# Patient Record
Sex: Female | Born: 1945 | Race: White | Hispanic: No | Marital: Married | Smoking: Former smoker
Health system: Southern US, Community
[De-identification: ages and names within clinical notes are randomized; demographics above are authoritative.]

## PROBLEM LIST (undated history)

## (undated) DIAGNOSIS — E785 Hyperlipidemia, unspecified: Secondary | ICD-10-CM

## (undated) DIAGNOSIS — N8110 Cystocele, unspecified: Secondary | ICD-10-CM

## (undated) DIAGNOSIS — Z860100 Personal history of colon polyps, unspecified: Secondary | ICD-10-CM

## (undated) DIAGNOSIS — Z87442 Personal history of urinary calculi: Secondary | ICD-10-CM

## (undated) DIAGNOSIS — G43109 Migraine with aura, not intractable, without status migrainosus: Secondary | ICD-10-CM

## (undated) DIAGNOSIS — Z8619 Personal history of other infectious and parasitic diseases: Secondary | ICD-10-CM

## (undated) DIAGNOSIS — H269 Unspecified cataract: Secondary | ICD-10-CM

## (undated) DIAGNOSIS — C801 Malignant (primary) neoplasm, unspecified: Secondary | ICD-10-CM

## (undated) DIAGNOSIS — F419 Anxiety disorder, unspecified: Secondary | ICD-10-CM

## (undated) DIAGNOSIS — K5790 Diverticulosis of intestine, part unspecified, without perforation or abscess without bleeding: Secondary | ICD-10-CM

## (undated) DIAGNOSIS — M8580 Other specified disorders of bone density and structure, unspecified site: Secondary | ICD-10-CM

## (undated) DIAGNOSIS — M810 Age-related osteoporosis without current pathological fracture: Secondary | ICD-10-CM

## (undated) DIAGNOSIS — T7840XA Allergy, unspecified, initial encounter: Secondary | ICD-10-CM

## (undated) HISTORY — PX: TUBAL LIGATION: SHX77

## (undated) HISTORY — PX: LUNG LOBECTOMY: SHX167

## (undated) HISTORY — DX: Hyperlipidemia, unspecified: E78.5

## (undated) HISTORY — DX: Personal history of other infectious and parasitic diseases: Z86.19

## (undated) HISTORY — DX: Migraine with aura, not intractable, without status migrainosus: G43.109

## (undated) HISTORY — PX: EYE SURGERY: SHX253

## (undated) HISTORY — DX: Anxiety disorder, unspecified: F41.9

## (undated) HISTORY — DX: Malignant (primary) neoplasm, unspecified: C80.1

## (undated) HISTORY — DX: Age-related osteoporosis without current pathological fracture: M81.0

## (undated) HISTORY — PX: ABDOMINAL HYSTERECTOMY: SHX81

## (undated) HISTORY — PX: FRACTURE SURGERY: SHX138

## (undated) HISTORY — DX: Other specified disorders of bone density and structure, unspecified site: M85.80

## (undated) HISTORY — PX: BREAST BIOPSY: SHX20

## (undated) HISTORY — DX: Unspecified cataract: H26.9

## (undated) HISTORY — DX: Allergy, unspecified, initial encounter: T78.40XA

---
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
## 1986-06-22 HISTORY — PX: OTHER SURGICAL HISTORY: SHX169

## 1997-12-13 ENCOUNTER — Encounter: Payer: Self-pay | Admitting: Family Medicine

## 2002-09-07 IMAGING — MG UNKNOWN MG STUDY
1 series · 5 of 5 positions shown · non-contrast
Comparison: none

REASON FOR EXAM: screening

Procedure: BILATERAL DIGITAL SCREENING MAMMOGRAPHY WITH CAD
 No dominant masses or pathologic clustered calcifications are demonstrated.
Parenchymal asymmetries are present and are stable.

[Series 5261: R CC · right · 5 of 5 slices shown]
[im 1/5]
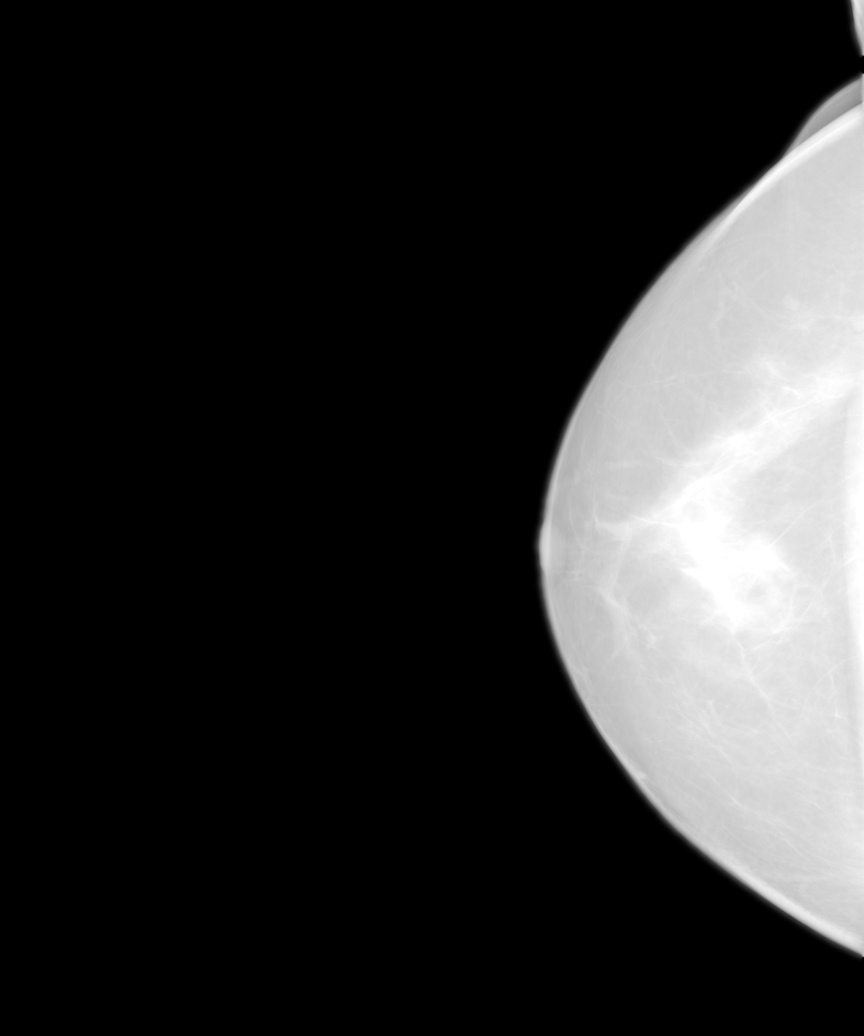
[im 2/5]
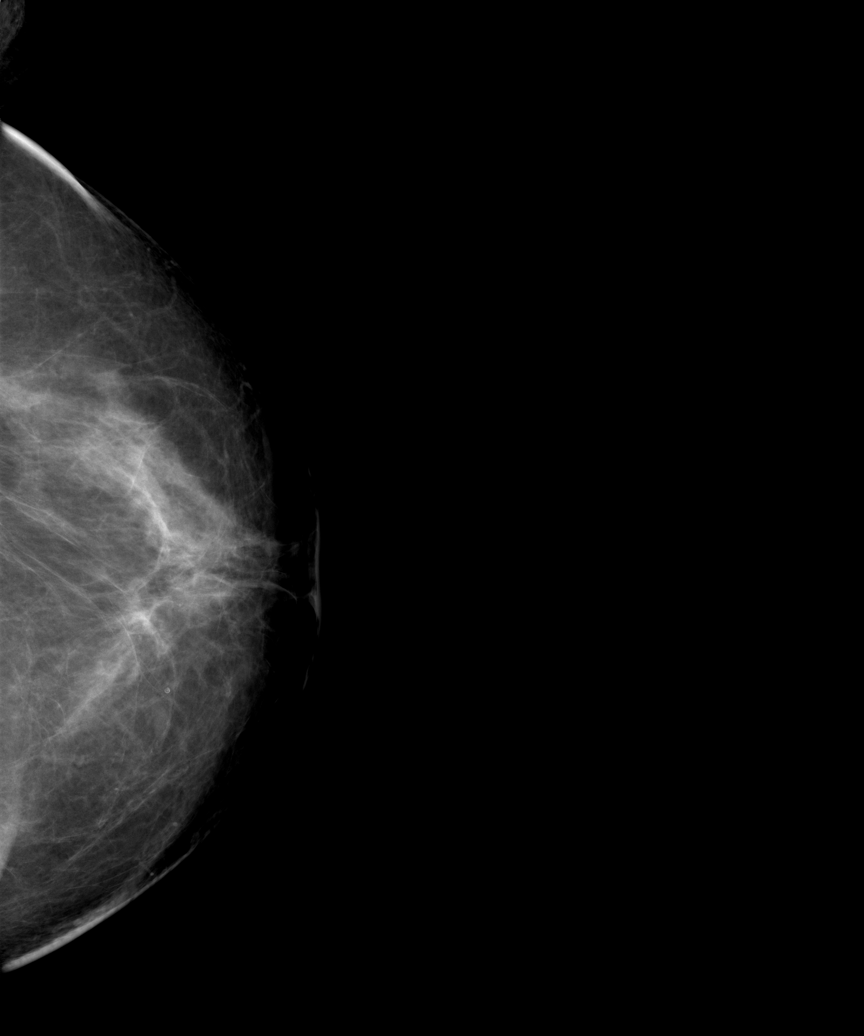
[im 3/5]
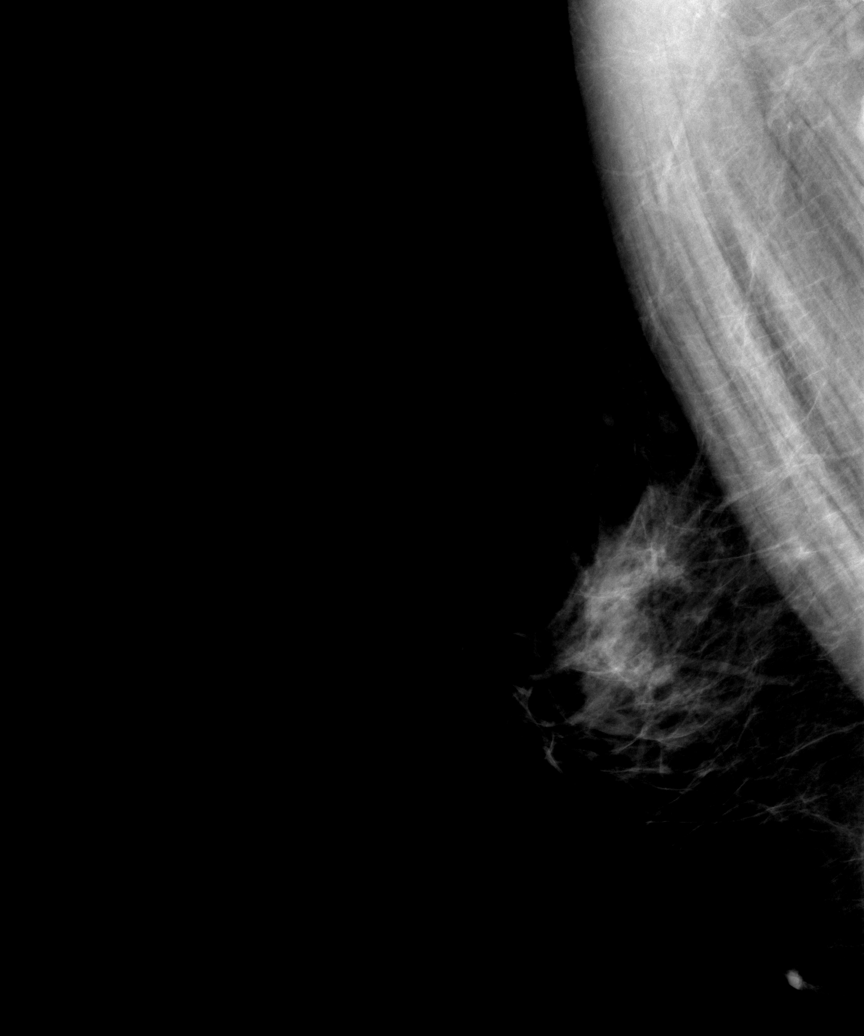
[im 4/5]
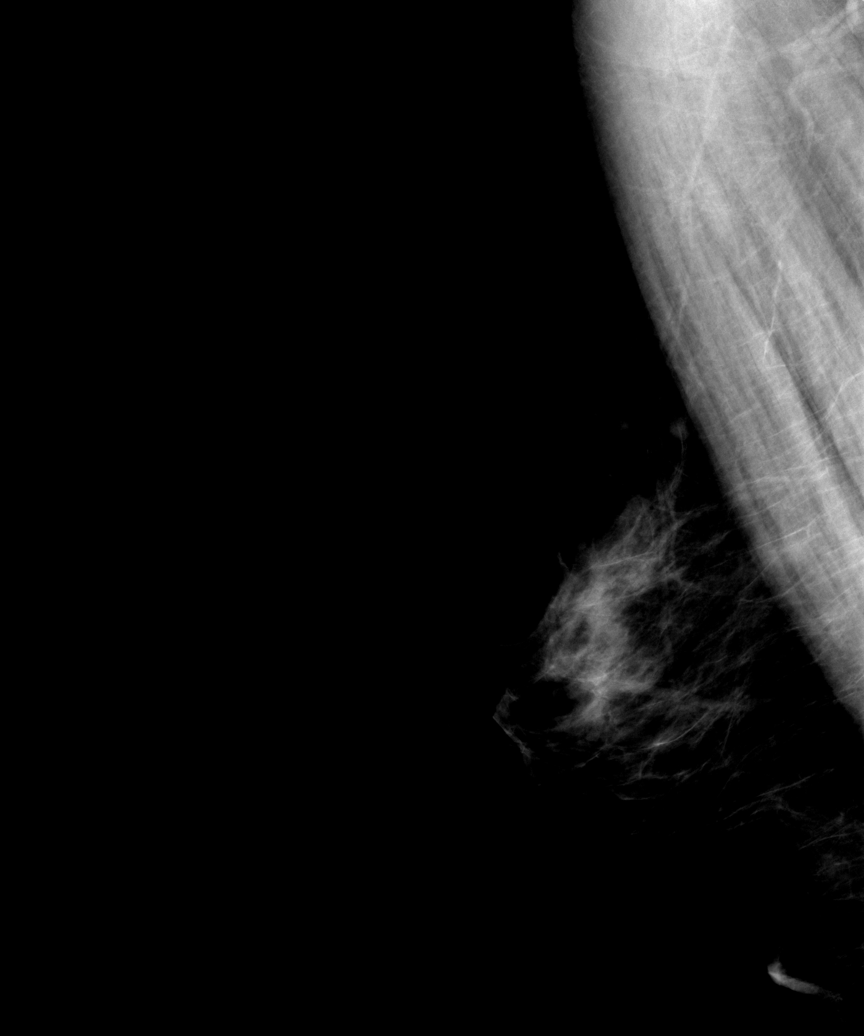
[im 5/5]
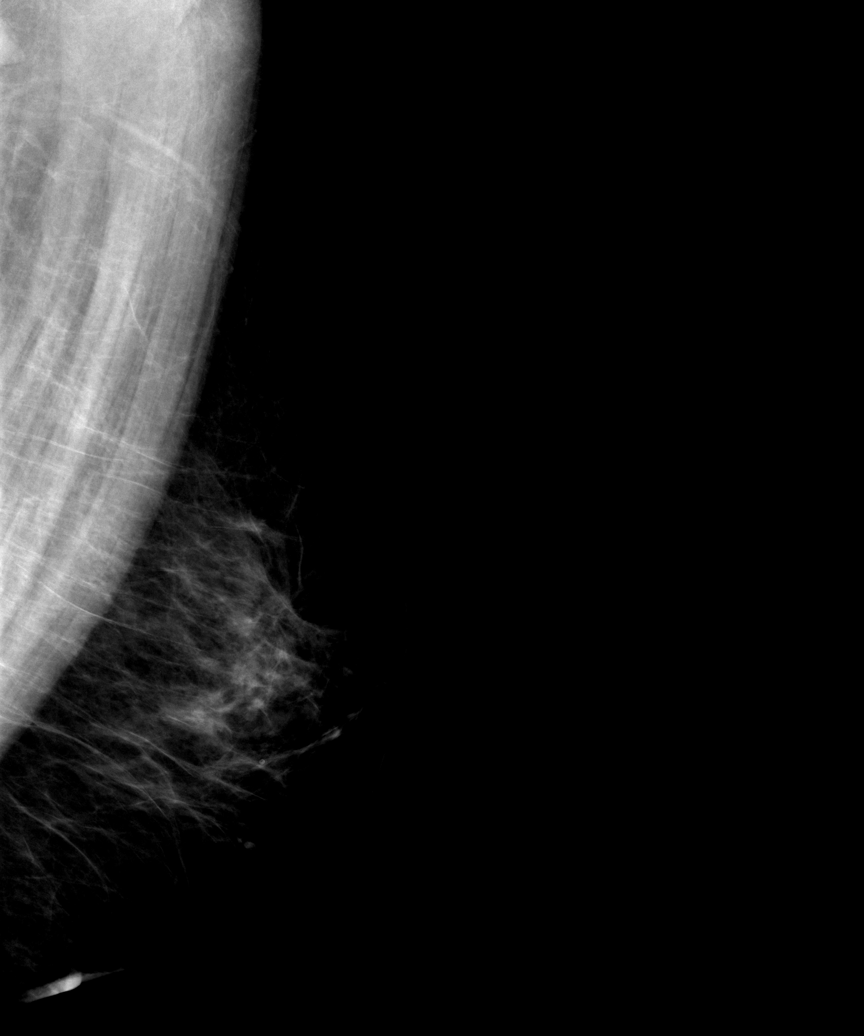

[5 of 5 positions shown; findings below may reference images not displayed]

IMPRESSION: Stable, benign exam. Yearly follow-up mammograms are suggested.

 BI-RADS: Category 2 - Benign Imaging Findings

 A NEGATIVE MAMMOGRAM REPORT DOES NOT PRECLUDE BIOPSY OR OTHER EVALUATION OF
CLINICALLY PALPABLE OR OTHERWISE SUSPICIOUS MASS OR LESION. BREAST CANCER
MAY NOT BE DETECTED BY MAMMOGRAPHY IN UP TO 10% OF CASES.

## 2003-11-23 IMAGING — MG UNKNOWN MG STUDY
1 series · 5 of 5 positions shown · non-contrast
Comparison: none

REASON FOR EXAM: Screening

Procedure: DIGITAL SCREENING MAMMOGRAM WITH CAD:
 No dominant masses or pathologic clustered calcifications demonstrated.
Nodularity noted in the RIGHT breast is stable. Parenchymal asymmetries are
stable.

[R CC · right · 5 of 5 slices shown]
[im 1/5]
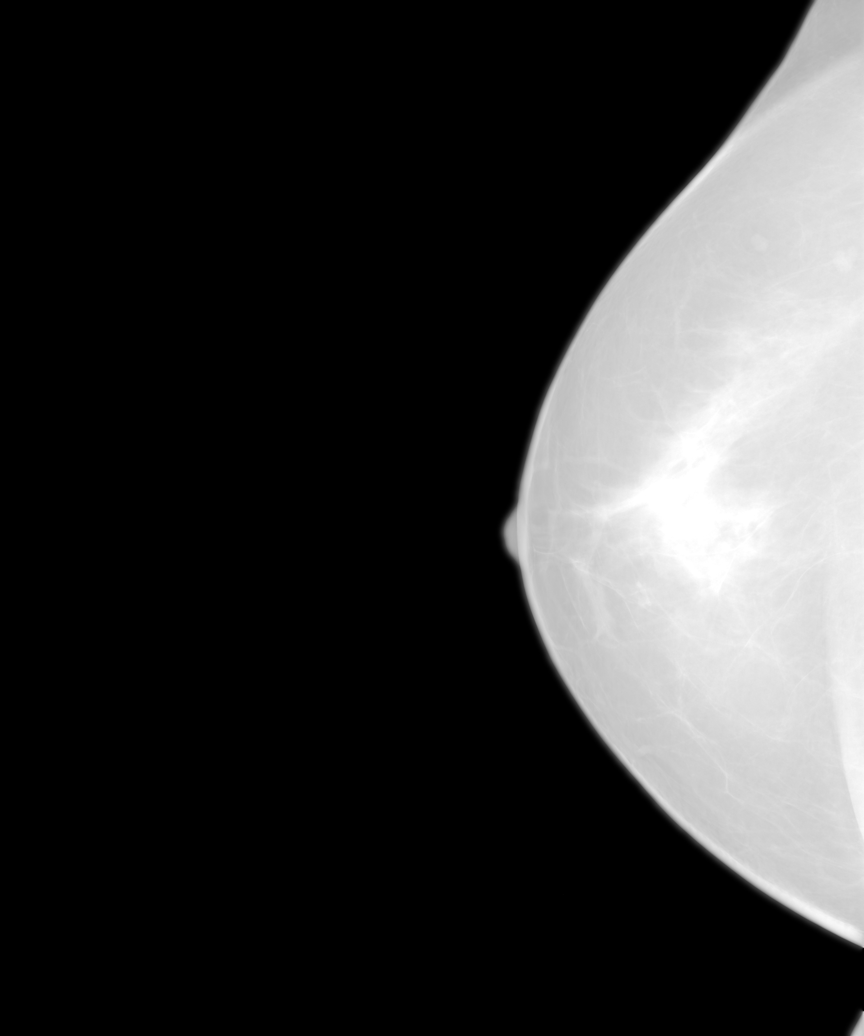
[im 2/5]
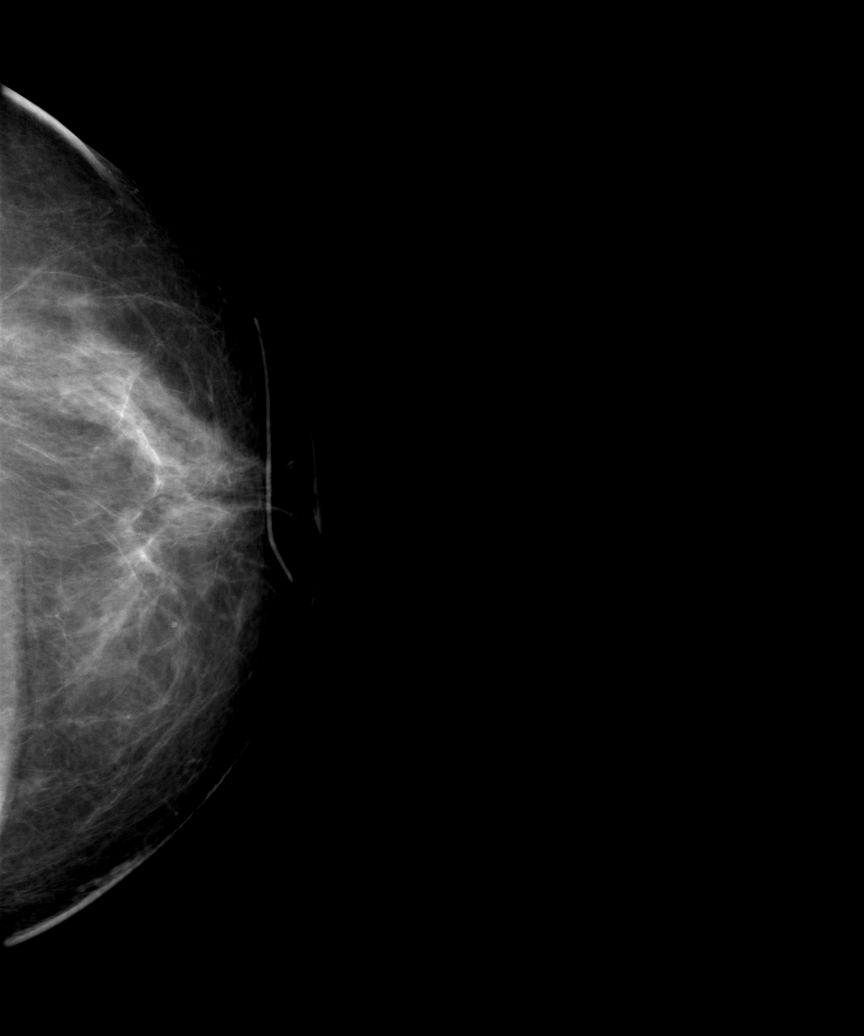
[im 3/5]
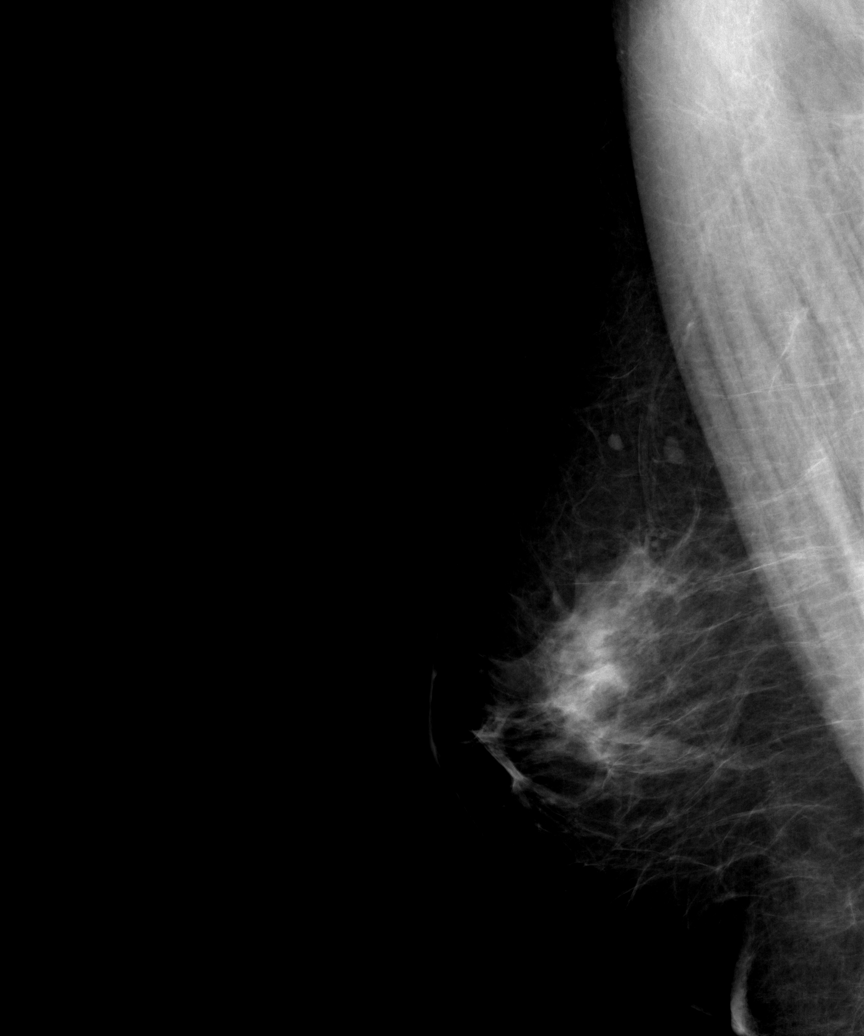
[im 4/5]
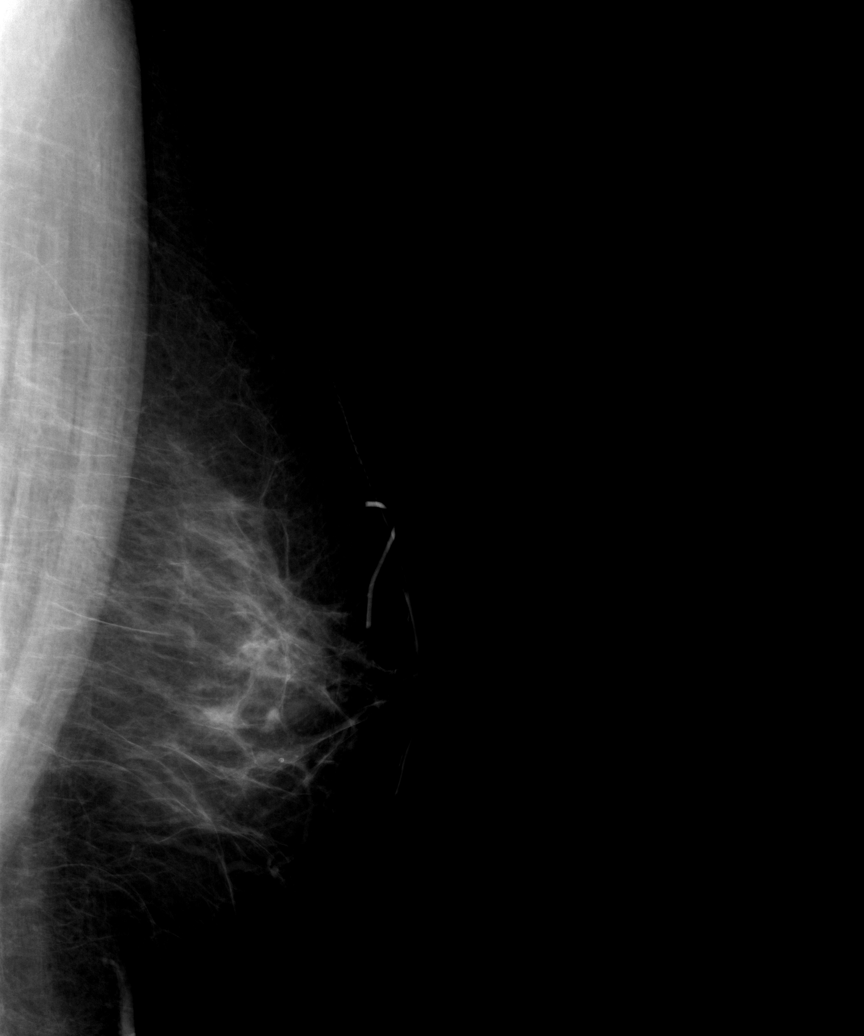
[im 5/5]
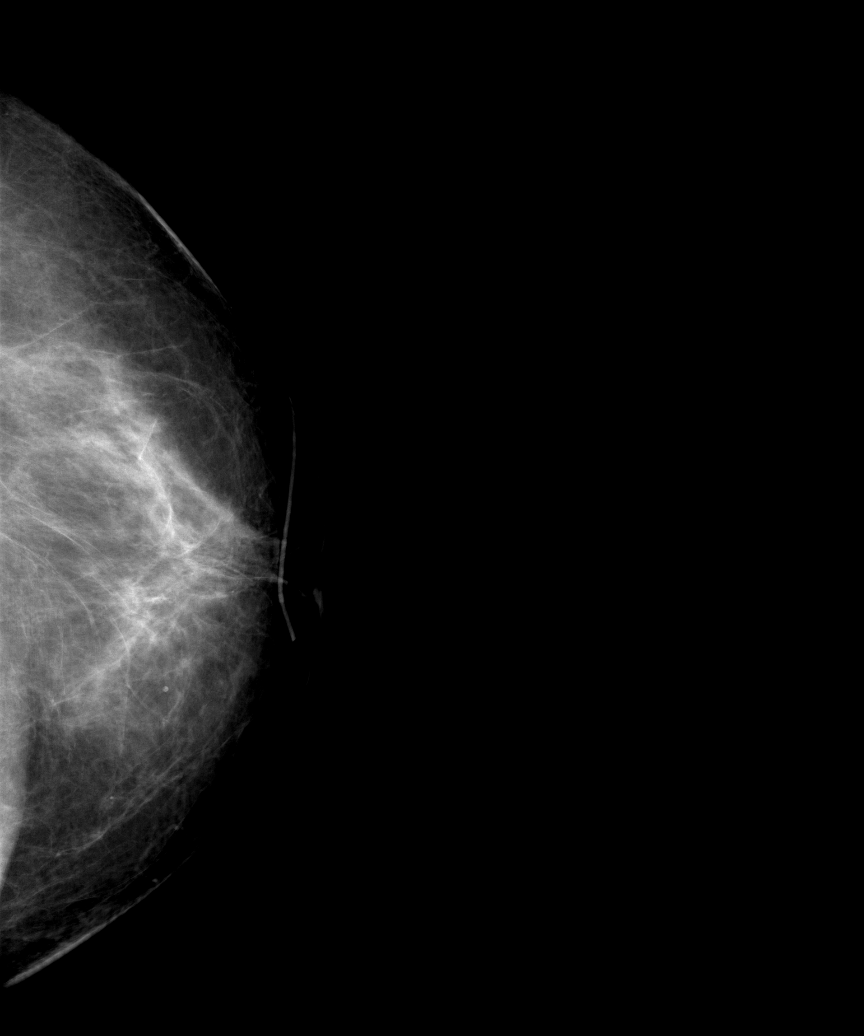

[5 of 5 positions shown; findings below may reference images not displayed]

IMPRESSION: 1)Stable, benign exam. Yearly follow-up mammogram suggested.

 BI-RADS: Category 2-Benign Finding

 A NEGATIVE MAMMOGRAM REPORT DOES NOT PRECLUDE BIOPSY OR OTHER EVALUATION OF
A CLINICALLY PALPABLE OR OTHERWISE SUSPICIOUS MASS OR LESION. BREAST CANCER
MAY NOT BE DETECTED BY MAMMOGRAPHY IN UP TO 10% OF CASES.

## 2004-09-04 ENCOUNTER — Ambulatory Visit: Payer: Self-pay | Admitting: Family Medicine

## 2005-02-12 ENCOUNTER — Ambulatory Visit: Payer: Self-pay | Admitting: Family Medicine

## 2005-02-12 LAB — CONVERTED CEMR LAB: TSH: 1.38 microintl units/mL

## 2005-02-16 ENCOUNTER — Ambulatory Visit: Payer: Self-pay | Admitting: Family Medicine

## 2005-02-16 ENCOUNTER — Other Ambulatory Visit: Admission: RE | Admit: 2005-02-16 | Discharge: 2005-02-16 | Payer: Self-pay | Admitting: Family Medicine

## 2005-02-16 ENCOUNTER — Encounter: Payer: Self-pay | Admitting: Family Medicine

## 2005-02-16 LAB — CONVERTED CEMR LAB: Pap Smear: NORMAL

## 2005-03-10 ENCOUNTER — Ambulatory Visit: Payer: Self-pay | Admitting: Family Medicine

## 2005-03-31 ENCOUNTER — Ambulatory Visit: Payer: Self-pay | Admitting: Family Medicine

## 2005-04-07 ENCOUNTER — Ambulatory Visit: Payer: Self-pay | Admitting: Family Medicine

## 2005-04-07 IMAGING — MG UNKNOWN MG STUDY
1 series · 4 of 4 positions shown · non-contrast
Comparison: none

REASON FOR EXAM: SCREENING MAMMO
COMMENTS:

[R CC · right · 4 of 4 slices shown]
[im 1/4]
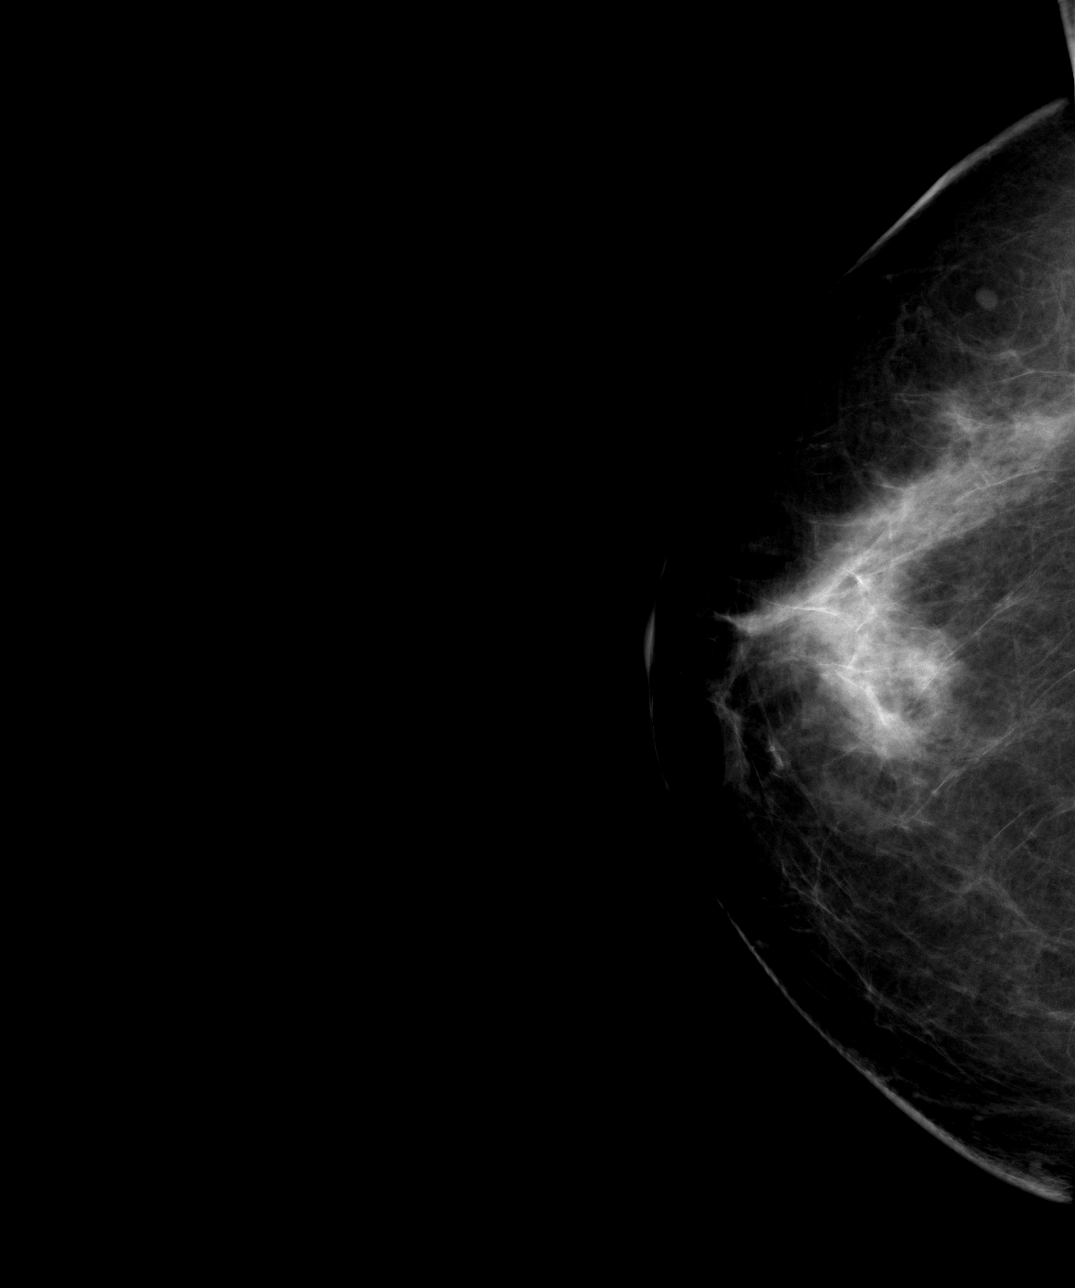
[im 2/4]
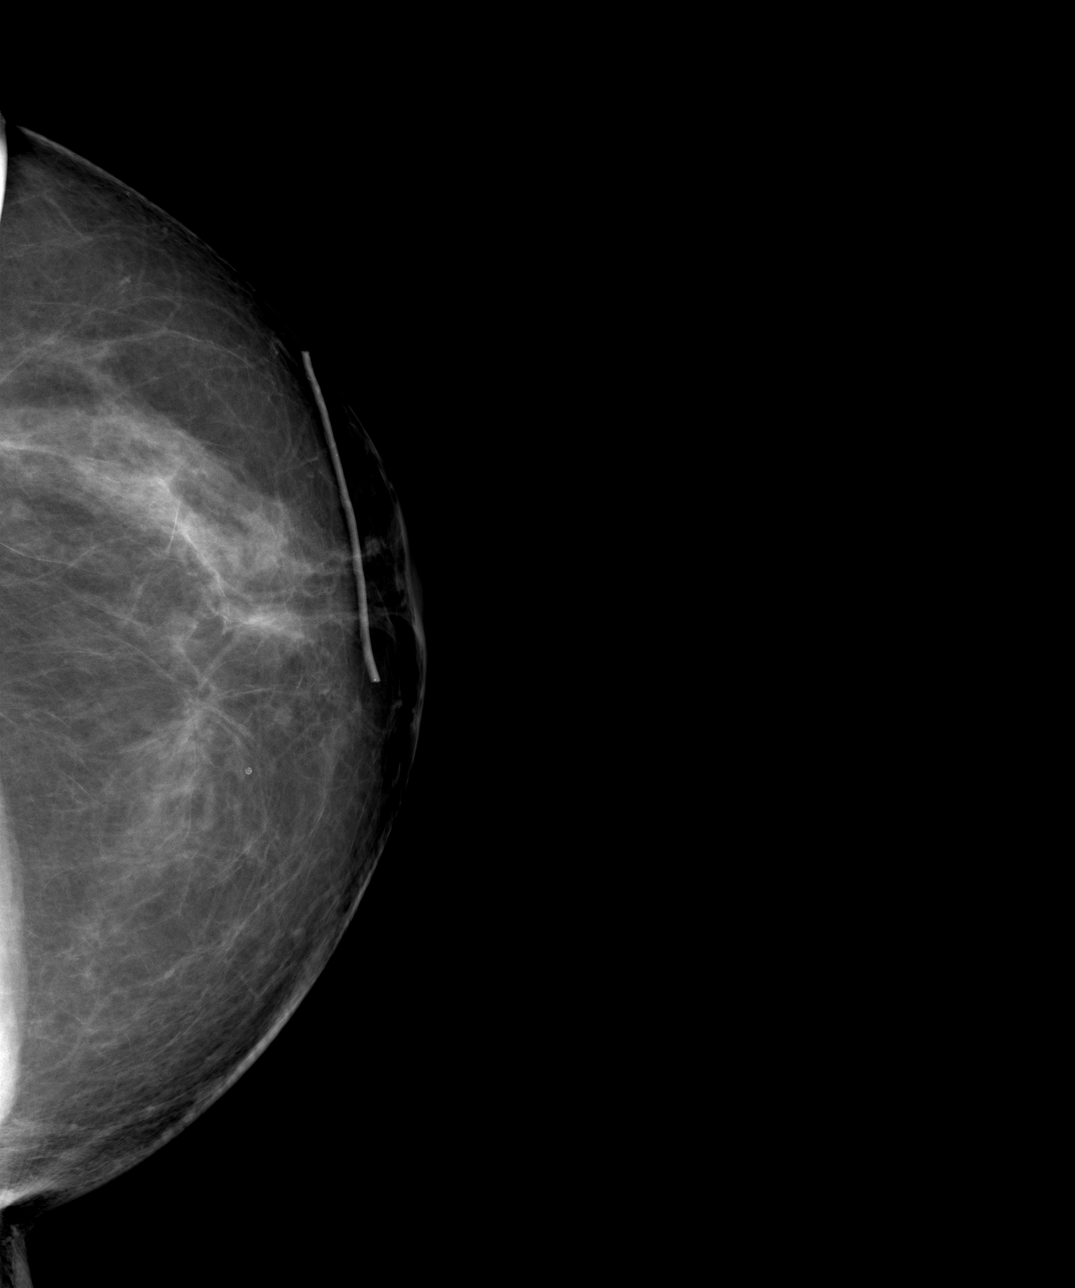
[im 3/4]
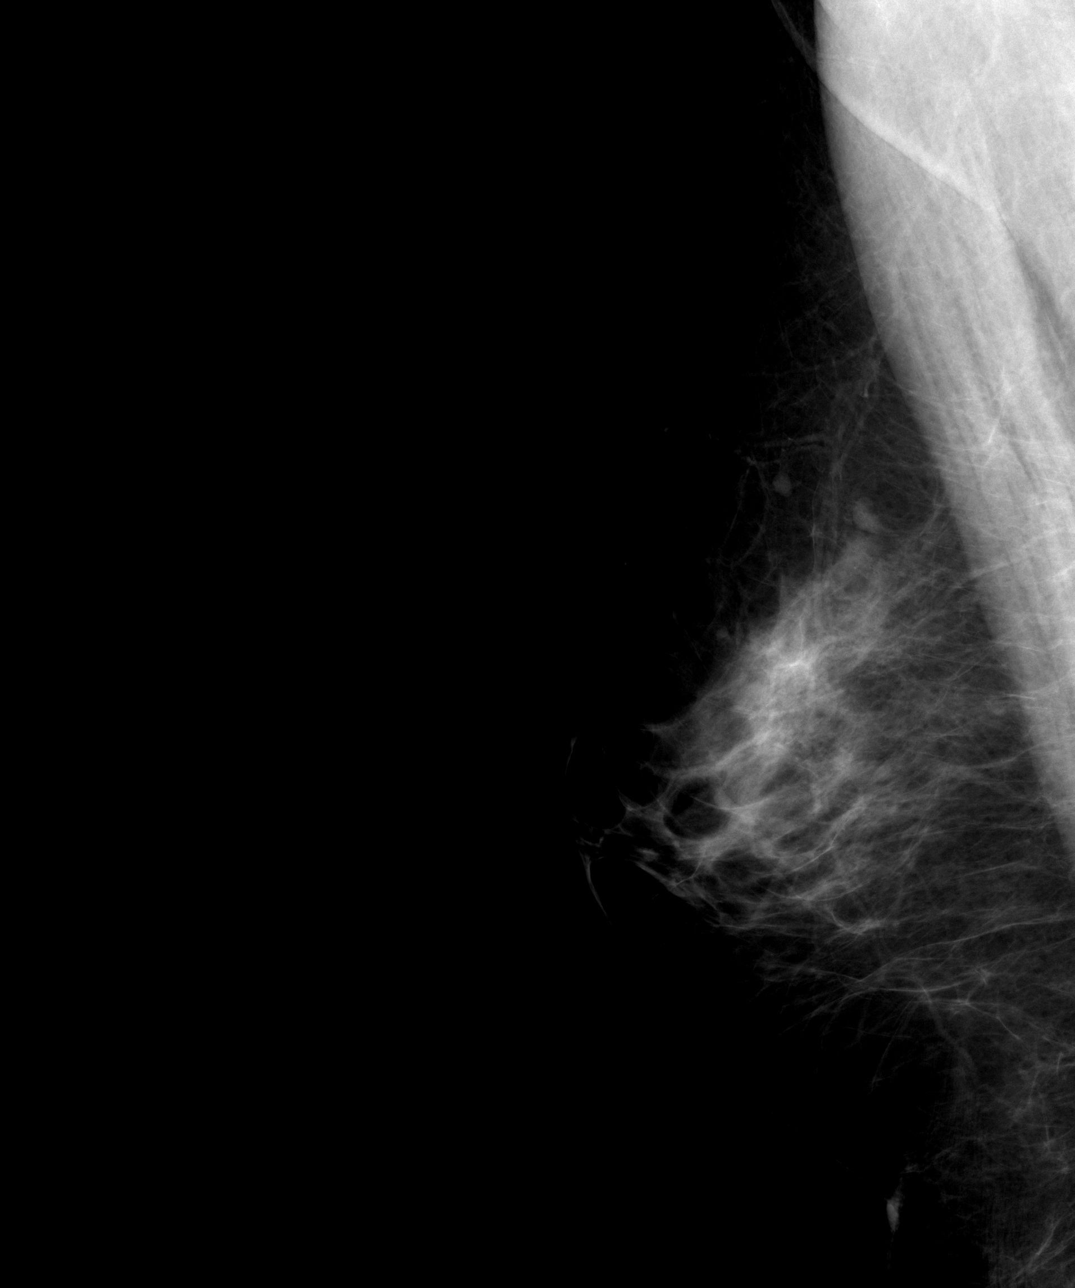
[im 4/4]
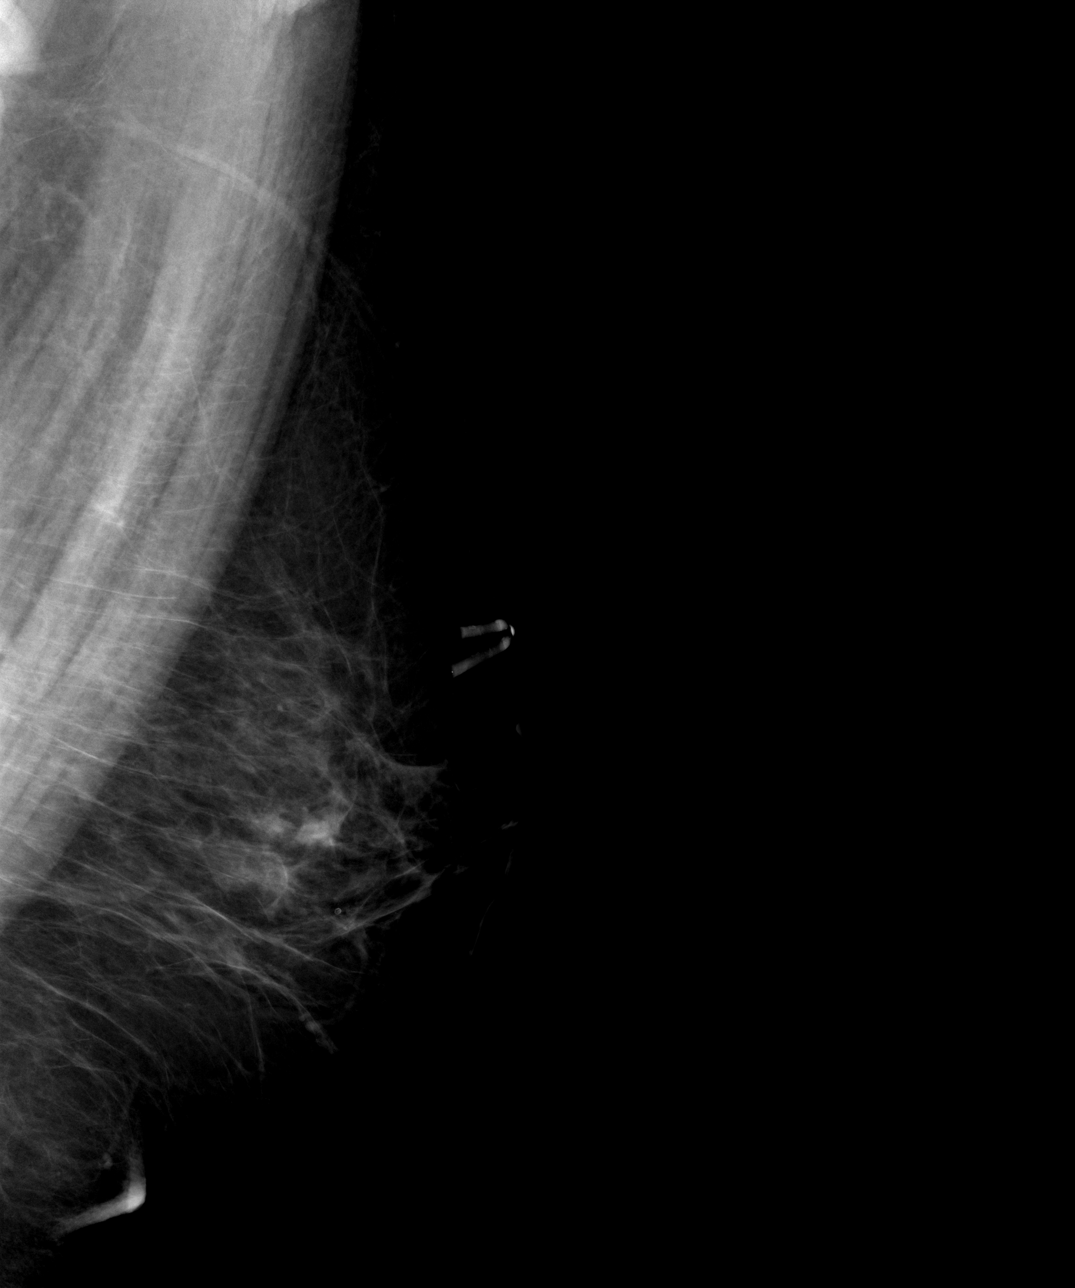

[4 of 4 positions shown; findings below may reference images not displayed]

PROCEDURE:     MAM - MAM DGTL SCREENING MAMMO W/CAD  - [DATE]  [DATE]

RESULT:     Comparison is made to the prior digital study of [DATE] and a
film screen study of [DATE].

The breasts exhibit a moderately dense parenchymal pattern.  Somewhat more
dense parenchymal tissue is present on the RIGHT than on the LEFT.  A biopsy
scar marker has been placed in the 12 o'clock position on the LEFT.  I see
no malignant-appearing grouping of microcalcification.
IMPRESSION: I see no finding suspicious for malignancy.

BI-RADS: Category 2 - Benign Imaging Findings.

RECOMMENDATION: Please continue to encourage yearly mammographic followup.

A NEGATIVE MAMMOGRAM REPORT DOES NOT PRECLUDE BIOPSY OR OTHER EVALUATION OF
A CLINICALLY PALPABLE OR OTHERWISE SUSPICIOUS MASS OR LESION.  BREAST CANCER
MAY NOT BE DETECTED BY MAMMOGRAPHY IN UP TO 10% OF CASES.

## 2005-07-15 ENCOUNTER — Ambulatory Visit: Payer: Self-pay | Admitting: Family Medicine

## 2005-07-17 ENCOUNTER — Ambulatory Visit: Payer: Self-pay | Admitting: Family Medicine

## 2005-11-03 ENCOUNTER — Ambulatory Visit: Payer: Self-pay | Admitting: Family Medicine

## 2005-12-17 ENCOUNTER — Ambulatory Visit: Payer: Self-pay | Admitting: Family Medicine

## 2005-12-21 ENCOUNTER — Ambulatory Visit: Payer: Self-pay | Admitting: Family Medicine

## 2006-05-28 ENCOUNTER — Ambulatory Visit: Payer: Self-pay | Admitting: Family Medicine

## 2006-05-28 LAB — CONVERTED CEMR LAB: Blood Glucose, Fasting: 101 mg/dL

## 2006-06-01 ENCOUNTER — Ambulatory Visit: Payer: Self-pay | Admitting: Family Medicine

## 2006-06-01 ENCOUNTER — Other Ambulatory Visit: Admission: RE | Admit: 2006-06-01 | Discharge: 2006-06-01 | Payer: Self-pay | Admitting: Family Medicine

## 2006-06-01 ENCOUNTER — Encounter: Payer: Self-pay | Admitting: Family Medicine

## 2006-06-11 ENCOUNTER — Ambulatory Visit: Payer: Self-pay | Admitting: Family Medicine

## 2006-06-24 ENCOUNTER — Ambulatory Visit: Payer: Self-pay | Admitting: Family Medicine

## 2006-07-09 ENCOUNTER — Ambulatory Visit: Payer: Self-pay | Admitting: Family Medicine

## 2006-07-09 IMAGING — MG UNKNOWN MG STUDY
1 series · 4 of 4 positions shown · non-contrast
Comparison: none

REASON FOR EXAM: Screening mammo
COMMENTS:

[R CC · right · 4 of 4 slices shown]
[im 1/4]
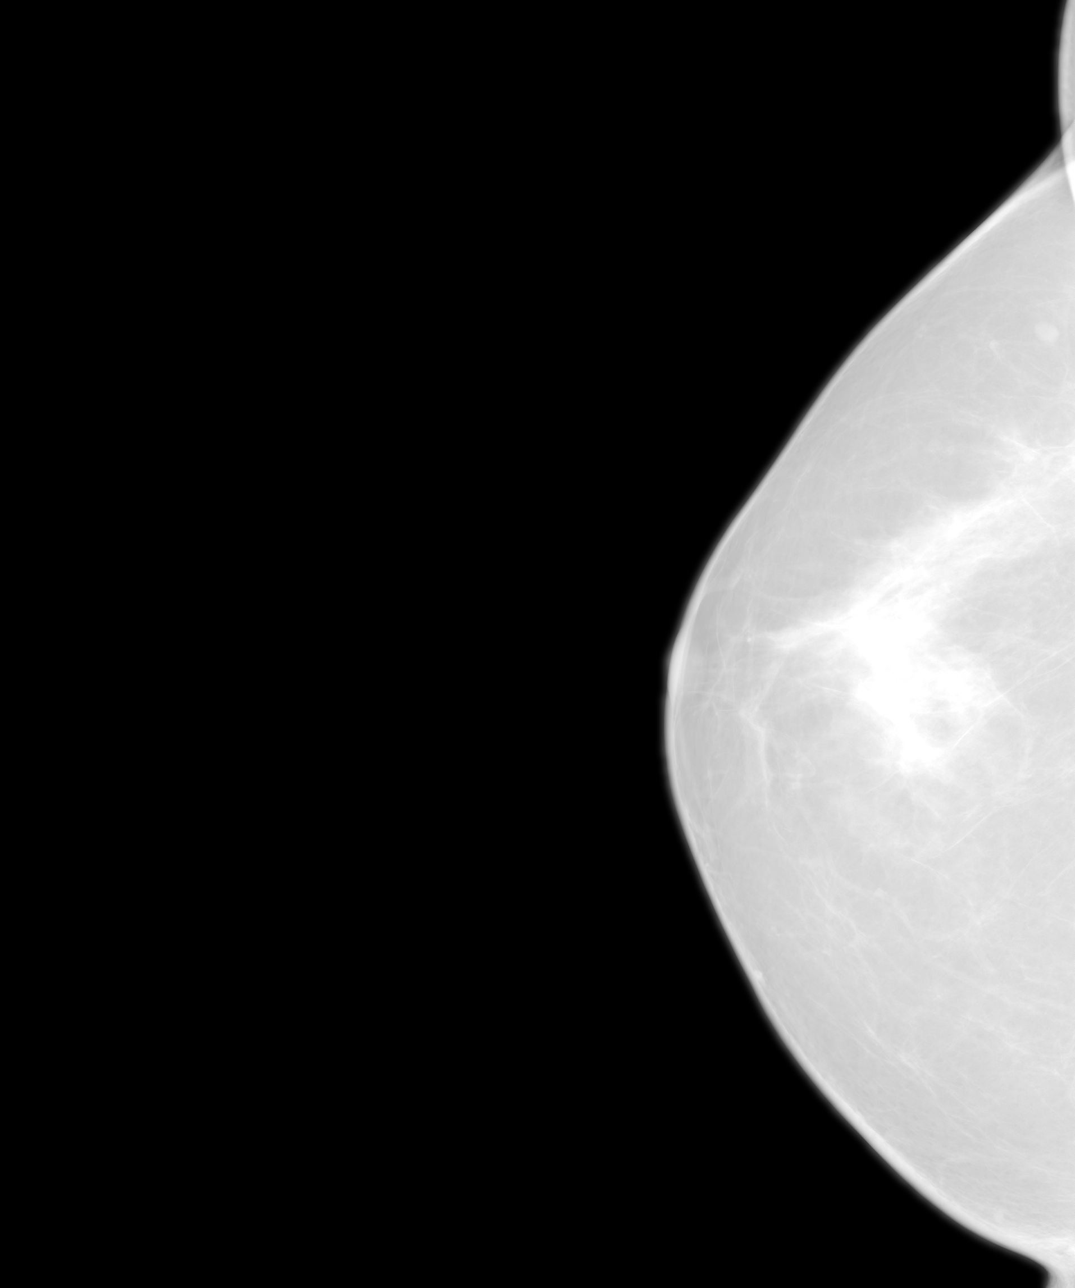
[im 2/4]
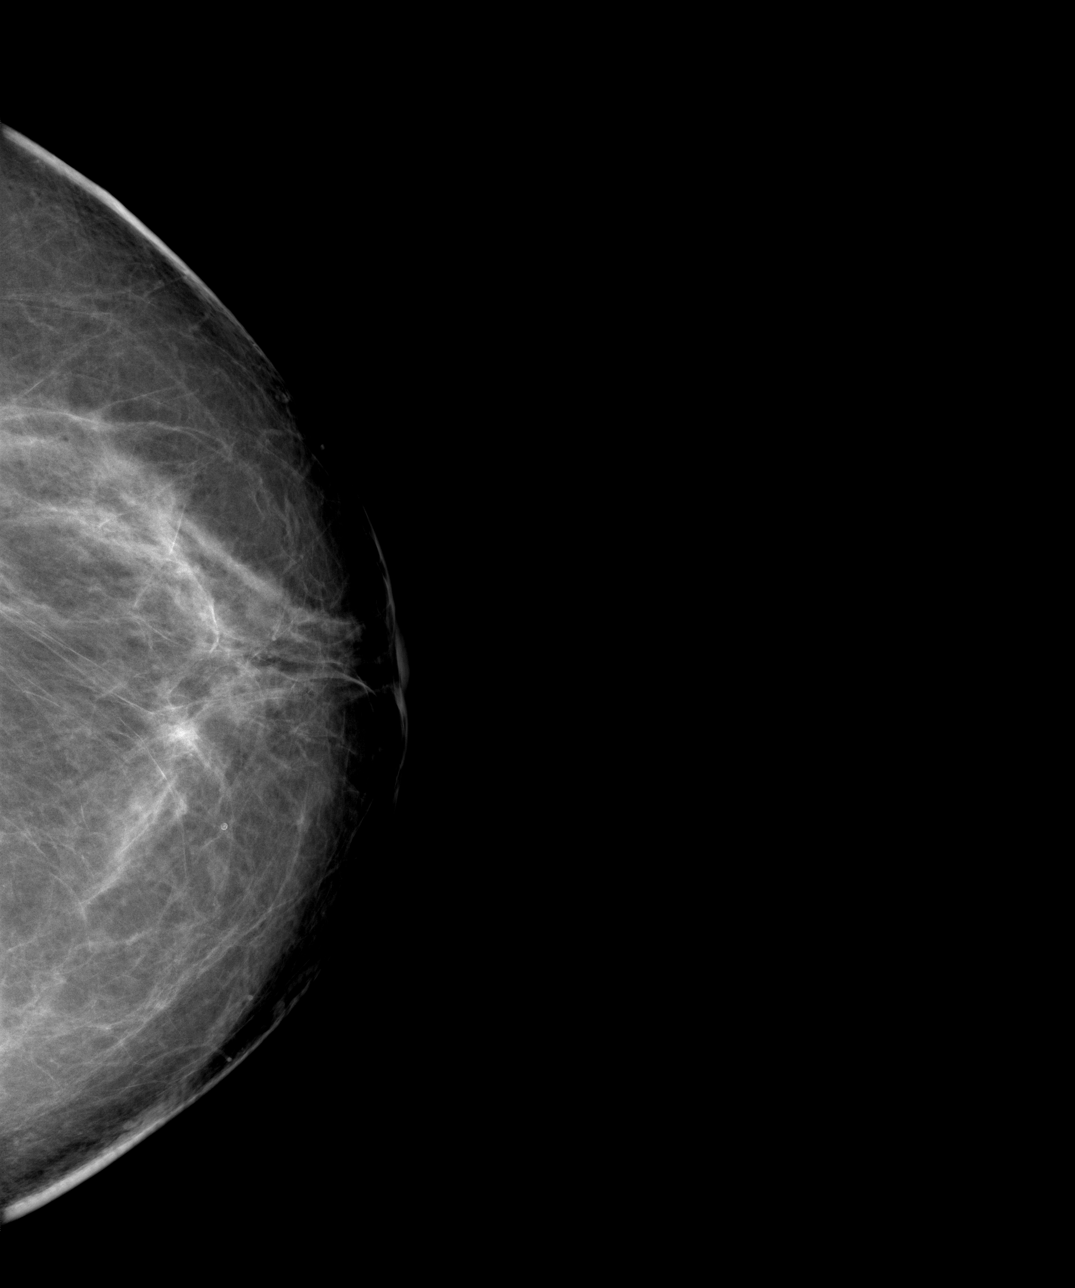
[im 3/4]
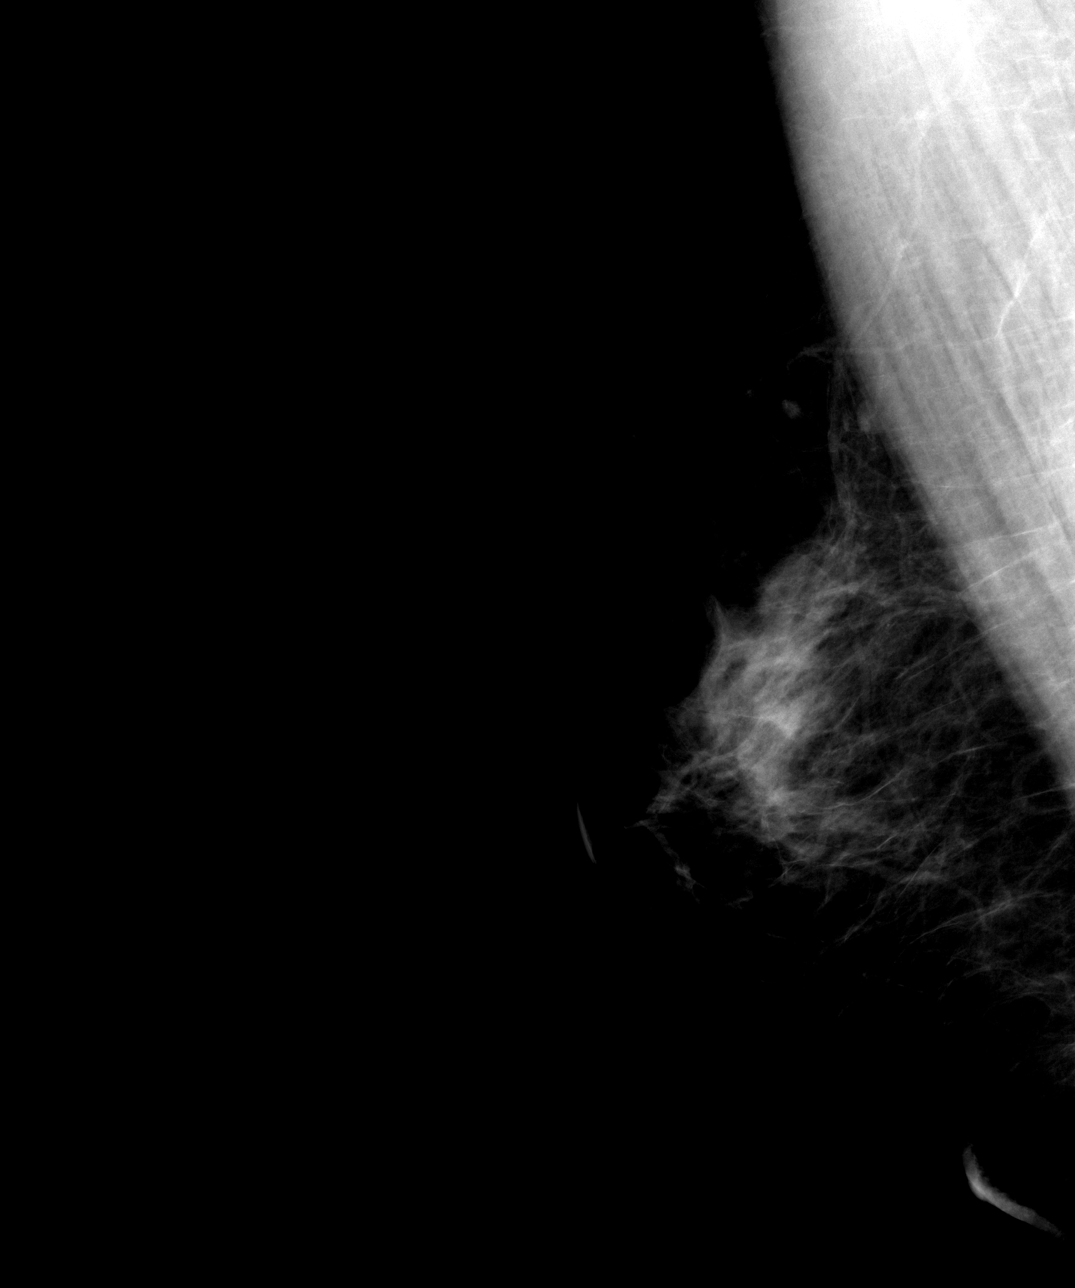
[im 4/4]
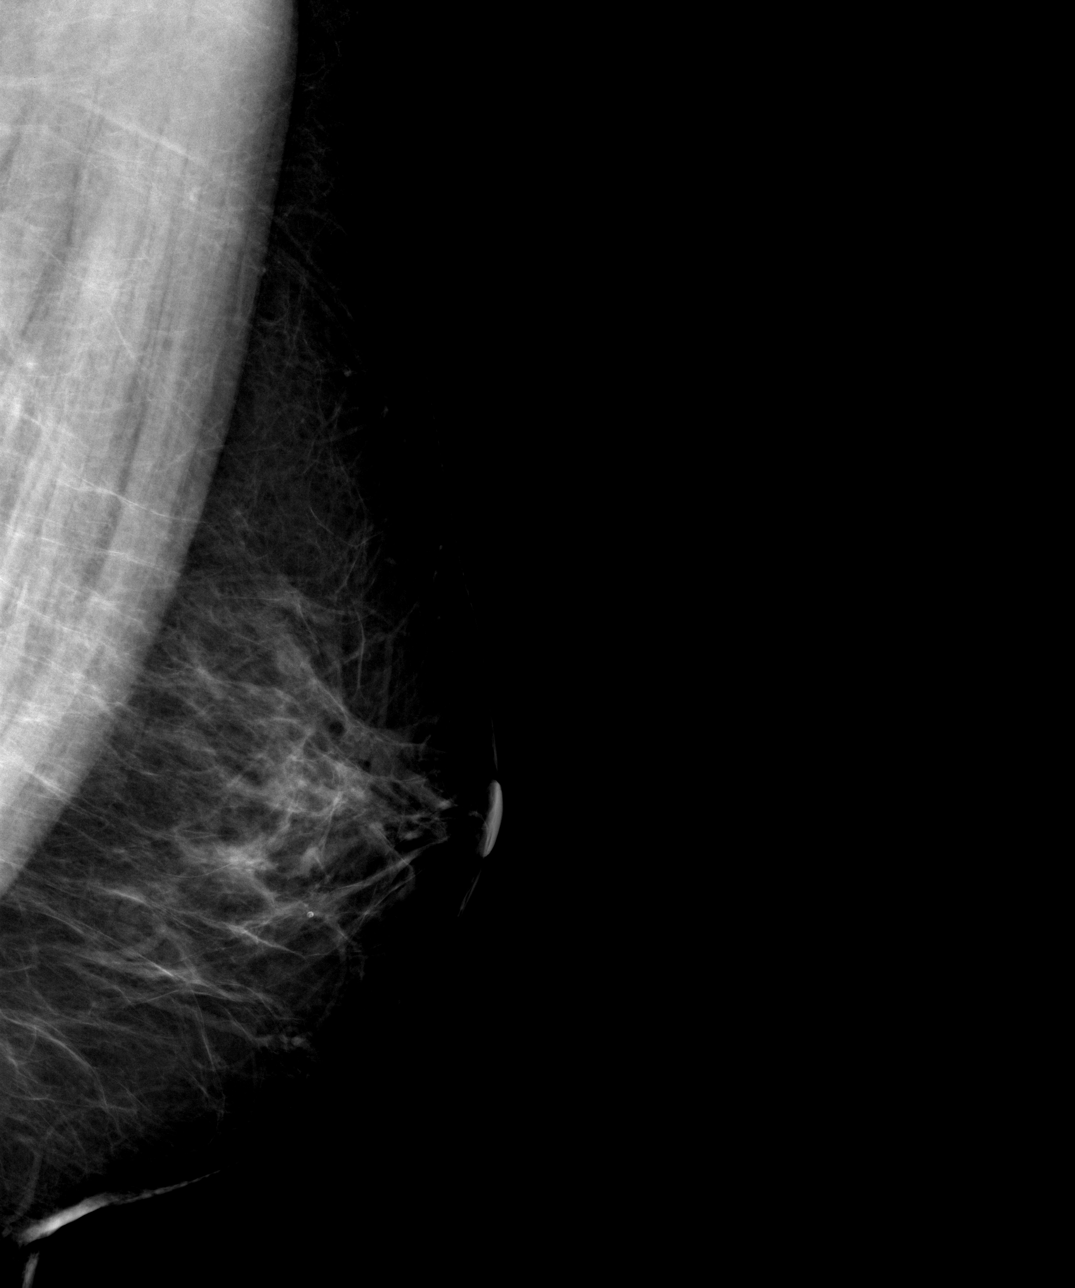

[4 of 4 positions shown; findings below may reference images not displayed]

PROCEDURE:     MAM - MAM DGTL SCREENING MAMMO W/CAD  - [DATE]  [DATE]

RESULT:     The study was presented for evaluation on [DATE] and compared
to previous studies dated [DATE], [DATE], and [DATE].
The breasts demonstrate mild heterogeneous parenchymal pattern.  A
spiculated density projects within the central portion of the LEFT breast
appreciated primarily on the CC view.  There does not appear to be a
definitive MLO correlate though further evaluation with magnification
compression imaging is recommended.  No further radiographic evidence to
suggest malignancy is appreciated.
IMPRESSION: Asymmetric density LEFT breast. Additional imaging is recommended.

Corresponds to BI-RADS 0 - Needs Additional Imaging Evaluation.

A NEGATIVE MAMMOGRAM REPORT DOES NOT PRECLUDE BIOPSY OR OTHER EVALUATION OF
A CLINICALLY PALPABLE OR OTHERWISE SUSPICIOUS MASS OR LESION.  BREAST CANCER
MAY NOT BE DETECTED BY MAMMOGRAPHY IN UP TO 10% OF CASES.

## 2006-07-19 ENCOUNTER — Ambulatory Visit: Payer: Self-pay | Admitting: Family Medicine

## 2006-07-19 IMAGING — MG UNKNOWN MG STUDY
1 series · 3 of 3 positions shown · non-contrast
Comparison: none

REASON FOR EXAM: left breast asymmetric density
COMMENTS:

[L ML · left · 3 of 3 slices shown]
[im 1/3]
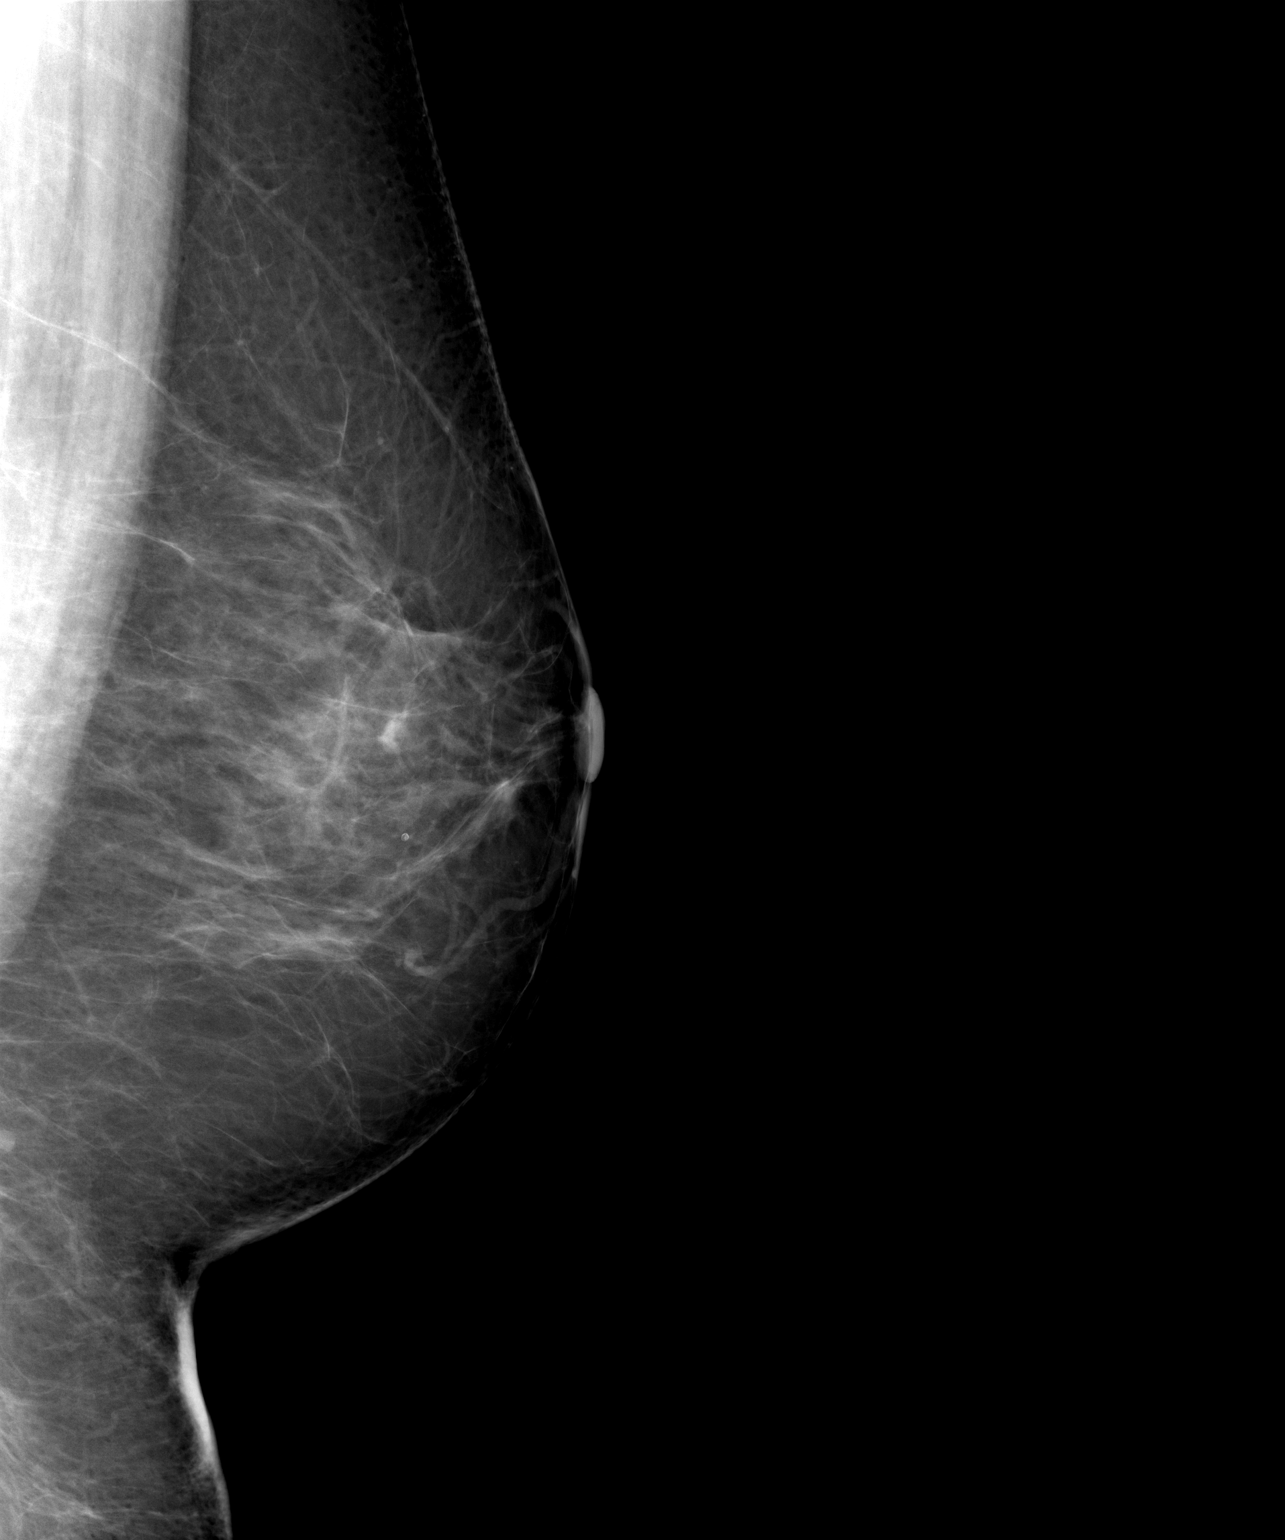
[im 2/3]
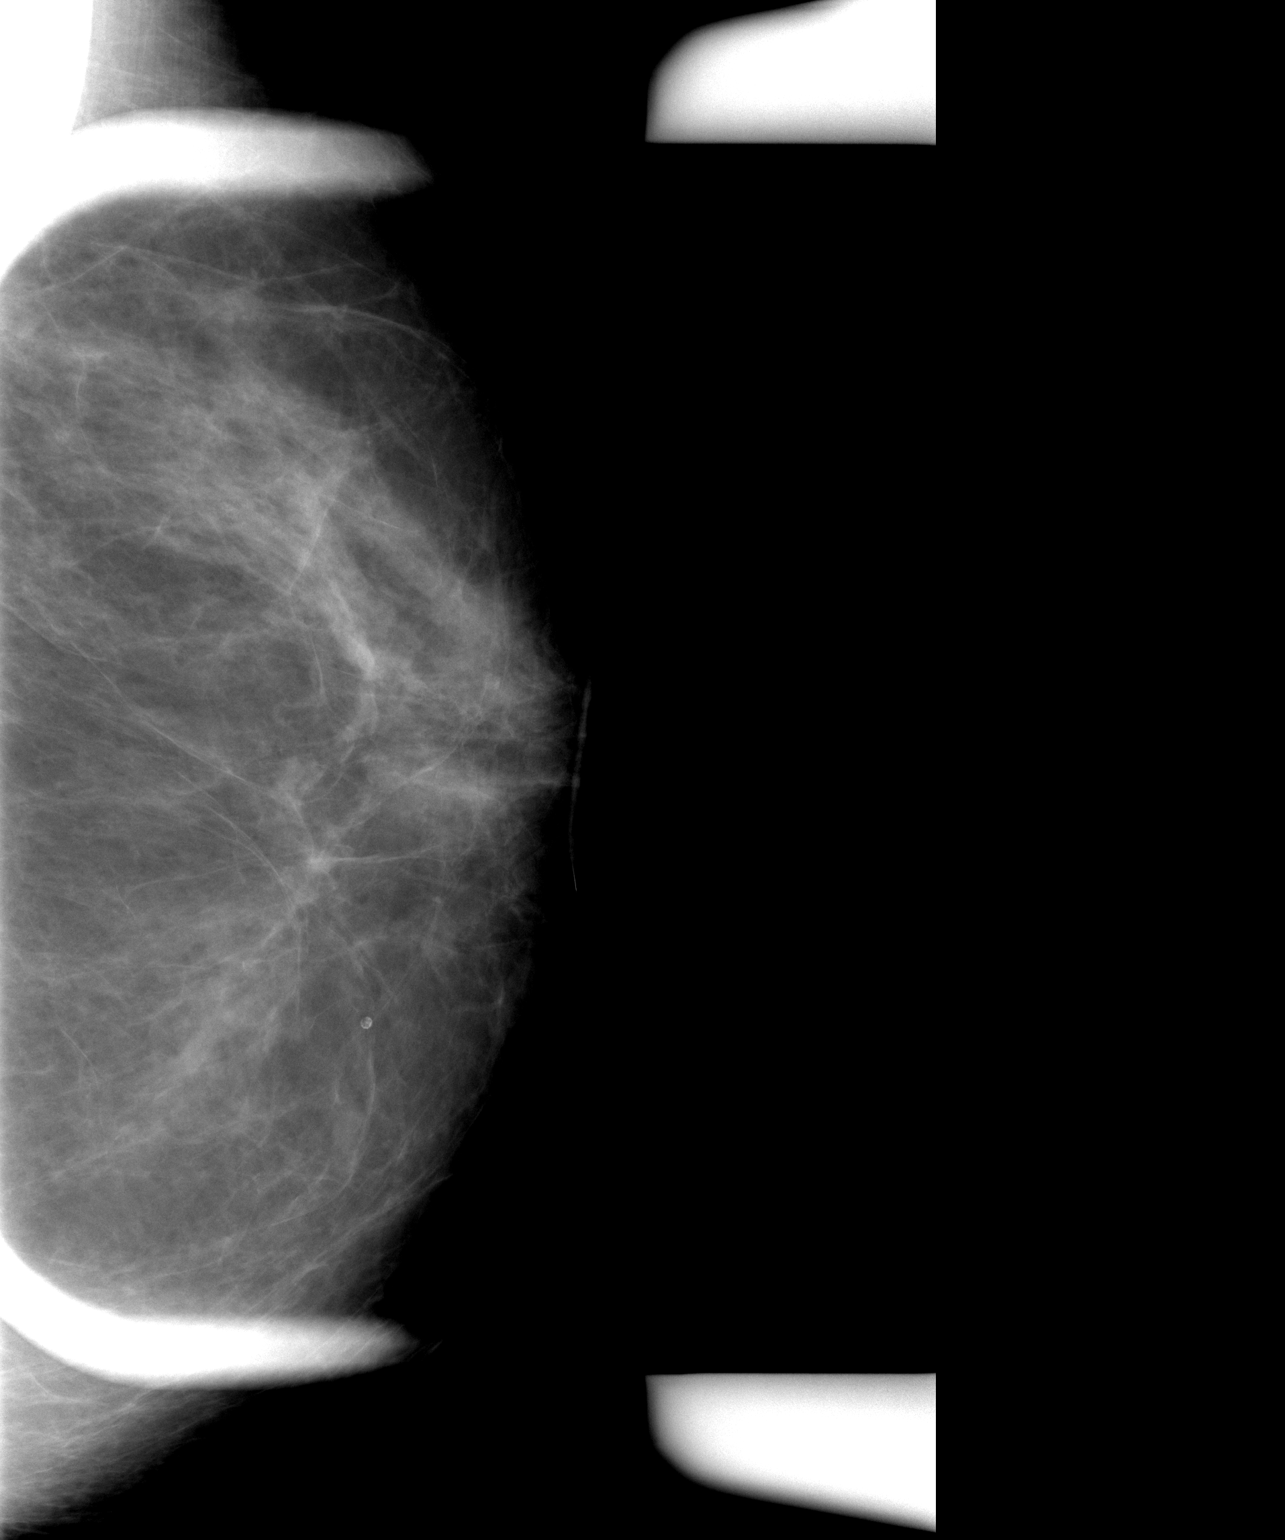
[im 3/3]
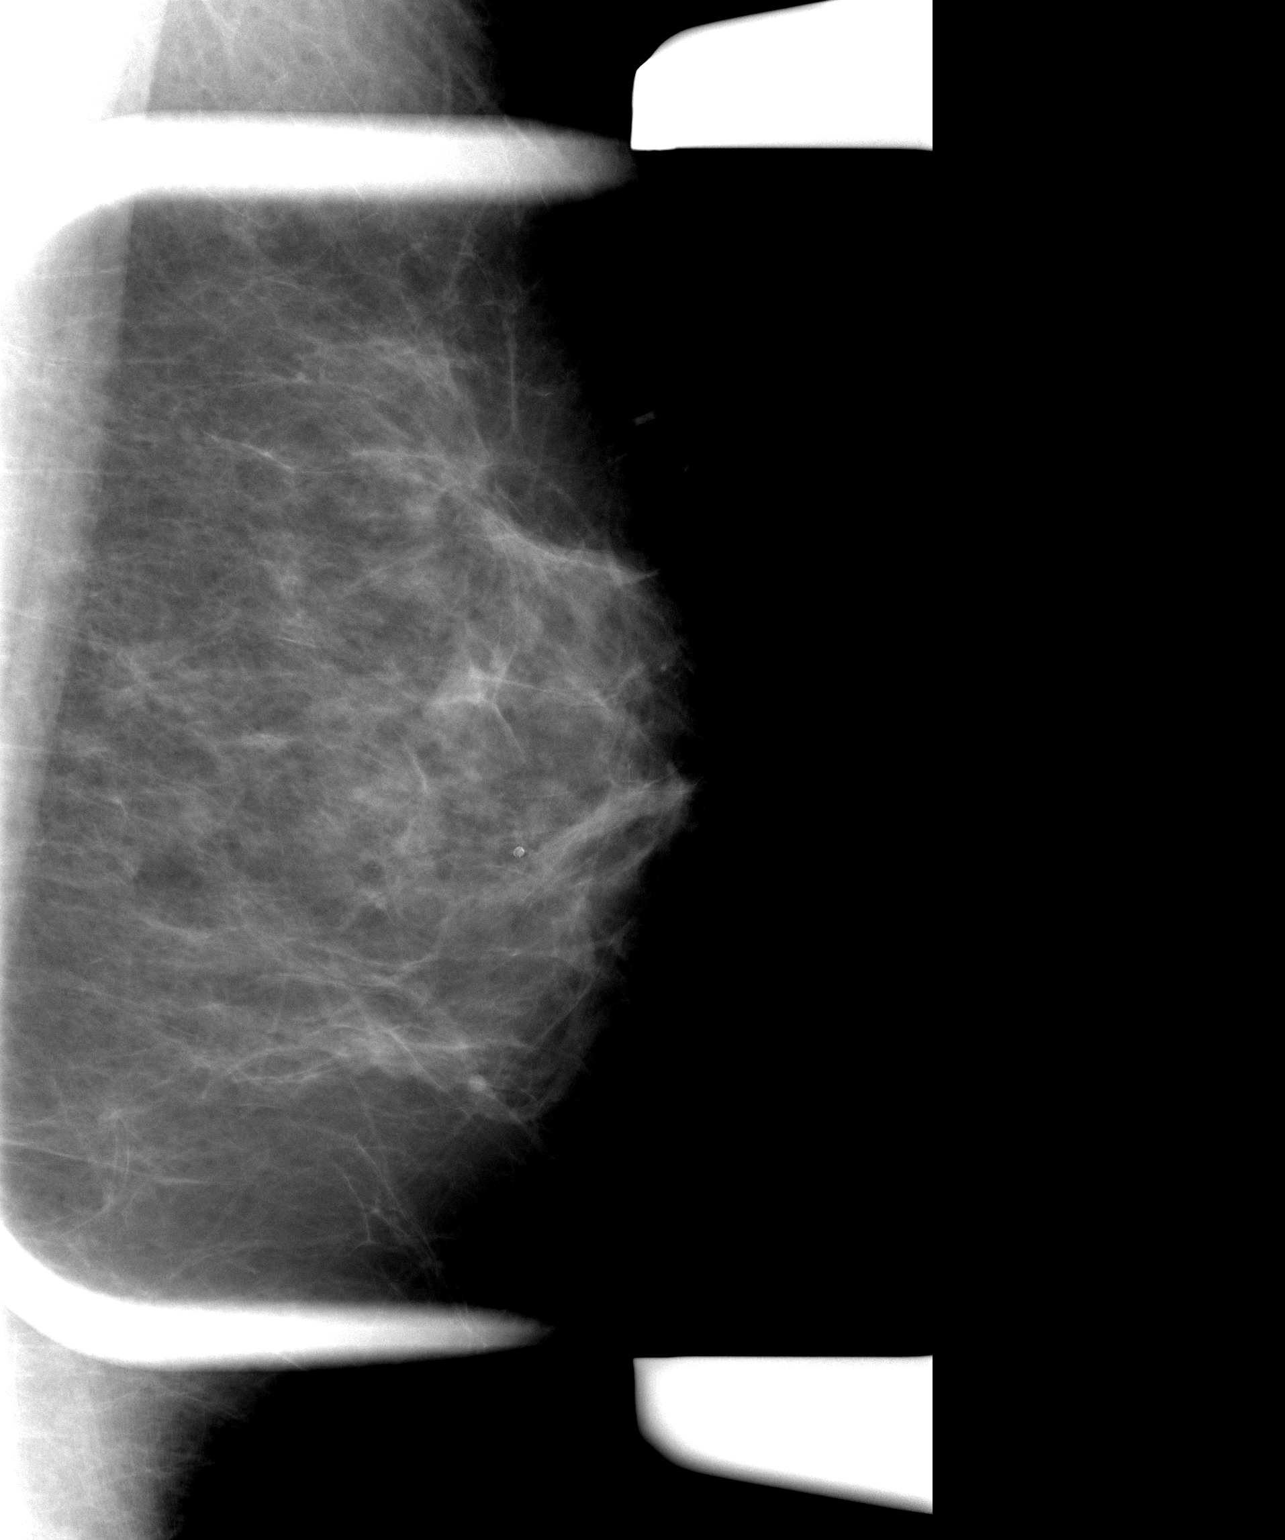

[3 of 3 positions shown; findings below may reference images not displayed]

PROCEDURE:     MAM - MAM DGTL ADD VW LT  SCR  - [DATE] [DATE]

RESULT:     The previously described asymmetric density within the LEFT
breast was further evaluated with magnification compression imaging.  This
area spreads and blends with the normal breast parenchyma status post
compression indicative of a benign finding.
IMPRESSION: 1)Benign findings, LEFT breast.

BI-RADS: Category 2 - Benign Finding.

A NEGATIVE MAMMOGRAM REPORT DOES NOT PRECLUDE BIOPSY OR OTHER EVALUATION OF
A CLINICALLY PALPABLE OR OTHERWISE SUSPICIOUS MASS OR LESION. BREAST CANCER
MAY NOT BE DETECTED BY MAMMOGRAPHY IN UP TO 10% OF CASES.

## 2007-04-27 ENCOUNTER — Encounter: Payer: Self-pay | Admitting: Family Medicine

## 2007-04-27 DIAGNOSIS — E785 Hyperlipidemia, unspecified: Secondary | ICD-10-CM | POA: Insufficient documentation

## 2007-04-27 DIAGNOSIS — T7840XA Allergy, unspecified, initial encounter: Secondary | ICD-10-CM | POA: Insufficient documentation

## 2007-04-27 DIAGNOSIS — R7309 Other abnormal glucose: Secondary | ICD-10-CM | POA: Insufficient documentation

## 2007-06-02 ENCOUNTER — Ambulatory Visit: Payer: Self-pay | Admitting: Family Medicine

## 2007-06-02 LAB — CONVERTED CEMR LAB
ALT: 17 units/L (ref 0–35)
AST: 22 units/L (ref 0–37)
BUN: 13 mg/dL (ref 6–23)
Basophils Absolute: 0 10*3/uL (ref 0.0–0.1)
Calcium: 8.7 mg/dL (ref 8.4–10.5)
Chloride: 103 meq/L (ref 96–112)
Cholesterol: 182 mg/dL (ref 0–200)
Creatinine, Ser: 0.8 mg/dL (ref 0.4–1.2)
Eosinophils Absolute: 0.1 10*3/uL (ref 0.0–0.6)
GFR calc non Af Amer: 77 mL/min
HCT: 42 % (ref 36.0–46.0)
LDL Cholesterol: 101 mg/dL — ABNORMAL HIGH (ref 0–99)
MCHC: 35.3 g/dL (ref 30.0–36.0)
MCV: 87.5 fL (ref 78.0–100.0)
Monocytes Relative: 9 % (ref 3.0–11.0)
Neutrophils Relative %: 55.9 % (ref 43.0–77.0)
Platelets: 216 10*3/uL (ref 150–400)
RBC: 4.8 M/uL (ref 3.87–5.11)
RDW: 11.8 % (ref 11.5–14.6)
Sodium: 139 meq/L (ref 135–145)
TSH: 1.52 microintl units/mL (ref 0.35–5.50)
Total CHOL/HDL Ratio: 3.5
Triglycerides: 147 mg/dL (ref 0–149)

## 2007-06-06 ENCOUNTER — Ambulatory Visit: Payer: Self-pay | Admitting: Family Medicine

## 2007-06-06 ENCOUNTER — Other Ambulatory Visit: Admission: RE | Admit: 2007-06-06 | Discharge: 2007-06-06 | Payer: Self-pay | Admitting: Family Medicine

## 2007-06-06 ENCOUNTER — Encounter: Payer: Self-pay | Admitting: Family Medicine

## 2007-06-10 ENCOUNTER — Encounter (INDEPENDENT_AMBULATORY_CARE_PROVIDER_SITE_OTHER): Payer: Self-pay | Admitting: *Deleted

## 2007-06-20 ENCOUNTER — Encounter (INDEPENDENT_AMBULATORY_CARE_PROVIDER_SITE_OTHER): Payer: Self-pay | Admitting: *Deleted

## 2007-06-20 ENCOUNTER — Ambulatory Visit: Payer: Self-pay | Admitting: Family Medicine

## 2007-07-12 ENCOUNTER — Ambulatory Visit: Payer: Self-pay | Admitting: Family Medicine

## 2007-07-12 ENCOUNTER — Encounter: Payer: Self-pay | Admitting: Family Medicine

## 2007-07-12 IMAGING — MG MAM DGTL SCREENING MAMMO W/CAD
1 series · 4 of 4 positions shown · non-contrast
Comparison: none

REASON FOR EXAM: Screening mammogram
COMMENTS:

[Series 5887: R CC · right · 4 of 4 slices shown]
[im 1/4]
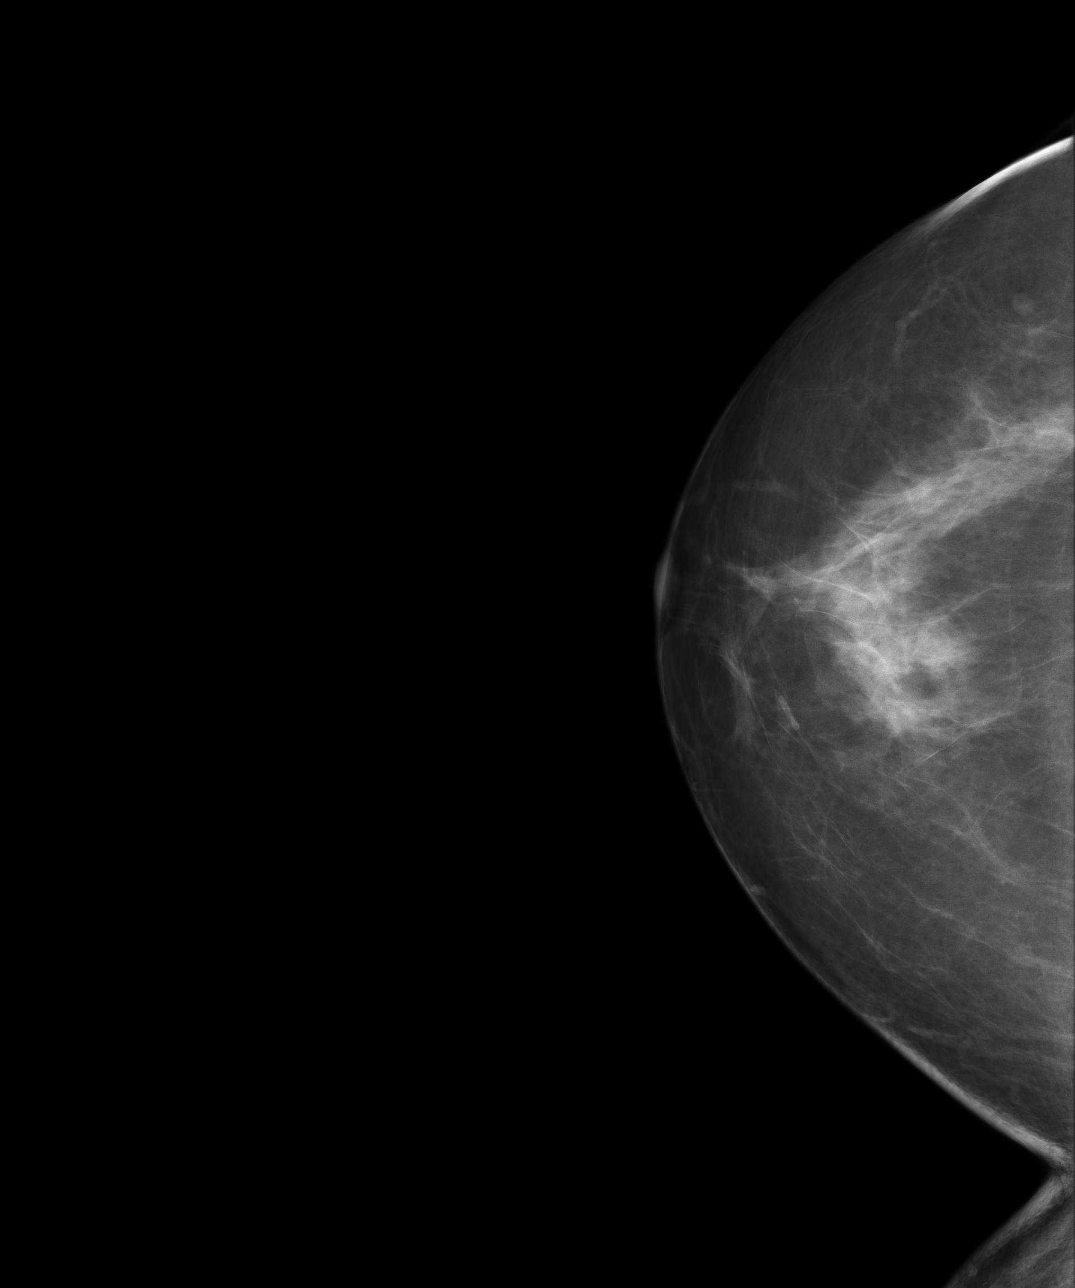
[im 2/4]
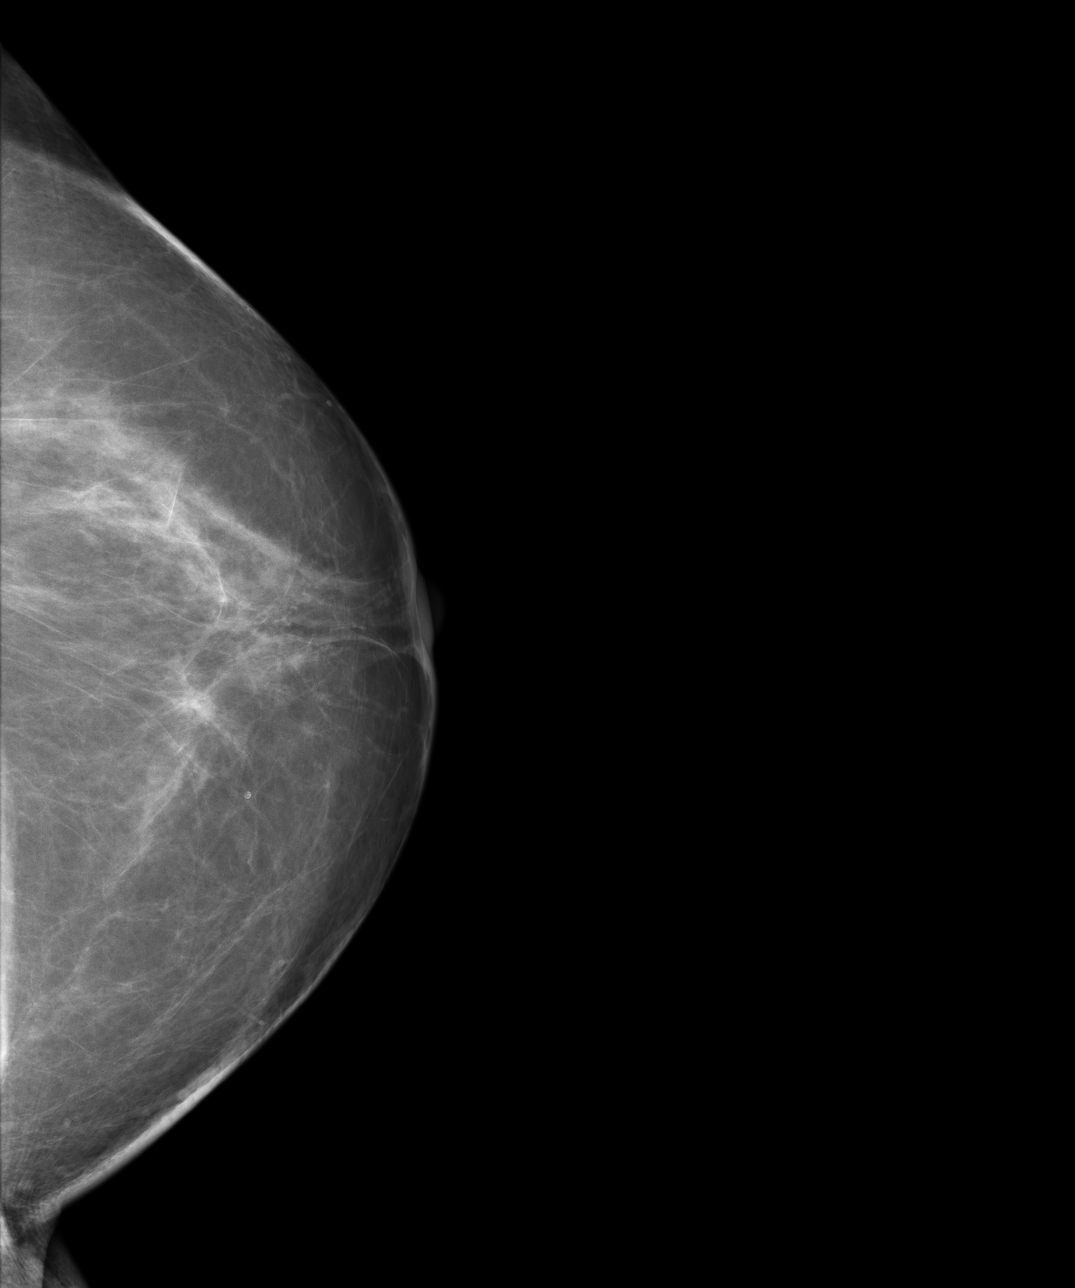
[im 3/4]
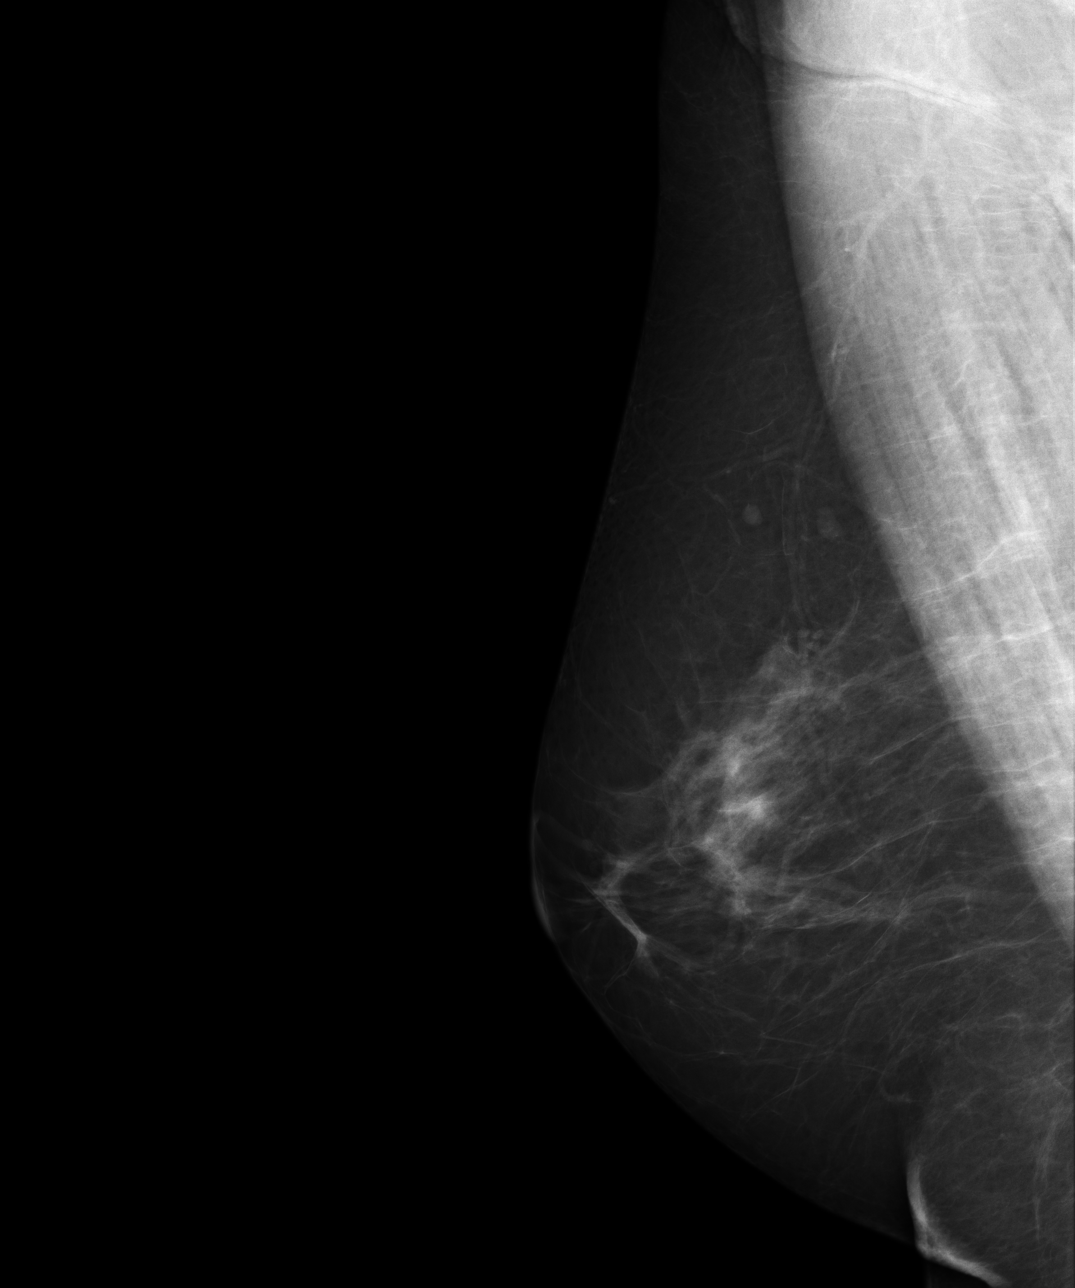
[im 4/4]
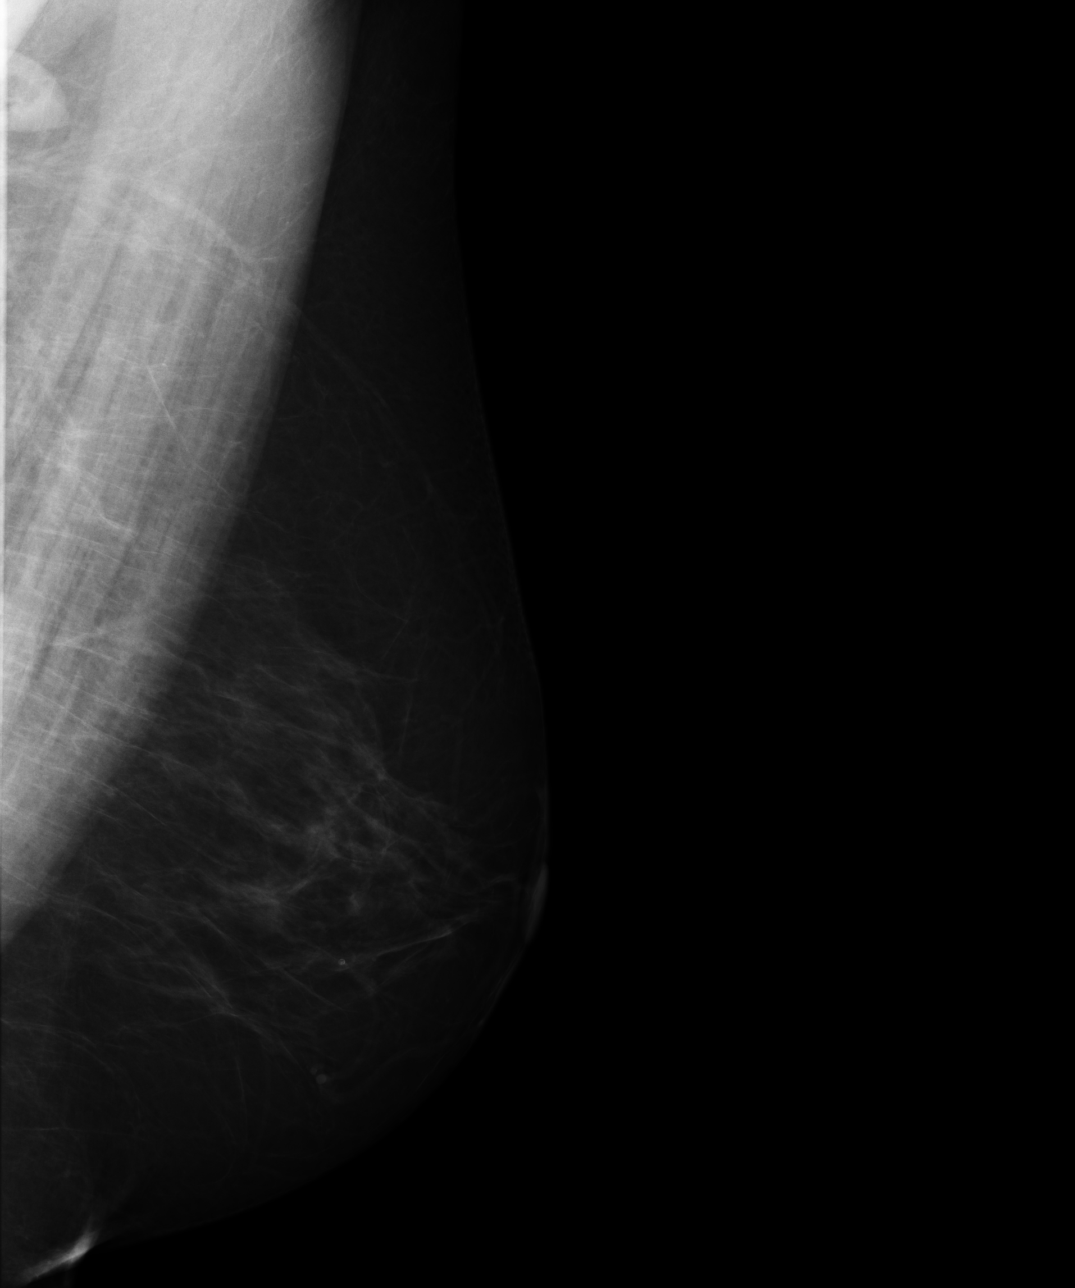

[4 of 4 positions shown; findings below may reference images not displayed]

PROCEDURE:     MAM - MAM DGTL SCREENING MAMMO W/CAD  - [DATE]  [DATE]

RESULT:     Comparison is made to prior studies dated [DATE], [DATE]
and [DATE].

The breasts demonstrate a heterogeneous parenchymal pattern. An area of
asymmetric, spiculated density projects within the medial paracentral
portion of the LEFT breast approximately 5.0 to 6.0 cm deep to the nipple.
Further evaluation with magnification compression imaging is recommended. No
further new radiographic evidence to suggest malignancy is appreciated.
IMPRESSION: BI-RADS: Category 0 - Needs Additional Imaging Evaluation.

Thank you for this opportunity to contribute to the care of your patient.

A NEGATIVE MAMMOGRAM REPORT DOES NOT PRECLUDE BIOPSY OR OTHER EVALUATION OF
A CLINICALLY PALPABLE OR OTHERWISE SUSPICIOUS MASS OR LESION. BREAST CANCER
MAY NOT BE DETECTED BY MAMMOGRAPHY IN UP TO 10% OF CASES.

## 2007-07-15 ENCOUNTER — Ambulatory Visit: Payer: Self-pay | Admitting: Family Medicine

## 2007-07-15 ENCOUNTER — Encounter: Payer: Self-pay | Admitting: Family Medicine

## 2007-07-15 IMAGING — MG MAM DGTL ADD VW LT  SCR
1 series · 3 of 3 positions shown · non-contrast
Comparison: none

REASON FOR EXAM: Left breast 5.0 to 6.0 cm density
U/S, if needed
COMMENTS:

PROCEDURE:     MAM - MAM DGTL ADD VW LT  SCR  - [DATE] [DATE]
RESULT:     The previously described, asymmetric density within the LEFT
breast is further evaluated with magnification compression imaging. This
area spreads and blends with the normal breast parenchyma indicative of a
benign finding.

[Series 6116: L ML · left · 3 of 3 slices shown]
[im 1/3]
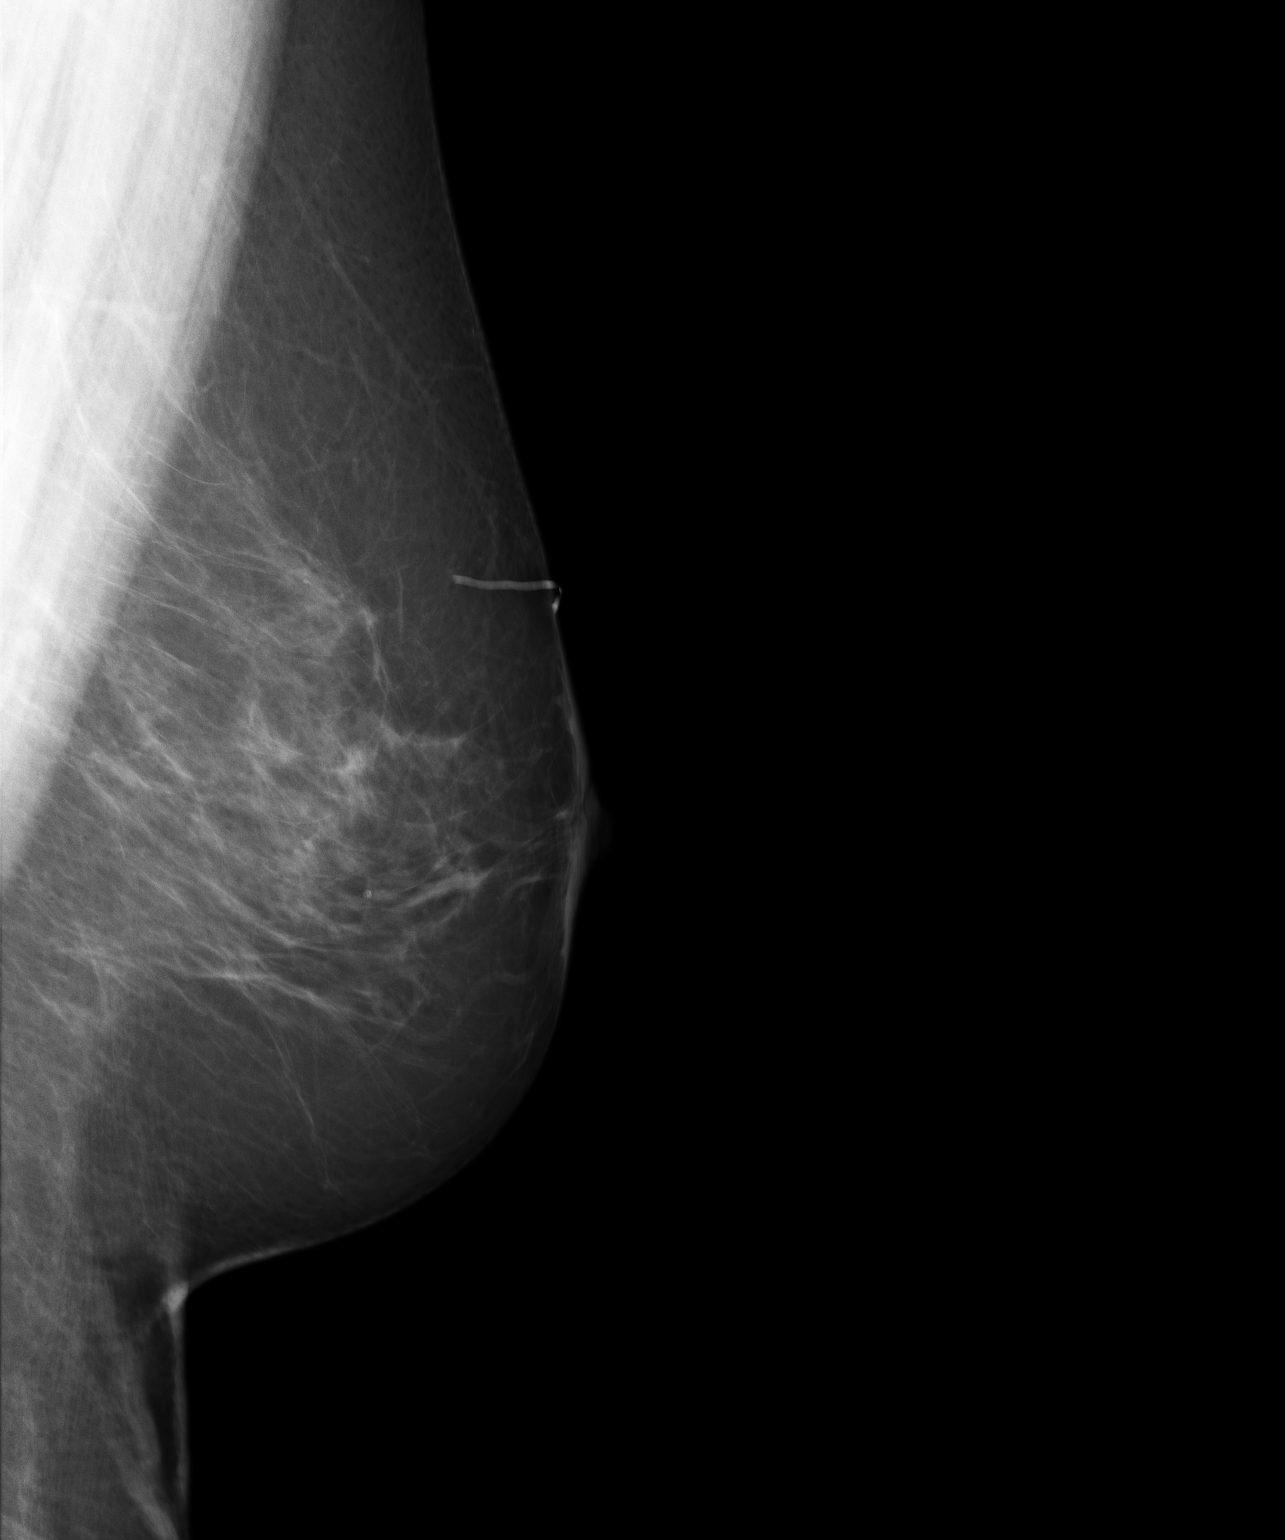
[im 2/3]
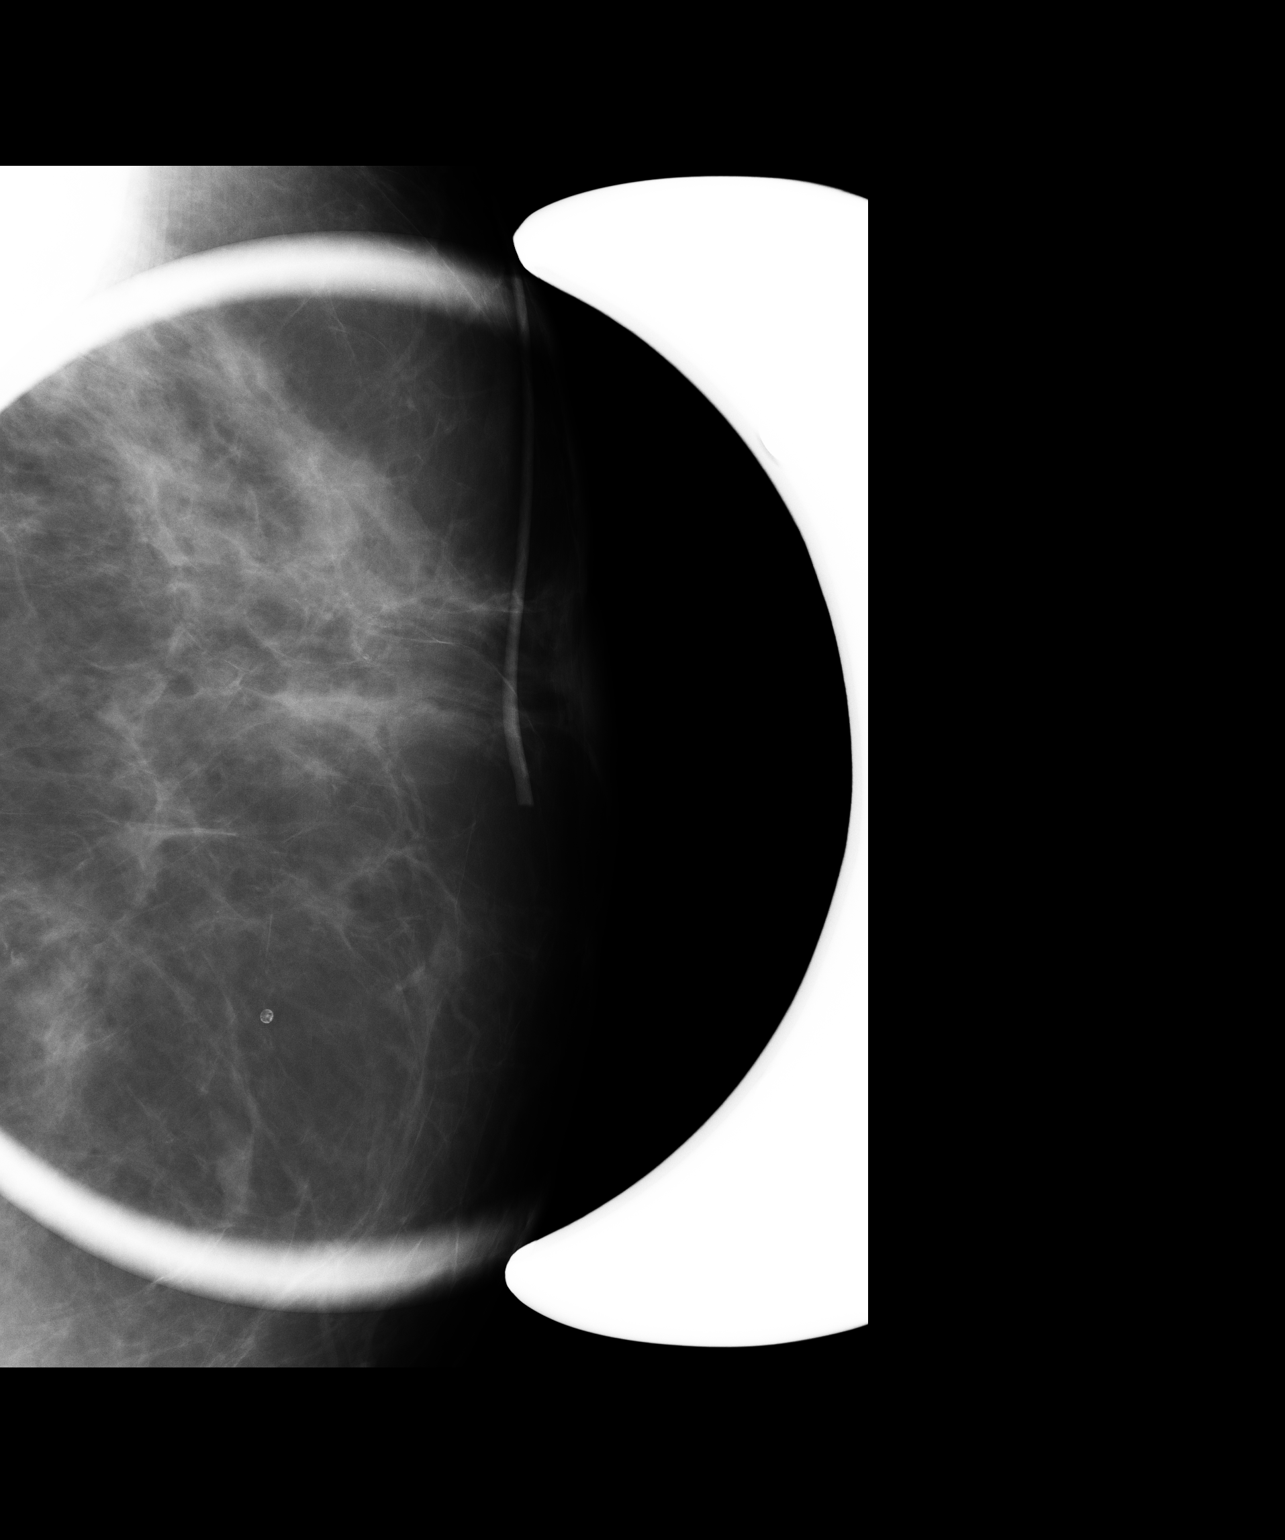
[im 3/3]
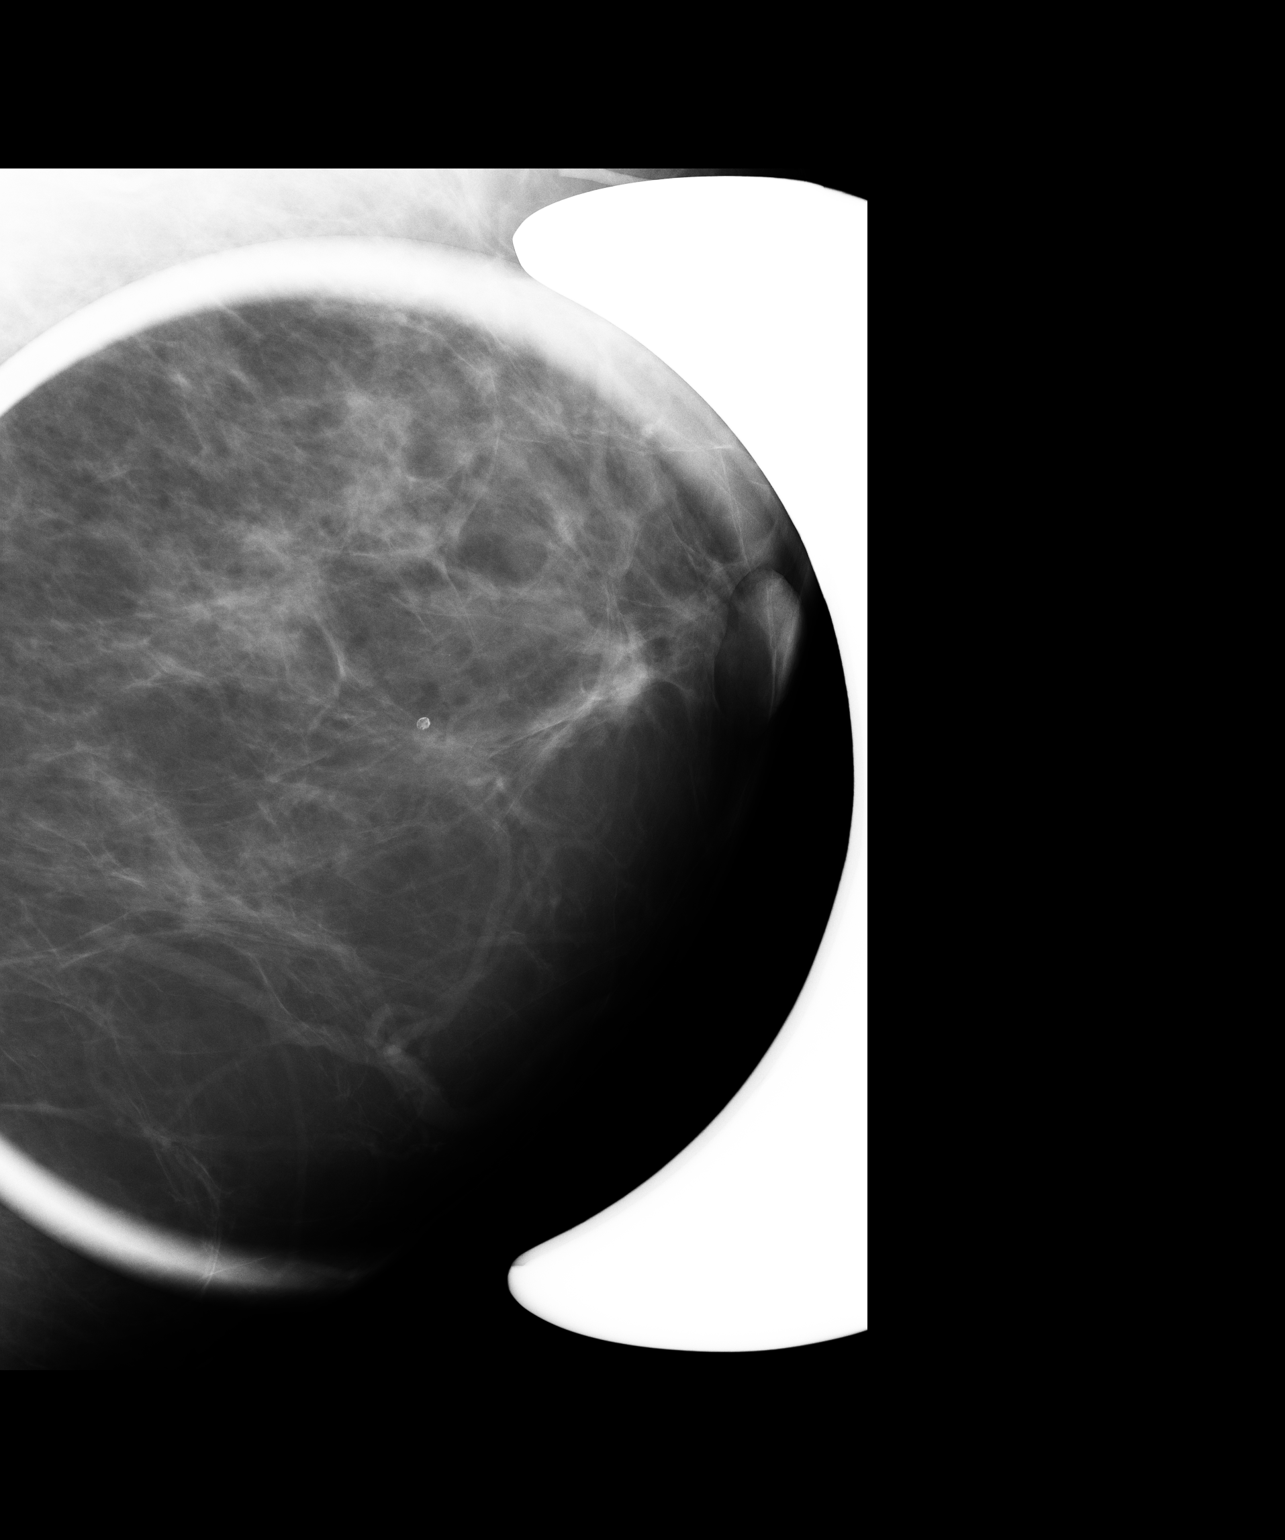

[3 of 3 positions shown; findings below may reference images not displayed]

IMPRESSION: BI-RADS: Category 2 - Benign Finding

Thank you for this opportunity to contribute to the care of your patient.

A NEGATIVE MAMMOGRAM REPORT DOES NOT PRECLUDE BIOPSY OR OTHER EVALUATION OF
A CLINICALLY PALPABLE OR OTHERWISE SUSPICIOUS MASS OR LESION. BREAST CANCER
MAY NOT BE DETECTED BY MAMMOGRAPHY IN UP TO 10% OF CASES.

## 2007-07-19 ENCOUNTER — Encounter (INDEPENDENT_AMBULATORY_CARE_PROVIDER_SITE_OTHER): Payer: Self-pay | Admitting: *Deleted

## 2007-07-27 ENCOUNTER — Ambulatory Visit: Payer: Self-pay | Admitting: Family Medicine

## 2007-10-17 ENCOUNTER — Ambulatory Visit: Payer: Self-pay | Admitting: Family Medicine

## 2008-03-27 ENCOUNTER — Ambulatory Visit: Payer: Self-pay | Admitting: Family Medicine

## 2008-06-08 ENCOUNTER — Ambulatory Visit: Payer: Self-pay | Admitting: Family Medicine

## 2008-06-08 LAB — CONVERTED CEMR LAB
ALT: 15 units/L (ref 0–35)
Basophils Absolute: 0.1 10*3/uL (ref 0.0–0.1)
Basophils Relative: 0.8 % (ref 0.0–3.0)
CO2: 30 meq/L (ref 19–32)
Calcium: 8.9 mg/dL (ref 8.4–10.5)
Cholesterol: 179 mg/dL (ref 0–200)
Creatinine, Ser: 0.7 mg/dL (ref 0.4–1.2)
Creatinine,U: 143.5 mg/dL
GFR calc Af Amer: 108 mL/min
Glucose, Bld: 91 mg/dL (ref 70–99)
HCT: 42.2 % (ref 36.0–46.0)
Hemoglobin: 14.5 g/dL (ref 12.0–15.0)
LDL Cholesterol: 88 mg/dL (ref 0–99)
Lymphocytes Relative: 38.7 % (ref 12.0–46.0)
MCHC: 34.5 g/dL (ref 30.0–36.0)
Microalb Creat Ratio: 12.5 mg/g (ref 0.0–30.0)
Microalb, Ur: 1.8 mg/dL (ref 0.0–1.9)
Monocytes Absolute: 0.6 10*3/uL (ref 0.1–1.0)
Neutro Abs: 3.5 10*3/uL (ref 1.4–7.7)
RBC: 4.72 M/uL (ref 3.87–5.11)
RDW: 11.9 % (ref 11.5–14.6)
Total Bilirubin: 0.6 mg/dL (ref 0.3–1.2)
Total Protein: 5.9 g/dL — ABNORMAL LOW (ref 6.0–8.3)
Triglycerides: 200 mg/dL — ABNORMAL HIGH (ref 0–149)

## 2008-06-11 ENCOUNTER — Ambulatory Visit: Payer: Self-pay | Admitting: Family Medicine

## 2008-06-11 ENCOUNTER — Other Ambulatory Visit: Admission: RE | Admit: 2008-06-11 | Discharge: 2008-06-11 | Payer: Self-pay | Admitting: Family Medicine

## 2008-06-13 ENCOUNTER — Encounter (INDEPENDENT_AMBULATORY_CARE_PROVIDER_SITE_OTHER): Payer: Self-pay | Admitting: *Deleted

## 2008-07-03 ENCOUNTER — Telehealth: Payer: Self-pay | Admitting: Family Medicine

## 2008-07-13 ENCOUNTER — Encounter (INDEPENDENT_AMBULATORY_CARE_PROVIDER_SITE_OTHER): Payer: Self-pay | Admitting: *Deleted

## 2008-07-13 ENCOUNTER — Ambulatory Visit: Payer: Self-pay | Admitting: Family Medicine

## 2008-07-13 LAB — CONVERTED CEMR LAB
OCCULT 1: NEGATIVE
OCCULT 2: NEGATIVE
OCCULT 3: NEGATIVE

## 2008-07-18 ENCOUNTER — Encounter: Payer: Self-pay | Admitting: Family Medicine

## 2008-07-25 ENCOUNTER — Encounter (INDEPENDENT_AMBULATORY_CARE_PROVIDER_SITE_OTHER): Payer: Self-pay | Admitting: *Deleted

## 2009-06-12 ENCOUNTER — Ambulatory Visit: Payer: Self-pay | Admitting: Family Medicine

## 2009-06-12 LAB — CONVERTED CEMR LAB
ALT: 16 units/L (ref 0–35)
AST: 22 units/L (ref 0–37)
Albumin: 3.6 g/dL (ref 3.5–5.2)
BUN: 13 mg/dL (ref 6–23)
Basophils Absolute: 0 10*3/uL (ref 0.0–0.1)
CO2: 30 meq/L (ref 19–32)
Chloride: 104 meq/L (ref 96–112)
Cholesterol: 194 mg/dL (ref 0–200)
Glucose, Bld: 87 mg/dL (ref 70–99)
HCT: 43.3 % (ref 36.0–46.0)
Hemoglobin: 14.6 g/dL (ref 12.0–15.0)
Lymphs Abs: 2.8 10*3/uL (ref 0.7–4.0)
MCHC: 33.6 g/dL (ref 30.0–36.0)
MCV: 92.7 fL (ref 78.0–100.0)
Microalb Creat Ratio: 5.5 mg/g (ref 0.0–30.0)
Monocytes Absolute: 0.6 10*3/uL (ref 0.1–1.0)
Monocytes Relative: 8.5 % (ref 3.0–12.0)
Neutro Abs: 3.8 10*3/uL (ref 1.4–7.7)
Potassium: 4.3 meq/L (ref 3.5–5.1)
RDW: 11.8 % (ref 11.5–14.6)
Total CHOL/HDL Ratio: 4
Total Protein: 6.1 g/dL (ref 6.0–8.3)

## 2009-06-17 ENCOUNTER — Ambulatory Visit: Payer: Self-pay | Admitting: Family Medicine

## 2009-06-17 ENCOUNTER — Other Ambulatory Visit: Admission: RE | Admit: 2009-06-17 | Discharge: 2009-06-17 | Payer: Self-pay | Admitting: Family Medicine

## 2009-06-17 DIAGNOSIS — I10 Essential (primary) hypertension: Secondary | ICD-10-CM | POA: Insufficient documentation

## 2009-06-17 LAB — CONVERTED CEMR LAB: Pap Smear: NORMAL

## 2009-06-19 ENCOUNTER — Encounter: Payer: Self-pay | Admitting: Family Medicine

## 2009-07-01 ENCOUNTER — Telehealth: Payer: Self-pay | Admitting: Family Medicine

## 2009-07-12 ENCOUNTER — Ambulatory Visit: Payer: Self-pay | Admitting: Family Medicine

## 2009-07-12 LAB — CONVERTED CEMR LAB
OCCULT 2: NEGATIVE
OCCULT 3: NEGATIVE

## 2009-07-15 ENCOUNTER — Encounter (INDEPENDENT_AMBULATORY_CARE_PROVIDER_SITE_OTHER): Payer: Self-pay | Admitting: *Deleted

## 2009-07-19 ENCOUNTER — Ambulatory Visit: Payer: Self-pay | Admitting: Family Medicine

## 2009-07-19 LAB — CONVERTED CEMR LAB: Rapid Strep: NEGATIVE

## 2009-08-20 ENCOUNTER — Ambulatory Visit: Payer: Self-pay | Admitting: Family Medicine

## 2009-09-17 ENCOUNTER — Ambulatory Visit: Payer: Self-pay | Admitting: Family Medicine

## 2009-09-17 LAB — CONVERTED CEMR LAB
CO2: 31 meq/L (ref 19–32)
Chloride: 106 meq/L (ref 96–112)
Sodium: 144 meq/L (ref 135–145)

## 2009-11-21 ENCOUNTER — Ambulatory Visit: Payer: Self-pay | Admitting: Family Medicine

## 2009-11-21 LAB — CONVERTED CEMR LAB
BUN: 14 mg/dL (ref 6–23)
Chloride: 104 meq/L (ref 96–112)
Glucose, Bld: 102 mg/dL — ABNORMAL HIGH (ref 70–99)
Potassium: 5.1 meq/L (ref 3.5–5.1)

## 2009-11-25 ENCOUNTER — Ambulatory Visit: Payer: Self-pay | Admitting: Family Medicine

## 2009-12-16 ENCOUNTER — Telehealth: Payer: Self-pay | Admitting: Family Medicine

## 2009-12-17 ENCOUNTER — Ambulatory Visit: Payer: Self-pay | Admitting: Family Medicine

## 2009-12-17 LAB — CONVERTED CEMR LAB
CO2: 32 meq/L (ref 19–32)
Calcium: 9 mg/dL (ref 8.4–10.5)
Creatinine, Ser: 0.7 mg/dL (ref 0.4–1.2)
Glucose, Bld: 91 mg/dL (ref 70–99)
Sodium: 142 meq/L (ref 135–145)

## 2009-12-19 ENCOUNTER — Ambulatory Visit: Payer: Self-pay | Admitting: Family Medicine

## 2010-01-27 ENCOUNTER — Encounter (INDEPENDENT_AMBULATORY_CARE_PROVIDER_SITE_OTHER): Payer: Self-pay | Admitting: *Deleted

## 2010-06-17 ENCOUNTER — Ambulatory Visit: Payer: Self-pay | Admitting: Family Medicine

## 2010-06-17 LAB — CONVERTED CEMR LAB
ALT: 15 units/L (ref 0–35)
AST: 19 units/L (ref 0–37)
Albumin: 3.5 g/dL (ref 3.5–5.2)
Alkaline Phosphatase: 59 units/L (ref 39–117)
BUN: 16 mg/dL (ref 6–23)
CO2: 29 meq/L (ref 19–32)
Chloride: 105 meq/L (ref 96–112)
Cholesterol: 191 mg/dL (ref 0–200)
Creatinine,U: 153.1 mg/dL
GFR calc non Af Amer: 106.13 mL/min (ref >60.00)
Glucose, Bld: 88 mg/dL (ref 70–99)
Microalb Creat Ratio: 0.5 mg/g (ref 0.0–30.0)
Microalb, Ur: 0.8 mg/dL (ref 0.0–1.9)
Potassium: 4.4 meq/L (ref 3.5–5.1)
Sodium: 140 meq/L (ref 135–145)
Total Protein: 5.9 g/dL — ABNORMAL LOW (ref 6.0–8.3)
VLDL: 24.8 mg/dL (ref 0.0–40.0)

## 2010-06-19 ENCOUNTER — Ambulatory Visit: Payer: Self-pay | Admitting: Family Medicine

## 2010-06-19 ENCOUNTER — Other Ambulatory Visit
Admission: RE | Admit: 2010-06-19 | Discharge: 2010-06-19 | Payer: Self-pay | Source: Home / Self Care | Admitting: Family Medicine

## 2010-06-19 LAB — HM PAP SMEAR

## 2010-07-02 ENCOUNTER — Encounter (INDEPENDENT_AMBULATORY_CARE_PROVIDER_SITE_OTHER): Payer: Self-pay | Admitting: *Deleted

## 2010-07-03 ENCOUNTER — Encounter: Payer: Self-pay | Admitting: Family Medicine

## 2010-07-03 ENCOUNTER — Ambulatory Visit: Payer: Self-pay | Admitting: Family Medicine

## 2010-07-03 LAB — HM MAMMOGRAPHY: HM Mammogram: NORMAL

## 2010-07-03 IMAGING — MG MAM DGTL SCREENING MAMMO W/CAD
1 series · 4 of 4 positions shown · non-contrast
Comparison: [DATE] and [DATE].

REASON FOR EXAM: scr
COMMENTS:

PROCEDURE:     MAM - MAM DGTL SCREENING MAMMO W/CAD  - [DATE]  [DATE]
RESULT:

[R CC · right · 4 of 4 slices shown]
[im 1/4]
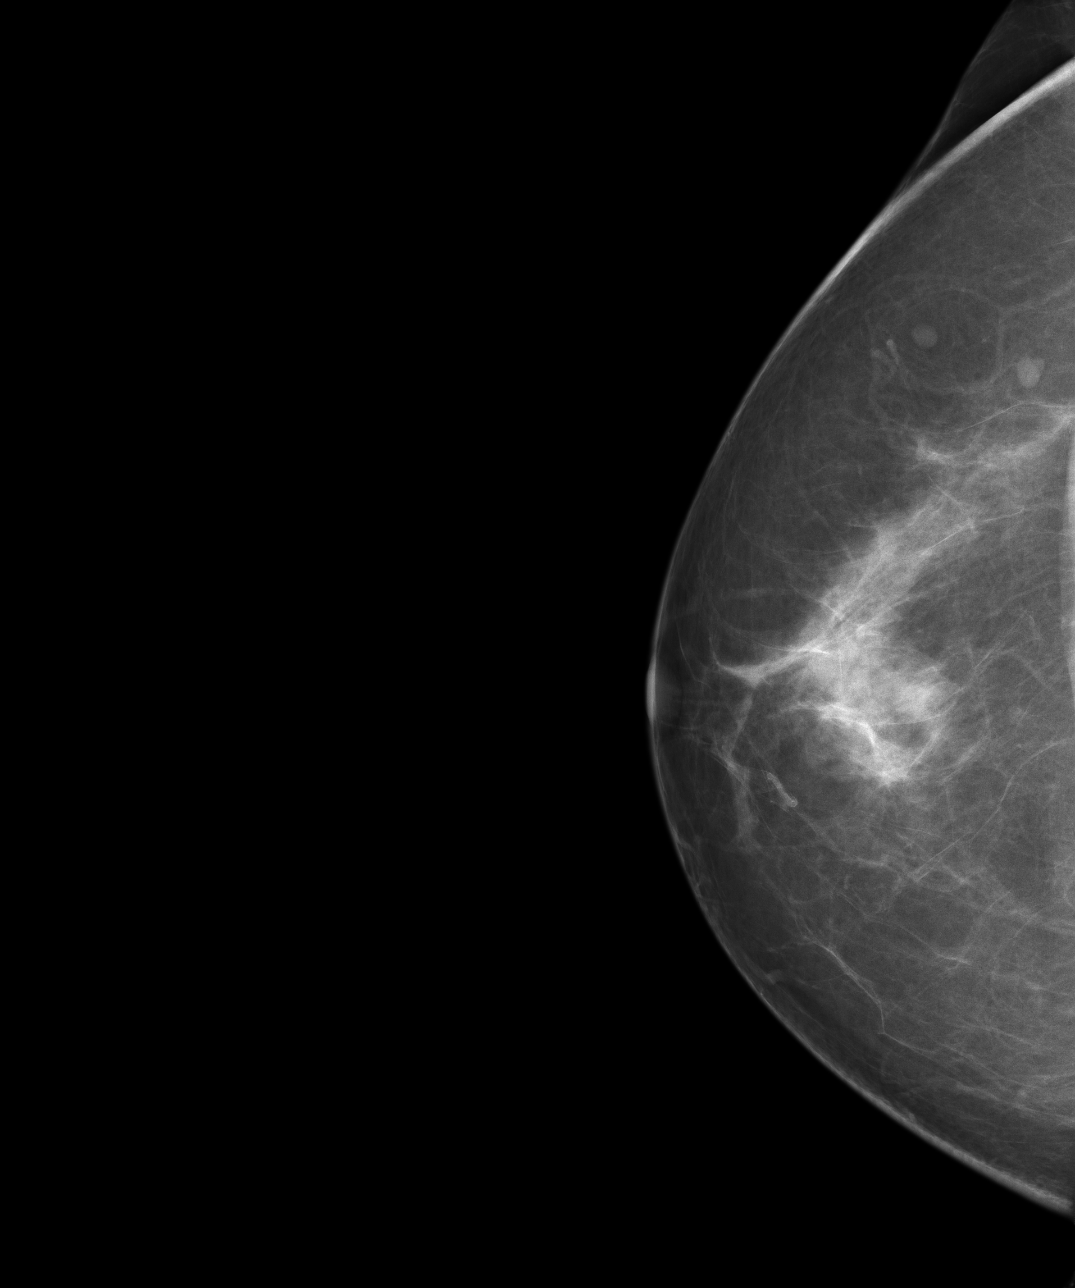
[im 2/4]
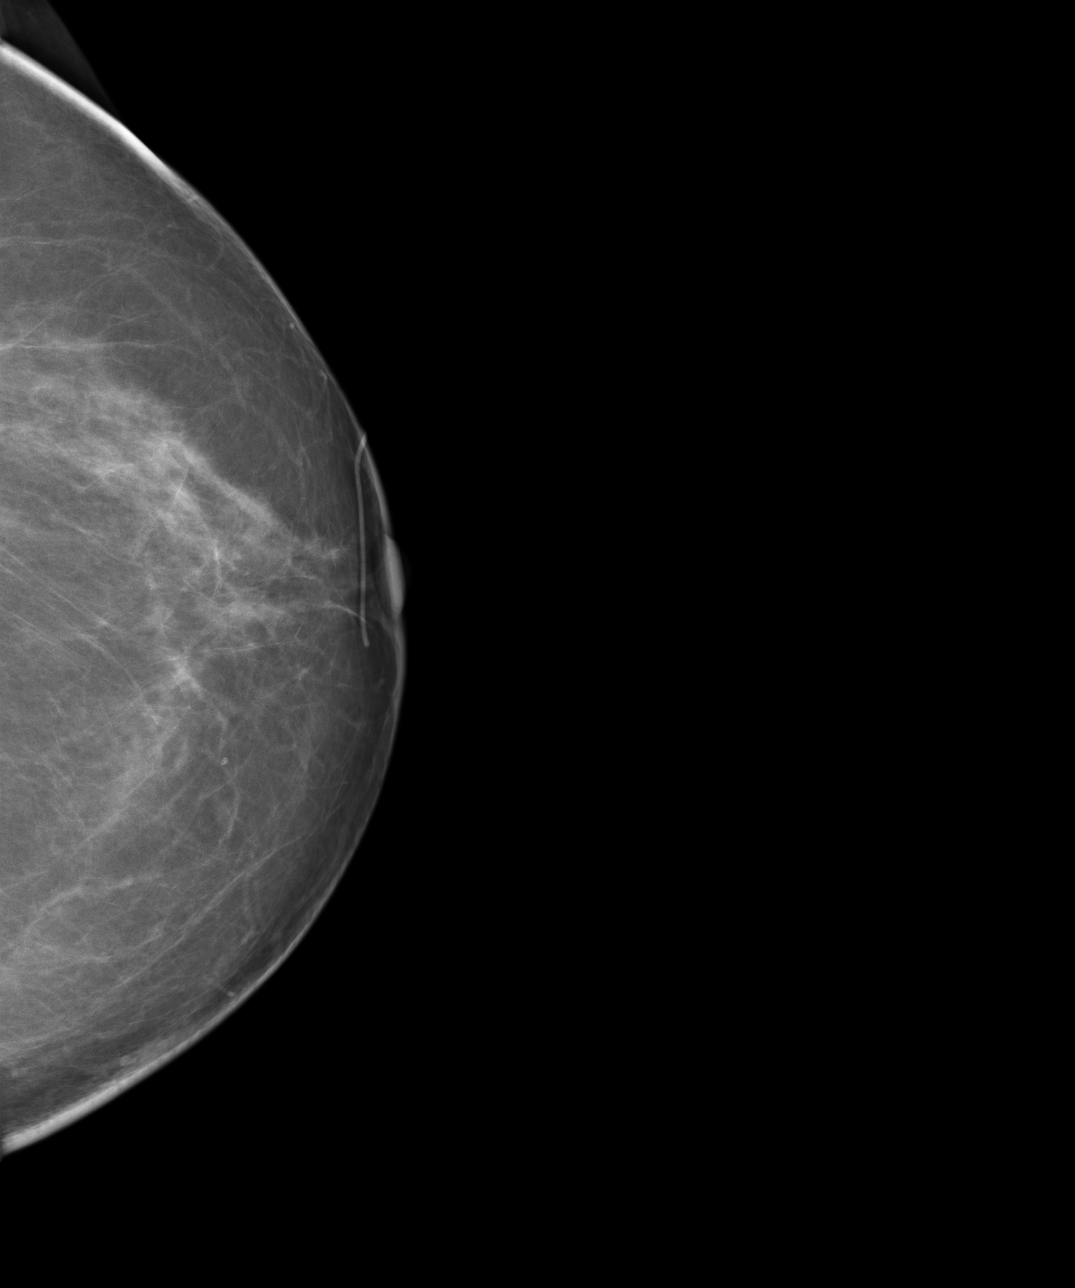
[im 3/4]
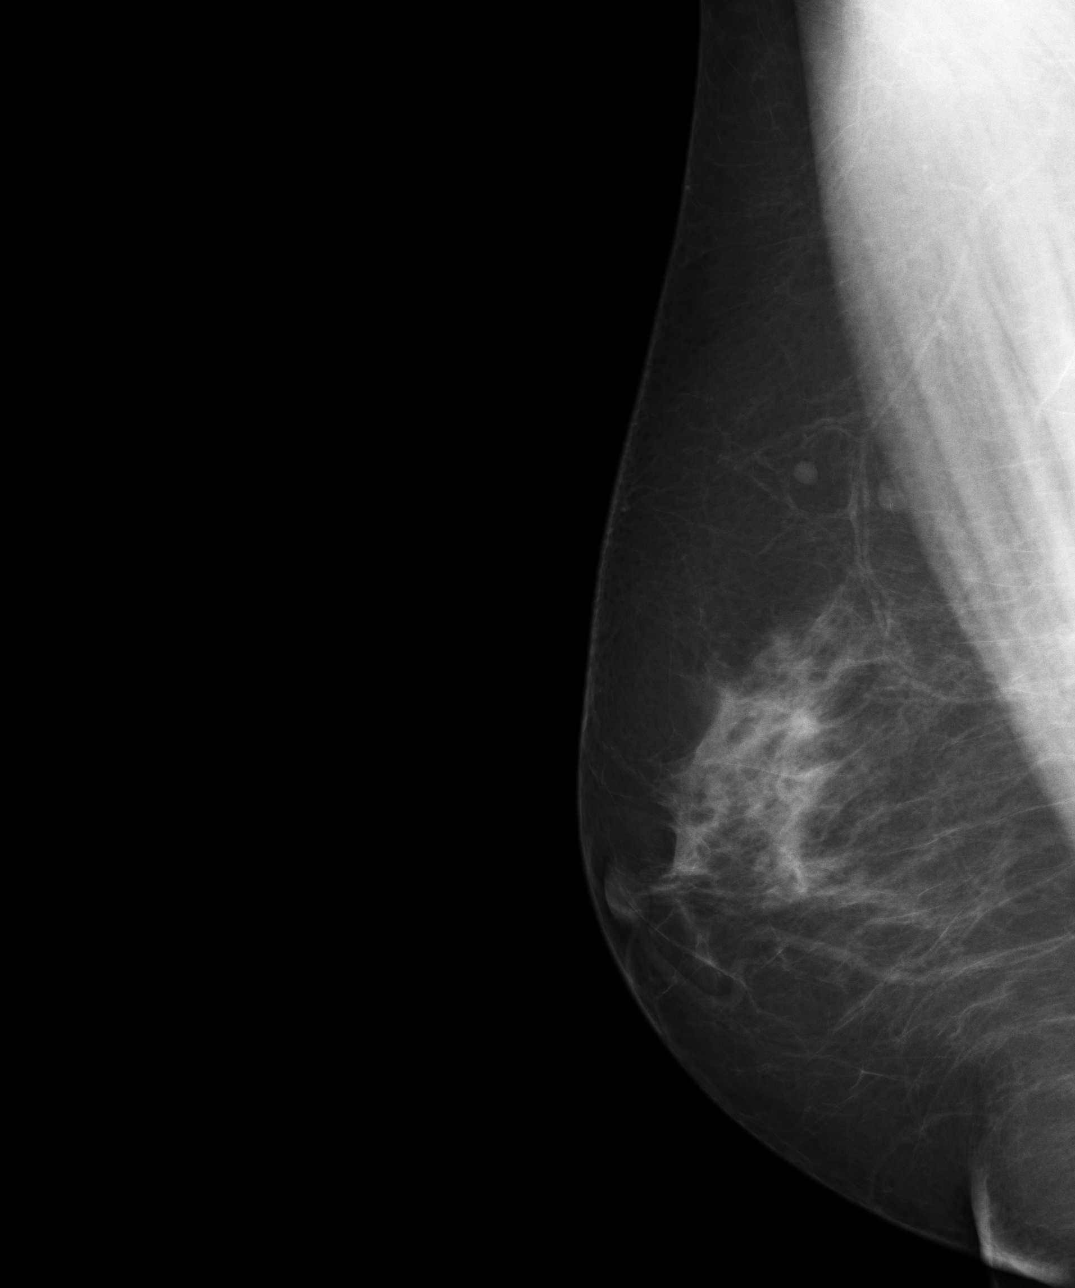
[im 4/4]
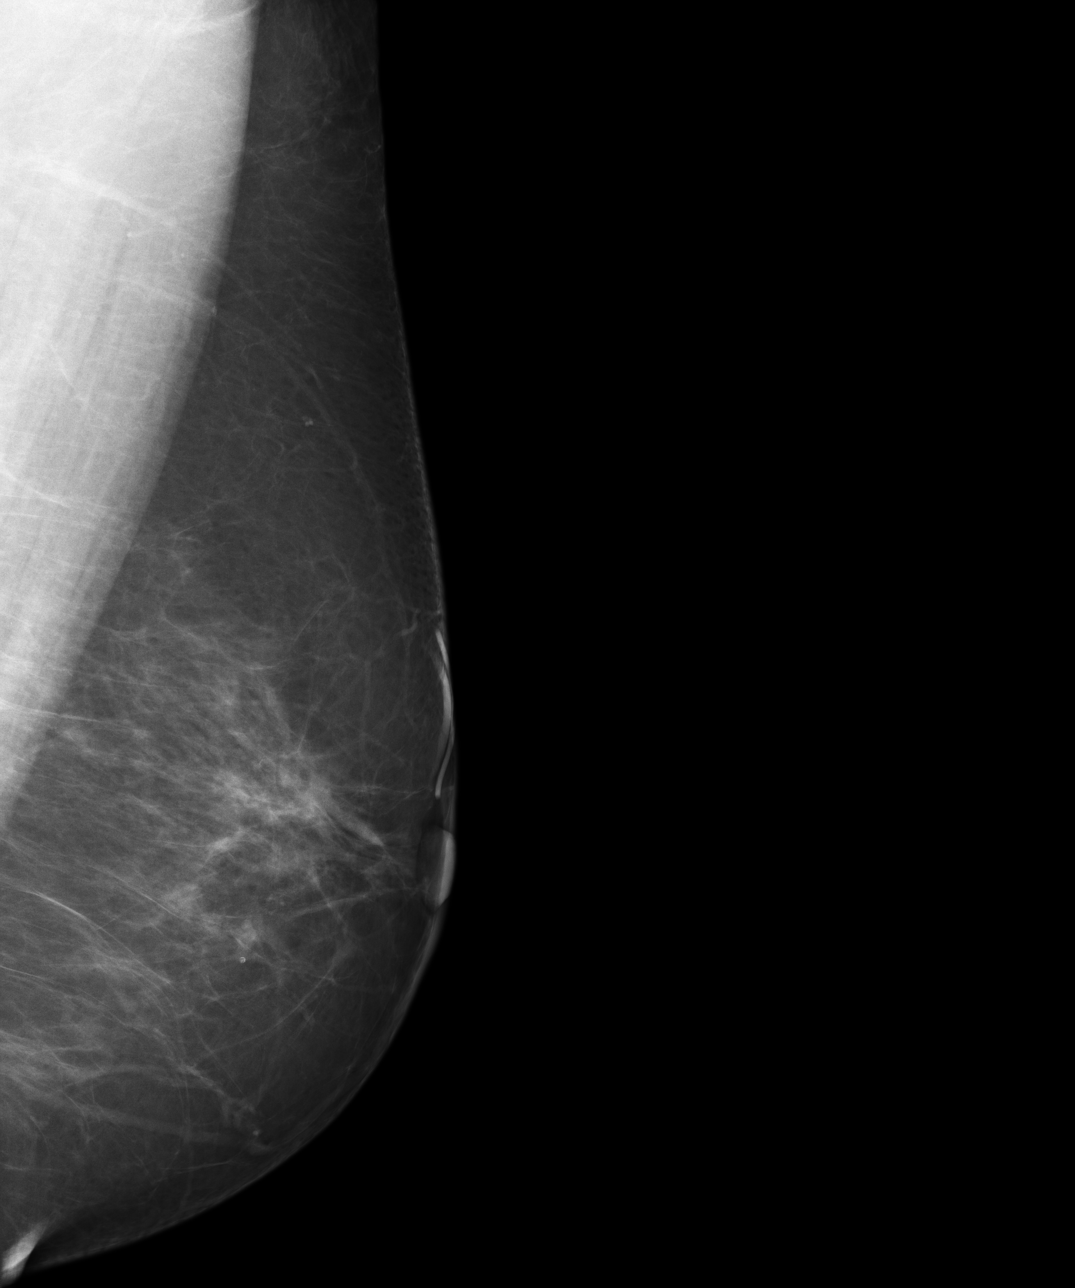

[4 of 4 positions shown; findings below may reference images not displayed]

FINDINGS: There is scattered fibroglandular tissue.  Small round masses in
the superolateral right breast are unchanged from prior studies and likely
represent small lymph nodes. No  new or suspicious masses or calcifications
are identified.  No areas of architectural distortion.
IMPRESSION: 1.      BI-RADS: Category 1-Negative.
2.     Continued annual screening mammography is recommended.

A negative mammogram report does not preclude biopsy or other evaluation of
a clinically palpable or otherwise suspicious mass or lesion. Breast cancer
may not be detected by mammography in up to 10% of cases.

## 2010-07-08 ENCOUNTER — Encounter (INDEPENDENT_AMBULATORY_CARE_PROVIDER_SITE_OTHER): Payer: Self-pay | Admitting: *Deleted

## 2010-07-22 NOTE — Letter (Signed)
Summary: Nadara Eaton letter  [place] [facility]  [address] [place], [place] [address]   Phone: [phone]  Fax: [phone]       01/27/2010 MRN: [id number]  KAILEENA OBI [address] [place], [place]  [address]  Dear Ms. [facility] [place], and [place] announce the retirement of Arta Silence, M.D., from full-time practice at the [facility] office effective December 19, 2009 and his plans of returning part-time.  It is important to Dr. Hetty Ely and to our practice that you understand that [facility] has seven physicians in our office for your health care needs.  We will continue to offer the same exceptional care that you have today.    Dr. Hetty Ely has spoken to many of you about his plans for retirement and returning part-time in the fall.   We will continue to work with you through the transition to schedule appointments for you in the office and meet the high standards that [facility] is committed to.   Again, it is with great pleasure that we share the news that Dr. Hetty Ely will return to [facility] at [facility] in October of 2011 with a reduced schedule.    If you have any questions, or would like to request an appointment with one of our physicians, please call us at [phone] and press the option for Scheduling an appointment.  We take pleasure in providing you with excellent patient care and look forward to seeing you at your next office visit.  Our [facility] Physicians are:  Tillman Abide, M.D. Laurita Quint, M.D. Roxy Manns, M.D. Kerby Nora, M.D. Hannah Beat, M.D. Ruthe Mannan, M.D. We proudly welcomed Raechel Ache, M.D. and Eustaquio Boyden, M.D. to the practice in July/August 2011.  Sincerely,  [facility]

## 2010-07-22 NOTE — Assessment & Plan Note (Signed)
Summary: 3 MONTH FOLLOW UP/[facility]   Vital Signs:  Patient profile:   65 year old female Weight:      161.50 pounds Temp:     98.1 degrees F oral Pulse rate:   72 / minute Pulse rhythm:   regular BP sitting:   138 / 74  (left arm) Cuff size:   regular  Vitals Entered By: Sydell Axon LPN (November 25, 1608 8:08 AM) CC: 3 Month follow-up   History of Present Illness: Pt here for three month followup for BP check. She has been on Lisinopril 5mg  for three months. She has lost weight since last being seen.n She feels well with no complaints.   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 2 Tablets At Bedtime 2)  Evista 60 Mg  Tabs (Raloxifene Hcl) .Marland Kitchen.. 1 Daily By Mouth 3)  Flonase 50 Mcg/act  Susp (Fluticasone Propionate) .Marland Kitchen.. 1 Spray Each Nostril Daily As Needed 4)  Allegra 180 Mg  Tabs (Fexofenadine Hcl) .Marland Kitchen.. 1 Once Daily As Needed 5)  Vicodin 5-500 Mg Tabs (Hydrocodone-Acetaminophen) .Marland Kitchen.. 1 At Bedtime As Needed For Pain 6)  Multivitamins   Tabs (Multiple Vitamin) .... Take 1 Tablet By Mouth Once A Day 7)  Calcium Carbonate-Vitamin D 600-400 Mg-Unit  Tabs (Calcium Carbonate-Vitamin D) .... Take 1 Tablet By Mouth Once A Day 8)  Cyclobenzaprine Hcl 10 Mg Tabs (Cyclobenzaprine Hcl) .... One Tab By Mouth Three Times A Day As Needed Musc Spasm 9)  Meloxicam 15 Mg Tabs (Meloxicam) .... One By Mouth Daily As Needed 10)  Lisinopril 5 Mg Tabs (Lisinopril) .... One Tab By Mouth At Night  Allergies: 1)  ! Codeine  Family History: Father:DECEASED  61 YOA HTN, AFIB,STROKE ,PVD STENTS LE'S  Mother: ALIVE 37 YOA OSTEOPENIA BROTHER A 67  PROSTATE CANCER (PROSTATECTOMY) RADIATION 5 YRS LATER BROTHER A 55 Prostate Cancer (Prostatectomy) CV: + FATHER PACER (AFIB) HBP: + FATHER DM: + BROTHERS CHILD GOUT/ARTHRITIS  PROSTATE CANCER: + BROTHER BREAST/OVARIAN/UTERINE CANCER: NEG, +GM NON HODGKINS LYMPHOMA COLON CANCER: NEGATIVE DEPRESSION: + DAUGHTER ETOH/DRUG ABUSE: NEGATIVE OTHER :+STROKE FATHER (BLEED)   Physical Exam  General:  Well-developed,well-nourished,in no acute distress; alert,appropriate and cooperative throughout examination Head:  Normocephalic and atraumatic without obvious abnormalities. No apparent alopecia or balding. Eyes:  Conjunctiva clear bilaterally.  Ears:  External ear exam shows no significant lesions or deformities.  Otoscopic examination reveals clear canals, tympanic membranes are intact bilaterally without bulging, retraction, inflammation or discharge. Hearing is grossly normal bilaterally. Nose:  External nasal examination shows no deformity or inflammation. Nasal mucosa are pink and moist without lesions or exudates. Mouth:  mild erythema, otherwise normal. Neck:  No deformities, masses, or tenderness noted. Chest Wall:  No deformities, masses, or tenderness noted. Breasts:  No mass, nodules, thickening, tenderness, bulging, retraction, inflamation, nipple discharge or skin changes noted.   Lungs:  Normal respiratory effort, chest expands symmetrically. Lungs are clear to auscultation, no crackles or wheezes. Heart:  Normal rate and regular rhythm. S1 and S2 normal without gallop, murmur, click, rub or other extra  sounds.   Impression & Recommendations:  Problem # 1:  HYPERTENSION (ICD-401.9) Assessment Improved Better nos but Potassium high.  She eats a banana a day. Will have her stop that and recheck at the end of the month. Bmet prior. Her updated medication list for this problem includes:     Lisinopril 5 Mg Tabs (Lisinopril) ..... One tab by mouth at night  BP today: 138/74 Prior BP: 158/80 (08/20/2009)  Prior 10 Yr Risk Heart Disease: 9 % (08/20/2009)  Labs Reviewed: K+: 5.1 (11/21/2009) Creat: : 0.7 (11/21/2009)   Chol: 194 (06/12/2009)   HDL: 52.70 (06/12/2009)   LDL: 88 (06/08/2008)   TG: 280.0 (06/12/2009)  Problem # 2:  HYPERGLYCEMIA (ICD-790.29) Assessment: Unchanged  Glu again slightly elevated. Avoid candy from her husband.!! Will recheck prior to next appt. She has lost weight since last time, this has  helped. Cont.  Labs Reviewed: Creat: 0.7 Glu 102 (11/21/2009)     Complete Medication List: 1)  Pravachol 40 Mg Tabs (Pravastatin sodium) .... 2 tablets at bedtime 2)  Evista 60 Mg Tabs (Raloxifene hcl) .Marland Kitchen.. 1 daily by mouth 3)  Flonase 50 Mcg/act Susp (Fluticasone propionate) .Marland Kitchen.. 1 spray each nostril daily as needed 4)  Allegra 180 Mg Tabs (Fexofenadine hcl) .Marland Kitchen.. 1 once daily as needed 5)  Vicodin 5-500 Mg Tabs (Hydrocodone-acetaminophen) .Marland Kitchen.. 1 at bedtime as needed for pain 6)  Multivitamins Tabs (Multiple vitamin) .... Take 1 tablet by mouth once a day 7)  Calcium Carbonate-vitamin D 600-400 Mg-unit Tabs (Calcium carbonate-vitamin d) .... Take 1 tablet by mouth once a day 8)  Cyclobenzaprine Hcl 10 Mg Tabs (Cyclobenzaprine hcl) .... One tab by mouth three times a day as needed musc spasm 9)  Meloxicam 15 Mg Tabs (Meloxicam) .... One by mouth daily as needed 10)  Lisinopril 5 Mg Tabs (Lisinopril) .... One tab by mouth at night  Patient Instructions: 1)  RTC end of the month, BMET prior 401.9   Current Allergies (reviewed today): ! CODEINE

## 2010-07-22 NOTE — Letter (Signed)
Summary: Results Follow up Letter  [place] [facility]  [address] [place], [place] [address]   Phone: [phone]  Fax: [phone]    07/15/2009 MRN: [id number]     Desiree Ortega [address] [place], [place]  [address]    Dear Desiree Ortega,  The following are the results of your recent test(s):  Test         Result    Pap Smear:        Normal _____  Not Normal _____ Comments: ______________________________________________________ Cholesterol: LDL(Bad cholesterol):         Your goal is less than:         HDL (Good cholesterol):       Your goal is more than: Comments:  ______________________________________________________ Mammogram:        Normal _____  Not Normal _____ Comments:  ___________________________________________________________________ Hemoccult:        Normal _x____  Not normal _______ Comments: Repeat in 1 year   _____________________________________________________________________ Other Tests:    We routinely do not discuss normal results over the telephone.  If you desire a copy of the results, or you have any questions about this information we can discuss them at your next office visit.   Sincerely,  Laurita Quint MD

## 2010-07-22 NOTE — Assessment & Plan Note (Signed)
Summary: ST/CLE   Vital Signs:  Patient profile:   65 year old female Height:      66. inches Weight:      164.25 pounds BMI:     26.61 Temp:     98 degrees F oral Pulse rate:   84 / minute Pulse rhythm:   regular BP sitting:   142 / 76  (left arm) Cuff size:   regular  Vitals Entered By: Delilah Shan CMA Duncan Dull) (July 19, 2009 12:03 PM) CC: ST   History of Present Illness: 65 yo with three days of sore throat and headache. No nasal drainage, cough, fevers, chills, body aches, ear pain, n/v/d. No rashes. Taking Losenges with some relief of symptoms. Tylenol also helps a little with throat pain. 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  Current Medications (verified): 1)  Pravachol 40 Mg  Tabs (Pravastatin Sodium) .... 2 Tablets At Bedtime 2)  Evista 60 Mg  Tabs (Raloxifene Hcl) .Marland Kitchen.. 1 Daily By Mouth 3)  Flonase 50 Mcg/act  Susp (Fluticasone Propionate) .Marland Kitchen.. 1 Spray Each Nostril Daily As Needed 4)  Allegra 180 Mg  Tabs (Fexofenadine Hcl) .Marland Kitchen.. 1 Once Daily As Needed 5)  Vicodin 5-500 Mg Tabs (Hydrocodone-Acetaminophen) .Marland Kitchen.. 1 At Bedtime As Needed For Pain 6)  Multivitamins   Tabs (Multiple Vitamin) .... Take 1 Tablet By Mouth Once A Day 7)  Calcium Carbonate-Vitamin D 600-400 Mg-Unit  Tabs (Calcium Carbonate-Vitamin D) .... Take 1 Tablet By Mouth Once A Day 8)  Cyclobenzaprine Hcl 10 Mg Tabs (Cyclobenzaprine Hcl) .... One Tab By Mouth Three Times A Day As Needed Musc Spasm 9)  Meloxicam 15 Mg Tabs (Meloxicam) .... One By Mouth Daily As Needed  Allergies: 1)  ! Codeine  Review of Systems      See HPI General:  Denies chills and fever.  Physical Exam  General:  Well-developed,well-nourished,in no acute distress; alert,appropriate and cooperative throughout examination Ears:  External ear exam shows no significant lesions or deformities.  Otoscopic examination reveals clear canals, tympanic membranes are intact bilaterally without bulging,  retraction, inflammation or discharge. Hearing is grossly normal bilaterally. Nose:  External nasal examination shows no deformity or inflammation. Nasal mucosa are pink and moist without lesions or exudates. Mouth:  mild erythema, otherwise normal. Lungs:  Normal respiratory effort, chest expands symmetrically. Lungs are clear to auscultation, no crackles or wheezes. Heart:  Normal rate and regular rhythm. S1 and S2 normal without gallop, murmur, click, rub or other extra sounds. Skin:  Intact without suspicious lesions or rashes Cervical Nodes:  No lymphadenopathy noted Psych:  Cognition and judgment appear intact. Alert and cooperative with normal attention span and concentration. No apparent delusions, illusions, hallucinations   Impression & Recommendations:  Problem # 1:  SORE THROAT (ICD-462) Assessment New  Likely viral.  Rapid strep negative.  Continue supportive measures. Her updated medication list for this problem includes:    Meloxicam 15 Mg Tabs (Meloxicam) ..... One by mouth daily as needed  Orders: Rapid Strep ([address])  Complete Medication List: 1)  Pravachol 40 Mg Tabs (Pravastatin sodium) .... 2 tablets at bedtime 2)  Evista 60 Mg Tabs (Raloxifene hcl) .Marland Kitchen.. 1 daily by mouth 3)  Flonase 50 Mcg/act Susp (Fluticasone propionate) .Marland Kitchen.. 1 spray each nostril daily as needed 4)  Allegra 180 Mg Tabs (Fexofenadine hcl) .Marland Kitchen.. 1 once daily as needed 5)  Vicodin 5-500 Mg Tabs (Hydrocodone-acetaminophen) .Marland Kitchen.. 1 at bedtime as needed for pain 6)  Multivitamins Tabs (Multiple  vitamin) .... Take 1 tablet by mouth once a day 7)  Calcium Carbonate-vitamin D 600-400 Mg-unit Tabs (Calcium carbonate-vitamin d) .... Take 1 tablet by mouth once a day 8)  Cyclobenzaprine Hcl 10 Mg Tabs (Cyclobenzaprine hcl) .... One tab by mouth three times a day as needed musc spasm 9)  Meloxicam 15 Mg Tabs (Meloxicam) .... One by mouth daily as needed  Patient Instructions: 1)  Get plenty of rest, drink  lots of clear liquids, and use Tylenol or Ibuprofen for fever and comfort. Return in 7-10 days if you're not better: sooner if you'er feeling worse.   Current Allergies (reviewed today): ! CODEINE  Laboratory Results   Date/Time Reported: July 19, 2009 12:17 PM   Other Tests  Rapid Strep: negative

## 2010-07-22 NOTE — Assessment & Plan Note (Signed)
Summary: 2 M F/U BP CHECK/DLO   Vital Signs:  Patient profile:   65 year old female Weight:      164 pounds Temp:     98.4 degrees F oral Pulse rate:   84 / minute Pulse rhythm:   regular BP sitting:   158 / 80  (left arm) Cuff size:   regular  Vitals Entered By: Sydell Axon LPN (August 20, 1608 9:01 AM) CC: 2 Month follow-up on BP, Hypertension Management   History of Present Illness: Pt here for recheck of elevated BP last time. She has gained a small amt of weight but feels well. She has had elevated sugar in the past which she normalized last time. She feels well and has no complaints.  Her mother started living with her prior to her last visit and that is going well.  Hypertension History:      Positive major cardiovascular risk factors include female age 33 years old or older, hyperlipidemia, and hypertension.  Negative major cardiovascular risk factors include non-tobacco-user status.     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 2 Tablets At Bedtime 2)  Evista 60 Mg  Tabs (Raloxifene Hcl) .Marland Kitchen.. 1 Daily By Mouth 3)  Flonase 50 Mcg/act  Susp (Fluticasone Propionate) .Marland Kitchen.. 1 Spray Each Nostril Daily As Needed 4)  Allegra 180 Mg  Tabs (Fexofenadine Hcl) .Marland Kitchen.. 1 Once Daily As Needed 5)  Vicodin 5-500 Mg Tabs (Hydrocodone-Acetaminophen) .Marland Kitchen.. 1 At Bedtime As Needed For Pain 6)  Multivitamins   Tabs (Multiple Vitamin) .... Take 1 Tablet By Mouth Once A Day 7)  Calcium Carbonate-Vitamin D 600-400  Mg-Unit  Tabs (Calcium Carbonate-Vitamin D) .... Take 1 Tablet By Mouth Once A Day 8)  Cyclobenzaprine Hcl 10 Mg Tabs (Cyclobenzaprine Hcl) .... One Tab By Mouth Three Times A Day As Needed Musc Spasm 9)  Meloxicam 15 Mg Tabs (Meloxicam) .... One By Mouth Daily As Needed  Allergies: 1)  ! Codeine  Physical Exam  General:  Well-developed,well-nourished,in no acute distress; alert,appropriate and cooperative throughout examination Head:  Normocephalic and atraumatic without obvious abnormalities. No apparent alopecia or balding. Eyes:  Conjunctiva clear bilaterally.  Ears:  External ear exam shows no significant lesions or deformities.  Otoscopic examination reveals clear canals, tympanic membranes are intact bilaterally without bulging, retraction, inflammation or discharge. Hearing is grossly normal bilaterally. Nose:  External nasal examination shows no deformity or inflammation. Nasal mucosa are pink and moist without lesions or exudates. Mouth:  mild erythema, otherwise normal. Neck:  No deformities, masses, or tenderness noted. Lungs:  Normal respiratory effort, chest expands symmetrically. Lungs are clear to auscultation, no crackles or wheezes. Heart:  Normal rate and regular rhythm. S1 and S2 normal without gallop, murmur, click, rub or other extra sounds.   Impression & Recommendations:  Problem # 1:  HYPERTENSION (ICD-401.9) Assessment Unchanged  Still elevated since Dec. Will start Lisinopril at 5mg . Take at night.  Recheck in 3mos with labs to check kidney and K prior. Her updated medication list  for this problem includes:    Lisinopril 5 Mg Tabs (Lisinopril) ..... One tab by mouth at night  BP today: 158/80 Prior BP: 142/76 (07/19/2009)  10 Yr Risk Heart Disease: 9 %  Labs Reviewed: K+: 4.3 (06/12/2009) Creat: : 0.8 (06/12/2009)   Chol: 194 (06/12/2009)   HDL: 52.70 (06/12/2009)   LDL: 88 (06/08/2008)   TG: 280.0 (06/12/2009)  Complete Medication List: 1)   Pravachol 40 Mg Tabs (Pravastatin sodium) .... 2 tablets at bedtime 2)  Evista 60 Mg Tabs (Raloxifene hcl) .Marland Kitchen.. 1 daily by mouth 3)  Flonase 50 Mcg/act Susp (Fluticasone propionate) .Marland Kitchen.. 1 spray each nostril daily as needed 4)  Allegra 180 Mg Tabs (Fexofenadine hcl) .Marland Kitchen.. 1 once daily as needed 5)  Vicodin 5-500 Mg Tabs (Hydrocodone-acetaminophen) .Marland Kitchen.. 1 at bedtime as needed for pain 6)  Multivitamins Tabs (Multiple vitamin) .... Take 1 tablet by mouth once a day 7)  Calcium Carbonate-vitamin D 600-400 Mg-unit Tabs (Calcium carbonate-vitamin d) .... Take 1 tablet by mouth once a day 8)  Cyclobenzaprine Hcl 10 Mg Tabs (Cyclobenzaprine hcl) .... One tab by mouth three times a day as needed musc spasm 9)  Meloxicam 15 Mg Tabs (Meloxicam) .... One by mouth daily as needed 10)  Lisinopril 5 Mg Tabs (Lisinopril) .... One tab by mouth at night  Hypertension Assessment/Plan:      The patient's hypertensive risk group is category B: At least one risk factor (excluding diabetes) with no target organ damage.  Her calculated 10 year risk of coronary heart disease is 9 %.  Today's blood pressure is 158/80.    Patient Instructions: 1)  Bmet in 6weeks and again in 3 mos and then RTC in 3 mos for recheck., Prescriptions: LISINOPRIL 5 MG TABS (LISINOPRIL) one tab by mouth at night  #30 x 12   Entered and Authorized by:   Shaune Leeks MD   Signed by:   Shaune Leeks MD on 08/20/2009   Method used:   Electronically to        [facility] [address] (retail)       [address], [address]       [place], [place]  [address]       Ph: [phone]       Fax: [phone]   RxID:   [id number]   Current Allergies (reviewed today): ! CODEINE

## 2010-07-22 NOTE — Progress Notes (Signed)
Summary: RX Mobic  Phone Note Refill Request Call back at [phone] Message from:  [facility] [address] on July 01, 2009 8:27 AM  Received refill request for Mobic 15mg , take one by mouth every day with food. Medication reoved from med sheet 04/09.  When patient was seen 12/10 it was noted that he has to take medications for pulled muscle about every 6-7 months.   Method Requested: Electronic Initial call taken by: Sydell Axon LPN,  July 01, 2009 8:29 AM    New/Updated Medications: MELOXICAM 15 MG TABS (MELOXICAM) one by mouth daily Prescriptions: MELOXICAM 15 MG TABS (MELOXICAM) one by mouth daily  #30 x 3   Entered and Authorized by:   Ruthe Mannan MD   Signed by:   Ruthe Mannan MD on 07/01/2009   Method used:   Electronically to        [facility] [address] (retail)       [address], [address]       [place], [place]  [address]       Ph: [phone]       Fax: [phone]   RxID:   [id number]

## 2010-07-22 NOTE — Assessment & Plan Note (Signed)
Summary: F/U AFTER LABS / LFW   Vital Signs:  Patient profile:   65 year old female Weight:      161.75 pounds Temp:     98.2 degrees F oral Pulse rate:   64 / minute Pulse rhythm:   regular BP sitting:   122 / 78  (left arm) Cuff size:   regular  Vitals Entered By: Sydell Axon LPN (December 19, 2009 2:34 PM) CC: Follow-up after labs   History of Present Illness: Pt here for followup. She feels well and just got back from the [employer] in [place] with her entire family for a week in a six family home. She was careful with the sun using sunscreen and not burning.  She has not had many bananas but eats raisins every morning on her cereal. She feels well and has no complaints.  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 2 Tablets At Bedtime 2)  Evista 60 Mg  Tabs (Raloxifene Hcl) .Marland Kitchen.. 1 Daily By Mouth 3)  Flonase 50 Mcg/act  Susp (Fluticasone Propionate) .Marland Kitchen.. 1 Spray Each Nostril Daily As Needed 4)  Allegra 180 Mg  Tabs (Fexofenadine Hcl) .Marland Kitchen.. 1 Once Daily As Needed 5)  Vicodin 5-500 Mg Tabs (Hydrocodone-Acetaminophen) .Marland Kitchen.. 1 At Bedtime As Needed For Pain 6)  Multivitamins   Tabs (Multiple Vitamin) .... Take 1 Tablet By Mouth Once A Day 7)  Calcium Carbonate-Vitamin D 600-400 Mg-Unit  Tabs (Calcium Carbonate-Vitamin D) .... Take 1 Tablet By Mouth Once A Day 8)  Cyclobenzaprine Hcl 10 Mg Tabs (Cyclobenzaprine Hcl) .... One Tab By Mouth Three Times A Day As Needed Musc Spasm 9)  Meloxicam 15 Mg Tabs (Meloxicam) .... One By Mouth Daily As Needed 10)  Lisinopril  5 Mg Tabs (Lisinopril) .... One Tab By Mouth At Night  Allergies: 1)  ! Codeine  Physical Exam  General:  Well-developed,well-nourished,in no acute distress; alert,appropriate and cooperative throughout examination Head:  Normocephalic and atraumatic without obvious abnormalities. No apparent alopecia or balding. Sinuses NT. Eyes:  Conjunctiva clear bilaterally.  Ears:  External ear exam shows no significant lesions or deformities.  Otoscopic examination reveals clear canals, tympanic membranes are intact bilaterally without bulging, retraction, inflammation or discharge. Hearing is grossly normal bilaterally. Nose:  External nasal examination shows no deformity or inflammation. Nasal mucosa are pink and moist without lesions or exudates. Mouth:  mild erythema, otherwise normal. Neck:  No deformities, masses, or tenderness noted. Chest Wall:  No deformities, masses, or tenderness noted. Lungs:  Normal respiratory effort, chest expands symmetrically. Lungs are clear to auscultation, no crackles or wheezes. Heart:  Normal rate and regular rhythm. S1 and S2 normal without gallop, murmur, click, rub or other extra sounds.   Impression & Recommendations:  Problem # 1:  HYPERTENSION (ICD-401.9) Assessment Improved Good control. Will continue Lisinopril with lowered K.Reassess next time. Her updated medication list for this problem includes:    Lisinopril 5 Mg Tabs (Lisinopril) ..... One tab by mouth at night  BP today: 122/78 Prior BP: 138/74 (11/25/2009)  Prior 10 Yr Risk Heart Disease: 9 % (08/20/2009)  Labs Reviewed: K+: 4.9 (12/17/2009) Creat: : 0.7 (12/17/2009)   Chol: 194 (06/12/2009)   HDL: 52.70 (06/12/2009)   LDL: 88 (06/08/2008)   TG: 280.0 (06/12/2009)  Problem # 2:  HYPERGLYCEMIA (ICD-790.29) Assessment: Improved Euglycemic today with Potassium that is better altho still elevated. Avoiding raisins will help. Labs Reviewed: Creat: 0.7 (12/17/2009)     Complete  Medication List: 1)  Pravachol 40 Mg Tabs (Pravastatin sodium) .... 2 tablets at bedtime 2)  Evista 60 Mg Tabs (Raloxifene hcl) .Marland Kitchen.. 1 daily by mouth 3)  Flonase 50 Mcg/act Susp (Fluticasone propionate) .Marland Kitchen.. 1 spray each nostril daily as needed 4)  Allegra 180 Mg Tabs (Fexofenadine hcl) .Marland Kitchen.. 1 once daily as needed 5)  Vicodin 5-500 Mg Tabs (Hydrocodone-acetaminophen) .Marland Kitchen.. 1 at bedtime as needed for pain 6)  Multivitamins Tabs (Multiple vitamin) .... Take 1 tablet by mouth once a day 7)  Calcium Carbonate-vitamin D 600-400 Mg-unit Tabs (Calcium carbonate-vitamin d) .... Take 1 tablet by mouth once a day 8)  Cyclobenzaprine Hcl 10 Mg Tabs (Cyclobenzaprine hcl) .... One tab by mouth three times a day as needed musc spasm 9)  Meloxicam 15 Mg Tabs (Meloxicam) .... One by mouth daily as needed 10)  Lisinopril 5 Mg Tabs (Lisinopril) .... One tab by mouth at night  Patient Instructions: 1)  RTC 12/11 foer Comp Exam, labs prior.  Current Allergies (reviewed today): ! CODEINE

## 2010-07-22 NOTE — Progress Notes (Signed)
Summary: Rx Flexeril  Phone Note Refill Request Call back at [phone] Message from:  [facility] [address] on December 16, 2009 4:02 PM  Refills Requested: Medication #1:  CYCLOBENZAPRINE HCL 10 MG TABS one tab by mouth three times a day as needed musc spasm   Last Refilled: 06/18/2009  Method Requested: Electronic Initial call taken by: Sydell Axon LPN,  December 16, 2009 4:02 PM    Prescriptions: CYCLOBENZAPRINE HCL 10 MG TABS (CYCLOBENZAPRINE HCL) one tab by mouth three times a day as needed musc spasm  #60 x 0   Entered and Authorized by:   Shaune Leeks MD   Signed by:   Shaune Leeks MD on 12/16/2009   Method used:   Electronically to        [facility] [address] (retail)       [address], [address]       [place], [place]  [address]       Ph: [phone]       Fax: [phone]   RxID:   [id number]

## 2010-07-22 NOTE — Assessment & Plan Note (Signed)
Summary: PAIN IN SHOULDER/DLO   Vital Signs:  Patient Profile:   65 Years Old Female Height:     66.50 inches (168.91 cm) Weight:      159 pounds Temp:     98.3 degrees F oral Pulse rate:   68 / minute Pulse rhythm:   regular BP sitting:   140 / 70  (left arm) Cuff size:   regular  Vitals Entered By: Providence Crosby (July 27, 2007 11:21 AM)                 Chief Complaint:  ? PULLED MUSCLE 8 DAYS AGO NOT GETTING ANY BETTER// PAIN KEEPING HER AWAKE AT NIGHT.  History of Present Illness: No known episode but has signif discomfort in left upper back which is gone quickly in the AM , but quickly recurs at night after resting or stopping activity. Her discomfort is principally in the left upper Rhomboid area, now also involving upper left trapezial fibers and her left triceps. She has done no unusual activity, continues to do yoga, which she does regularly. There has been no echymosis or appreciable swelling. Topical heat and Icy Hot has helped. Sleep has been diufficult because of the intense discomfort at night.  Current Allergies (reviewed today): ! CODEINE      Physical Exam  General:     Well-developed,well-nourished,in no acute distress; alert,appropriate and cooperative throughout examination Head:     Normocephalic and atraumatic without obvious abnormalities. No apparent alopecia or balding. Eyes:     Conjunctiva clear bilaterally.  Ears:     External ear exam shows no significant lesions or deformities.  Otoscopic examination reveals clear canals, tympanic membranes are intact bilaterally without bulging, retraction, inflammation or discharge. Hearing is grossly normal bilaterally. Nose:     External nasal examination shows no deformity or inflammation. Nasal mucosa are pink and moist without lesions or exudates. Mouth:     Oral mucosa and oropharynx without lesions or exudates.  Teeth in good repair. Neck:     No deformities, masses, or tenderness noted. Chest  Wall:     No deformities, masses, or tenderness noted. Lungs:     Normal respiratory effort, chest expands symmetrically. Lungs are clear to auscultation, no crackles or wheezes. Heart:     Normal rate and regular rhythm. S1 and S2 normal without gallop, murmur, click, rub or other extra sounds. Msk:     Discofort in the area discribed in HPI, not terribly provoked by any particular movement...worst with ext rotation of the shoulder capsule.    Impression & Recommendations:  Problem # 1:  BACK STRAIN, UPPER LEFT AREA (ICD-847.9) Assessment: New Mobic, Flexeril as discussed, heat and ice as discussede. Vicodin as needed at night.  Complete Medication List: 1)  Pravachol 40 Mg Tabs (Pravastatin sodium) .... 2 tablets at bedtime 2)  Evista 60 Mg Tabs (Raloxifene hcl) .Marland Kitchen.. 1 q d 3)  Flonase 50 Mcg/act Susp (Fluticasone propionate) .Marland Kitchen.. 1 spray each nostril daily as needed 4)  Allegra 180 Mg Tabs (Fexofenadine hcl) .Marland Kitchen.. 1 once daily 5)  Mobic 15 Mg Tabs (Meloxicam) .... One tab by mouth after eating once a day. 6)  Flexeril 10 Mg Tabs (Cyclobenzaprine hcl) .... One tab by mouth three times a day 7)  Vicodin 5-500 Mg Tabs (Hydrocodone-acetaminophen) .... One tab by mouth hs as needed pain   Patient Instructions: 1)  Call in a couple weeks if not improved for Physical Therapy.    Prescriptions: VICODIN 5-500 MG  TABS (HYDROCODONE-ACETAMINOPHEN) one tab by mouth hs as needed pain  #20 x 0   Entered and Authorized by:   Shaune Leeks MD   Signed by:   Shaune Leeks MD on 07/27/2007   Method used:   Print then Give to Patient   RxID:   [id number] FLEXERIL 10 MG  TABS (CYCLOBENZAPRINE HCL) one tab by mouth three times a day  #50 x 1   Entered and Authorized by:   Shaune Leeks MD   Signed by:   Shaune Leeks MD on 07/27/2007   Method used:   Electronically sent to ...       [facility] [address]       [address]        [place], [place]  [address]       Ph: [phone]       Fax: [phone]   RxID:   [id number] MOBIC 15 MG  TABS (MELOXICAM) one tab by mouth after eating once a day.  #30 x 1   Entered and Authorized by:   Shaune Leeks MD   Signed by:   Shaune Leeks MD on 07/27/2007   Method used:   Electronically sent to ...       [facility] [address]       [address]       [place], [place]  [address]       Ph: [phone]       Fax: [phone]   RxID:   [id number]  ]

## 2010-07-24 NOTE — Letter (Signed)
Summary: Results Follow up Letter  [place] [facility]  [address] [place], [place] [address]   Phone: [phone]  Fax: [phone]    07/08/2010 MRN: [id number]    WILLIS KUIPERS [address] [place], [place]  [address]    Dear Ms. Mancinelli,  The following are the results of your recent test(s):  Test         Result    Pap Smear:        Normal _____  Not Normal _____ Comments: ______________________________________________________ Cholesterol: LDL(Bad cholesterol):         Your goal is less than:         HDL (Good cholesterol):       Your goal is more than: Comments:  ______________________________________________________ Mammogram:        Normal __X___  Not Normal _____ Comments: Please repeat in one year.  ___________________________________________________________________ Hemoccult:        Normal _____  Not normal _______ Comments:    _____________________________________________________________________ Other Tests:    We routinely do not discuss normal results over the telephone.  If you desire a copy of the results, or you have any questions about this information we can discuss them at your next office visit.   Sincerely,     Laurita Quint, MD

## 2010-07-24 NOTE — Letter (Signed)
Summary: Results Follow up Letter  [place] [facility]  [address] [place], [place] [address]   Phone: [phone]  Fax: [phone]    07/02/2010 MRN: [id number]    Desiree Ortega [address] [place], [place]  [address]    Dear Ms. Staggs,  The following are the results of your recent test(s):  Test         Result    Pap Smear:        Normal __X___  Not Normal _____ Comments: Please repeat in one year. ______________________________________________________ Cholesterol: LDL(Bad cholesterol):         Your goal is less than:         HDL (Good cholesterol):       Your goal is more than: Comments:  ______________________________________________________ Mammogram:        Normal _____  Not Normal _____ Comments:  ___________________________________________________________________ Hemoccult:        Normal _____  Not normal _______ Comments:    _____________________________________________________________________ Other Tests:    We routinely do not discuss normal results over the telephone.  If you desire a copy of the results, or you have any questions about this information we can discuss them at your next office visit.   Sincerely,   Laurita Quint, MD

## 2010-07-24 NOTE — Assessment & Plan Note (Signed)
Summary: CPX DR Hetty Ely PATIENT/[facility]   Vital Signs:  Patient profile:   65 year old female Weight:      160.75 pounds BMI:     26.04 Temp:     98.6 degrees F oral Pulse rate:   76 / minute Pulse rhythm:   regular BP sitting:   138 / 70  (left arm) Cuff size:   regular  Vitals Entered By: Sydell Axon LPN (June 19, 2010 11:09 AM) CC: 30 Minute checkup   History of Present Illness: Pt here for Comp Exam. No longer using raisins on her cereal but banana once a week. She feels well and has no complaints.  Preventive Screening-Counseling & Management  Alcohol-Tobacco     Alcohol drinks/day: 3 oz.     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 2 Tablets At Bedtime 2)  Evista 60 Mg  Tabs (Raloxifene Hcl) .Marland Kitchen.. 1 Daily By Mouth 3)  Flonase 50 Mcg/act  Susp (Fluticasone Propionate) .Marland Kitchen.. 1 Spray Each Nostril Daily As Needed 4)  Allegra 180 Mg  Tabs (Fexofenadine Hcl) .Marland Kitchen.. 1 Once Daily As Needed 5)  Vicodin 5-500 Mg Tabs (Hydrocodone-Acetaminophen) .Marland Kitchen.. 1 At Bedtime As Needed For Pain 6)  Multivitamins   Tabs (Multiple Vitamin) .... Take 1 Tablet By Mouth Once A Day 7)  Calcium Carbonate-Vitamin D 600-400 Mg-Unit  Tabs (Calcium Carbonate-Vitamin D) .... Take 1 Tablet By Mouth  Once A Day 8)  Cyclobenzaprine Hcl 10 Mg Tabs (Cyclobenzaprine Hcl) .... One Tab By Mouth Three Times A Day As Needed Musc Spasm 9)  Meloxicam 15 Mg Tabs (Meloxicam) .... One By Mouth Daily As Needed 10)  Lisinopril 5 Mg Tabs (Lisinopril) .... One Tab By Mouth At Night  Current Medications (verified): 1)  Pravachol 40 Mg  Tabs (Pravastatin Sodium) .... 2 Tablets At Bedtime 2)  Evista 60 Mg  Tabs (Raloxifene Hcl) .Marland Kitchen.. 1 Daily By Mouth 3)  Vicodin 5-500 Mg Tabs (Hydrocodone-Acetaminophen) .Marland Kitchen.. 1 At Bedtime As Needed For Pain 4)  Multivitamins   Tabs (Multiple Vitamin) .... Take 1 Tablet By Mouth Once A Day 5)  Calcium Carbonate-Vitamin D 600-400 Mg-Unit  Tabs (Calcium Carbonate-Vitamin D) .... Take 1 Tablet By Mouth Once A Day 6)  Cyclobenzaprine Hcl 10 Mg Tabs (Cyclobenzaprine Hcl) .... One Tab By Mouth Three Times A Day As Needed Musc Spasm 7)  Meloxicam 15 Mg Tabs (Meloxicam) .... One By Mouth Daily As Needed 8)  Lisinopril 5 Mg Tabs (Lisinopril) .... One Tab By Mouth At Night 9)  [facility]vs Daytime Sinus Relief 5-325 Mg Caps (Phenylephrine-Acetaminophen) .... As Needed  Allergies: 1)  ! Codeine  Past History:  Past Medical History: Last updated:  04/27/2007 Hyperlipidemia:(11/1997)  Family History: Last updated: 2010-07-19 Father:DECEASED 48 YOA HTN, AFIB,STROKE ,PVD STENTS LE'S  Mother: A  30  OSTEOPENIA BROTHER A 68  PROSTATE CANCER (PROSTATECTOMY) RADIATION 5 YRS LATER Hormone trmt BROTHER A 56  Prostate Cancer (Prostatectomy) CV: + FATHER PACER (AFIB) HBP: + FATHER DM: + BROTHERS CHILD GOUT/ARTHRITIS  PROSTATE CANCER: + BROTHER BREAST/OVARIAN/UTERINE CANCER: NEG, +GM NON HODGKINS LYMPHOMA COLON CANCER: NEGATIVE DEPRESSION: + DAUGHTER ETOH/DRUG ABUSE: NEGATIVE OTHER :+STROKE FATHER (BLEED)   Social History: Last updated: 06/06/2007 Marital Status: Married LIVES WITH HUSBAND Children: 4 CHILDREN 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 [place]  Risk  Factors: Alcohol Use: 3 oz. (07/19/10) Caffeine Use: 1 (07/19/10) Exercise: yes (July 19, 2010)  Risk Factors: Smoking Status: quit (Jul 19, 2010) Packs/Day: 2005 (2010-07-19) Passive Smoke Exposure: no (19-Jul-2010)  Past Surgical History: TONSILLECTOMY 1950 BTL 1980s BIRTHMARK REMOVAL  BIRTH     [facility] REMOVED 1988 DEXA SCAN -  (1995) DEXA SCAN:- OSTEOPENIA:(06/07/2006)  Family History: Father:DECEASED 64 YOA HTN, AFIB,STROKE ,PVD STENTS LE'S  Mother: A  43  OSTEOPENIA BROTHER A 68  PROSTATE CANCER (PROSTATECTOMY) RADIATION 5 YRS LATER Hormone trmt BROTHER A 56  Prostate Cancer (Prostatectomy) CV: + FATHER PACER (AFIB) HBP: + FATHER DM: + BROTHERS CHILD GOUT/ARTHRITIS  PROSTATE CANCER: + BROTHER BREAST/OVARIAN/UTERINE CANCER: NEG, +GM NON HODGKINS LYMPHOMA COLON CANCER: NEGATIVE DEPRESSION: + DAUGHTER ETOH/DRUG ABUSE: NEGATIVE OTHER :+STROKE FATHER (BLEED)   Review of Systems General:  Denies chills, fatigue, fever, sweats, weakness, and weight loss. Eyes:  Denies blurring, discharge, eye pain, and itching. ENT:  Denies decreased hearing, earache, and ringing in ears. CV:  Denies chest pain or discomfort, fainting, fatigue, palpitations, shortness of breath with exertion, swelling of feet, and swelling of hands. Resp:  Complains of cough; denies shortness of breath and wheezing. GI:  Denies abdominal pain, bloody stools, change in bowel habits, constipation, dark tarry stools, diarrhea, indigestion, loss of appetite, nausea, vomiting, vomiting blood, and yellowish skin color. GU:  Denies discharge, dysuria, nocturia, and urinary frequency. MS:  Complains of cramps; denies joint pain, low back pain, muscle aches, and stiffness. Derm:  Denies dryness, itching, and rash. Neuro:  Denies numbness, poor balance, tingling, and tremors.  Physical Exam  General:  Well-developed,well-nourished,in no acute distress; alert,appropriate and cooperative throughout examination Head:   Normocephalic and atraumatic without obvious abnormalities. No apparent alopecia or balding. Sinuses NT. Eyes:  Conjunctiva clear bilaterally.  Ears:  External ear exam shows no significant lesions or deformities.  Otoscopic examination reveals clear canals, tympanic membranes are intact bilaterally without bulging, retraction, inflammation or discharge. Hearing is grossly normal bilaterally. Nose:  External nasal examination shows no deformity or inflammation. Nasal mucosa are pink and moist without lesions or exudates. Mouth:  mild erythema, otherwise normal. Neck:  No deformities, masses, or tenderness noted. Chest Wall:  No deformities, masses, or tenderness noted. Breasts:  No mass, nodules, thickening, tenderness, bulging, retraction, inflamation, nipple discharge or skin changes noted.   Lungs:  Normal respiratory effort, chest expands symmetrically. Lungs are clear to auscultation, no crackles or wheezes. Heart:  Normal rate and regular rhythm. S1 and S2 normal without gallop, murmur, click, rub or other extra sounds. Abdomen:  Bowel sounds positive,abdomen soft and non-tender without masses, organomegaly or hernias noted. Rectal:  No external abnormalities noted. Normal sphincter tone. No rectal masses or tenderness. G neg. Genitalia:  Pelvic Exam:        External: normal female genitalia without lesions or masses        Vagina:  normal without lesions or masses        Cervix: normal without lesions or masses        Adnexa: normal bimanual exam without masses or fullness        Uterus: normal by palpation        Pap smear: performed Msk:  No deformity or scoliosis noted of thoracic or lumbar spine.   Pulses:  R and L carotid,radial,femoral,dorsalis pedis and posterior tibial pulses are full and equal bilaterally Extremities:  No clubbing, cyanosis, edema, or deformity noted with normal full range of motion of all joints.   Neurologic:  No cranial nerve deficits noted. Station and gait  are normal. Sensory, motor and coordinative functions appear intact. Skin:  Intact without suspicious lesions or rashes Cervical Nodes:  No lymphadenopathy noted Axillary Nodes:  No palpable lymphadenopathy Inguinal Nodes:  No significant adenopathy Psych:  Cognition and judgment appear intact. Alert and cooperative with normal attention span and concentration. No apparent delusions, illusions, hallucinations   Impression & Recommendations:  Problem # 1:  HEALTH MAINTENANCE EXAM (ICD-V70.0) Assessment Comment Only I have personally reviewed the Medicare Annual Wellness questionnaire and have noted 1.   The patient's medical and social history 2.   Their use of alcohol, tobacco or illicit drugs 3.   Their current medications and supplements 4.   The patient's functional ability including ADL's, fall risks, home safety risks and hearing or visual             impairment. 5.   Diet and physical activities 6.   Evidence for depression or mood disorders   Problem # 2:  HYPERTENSION (ICD-401.9) Assessment: Unchanged Adequate. Will cont Lisinopril. Has cough but not enough to merit changing med by her decision. Cont curr dose. Her updated medication list for this problem includes:    Lisinopril 5 Mg Tabs (Lisinopril) ..... One tab by mouth at night  BP today: 138/70 Prior BP: 122/78 (12/19/2009)  Prior 10 Yr Risk Heart Disease: 9 % (08/20/2009)  Labs Reviewed: K+: 4.4 (06/17/2010) Creat: : 0.6 (06/17/2010)   Chol: 191 (06/17/2010)   HDL: 55.10 (06/17/2010)   LDL: 111 (06/17/2010)   TG: 124.0 (06/17/2010)  Problem # 3:  HYPERGLYCEMIA (ICD-790.29) Assessment: Improved Markedly euglycemic today.  Problem # 4:  HYPERLIPIDEMIA (ICD-272.4) Assessment: Unchanged  Stable, cont curr med. Her updated medication list for this problem includes:    Pravachol 40 Mg Tabs (Pravastatin sodium) .Marland Kitchen... 2 tablets at bedtime  Labs Reviewed: SGOT: 19 (06/17/2010)   SGPT: 15 (06/17/2010)  Prior 10  Yr Risk Heart Disease: 9 % (08/20/2009)   HDL:55.10 (06/17/2010), 52.70 (06/12/2009)  LDL:111 (06/17/2010), 88 (16/03/9603)  Chol:191 (06/17/2010), 194 (06/12/2009)  Trig:124.0 (06/17/2010), 280.0 (06/12/2009)  Problem # 5:  ALLERGY, ENVIRONMENTAL (ICD-995.3) Assessment: Unchanged Stable.  Complete Medication List: 1)  Pravachol 40 Mg Tabs (Pravastatin sodium) .... 2 tablets at bedtime 2)  Evista 60 Mg Tabs (Raloxifene hcl) .Marland Kitchen.. 1 daily by mouth 3)  Multivitamins Tabs (Multiple vitamin) .... Take 1 tablet by mouth once a day 4)  Calcium Carbonate-vitamin D 600-400 Mg-unit Tabs (Calcium carbonate-vitamin d) .... Take 1 tablet by mouth once a day 5)  Cyclobenzaprine Hcl 10 Mg Tabs (Cyclobenzaprine hcl) .... One tab by mouth three times a day as needed musc spasm 6)  Lisinopril 5 Mg Tabs (Lisinopril) .... One tab by mouth at night 7)  [facility]vs Daytime Sinus Relief 5-325 Mg Caps (Phenylephrine-acetaminophen) .... As needed  Other Orders: Radiology Referral (Radiology)  Patient Instructions:  1)  Refer for mammo. 2)  RTC as needed.   Orders Added: 1)  Radiology Referral [Radiology]    Current Allergies (reviewed today): ! CODEINE

## 2010-09-29 ENCOUNTER — Other Ambulatory Visit: Payer: Self-pay | Admitting: Family Medicine

## 2010-11-17 ENCOUNTER — Other Ambulatory Visit: Payer: Self-pay | Admitting: Family Medicine

## 2010-12-21 ENCOUNTER — Other Ambulatory Visit: Payer: Self-pay | Admitting: Family Medicine

## 2011-02-16 ENCOUNTER — Encounter: Payer: Self-pay | Admitting: Family Medicine

## 2011-06-09 ENCOUNTER — Other Ambulatory Visit: Payer: Self-pay | Admitting: Family Medicine

## 2011-06-09 DIAGNOSIS — M8580 Other specified disorders of bone density and structure, unspecified site: Secondary | ICD-10-CM

## 2011-06-09 DIAGNOSIS — E7800 Pure hypercholesterolemia, unspecified: Secondary | ICD-10-CM

## 2011-06-19 ENCOUNTER — Other Ambulatory Visit (INDEPENDENT_AMBULATORY_CARE_PROVIDER_SITE_OTHER): Payer: Medicare Other

## 2011-06-19 DIAGNOSIS — E7800 Pure hypercholesterolemia, unspecified: Secondary | ICD-10-CM

## 2011-06-19 DIAGNOSIS — M8580 Other specified disorders of bone density and structure, unspecified site: Secondary | ICD-10-CM

## 2011-06-19 LAB — COMPREHENSIVE METABOLIC PANEL
ALT: 12 U/L (ref 0–35)
AST: 15 U/L (ref 0–37)
Calcium: 8.9 mg/dL (ref 8.4–10.5)
Chloride: 105 mEq/L (ref 96–112)
Creatinine, Ser: 0.9 mg/dL (ref 0.4–1.2)
Total Bilirubin: 0.4 mg/dL (ref 0.3–1.2)

## 2011-06-19 LAB — LIPID PANEL
LDL Cholesterol: 108 mg/dL — ABNORMAL HIGH (ref 0–99)
Total CHOL/HDL Ratio: 3
Triglycerides: 96 mg/dL (ref 0.0–149.0)

## 2011-06-20 LAB — VITAMIN D 25 HYDROXY (VIT D DEFICIENCY, FRACTURES): Vit D, 25-Hydroxy: 40 ng/mL (ref 30–89)

## 2011-06-24 ENCOUNTER — Encounter: Payer: Self-pay | Admitting: Family Medicine

## 2011-06-25 ENCOUNTER — Ambulatory Visit (INDEPENDENT_AMBULATORY_CARE_PROVIDER_SITE_OTHER): Payer: Medicare Other | Admitting: Family Medicine

## 2011-06-25 ENCOUNTER — Encounter: Payer: Self-pay | Admitting: Family Medicine

## 2011-06-25 DIAGNOSIS — Z1211 Encounter for screening for malignant neoplasm of colon: Secondary | ICD-10-CM

## 2011-06-25 DIAGNOSIS — Z1239 Encounter for other screening for malignant neoplasm of breast: Secondary | ICD-10-CM

## 2011-06-25 DIAGNOSIS — E785 Hyperlipidemia, unspecified: Secondary | ICD-10-CM

## 2011-06-25 DIAGNOSIS — Z1231 Encounter for screening mammogram for malignant neoplasm of breast: Secondary | ICD-10-CM

## 2011-06-25 DIAGNOSIS — I10 Essential (primary) hypertension: Secondary | ICD-10-CM

## 2011-06-25 DIAGNOSIS — M8580 Other specified disorders of bone density and structure, unspecified site: Secondary | ICD-10-CM

## 2011-06-25 DIAGNOSIS — Z23 Encounter for immunization: Secondary | ICD-10-CM

## 2011-06-25 DIAGNOSIS — M899 Disorder of bone, unspecified: Secondary | ICD-10-CM

## 2011-06-25 MED ORDER — PRAVASTATIN SODIUM 80 MG PO TABS: 80.0000 mg | ORAL_TABLET | Freq: Every day | ORAL | Status: DC

## 2011-06-25 MED ORDER — LISINOPRIL 5 MG PO TABS: 5.0000 mg | ORAL_TABLET | Freq: Every day | ORAL | Status: DC

## 2011-06-25 NOTE — Progress Notes (Signed)
Osteopenia- Due for redheck DXA.  No ADE on the medicine.  Compliant.  Prev was on fosamax.    Hypertension:    Using medication without problems or lightheadedness: yes Chest pain with exertion:no Edema:no Short of breath:no Occ cough noted, but this is improved recently.   Elevated Cholesterol: Using medications without problems:yes Muscle aches: no Diet compliance: healthy diet Exercise:yes, frequent Other complaints: no  Colon cancer screening.  See plan.   No indication for pap.    Meds, vitals, and allergies reviewed.   PMH and SH reviewed  ROS: See HPI.  Otherwise negative.    GEN: nad, alert and oriented HEENT: mucous membranes moist NECK: supple w/o LA CV: rrr. PULM: ctab, no inc wob ABD: soft, +bs EXT: no edema SKIN: no acute rash Breast exam: No mass, nodules, thickening, tenderness, bulging, retraction, inflamation, nipple discharge or skin changes noted.  No axillary or clavicular LA.  Chaperoned exam.

## 2011-06-25 NOTE — Patient Instructions (Addendum)
Check with your insurance to see if they will cover the shingles shot. I would get a flu shot each fall.   See Shir[facility] about your referral before you leave today. Take care and keep exercising.  Glad to see you today.

## 2011-06-26 ENCOUNTER — Encounter: Payer: Self-pay | Admitting: Family Medicine

## 2011-06-26 DIAGNOSIS — Z1211 Encounter for screening for malignant neoplasm of colon: Secondary | ICD-10-CM | POA: Insufficient documentation

## 2011-06-26 DIAGNOSIS — M810 Age-related osteoporosis without current pathological fracture: Secondary | ICD-10-CM | POA: Insufficient documentation

## 2011-06-26 DIAGNOSIS — Z1239 Encounter for other screening for malignant neoplasm of breast: Secondary | ICD-10-CM | POA: Insufficient documentation

## 2011-06-26 DIAGNOSIS — M8580 Other specified disorders of bone density and structure, unspecified site: Secondary | ICD-10-CM | POA: Insufficient documentation

## 2011-06-26 NOTE — Assessment & Plan Note (Signed)
Controlled, labs d/w pt, no change in meds.  Continue diet and exercise.

## 2011-06-26 NOTE — Assessment & Plan Note (Signed)
Normal exam, refer for mammogram

## 2011-06-26 NOTE — Assessment & Plan Note (Signed)
Controlled, labs d/w pt, no change in meds.  Continue diet and exercise.  

## 2011-06-26 NOTE — Assessment & Plan Note (Signed)
D/w patient re:options for colon cancer screening, including IFOB vs. colonoscopy.  Risks and benefits of both were discussed and patient voiced understanding.  Pt elects for:IFOB  

## 2011-06-26 NOTE — Assessment & Plan Note (Signed)
Refer for DXA.  

## 2011-06-30 ENCOUNTER — Ambulatory Visit: Payer: Self-pay | Admitting: Family Medicine

## 2011-06-30 ENCOUNTER — Other Ambulatory Visit: Payer: Self-pay | Admitting: Family Medicine

## 2011-06-30 ENCOUNTER — Other Ambulatory Visit: Payer: MEDICARE

## 2011-06-30 DIAGNOSIS — Z1289 Encounter for screening for malignant neoplasm of other sites: Secondary | ICD-10-CM

## 2011-06-30 LAB — FECAL OCCULT BLOOD, IMMUNOCHEMICAL: Fecal Occult Bld: NEGATIVE

## 2011-07-01 ENCOUNTER — Telehealth: Payer: Self-pay | Admitting: Internal Medicine

## 2011-07-01 ENCOUNTER — Encounter: Payer: Self-pay | Admitting: Family Medicine

## 2011-07-01 NOTE — Telephone Encounter (Signed)
Patient came into [facility] to have a Bone density test for osteopenia and they need an order faxed to them.

## 2011-07-01 NOTE — Telephone Encounter (Signed)
Please fax order for bone density, dx osteopenia.

## 2011-07-03 ENCOUNTER — Encounter: Payer: Self-pay | Admitting: *Deleted

## 2011-07-03 NOTE — Telephone Encounter (Signed)
Order faxed.

## 2011-07-06 ENCOUNTER — Encounter: Payer: Self-pay | Admitting: *Deleted

## 2011-07-13 ENCOUNTER — Ambulatory Visit (INDEPENDENT_AMBULATORY_CARE_PROVIDER_SITE_OTHER): Payer: MEDICARE | Admitting: Family Medicine

## 2011-07-13 ENCOUNTER — Encounter: Payer: Self-pay | Admitting: Family Medicine

## 2011-07-13 DIAGNOSIS — M899 Disorder of bone, unspecified: Secondary | ICD-10-CM

## 2011-07-13 DIAGNOSIS — M8580 Other specified disorders of bone density and structure, unspecified site: Secondary | ICD-10-CM

## 2011-07-13 DIAGNOSIS — I10 Essential (primary) hypertension: Secondary | ICD-10-CM

## 2011-07-13 MED ORDER — LISINOPRIL 5 MG PO TABS: 5.0000 mg | ORAL_TABLET | Freq: Every day | ORAL | Status: DC

## 2011-07-13 NOTE — Patient Instructions (Addendum)
Stop the evista and hold the lisinopril for now.  If your BP is >130/90 then restart the medicine.   We can recheck your bone density in 2 years.  Stay on the vitamin D and keep exercising.   Glad to see you.  Take care.

## 2011-07-14 ENCOUNTER — Encounter: Payer: Self-pay | Admitting: Family Medicine

## 2011-07-14 NOTE — Progress Notes (Signed)
HTN.  Pt has been exercising.  Last few visits with lower BP, would like to stop ACE.  See instructions.  We talked about options.    Osteopenia.  Recent DXA done. Pprev on fosamax for a few years then started evista at age ~60, T score improved  from -2.29/-1.96 to -1.8/-1.7 as of 06/2011.  There was lower radius score on last eval but there was no prev comparison on this.  She's been on meds for years and we discussed options.  No h/o fragility fx.  Intolerant of fosamax.    DXA d/w pt.    PMH and SH reviewed  ROS: See HPI, otherwise noncontributory.  Meds, vitals, and allergies reviewed.   GEN: nad, alert and oriented HEENT: mucous membranes moist NECK: supple w/o LA CV: rrr.  PULM: ctab, no inc wob

## 2011-07-14 NOTE — Assessment & Plan Note (Signed)
>  25 min spent with face to face with patient, >50% counseling and/or coordinating care.  Stop evista, continue exercise, Calcium and vit D. Repeat DXA in 2 years.  I don't think the radius score is significant given her improvement in L spine and hip scoring.  We talked about options and she'd like to stop rx meds for now.  I support this.

## 2011-07-14 NOTE — Assessment & Plan Note (Signed)
Stop ACE and she'll monitor BP.  I left it on the med list for now.

## 2011-08-04 ENCOUNTER — Ambulatory Visit: Payer: Self-pay | Admitting: Family Medicine

## 2011-08-04 IMAGING — MG MAM DGTL SCRN MAM NO ORDER W/CAD
1 series · 4 of 4 positions shown · non-contrast
Comparison: none

REASON FOR EXAM: SCR MAMMO NO ORDER
COMMENTS:

[L CC · left · 4 of 4 slices shown]
[im 1/4]
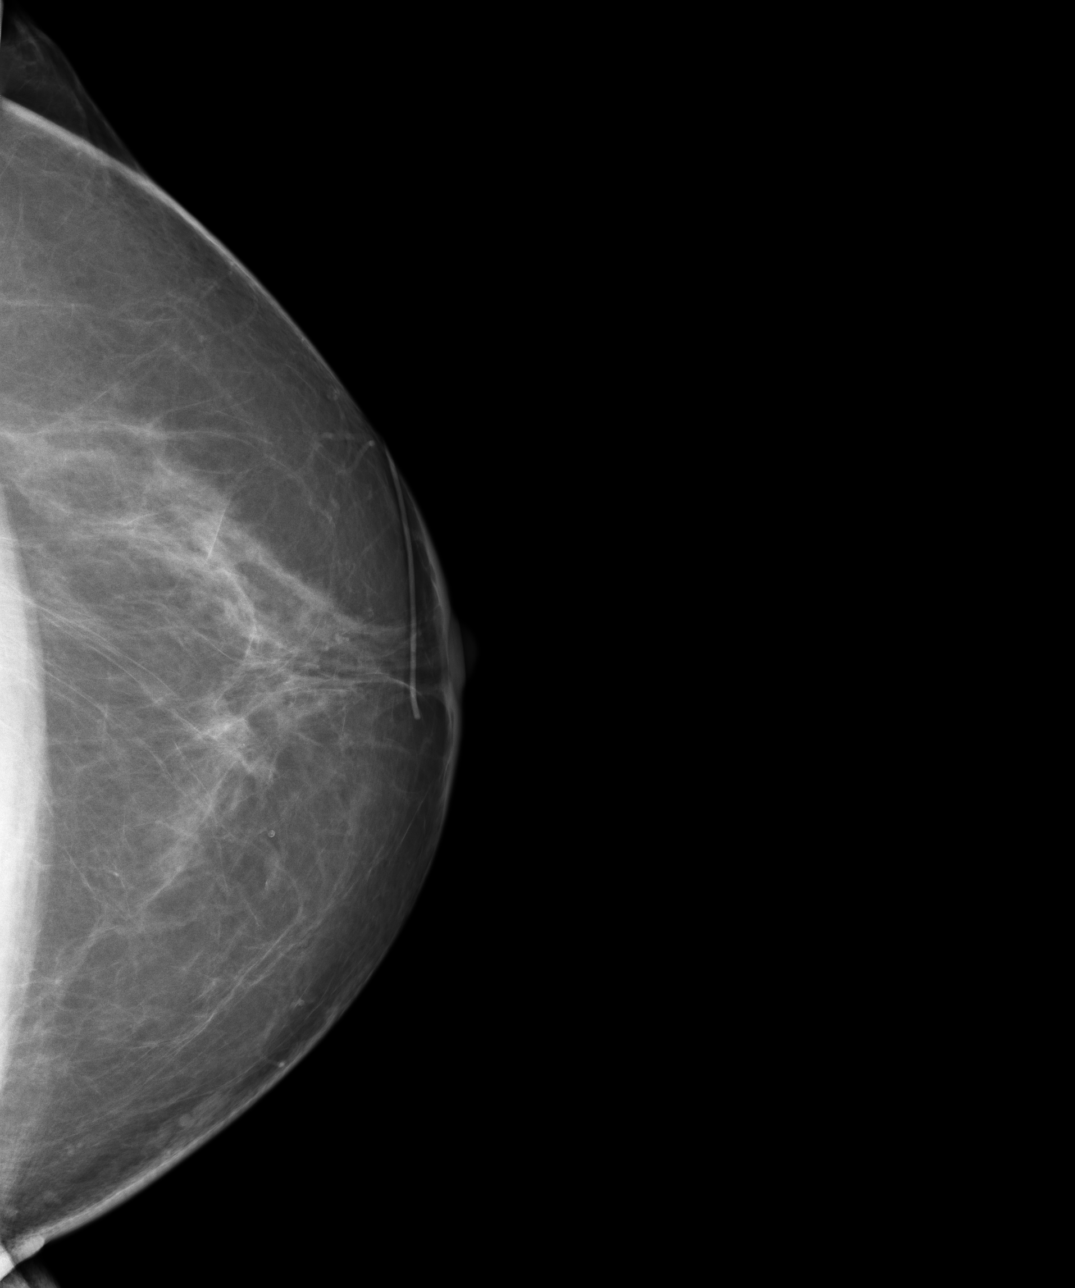
[im 2/4]
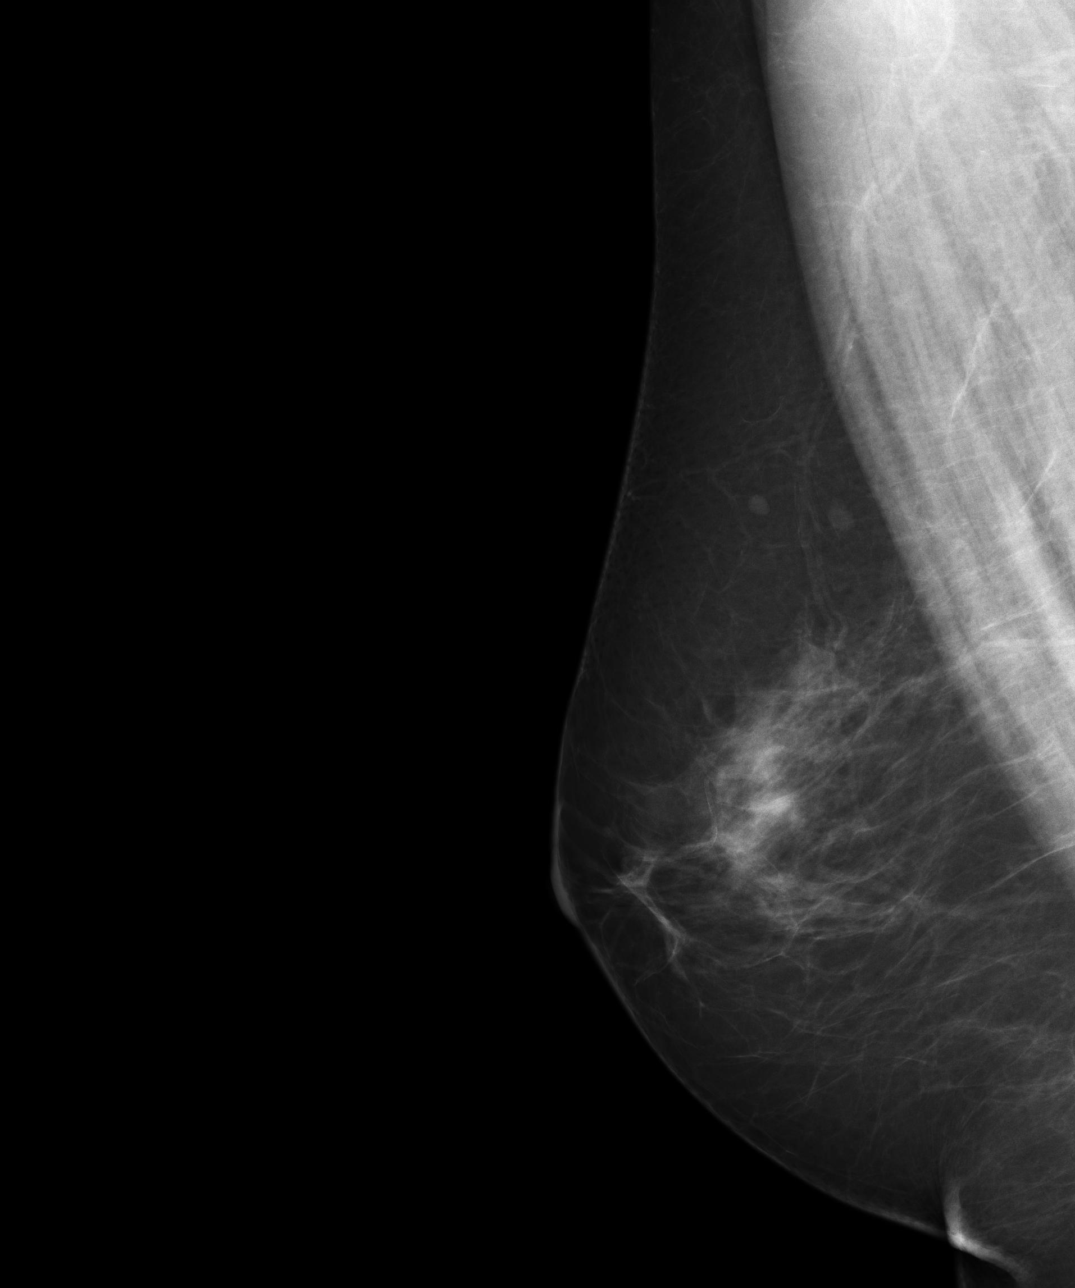
[im 3/4]
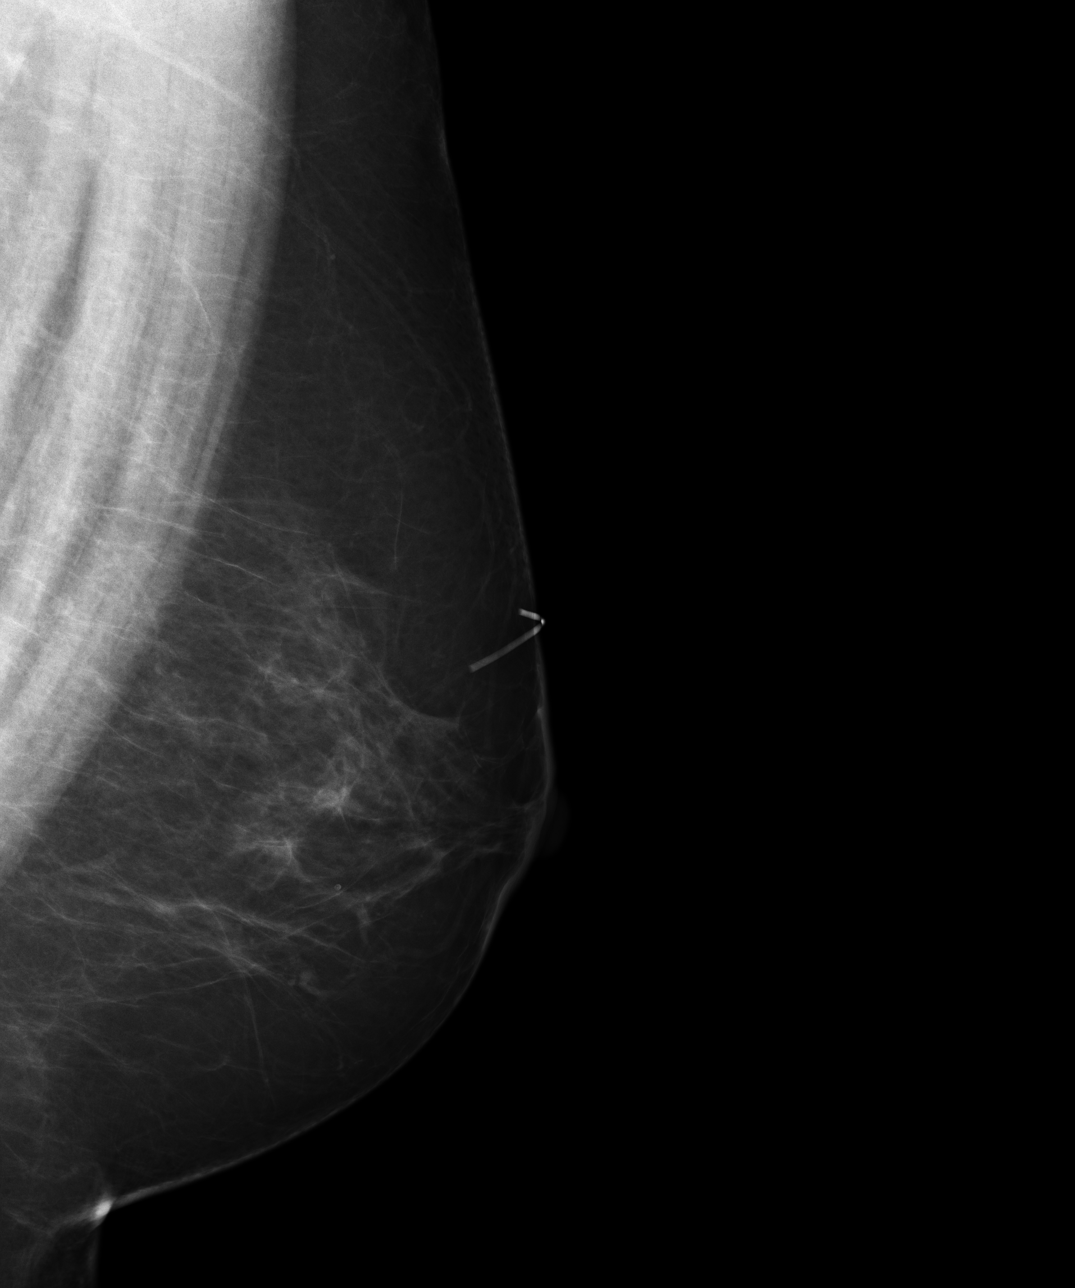
[im 4/4]
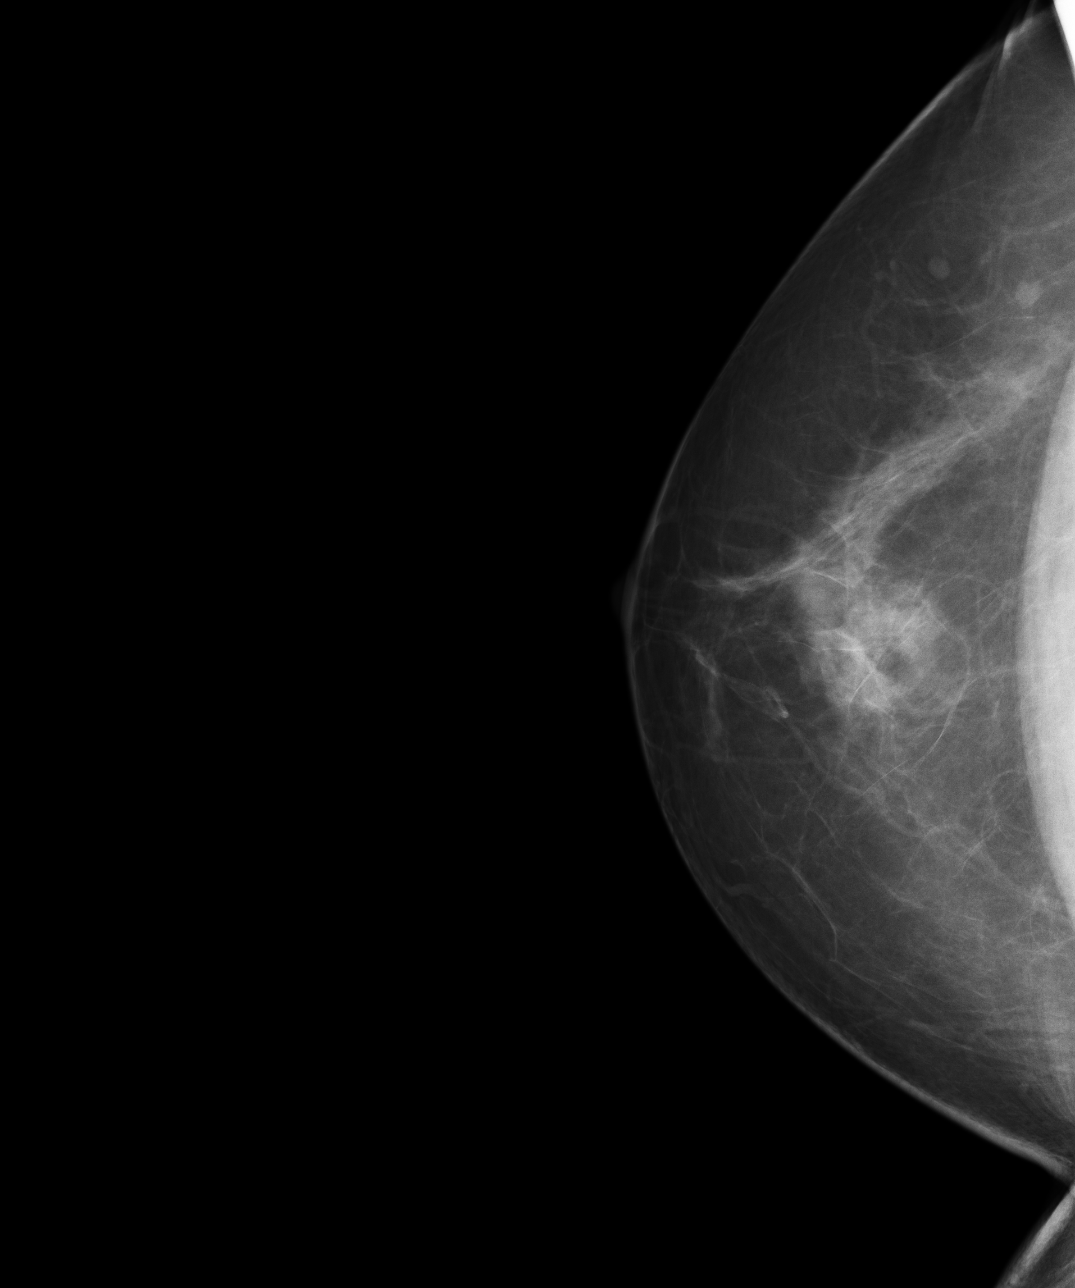

[4 of 4 positions shown; findings below may reference images not displayed]

PROCEDURE:     MAM - MAM DGTL SCRN MAM NO ORDER W/CAD  - [DATE] [DATE]

RESULT:     Comparison is made to the previous exam of [DATE],
which is an analog study, and to digital images dated [DATE], as
well as [DATE].  The breasts exhibit a moderately dense parenchymal
pattern with some stable nodular parenchymal density laterally in the upper
mid to posterior right breast. There is a upper outer anterior left breast
scar marker. There is no evidence of an area of developing parenchymal
density or dominant mass. The mammographic appearance is stable.
IMPRESSION: 1.Stable, benign appearing bilateral mammogram.

BI-RADS: Category 2 - Benign Findings

RECOMMENDATIONS:

1.     Please continue to encourage yearly mammographic follow-up.

A NEGATIVE MAMMOGRAM REPORT DOES NOT PRECLUDE BIOPSY OR OTHER EVALUATION OF
A CLINICALLY PALPABLE OR OTHERWISE SUSPICIOUS MASS OR LESION. BREAST CANCER
MAY NOT BE DETECTED BY MAMMOGRAPHY IN UP TO 10% OF CASES.

## 2011-08-05 ENCOUNTER — Encounter: Payer: Self-pay | Admitting: Family Medicine

## 2012-06-22 ENCOUNTER — Other Ambulatory Visit: Payer: Self-pay | Admitting: Family Medicine

## 2012-06-22 DIAGNOSIS — E7800 Pure hypercholesterolemia, unspecified: Secondary | ICD-10-CM

## 2012-06-22 DIAGNOSIS — M8580 Other specified disorders of bone density and structure, unspecified site: Secondary | ICD-10-CM

## 2012-06-27 ENCOUNTER — Other Ambulatory Visit (INDEPENDENT_AMBULATORY_CARE_PROVIDER_SITE_OTHER): Payer: MEDICARE

## 2012-06-27 DIAGNOSIS — E7800 Pure hypercholesterolemia, unspecified: Secondary | ICD-10-CM

## 2012-06-27 DIAGNOSIS — M8580 Other specified disorders of bone density and structure, unspecified site: Secondary | ICD-10-CM

## 2012-06-27 LAB — LIPID PANEL
LDL Cholesterol: 94 mg/dL (ref 0–99)
Total CHOL/HDL Ratio: 3
VLDL: 15.2 mg/dL (ref 0.0–40.0)

## 2012-06-27 LAB — COMPREHENSIVE METABOLIC PANEL
ALT: 13 U/L (ref 0–35)
AST: 19 U/L (ref 0–37)
Albumin: 4 g/dL (ref 3.5–5.2)
Alkaline Phosphatase: 58 U/L (ref 39–117)
Calcium: 9.3 mg/dL (ref 8.4–10.5)
Chloride: 105 mEq/L (ref 96–112)
Potassium: 4.6 mEq/L (ref 3.5–5.1)

## 2012-07-01 ENCOUNTER — Ambulatory Visit (INDEPENDENT_AMBULATORY_CARE_PROVIDER_SITE_OTHER): Payer: MEDICARE | Admitting: Family Medicine

## 2012-07-01 ENCOUNTER — Encounter: Payer: Self-pay | Admitting: Family Medicine

## 2012-07-01 VITALS — BP 126/70 | HR 61 | Temp 97.7°F | Ht 66.0 in | Wt 140.8 lb

## 2012-07-01 DIAGNOSIS — E7800 Pure hypercholesterolemia, unspecified: Secondary | ICD-10-CM

## 2012-07-01 DIAGNOSIS — Z0000 Encounter for general adult medical examination without abnormal findings: Secondary | ICD-10-CM

## 2012-07-01 DIAGNOSIS — E785 Hyperlipidemia, unspecified: Secondary | ICD-10-CM

## 2012-07-01 DIAGNOSIS — I10 Essential (primary) hypertension: Secondary | ICD-10-CM

## 2012-07-01 DIAGNOSIS — Z23 Encounter for immunization: Secondary | ICD-10-CM

## 2012-07-01 MED ORDER — LISINOPRIL 5 MG PO TABS: 5.0000 mg | ORAL_TABLET | Freq: Every day | ORAL | Status: DC

## 2012-07-01 MED ORDER — PRAVASTATIN SODIUM 80 MG PO TABS: 40.0000 mg | ORAL_TABLET | Freq: Every day | ORAL | Status: DC

## 2012-07-01 NOTE — Patient Instructions (Addendum)
Call [facility] about your mammogram. Check with your insurance to see if they will cover the shingles shot. Cut the pravastatin to 40mg  a day and we'll recheck your lipids in 2 months.

## 2012-07-03 DIAGNOSIS — Z0000 Encounter for general adult medical examination without abnormal findings: Secondary | ICD-10-CM | POA: Insufficient documentation

## 2012-07-03 NOTE — Assessment & Plan Note (Signed)
Controlled, cut statin in half and recheck in 8 weeks.

## 2012-07-03 NOTE — Assessment & Plan Note (Signed)
Controlled, continue current meds.   

## 2012-07-03 NOTE — Assessment & Plan Note (Addendum)
See scanned forms.  Routine anticipatory guidance given to patient.  See health maintenance. Flu 2014 Shingles d/w pt.  PNA 2013 Tetanus 2009 D/w patient [id number] for colon cancer screening, including IFOB vs. colonoscopy.  Risks and benefits of both were discussed and patient voiced understanding.  Pt elects [id number] Breast cancer screening mammogram pending.   Pap not indicated.  Repeat DXA not due.  Cognitive function addressed- see scanned forms- and if abnormal then additional documentation follows.

## 2012-07-03 NOTE — Progress Notes (Signed)
I have personally reviewed the Medicare Annual Wellness questionnaire and have noted 1. The patient's medical and social history 2. Their use of alcohol, tobacco or illicit drugs 3. Their current medications and supplements 4. The patient's functional ability including ADL's, fall risks, home safety risks and hearing or visual             impairment. 5. Diet and physical activities 6. Evidence for depression or mood disorders  The patients weight, height, BMI have been recorded in the chart and visual acuity is per eye clinic.  I have made referrals, counseling and provided education to the patient based review of the above and I have provided the pt with a written personalized care plan for preventive services.  See scanned forms.  Routine anticipatory guidance given to patient.  See health maintenance. Flu 2014 Shingles d/w pt.  PNA 2013 Tetanus 2009 D/w patient [id number] for colon cancer screening, including IFOB vs. colonoscopy.  Risks and benefits of both were discussed and patient voiced understanding.  Pt elects [id number] Breast cancer screening mammogram pending.   Pap not indicated.  Cognitive function addressed- see scanned forms- and if abnormal then additional documentation follows.   Elevated Cholesterol: Using medications without problems:yes Muscle aches: n Diet compliance:y Exercise:y Intentional weight loss noted  Hypertension:    Using medication without problems or lightheadedness: y Chest pain with exertion:n Edema:n Short of breath:n  PMH and SH reviewed  Meds, vitals, and allergies reviewed.   ROS: See HPI.  Otherwise negative.    GEN: nad, alert and oriented HEENT: mucous membranes moist NECK: supple w/o LA CV: rrr. PULM: ctab, no inc wob ABD: soft, +bs EXT: no edema SKIN: no acute rash but benign feeling lipoma noted on L shoulder Breast exam: No mass, nodules, thickening, tenderness, bulging, retraction, inflamation, nipple discharge or skin  changes noted.  No axillary or clavicular LA.  Chaperoned exam.

## 2012-08-05 ENCOUNTER — Ambulatory Visit: Payer: Self-pay | Admitting: Family Medicine

## 2012-08-05 IMAGING — MG MAM DGTL SCRN MAM NO ORDER W/CAD
1 series · 5 of 5 positions shown · non-contrast
Comparison: none

REASON FOR EXAM: SCR MAMMO NO ORDER
COMMENTS:

PROCEDURE:     MAM - MAM DGTL SCRN MAM NO ORDER W/CAD  - [DATE] [DATE]
RESULT:
Comparisons: [DATE], [DATE], [DATE], [DATE], and [DATE].

[R CC · right · 5 of 5 slices shown]
[im 1/5]
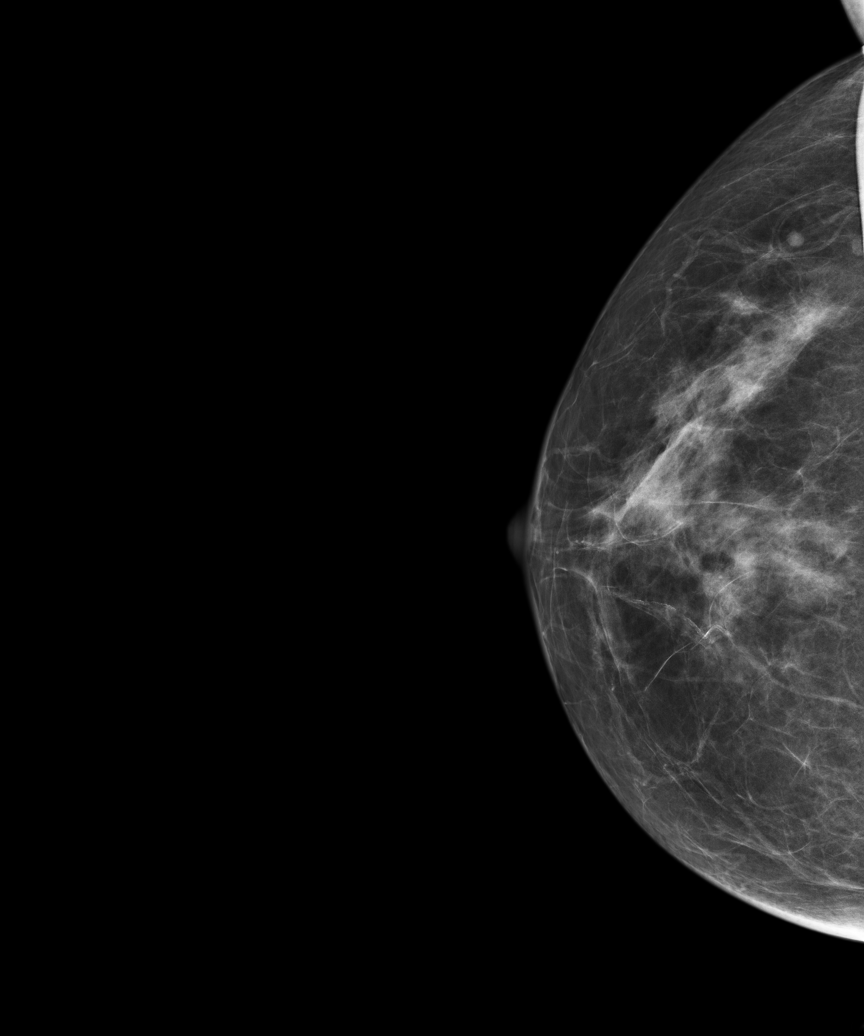
[im 2/5]
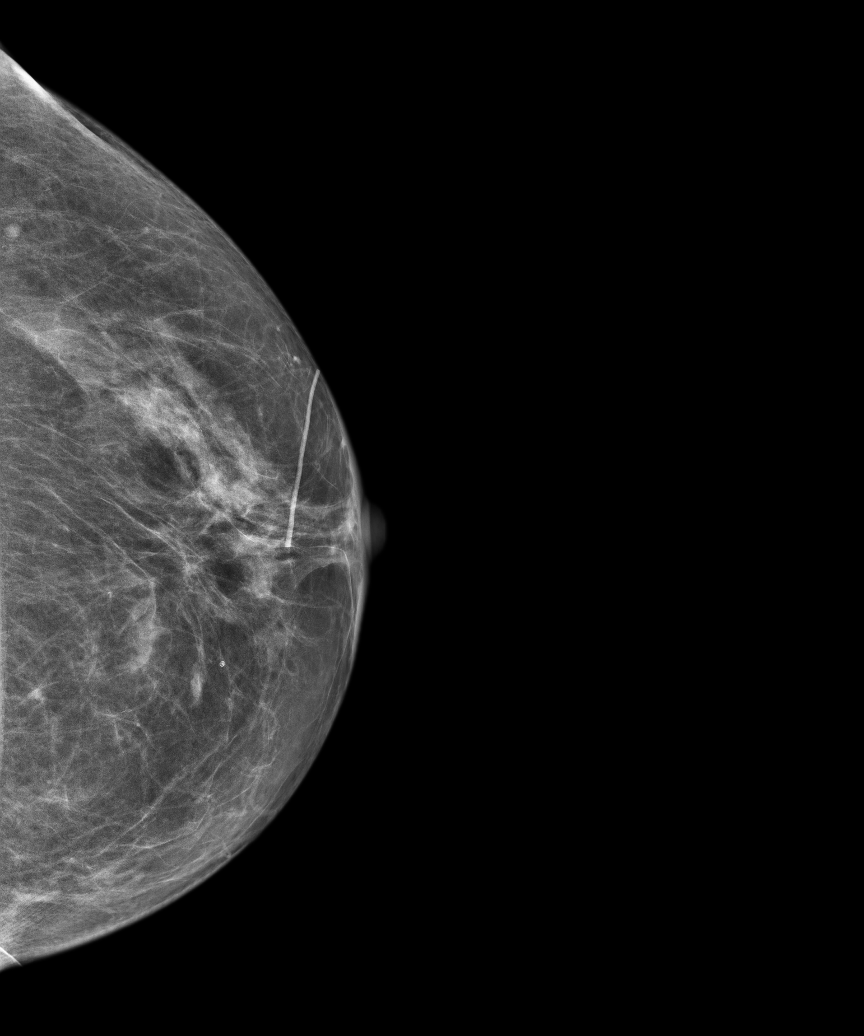
[im 3/5]
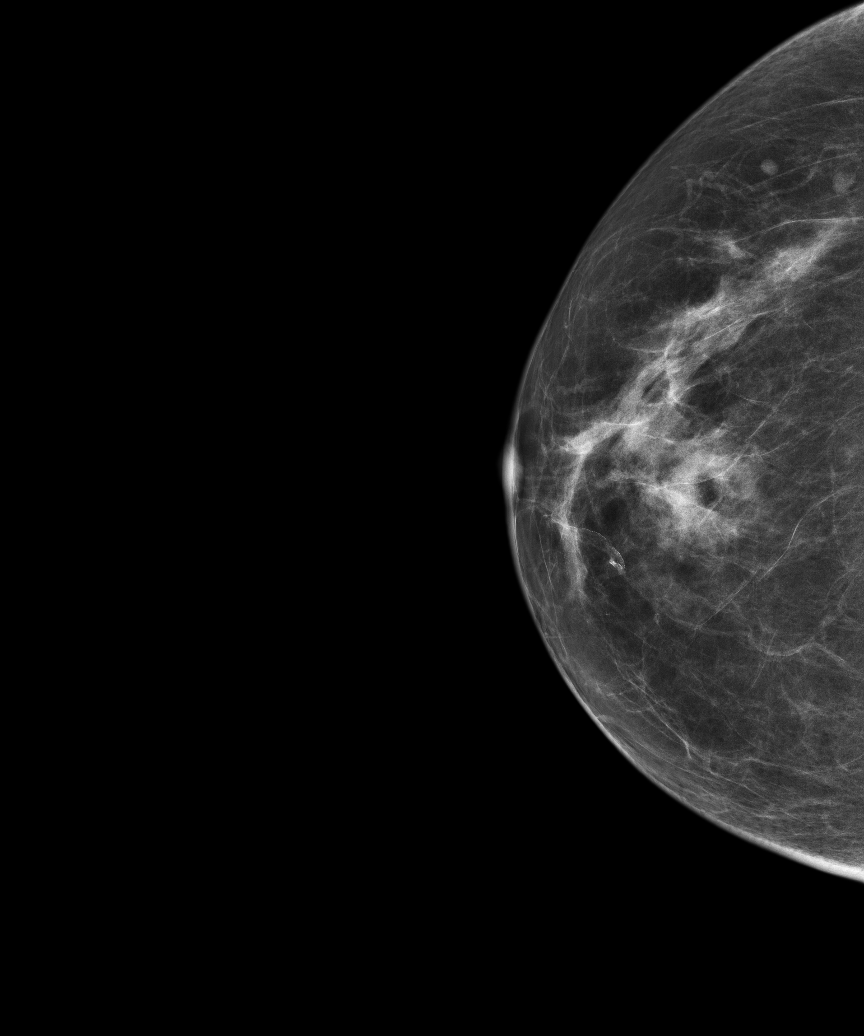
[im 4/5]
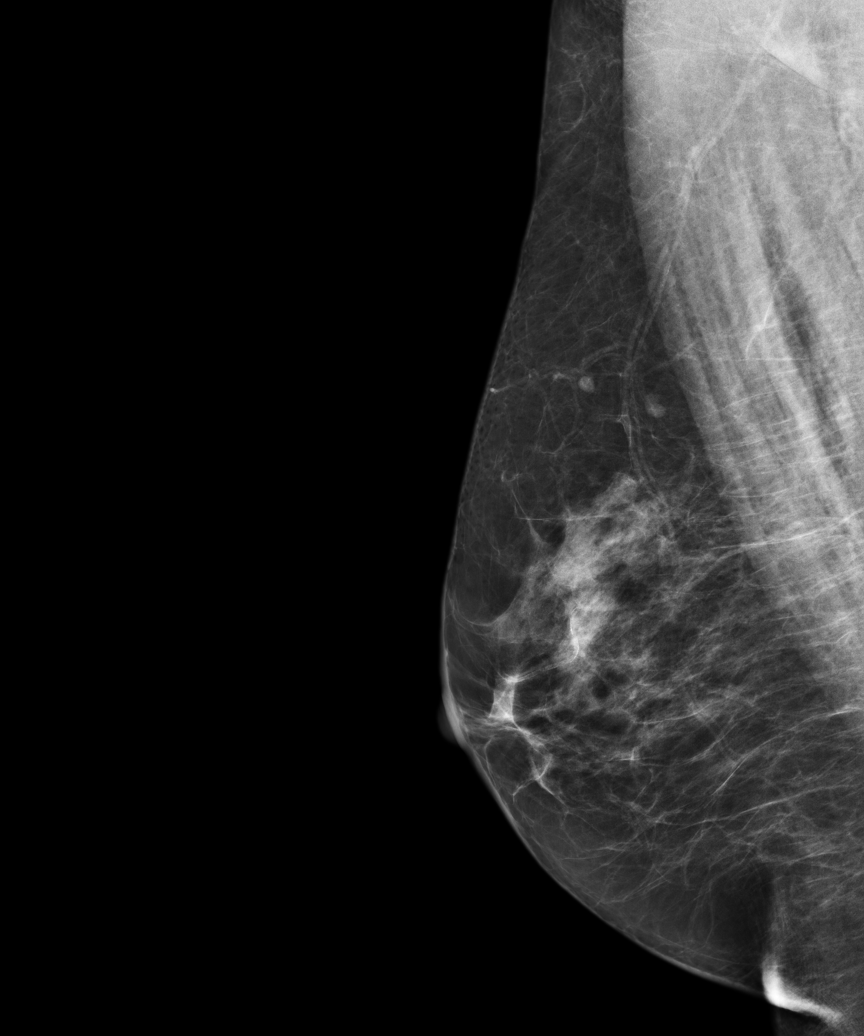
[im 5/5]
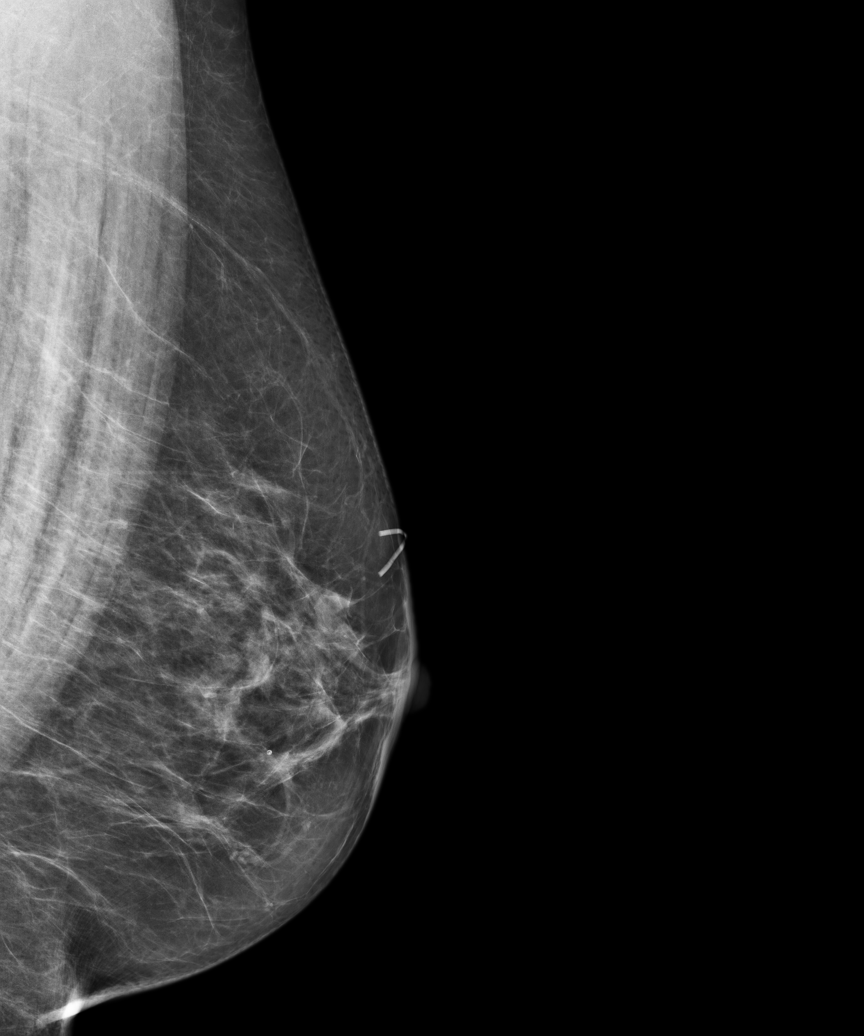

[5 of 5 positions shown; findings below may reference images not displayed]

FINDINGS: There is scattered fibroglandular tissue. No suspicious masses or
calcifications are identified. No areas of architectural distortion.
IMPRESSION: BI-RADS: Category 1  Negative

BREAST COMPOSITION: The breast composition is SCATTERED FIBROGLANDULAR
TISSUE (glandular tissue is 25-50%)

Recommend continued annual screening mammography.

A NEGATIVE MAMMOGRAM REPORT DOES NOT PRECLUDE BIOPSY OR OTHER EVALUATION OF
A CLINICALLY PALPABLE OR OTHERWISE SUSPICIOUS MASS OR LESION. BREAST CANCER
MAY NOT BE DETECTED BY MAMMOGRAPHY IN UP TO 10% OF CASES.

## 2012-08-08 ENCOUNTER — Encounter: Payer: Self-pay | Admitting: Family Medicine

## 2012-08-08 ENCOUNTER — Encounter: Payer: Self-pay | Admitting: *Deleted

## 2012-08-26 ENCOUNTER — Other Ambulatory Visit: Payer: MEDICARE

## 2012-09-01 ENCOUNTER — Encounter: Payer: Self-pay | Admitting: *Deleted

## 2012-09-01 ENCOUNTER — Other Ambulatory Visit (INDEPENDENT_AMBULATORY_CARE_PROVIDER_SITE_OTHER): Payer: Medicare Other

## 2012-09-01 DIAGNOSIS — E7800 Pure hypercholesterolemia, unspecified: Secondary | ICD-10-CM

## 2012-09-01 LAB — LIPID PANEL
HDL: 55.8 mg/dL (ref >39.00)
LDL Cholesterol: 86 mg/dL (ref 0–99)
Total CHOL/HDL Ratio: 3
VLDL: 22.4 mg/dL (ref 0.0–40.0)

## 2012-09-14 ENCOUNTER — Other Ambulatory Visit: Payer: Self-pay

## 2012-09-14 MED ORDER — PRAVASTATIN SODIUM 80 MG PO TABS: 40.0000 mg | ORAL_TABLET | Freq: Every day | ORAL | Status: DC

## 2012-09-14 NOTE — Telephone Encounter (Signed)
Pt left v/m requesting refill pravastatin to [facility]. Pt notified done.

## 2013-01-19 ENCOUNTER — Other Ambulatory Visit: Payer: Self-pay | Admitting: *Deleted

## 2013-01-19 MED ORDER — PRAVASTATIN SODIUM 80 MG PO TABS: 40.0000 mg | ORAL_TABLET | Freq: Every day | ORAL | Status: DC

## 2013-05-30 ENCOUNTER — Telehealth: Payer: Self-pay | Admitting: Family Medicine

## 2013-05-30 MED ORDER — LISINOPRIL 5 MG PO TABS: 5.0000 mg | ORAL_TABLET | Freq: Every day | ORAL | Status: DC

## 2013-05-30 NOTE — Telephone Encounter (Signed)
Pt has a mail order pharmacy but wants [place] to use [facility] on [address].  She wants [place] to send a 30 day prescription for Lisinopril to get her through to her January appt. [phone]

## 2013-06-27 ENCOUNTER — Other Ambulatory Visit: Payer: Self-pay | Admitting: Family Medicine

## 2013-06-27 DIAGNOSIS — E785 Hyperlipidemia, unspecified: Secondary | ICD-10-CM

## 2013-06-27 DIAGNOSIS — M8580 Other specified disorders of bone density and structure, unspecified site: Secondary | ICD-10-CM

## 2013-07-04 ENCOUNTER — Other Ambulatory Visit (INDEPENDENT_AMBULATORY_CARE_PROVIDER_SITE_OTHER): Payer: Medicare HMO

## 2013-07-04 DIAGNOSIS — E785 Hyperlipidemia, unspecified: Secondary | ICD-10-CM

## 2013-07-04 DIAGNOSIS — M899 Disorder of bone, unspecified: Secondary | ICD-10-CM

## 2013-07-04 DIAGNOSIS — R7309 Other abnormal glucose: Secondary | ICD-10-CM

## 2013-07-04 DIAGNOSIS — M8580 Other specified disorders of bone density and structure, unspecified site: Secondary | ICD-10-CM

## 2013-07-04 DIAGNOSIS — I10 Essential (primary) hypertension: Secondary | ICD-10-CM

## 2013-07-04 DIAGNOSIS — M949 Disorder of cartilage, unspecified: Secondary | ICD-10-CM

## 2013-07-04 LAB — COMPREHENSIVE METABOLIC PANEL
ALT: 13 U/L (ref 0–35)
AST: 18 U/L (ref 0–37)
Albumin: 3.7 g/dL (ref 3.5–5.2)
Alkaline Phosphatase: 73 U/L (ref 39–117)
BILIRUBIN TOTAL: 0.8 mg/dL (ref 0.3–1.2)
BUN: 18 mg/dL (ref 6–23)
CALCIUM: 8.9 mg/dL (ref 8.4–10.5)
CHLORIDE: 104 meq/L (ref 96–112)
CO2: 30 mEq/L (ref 19–32)
CREATININE: 0.7 mg/dL (ref 0.4–1.2)
GFR: 91.02 mL/min (ref >60.00)
Glucose, Bld: 91 mg/dL (ref 70–99)
Potassium: 4.3 mEq/L (ref 3.5–5.1)
Sodium: 139 mEq/L (ref 135–145)
Total Protein: 6.7 g/dL (ref 6.0–8.3)

## 2013-07-04 LAB — LIPID PANEL
Cholesterol: 236 mg/dL — ABNORMAL HIGH (ref 0–200)
HDL: 64.4 mg/dL (ref >39.00)
Total CHOL/HDL Ratio: 4
Triglycerides: 78 mg/dL (ref 0.0–149.0)
VLDL: 15.6 mg/dL (ref 0.0–40.0)

## 2013-07-04 LAB — LDL CHOLESTEROL, DIRECT: LDL DIRECT: 156 mg/dL

## 2013-07-05 LAB — VITAMIN D 25 HYDROXY (VIT D DEFICIENCY, FRACTURES): Vit D, 25-Hydroxy: 47 ng/mL (ref 30–89)

## 2013-07-10 ENCOUNTER — Encounter: Payer: Self-pay | Admitting: Family Medicine

## 2013-07-10 ENCOUNTER — Ambulatory Visit (INDEPENDENT_AMBULATORY_CARE_PROVIDER_SITE_OTHER): Payer: Medicare HMO | Admitting: Family Medicine

## 2013-07-10 VITALS — BP 112/52 | HR 62 | Temp 98.0°F | Ht 65.5 in | Wt 136.8 lb

## 2013-07-10 DIAGNOSIS — M899 Disorder of bone, unspecified: Secondary | ICD-10-CM

## 2013-07-10 DIAGNOSIS — E785 Hyperlipidemia, unspecified: Secondary | ICD-10-CM

## 2013-07-10 DIAGNOSIS — Z0000 Encounter for general adult medical examination without abnormal findings: Secondary | ICD-10-CM

## 2013-07-10 DIAGNOSIS — Z1231 Encounter for screening mammogram for malignant neoplasm of breast: Secondary | ICD-10-CM

## 2013-07-10 DIAGNOSIS — M8580 Other specified disorders of bone density and structure, unspecified site: Secondary | ICD-10-CM

## 2013-07-10 DIAGNOSIS — M949 Disorder of cartilage, unspecified: Secondary | ICD-10-CM

## 2013-07-10 DIAGNOSIS — I10 Essential (primary) hypertension: Secondary | ICD-10-CM

## 2013-07-10 DIAGNOSIS — Z1211 Encounter for screening for malignant neoplasm of colon: Secondary | ICD-10-CM

## 2013-07-10 NOTE — Patient Instructions (Addendum)
Go to the lab on the way out.  We'll contact you with your lab report. Desiree Ortega will call about your referral.  Tell her you want to go to [place].  Check with your insurance to see if they will cover the shingles shot. Stop the pravastatin and lisinopril.  Keep exercising.  If you BP is in the 130s/80s (or lower), then that is fine.  Take care.  Recheck in about 1 year, sooner if needed.

## 2013-07-10 NOTE — Progress Notes (Signed)
Pre-visit discussion using our clinic review tool. No additional management support is needed unless otherwise documented below in the visit note.  I have personally reviewed the Medicare Annual Wellness questionnaire and have noted 1. The patient's medical and social history 2. Their use of alcohol, tobacco or illicit drugs 3. Their current medications and supplements 4. The patient's functional ability including ADL's, fall risks, home safety risks and hearing or visual             impairment. 5. Diet and physical activities 6. Evidence for depression or mood disorders  The patients weight, height, BMI have been recorded in the chart and visual acuity is per eye clinic.  I have made referrals, counseling and provided education to the patient based review of the above and I have provided the pt with a written personalized care plan for preventive services.  See scanned forms. Routine anticipatory guidance given to patient. See health maintenance.  Flu shot declined.  She had a local reaction last year.   Discussed.  Shingles d/w pt.   PNA 2013  Tetanus 2009  D/w patient [id number] for colon cancer screening, including IFOB vs. colonoscopy. Risks and benefits of both were discussed and patient voiced understanding. Pt elects [id number]  Breast cancer screening mammogram pending.  Pap not indicated.  Repeat DXA pending.  Cognitive function addressed- see scanned forms- and if abnormal then additional documentation follows.  Living will d/w pt.  She'll check on this.  Husband would be designated if she were incapacitated.    Hypertension:    Using medication without problems or lightheadedness: yes Chest pain with exertion:no Edema:no Short of breath:no She would like to get off BP meds if possible.    Elevated Cholesterol: Using medications without problems: no problems but off statin due to worries about potential side effects.   Muscle aches: no Diet  compliance:yes Exercise:yes  PMH and SH reviewed  Meds, vitals, and allergies reviewed.   ROS: See HPI.  Otherwise negative.    GEN: nad, alert and oriented HEENT: mucous membranes moist NECK: supple w/o LA CV: rrr. PULM: ctab, no inc wob ABD: soft, +bs EXT: no edema SKIN: no acute rash

## 2013-07-11 ENCOUNTER — Telehealth: Payer: Self-pay | Admitting: Family Medicine

## 2013-07-11 NOTE — Assessment & Plan Note (Signed)
She is hesitant about statin use.  If she keeps BP wnl off BP med, then her risk would be relatively low.  Stay of statin for now.  She'll f/u on BP if needed.  Labs d/w pt.

## 2013-07-11 NOTE — Assessment & Plan Note (Signed)
Repeat DXA pending.

## 2013-07-11 NOTE — Assessment & Plan Note (Signed)
See scanned forms. Routine anticipatory guidance given to patient. See health maintenance.  Flu shot declined.  She had a local reaction last year.   Discussed.  Shingles d/w pt.   PNA 2013  Tetanus 2009  D/w patient NK:[id number] for colon cancer screening, including IFOB vs. colonoscopy. Risks and benefits of both were discussed and patient voiced understanding. Pt elects ALP:FXTK  Breast cancer screening mammogram pending.  Pap not indicated.  Repeat DXA pending.  Cognitive function addressed- see scanned forms- and if abnormal then additional documentation follows.  Living will d/w pt.  She'll check on this.  Husband would be designated if she were incapacitated.

## 2013-07-11 NOTE — Assessment & Plan Note (Signed)
She'll try to taper off ACE.  D/w pt.  Continue diet and exercise.

## 2013-07-11 NOTE — Telephone Encounter (Signed)
Relevant patient education assigned to patient using Emmi. ° °

## 2013-07-13 ENCOUNTER — Other Ambulatory Visit (INDEPENDENT_AMBULATORY_CARE_PROVIDER_SITE_OTHER): Payer: Medicare HMO

## 2013-07-13 DIAGNOSIS — Z1211 Encounter for screening for malignant neoplasm of colon: Secondary | ICD-10-CM

## 2013-07-14 LAB — FECAL OCCULT BLOOD, IMMUNOCHEMICAL: FECAL OCCULT BLD: POSITIVE — AB

## 2013-07-16 ENCOUNTER — Other Ambulatory Visit: Payer: Self-pay | Admitting: Family Medicine

## 2013-07-16 DIAGNOSIS — R195 Other fecal abnormalities: Secondary | ICD-10-CM

## 2013-07-18 ENCOUNTER — Encounter: Payer: Self-pay | Admitting: Internal Medicine

## 2013-08-01 ENCOUNTER — Ambulatory Visit (AMBULATORY_SURGERY_CENTER): Payer: Medicare HMO

## 2013-08-01 VITALS — Ht 66.0 in | Wt 143.6 lb

## 2013-08-01 DIAGNOSIS — R195 Other fecal abnormalities: Secondary | ICD-10-CM

## 2013-08-01 MED ORDER — MOVIPREP 100 G PO SOLR: ORAL | Status: DC

## 2013-08-15 ENCOUNTER — Ambulatory Visit (AMBULATORY_SURGERY_CENTER): Payer: Medicare HMO | Admitting: Internal Medicine

## 2013-08-15 ENCOUNTER — Encounter: Payer: Self-pay | Admitting: Internal Medicine

## 2013-08-15 VITALS — BP 138/68 | HR 58 | Temp 98.9°F | Resp 18 | Ht 66.0 in | Wt 143.0 lb

## 2013-08-15 DIAGNOSIS — R195 Other fecal abnormalities: Secondary | ICD-10-CM

## 2013-08-15 DIAGNOSIS — D126 Benign neoplasm of colon, unspecified: Secondary | ICD-10-CM

## 2013-08-15 DIAGNOSIS — Z1211 Encounter for screening for malignant neoplasm of colon: Secondary | ICD-10-CM

## 2013-08-15 HISTORY — PX: COLONOSCOPY: SHX174

## 2013-08-15 MED ORDER — SODIUM CHLORIDE 0.9 % IV SOLN: 500.0000 mL | INTRAVENOUS | Status: DC

## 2013-08-15 NOTE — Op Note (Signed)
[facility]  [address] [place], [id number]   COLONOSCOPY PROCEDURE REPORT  PATIENT: Desiree Ortega, Desiree Ortega  MR#: [id number] BIRTHDATE: 02/03/1946 , 67  yrs. old GENDER: Female ENDOSCOPIST: Jerene Bears, MD REFERRED NL:[id number] Duncan, M.D. PROCEDURE DATE:  08/15/2013 PROCEDURE:   Colonoscopy with snare polypectomy First Screening Colonoscopy - Avg.  risk and is 50 yrs.  old or older Yes.  Prior Negative Screening - Now for repeat screening. N/A  History of Adenoma - Now for follow-up colonoscopy & has been > or = to 3 yrs.  N/A  Polyps Removed Today? Yes. ASA CLASS:   Class II INDICATIONS:average risk screening and heme-positive stool. MEDICATIONS: MAC sedation, administered by CRNA and propofol (Diprivan) 200mg  IV  DESCRIPTION OF PROCEDURE:   After the risks benefits and alternatives of the procedure were thoroughly explained, informed consent was obtained.  A digital rectal exam revealed no rectal mass.   The Pentax Ultra Slim [id number]  endoscope was introduced through the anus and advanced to the cecum, which was identified by both the appendix and ileocecal valve. No adverse events experienced.   The quality of the prep was good, using MoviPrep The instrument was then slowly withdrawn as the colon was fully examined.   COLON FINDINGS: Three sessile polyps measuring 4-6 mm in size were found at the hepatic flexure, in the transverse colon, and descending colon.  Polypectomy was performed using cold snare.  All resections were complete and all polyp tissue was completely retrieved.   Moderate diverticulosis was noted in the ascending colon, descending colon, and sigmoid colon.  Retroflexed views revealed small external hemorrhoids. The time to cecum=8 minutes 16 seconds.  Withdrawal time=14 minutes 42 seconds.  The scope was withdrawn and the procedure completed. COMPLICATIONS: There were no complications.  ENDOSCOPIC IMPRESSION: 1.   Three sessile polyps  measuring 4-6 mm in size were found at the hepatic flexure, in the transverse colon, and descending colon; Polypectomy was performed using cold snare 2.   Moderate diverticulosis was noted in the ascending colon, descending colon, and sigmoid colon  RECOMMENDATIONS: 1.  Await pathology results 2.  High fiber diet 3.  Timing of repeat colonoscopy will be determined by pathology findings. 4.  You will receive a letter within 1-2 weeks with the results of your biopsy as well as final recommendations.  Please call my office if you have not received a letter after 3 weeks.   eSigned:  Jerene Bears, MD 08/15/2013 2:08 PM       cc: The Patient and Elsie Stain, MD

## 2013-08-15 NOTE — Patient Instructions (Signed)
YOU HAD AN ENDOSCOPIC PROCEDURE TODAY AT THE [facility]: Refer to the procedure report that was given to you for any specific questions about what was found during the examination.  If the procedure report does not answer your questions, please call your gastroenterologist to clarify.  If you requested that your care partner not be given the details of your procedure findings, then the procedure report has been included in a sealed envelope for you to review at your convenience later.  YOU SHOULD EXPECT: Some feelings of bloating in the abdomen. Passage of more gas than usual.  Walking can help get rid of the air that was put into your GI tract during the procedure and reduce the bloating. If you had a lower endoscopy (such as a colonoscopy or flexible sigmoidoscopy) you may notice spotting of blood in your stool or on the toilet paper. If you underwent a bowel prep for your procedure, then you may not have a normal bowel movement for a few days.  DIET: Your first meal following the procedure should be a light meal and then it is ok to progress to your normal diet.  A half-sandwich or bowl of soup is an example of a good first meal.  Heavy or fried foods are harder to digest and may make you feel nauseous or bloated.  Likewise meals heavy in dairy and vegetables can cause extra gas to form and this can also increase the bloating.  Drink plenty of fluids but you should avoid alcoholic beverages for 24 hours.  ACTIVITY: Your care partner should take you home directly after the procedure.  You should plan to take it easy, moving slowly for the rest of the day.  You can resume normal activity the day after the procedure however you should NOT DRIVE or use heavy machinery for 24 hours (because of the sedation medicines used during the test).    SYMPTOMS TO REPORT IMMEDIATELY: A gastroenterologist can be reached at any hour.  During normal business hours, 8:30 AM to 5:00 PM Monday through Friday,  call [phone].  After hours and on weekends, please call the GI answering service at [phone] who will take a message and have the physician on call contact you.   Following lower endoscopy (colonoscopy or flexible sigmoidoscopy):  Excessive amounts of blood in the stool  Significant tenderness or worsening of abdominal pains  Swelling of the abdomen that is new, acute  Fever of 100F or higher    FOLLOW UP: If any biopsies were taken you will be contacted by phone or by letter within the next 1-3 weeks.  Call your gastroenterologist if you have not heard about the biopsies in 3 weeks.  Our staff will call the home number listed on your records the next business day following your procedure to check on you and address any questions or concerns that you may have at that time regarding the information given to you following your procedure. This is a courtesy call and so if there is no answer at the home number and we have not heard from you through the emergency physician on call, we will assume that you have returned to your regular daily activities without incident.  SIGNATURES/CONFIDENTIALITY: You and/or your care partner have signed paperwork which will be entered into your electronic medical record.  These signatures attest to the fact that that the information above on your After Visit Summary has been reviewed and is understood.  Full responsibility of the confidentiality   of this discharge information lies with you and/or your care-partner.     

## 2013-08-15 NOTE — Progress Notes (Signed)
Called to room to assist during endoscopic procedure.  Patient ID and intended procedure confirmed with present staff. Received instructions for my participation in the procedure from the performing physician.  

## 2013-08-16 ENCOUNTER — Telehealth: Payer: Self-pay | Admitting: *Deleted

## 2013-08-16 NOTE — Telephone Encounter (Signed)
  Follow up Call-  Call back number 08/15/2013  Post procedure Call Back phone  # [phone]  Permission to leave phone message Yes     Patient questions:  Do you have a fever, pain , or abdominal swelling? no Pain Score  0 *  Have you tolerated food without any problems? yes  Have you been able to return to your normal activities? yes  Do you have any questions about your discharge instructions: Diet   no Medications  no Follow up visit  no  Do you have questions or concerns about your Care? no  Actions: * If pain score is 4 or above: No action needed, pain <4.

## 2013-08-22 ENCOUNTER — Ambulatory Visit: Payer: Self-pay | Admitting: Family Medicine

## 2013-08-22 ENCOUNTER — Encounter: Payer: Self-pay | Admitting: Family Medicine

## 2013-08-22 IMAGING — MG MM DIGITAL SCREENING BILAT W/ CAD
1 series · 5 of 5 positions shown · non-contrast
Comparison: Previous exam(s).

CLINICAL DATA: Screening.

EXAM:
DIGITAL SCREENING BILATERAL MAMMOGRAM WITH CAD

[R CC · right · 5 of 5 slices shown]
[im 1/5]
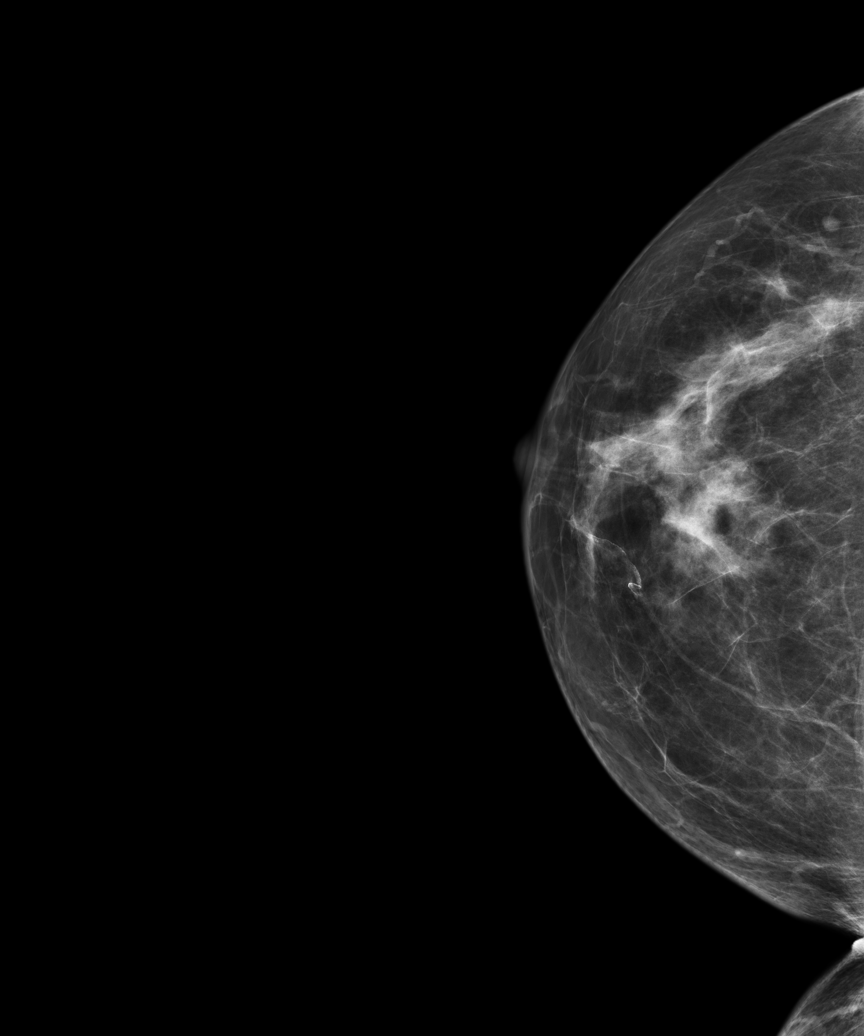
[im 2/5]
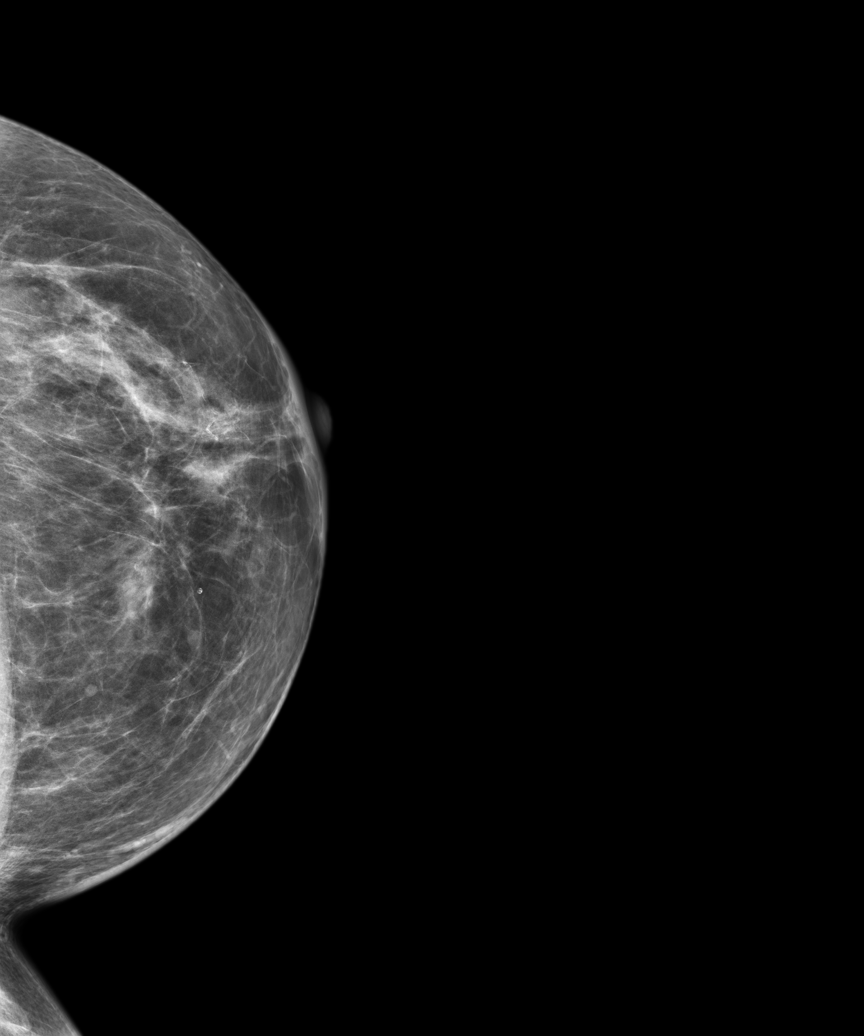
[im 3/5]
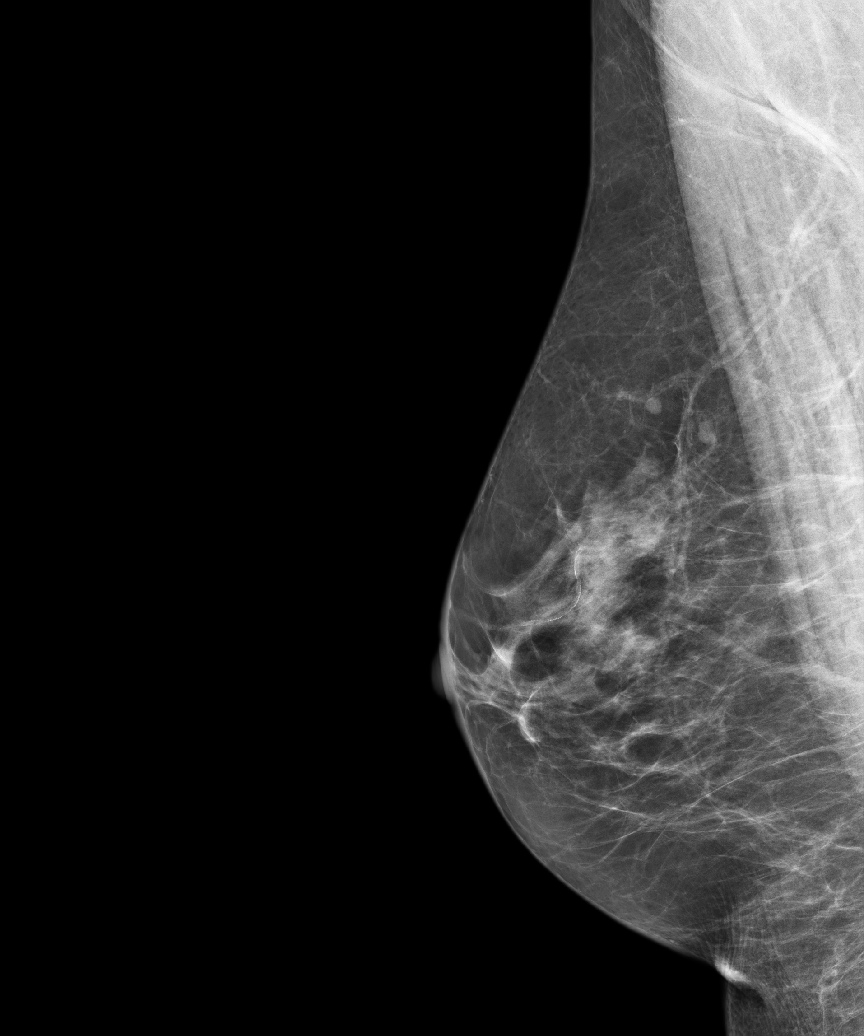
[im 4/5]
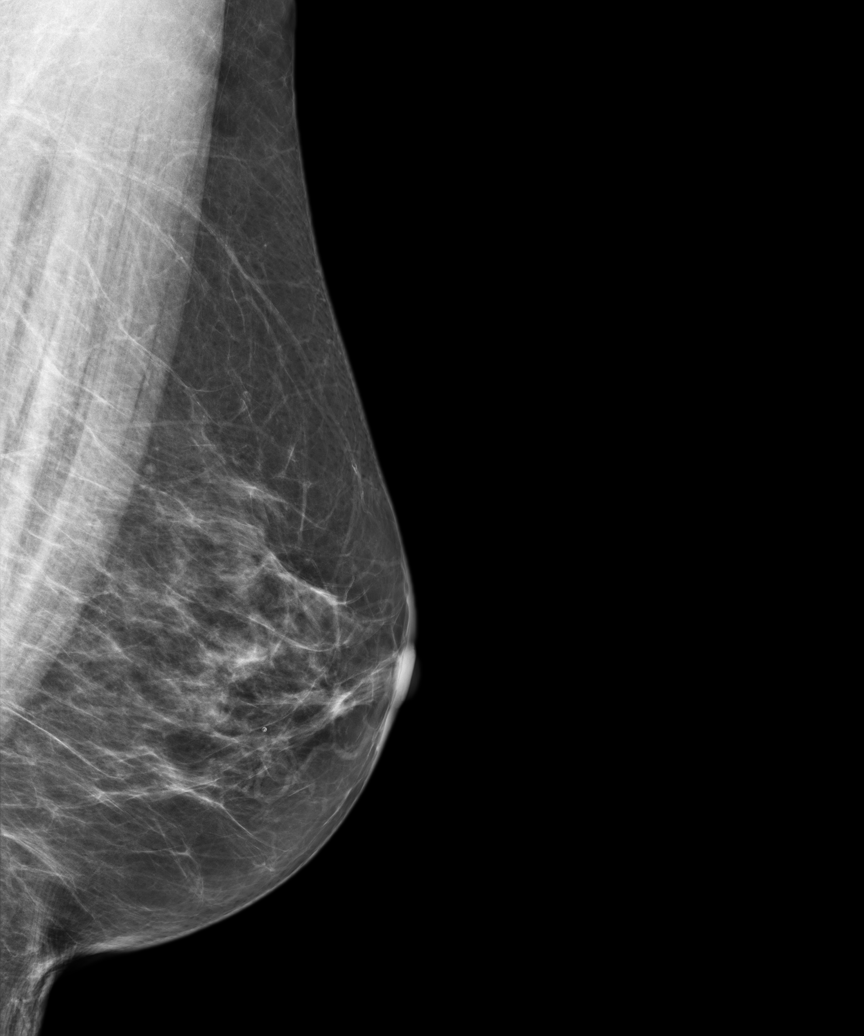
[im 5/5]
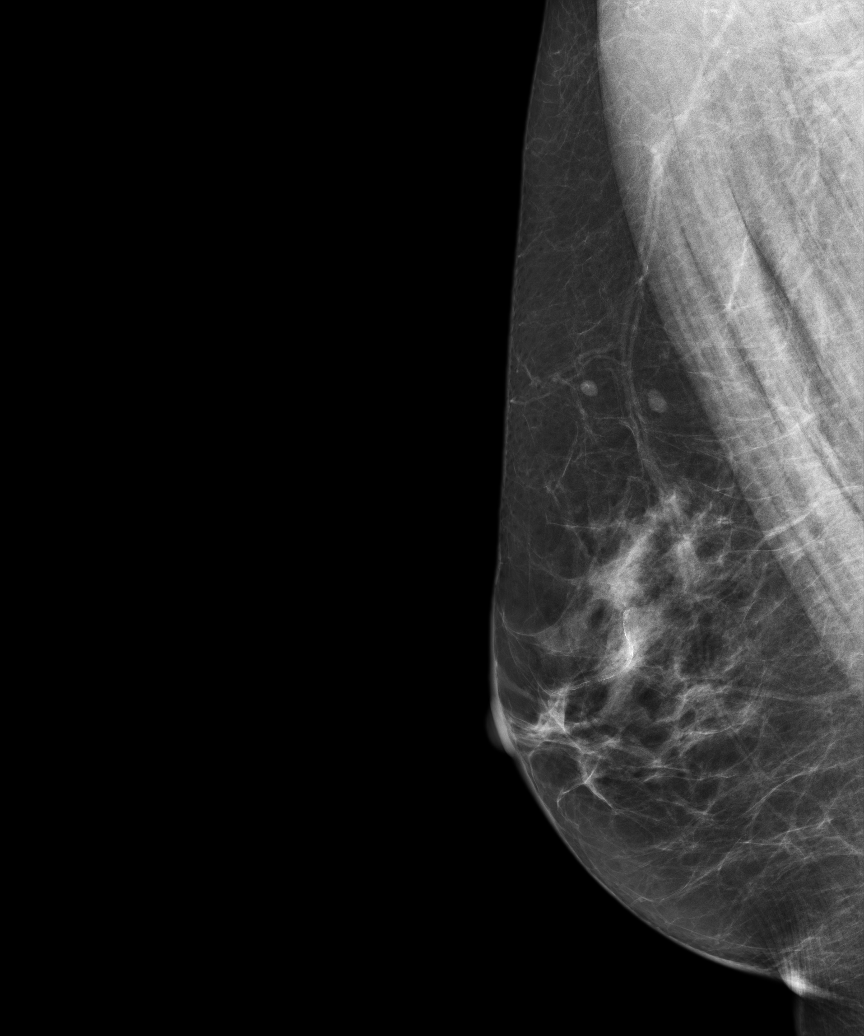

[5 of 5 positions shown; findings below may reference images not displayed]

ACR Breast Density Category b: There are scattered areas of
fibroglandular density.
FINDINGS: There are no findings suspicious for malignancy. Images were
processed with CAD.
IMPRESSION: No mammographic evidence of malignancy. A result letter of this
screening mammogram will be mailed directly to the patient.

RECOMMENDATION:
Screening mammogram in one year. (Code:[US])

BI-RADS CATEGORY  1: Negative.

## 2013-08-23 ENCOUNTER — Encounter: Payer: Self-pay | Admitting: Family Medicine

## 2013-08-24 ENCOUNTER — Encounter: Payer: Self-pay | Admitting: *Deleted

## 2013-08-24 ENCOUNTER — Encounter: Payer: Self-pay | Admitting: Internal Medicine

## 2013-08-28 ENCOUNTER — Telehealth: Payer: Self-pay | Admitting: Family Medicine

## 2013-08-28 NOTE — Telephone Encounter (Signed)
Patient advised.

## 2013-08-28 NOTE — Telephone Encounter (Signed)
Please call pt.  Her bone density score is worse than prev, not osteoporotic, but still osteopenic.  She has been treated with meds prev and her current bone density is likely underestimated by the scan, meaning her fx risk is likely lower than it would be at that same score (-2.3) if she hadn't been treated prev.  At this point, I wouldn't restart another medicine (like prolia).  I would keep going with vitamin D and calcium and recheck the dxa in about 2 years.  If she continues to lose bone mass, then we could talk about tx at that point.  Thanks.

## 2013-09-08 ENCOUNTER — Encounter: Payer: Self-pay | Admitting: Family Medicine

## 2014-03-28 ENCOUNTER — Encounter: Payer: Self-pay | Admitting: Internal Medicine

## 2014-03-28 ENCOUNTER — Ambulatory Visit (INDEPENDENT_AMBULATORY_CARE_PROVIDER_SITE_OTHER): Payer: Commercial Managed Care - HMO | Admitting: Internal Medicine

## 2014-03-28 VITALS — BP 142/66 | HR 78 | Temp 97.8°F | Wt 141.5 lb

## 2014-03-28 DIAGNOSIS — G43109 Migraine with aura, not intractable, without status migrainosus: Secondary | ICD-10-CM | POA: Insufficient documentation

## 2014-03-28 DIAGNOSIS — G43119 Migraine with aura, intractable, without status migrainosus: Secondary | ICD-10-CM

## 2014-03-28 MED ORDER — METHYLPREDNISOLONE ACETATE 80 MG/ML IJ SUSP
80.0000 mg | Freq: Once | INTRAMUSCULAR | Status: AC
  Administered 2014-03-28: 80 mg via INTRAMUSCULAR

## 2014-03-28 NOTE — Addendum Note (Signed)
Addended by: Lurlean Nanny on: 03/28/2014 05:02 PM   Modules accepted: Orders

## 2014-03-28 NOTE — Progress Notes (Signed)
Subjective:    Patient ID: Desiree Ortega, female    DOB: 12/01/1945, 68 y.o.   MRN: [id number]  HPI  Pt presents to the clinic today with c/o headache. She reports this started 3 days ago. It started in her forehead and radiates to the back of  the right side of her head. She describes the pain as dull and achy. The pain does become throbbing with activity. She has had some associated blurred vision. She has had some nausea, but denies vomiting or dizziness. She has tried aleve and tylenol without relief. She reports that she does get a headache like this every few months. She reports the headaches are preceeded by a "seran wrap crinkling sensation" in her peripheral vision. These headaches last a few hours to a few days. She has never been treated for her headaches in the past.  Review of Systems      Past Medical History  Diagnosis Date  . Hyperlipidemia   . Osteopenia     prev on fosamax for a few years then started evista at age ~89, T score improved  from -2.29/-1.96 to -1.8/-1.7 as of 06/2011  . Hypertension   . 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 Evorn Gong  . Allergy     Current Outpatient Prescriptions  Medication Sig Dispense Refill  . acetaminophen (TYLENOL) 325 MG tablet Take 650 mg by mouth every 6 (six) hours as needed.      Marland Kitchen b complex vitamins tablet Take 1 tablet by mouth daily.      . Calcium Carbonate-Vitamin D 600-400 MG-UNIT per tablet Take 1 tablet by mouth daily.       . Cinnamon 500 MG capsule Take 500 mg by mouth daily. Pt takes 3/4 tsp cinnamon daily      . Flaxseed, Linseed, (FLAX SEED OIL) 1000 MG CAPS Take 1 capsule by mouth 2 (two) times daily.       . magnesium 30 MG tablet Magnesium 133 mg twice daily.      . Misc Natural Products (OSTEO BI-FLEX JOINT SHIELD) TABS Take by mouth 2 (two) times daily as needed.      . Multiple Vitamin (MULTIVITAMIN) tablet Take 1 tablet by mouth daily.      . NON FORMULARY  Ubiquanal 100 mg-take one daily      . Omega-3 Fatty Acids (FISH OIL) 1000 MG CAPS Take by mouth daily.      . pseudoephedrine-acetaminophen (TYLENOL SINUS) 30-500 MG TABS Take 1 tablet by mouth every 4 (four) hours as needed.        . vitamin B-12 (CYANOCOBALAMIN) 500 MCG tablet Take 500 mcg by mouth 2 (two) times daily.      . vitamin C (ASCORBIC ACID) 500 MG tablet Take 500 mg by mouth 2 (two) times daily.       No current facility-administered medications for this visit.    Allergies  Allergen Reactions  . Alendronate Sodium     heartburn  . Codeine     REACTION: NAUSEA  . Influenza Vaccines     Local reaction to vaccine in 2014    Family History  Problem Relation Age of Onset  . Osteopenia Mother   . Hypertension Father   . Heart disease Father     AFIB, PVD stents LE's, pacer  . Stroke Father   . Cancer Brother     prostate, prostatectomy, radiation 5 years later, Hormone Tx  .  Prostate cancer Brother   . Heart disease Brother   . Depression Daughter   . Alcohol abuse Neg Hx   . Drug abuse Neg Hx   . Colon cancer Neg Hx   . Breast cancer Neg Hx   . Cancer Brother     prostate, prostatectomy  . Prostate cancer Brother   . Diabetes Other   . Cancer Other     Non-Hodgkin's Lymphoma    History   Social History  . Marital Status: Married    Spouse Name: N/A    Number of Children: 4  . Years of Education: N/A   Occupational History  . [employer], [employer]   Social History Main Topics  . Smoking status: Former Smoker -- 0.50 packs/day for 36 years    Types: Cigarettes    Quit date: 06/22/2001  . Smokeless tobacco: Never Used  . Alcohol Use: No     Comment: occasional  . Drug Use: No  . Sexual Activity: Not on file   Other Topics Concern  . Not on file   Social History Narrative   Married 1963, lives with husband   [employer] fan   Mother lives with patient as of 2010   4 daughters   Taught swimming prev      Constitutional: Pt reports headache. Denies fever, malaise, fatigue, or abrupt weight changes.  HEENT: Pt reports blurred vision. Denies eye pain, eye redness, ear pain, ringing in the ears, wax buildup, runny nose, nasal congestion, bloody nose, or sore throat. Gastrointestinal: Pt reports nausea. Denies abdominal pain, bloating, constipation, diarrhea or blood in the stool. .  Neurological: Denies dizziness, difficulty with memory, difficulty with speech or problems with balance and coordination.   No other specific complaints in a complete review of systems (except as listed in HPI above).  Objective:   Physical Exam   BP 142/66  Pulse 78  Temp(Src) 97.8 F (36.6 C) (Oral)  Wt 141 lb 8 oz (64.184 kg)  SpO2 98% Wt Readings from Last 3 Encounters:  03/28/14 141 lb 8 oz (64.184 kg)  08/15/13 143 lb (64.864 kg)  08/01/13 143 lb 9.6 oz (65.137 kg)    General: Appears her stated age, well developed, well nourished in NAD. HEENT: Head: normal shape and size; Eyes: sclera white, no icterus, conjunctiva pink, PERRLA and EOMs intact;  Cardiovascular: Normal rate and rhythm. S1,S2 noted.  No murmur, rubs or gallops noted.  Pulmonary/Chest: Normal effort and positive vesicular breath sounds. No respiratory distress. No wheezes, rales or ronchi noted.  Neurological: Alert and oriented.  BMET    Component Value Date/Time   NA 139 07/04/2013 0810   K 4.3 07/04/2013 0810   CL 104 07/04/2013 0810   CO2 30 07/04/2013 0810   GLUCOSE 91 07/04/2013 0810   BUN 18 07/04/2013 0810   CREATININE 0.7 07/04/2013 0810   CALCIUM 8.9 07/04/2013 0810   GFRNONAA 106.13 06/17/2010 0817   GFRAA 108 06/08/2008 0930    Lipid Panel     Component Value Date/Time   CHOL 236* 07/04/2013 0810   TRIG 78.0 07/04/2013 0810   HDL 64.40 07/04/2013 0810   CHOLHDL 4 07/04/2013 0810   VLDL 15.6 07/04/2013 0810   LDLCALC 86 09/01/2012 0803    CBC    Component Value Date/Time   WBC 7.3 06/12/2009 0749   RBC 4.67  06/12/2009 0749   HGB 14.6 06/12/2009 0749   HCT 43.3 06/12/2009 0749  PLT 202.0 06/12/2009 0749   MCV 92.7 06/12/2009 0749   MCHC 33.6 06/12/2009 0749   RDW 11.8 06/12/2009 0749   LYMPHSABS 2.8 06/12/2009 0749   MONOABS 0.6 06/12/2009 0749   EOSABS 0.1 06/12/2009 0749   BASOSABS 0.0 06/12/2009 0749    Hgb A1C No results found for this basename: HGBA1C        Assessment & Plan:   Migraine with aura:  She is ot that nauseated 80 mg Depo IM today No injection of toradol given because she drove Instructed her to go home and take 1000 mg of tylenol Drink plenty of fluids and lay down in a dark room  RTC as needed or if symptoms persist or worsen

## 2014-03-28 NOTE — Progress Notes (Signed)
Pre visit review using our clinic review tool, if applicable. No additional management support is needed unless otherwise documented below in the visit note. 

## 2014-03-28 NOTE — Patient Instructions (Addendum)

## 2014-07-02 ENCOUNTER — Other Ambulatory Visit: Payer: Self-pay | Admitting: Family Medicine

## 2014-07-02 DIAGNOSIS — E785 Hyperlipidemia, unspecified: Secondary | ICD-10-CM

## 2014-07-02 DIAGNOSIS — M8580 Other specified disorders of bone density and structure, unspecified site: Secondary | ICD-10-CM

## 2014-07-03 ENCOUNTER — Other Ambulatory Visit (INDEPENDENT_AMBULATORY_CARE_PROVIDER_SITE_OTHER): Payer: Medicare HMO

## 2014-07-03 DIAGNOSIS — E785 Hyperlipidemia, unspecified: Secondary | ICD-10-CM

## 2014-07-03 DIAGNOSIS — M8580 Other specified disorders of bone density and structure, unspecified site: Secondary | ICD-10-CM

## 2014-07-03 LAB — COMPREHENSIVE METABOLIC PANEL
ALT: 11 U/L (ref 0–35)
AST: 17 U/L (ref 0–37)
Albumin: 3.6 g/dL (ref 3.5–5.2)
Alkaline Phosphatase: 64 U/L (ref 39–117)
BUN: 21 mg/dL (ref 6–23)
CO2: 28 meq/L (ref 19–32)
CREATININE: 0.7 mg/dL (ref 0.4–1.2)
Calcium: 8.8 mg/dL (ref 8.4–10.5)
Chloride: 105 mEq/L (ref 96–112)
GFR: 89.24 mL/min (ref >60.00)
Glucose, Bld: 93 mg/dL (ref 70–99)
Potassium: 4.5 mEq/L (ref 3.5–5.1)
Sodium: 137 mEq/L (ref 135–145)
Total Bilirubin: 0.7 mg/dL (ref 0.2–1.2)
Total Protein: 6.4 g/dL (ref 6.0–8.3)

## 2014-07-03 LAB — LIPID PANEL
CHOLESTEROL: 228 mg/dL — AB (ref 0–200)
HDL: 60.4 mg/dL (ref >39.00)
LDL Cholesterol: 152 mg/dL — ABNORMAL HIGH (ref 0–99)
NonHDL: 167.6
Total CHOL/HDL Ratio: 4
Triglycerides: 80 mg/dL (ref 0.0–149.0)
VLDL: 16 mg/dL (ref 0.0–40.0)

## 2014-07-03 LAB — VITAMIN D 25 HYDROXY (VIT D DEFICIENCY, FRACTURES): VITD: 29.93 ng/mL — ABNORMAL LOW (ref 30.00–100.00)

## 2014-07-10 ENCOUNTER — Encounter: Payer: Medicare HMO | Admitting: Family Medicine

## 2014-07-12 ENCOUNTER — Ambulatory Visit (INDEPENDENT_AMBULATORY_CARE_PROVIDER_SITE_OTHER): Payer: Medicare HMO | Admitting: Family Medicine

## 2014-07-12 ENCOUNTER — Encounter: Payer: Self-pay | Admitting: Family Medicine

## 2014-07-12 VITALS — BP 138/58 | HR 61 | Temp 98.2°F | Ht 66.0 in | Wt 143.5 lb

## 2014-07-12 DIAGNOSIS — M8580 Other specified disorders of bone density and structure, unspecified site: Secondary | ICD-10-CM

## 2014-07-12 DIAGNOSIS — Z7189 Other specified counseling: Secondary | ICD-10-CM | POA: Insufficient documentation

## 2014-07-12 DIAGNOSIS — G43119 Migraine with aura, intractable, without status migrainosus: Secondary | ICD-10-CM

## 2014-07-12 DIAGNOSIS — Z0000 Encounter for general adult medical examination without abnormal findings: Secondary | ICD-10-CM

## 2014-07-12 NOTE — Assessment & Plan Note (Signed)
Prev with migraine in 03/2014, improved with IM steroid, but still took days to resolved. Prev with rare migraines, with aura.  This was a similar HA except for the duration and severity.  No sx in meantime.  D/w pt about options.  She usually improves quickly with prn tylenol, taken rarely.  Offered flexeril rx to have in case she needed it, declined by patient. She'll notify us if she has other sx.  No sx currently.

## 2014-07-12 NOTE — Patient Instructions (Addendum)
Check with your insurance to see if they will cover the shingles shot. Take care.  Glad to see you.  Recheck in about 1 year.

## 2014-07-12 NOTE — Assessment & Plan Note (Signed)
See scanned forms. Routine anticipatory guidance given to patient. See health maintenance.  Flu shot declined. She had a local reaction last year. Discussed.  Shingles d/w pt.  PNA 2013  Tetanus 2009  Colonoscopy 2015 Mammogram done 2015 Pap not indicated.  DXA done 2015, consider repeat 2017 Cognitive function addressed- see scanned forms- and if abnormal then additional documentation follows.  Living will d/w pt. She'll check on this. Husband would be designated if she were incapacitated.  Labs d/w pt.  She had wanted to stay off statin tx and this is reasonable with high HDL.  Still exercising and diet is good.

## 2014-07-12 NOTE — Progress Notes (Signed)
Pre visit review using our clinic review tool, if applicable. No additional management support is needed unless otherwise documented below in the visit note.  I have personally reviewed the Medicare Annual Wellness questionnaire and have noted 1. The patient's medical and social history 2. Their use of alcohol, tobacco or illicit drugs 3. Their current medications and supplements 4. The patient's functional ability including ADL's, fall risks, home safety risks and hearing or visual             impairment. 5. Diet and physical activities 6. Evidence for depression or mood disorders  The patients weight, height, BMI have been recorded in the chart and visual acuity is per eye clinic.  I have made referrals, counseling and provided education to the patient based review of the above and I have provided the pt with a written personalized care plan for preventive services.  Provider list updated- see scanned forms.  Routine anticipatory guidance given to patient.  See health maintenance.  See scanned forms. Routine anticipatory guidance given to patient. See health maintenance.  Flu shot declined. She had a local reaction last year. Discussed.  Shingles d/w pt.  PNA 2013  Tetanus 2009  Colonoscopy 2015 Mammogram done 2015 Pap not indicated.  DXA done 2015, consider repeat 2017 Cognitive function addressed- see scanned forms- and if abnormal then additional documentation follows.  Living will d/w pt. She'll check on this. Husband would be designated if she were incapacitated.  Labs d/w pt.  She had wanted to stay off statin tx and this is reasonable with high HDL.  Still exercising and diet is good.    Prev with migraine in 03/2014, improved with IM steroid, but still took days to resolved. Prev with rare migraines, with aura.  This was a similar HA except for the duration and severity.  No sx in meantime.  D/w pt about options.  She usually improves quickly with prn tylenol,  taken rarely.  Offered flexeril rx to have in case she needed it, declined by patient. She'll notify us if she has other sx.  No sx currently.   PMH and SH reviewed  Meds, vitals, and allergies reviewed.   ROS: See HPI.  Otherwise negative.    GEN: nad, alert and oriented HEENT: mucous membranes moist NECK: supple w/o LA CV: rrr. PULM: ctab, no inc wob ABD: soft, +bs EXT: no edema SKIN: no acute rash

## 2014-07-12 NOTE — Assessment & Plan Note (Signed)
Vit D acceptable for now, continue diet/exercise/calcium and vit D. D/w pt.

## 2014-07-12 NOTE — Assessment & Plan Note (Signed)
Living will d/w pt. Husband would be designated if she were incapacitated.

## 2015-04-23 DIAGNOSIS — X32XXXA Exposure to sunlight, initial encounter: Secondary | ICD-10-CM | POA: Diagnosis not present

## 2015-04-23 DIAGNOSIS — D225 Melanocytic nevi of trunk: Secondary | ICD-10-CM | POA: Diagnosis not present

## 2015-04-23 DIAGNOSIS — L570 Actinic keratosis: Secondary | ICD-10-CM | POA: Diagnosis not present

## 2015-04-23 DIAGNOSIS — Z85828 Personal history of other malignant neoplasm of skin: Secondary | ICD-10-CM | POA: Diagnosis not present

## 2015-04-29 ENCOUNTER — Encounter: Payer: Self-pay | Admitting: Family Medicine

## 2015-04-29 ENCOUNTER — Ambulatory Visit (INDEPENDENT_AMBULATORY_CARE_PROVIDER_SITE_OTHER): Payer: Medicare HMO | Admitting: Family Medicine

## 2015-04-29 VITALS — BP 110/64 | HR 62 | Temp 98.5°F | Wt 145.5 lb

## 2015-04-29 DIAGNOSIS — Z23 Encounter for immunization: Secondary | ICD-10-CM | POA: Diagnosis not present

## 2015-04-29 DIAGNOSIS — W540XXA Bitten by dog, initial encounter: Secondary | ICD-10-CM | POA: Diagnosis not present

## 2015-04-29 DIAGNOSIS — T148 Other injury of unspecified body region: Secondary | ICD-10-CM

## 2015-04-29 NOTE — Patient Instructions (Signed)
If you have any spreading redness or fevers or increase in pain, then update [place].  Take care.  Glad to see you.

## 2015-04-29 NOTE — Progress Notes (Signed)
Pre visit review using our clinic review tool, if applicable. No additional management support is needed unless otherwise documented below in the visit note.  Neighbor's dog bit her about 5 days ago.  Dog was prev vaccinated.  The dog has been removed from the neighborhood and is being monitored.   Was bitten near the R hip.  Was initially sore and puffy.  It wasn't bleeding much, only a few drops of blood at the time.  None since.  The incident didn't tear her clothes.  She cleaned it after the event.   The next day she started to notice bruising and an abrasion.  Bruise is fading now.   No fevers.  No draining pus.   She feels well o/w.    Meds, vitals, and allergies reviewed.   ROS: See HPI.  Otherwise, noncontributory.  nad R hip area with superficial 2x0.5cm abrasion that doesn't appear infected.  No spreading erythema.   Surrounding faint 8x6cm bruise.  Not ttp

## 2015-04-30 DIAGNOSIS — W540XXA Bitten by dog, initial encounter: Secondary | ICD-10-CM | POA: Insufficient documentation

## 2015-04-30 NOTE — Assessment & Plan Note (Signed)
tdap today.  No need for abx given the timeline and exam.   Dog was vaccinated, is being monitored.   Okay for outpatient f/u.  She'll update me as needed.

## 2015-07-07 ENCOUNTER — Other Ambulatory Visit: Payer: Self-pay | Admitting: Family Medicine

## 2015-07-07 DIAGNOSIS — E785 Hyperlipidemia, unspecified: Secondary | ICD-10-CM

## 2015-07-07 DIAGNOSIS — M8580 Other specified disorders of bone density and structure, unspecified site: Secondary | ICD-10-CM

## 2015-07-09 ENCOUNTER — Other Ambulatory Visit (INDEPENDENT_AMBULATORY_CARE_PROVIDER_SITE_OTHER): Payer: Medicare HMO

## 2015-07-09 DIAGNOSIS — M8580 Other specified disorders of bone density and structure, unspecified site: Secondary | ICD-10-CM

## 2015-07-09 DIAGNOSIS — E785 Hyperlipidemia, unspecified: Secondary | ICD-10-CM | POA: Diagnosis not present

## 2015-07-09 LAB — LIPID PANEL
CHOLESTEROL: 224 mg/dL — AB (ref 0–200)
HDL: 57.8 mg/dL (ref >39.00)
LDL CALC: 148 mg/dL — AB (ref 0–99)
NonHDL: 166.26
TRIGLYCERIDES: 92 mg/dL (ref 0.0–149.0)
Total CHOL/HDL Ratio: 4
VLDL: 18.4 mg/dL (ref 0.0–40.0)

## 2015-07-09 LAB — COMPREHENSIVE METABOLIC PANEL
ALBUMIN: 3.9 g/dL (ref 3.5–5.2)
ALT: 11 U/L (ref 0–35)
AST: 17 U/L (ref 0–37)
Alkaline Phosphatase: 71 U/L (ref 39–117)
BUN: 19 mg/dL (ref 6–23)
CALCIUM: 8.9 mg/dL (ref 8.4–10.5)
CHLORIDE: 105 meq/L (ref 96–112)
CO2: 31 meq/L (ref 19–32)
CREATININE: 0.69 mg/dL (ref 0.40–1.20)
GFR: 88.99 mL/min (ref >60.00)
Glucose, Bld: 98 mg/dL (ref 70–99)
Potassium: 4.5 mEq/L (ref 3.5–5.1)
Sodium: 141 mEq/L (ref 135–145)
Total Bilirubin: 0.7 mg/dL (ref 0.2–1.2)
Total Protein: 6.4 g/dL (ref 6.0–8.3)

## 2015-07-09 LAB — VITAMIN D 25 HYDROXY (VIT D DEFICIENCY, FRACTURES): VITD: 33.53 ng/mL (ref 30.00–100.00)

## 2015-07-15 ENCOUNTER — Encounter: Payer: Self-pay | Admitting: Family Medicine

## 2015-07-15 ENCOUNTER — Ambulatory Visit (INDEPENDENT_AMBULATORY_CARE_PROVIDER_SITE_OTHER): Payer: Medicare HMO | Admitting: Family Medicine

## 2015-07-15 VITALS — BP 126/64 | HR 65 | Temp 97.7°F | Wt 146.8 lb

## 2015-07-15 DIAGNOSIS — M8580 Other specified disorders of bone density and structure, unspecified site: Secondary | ICD-10-CM

## 2015-07-15 DIAGNOSIS — B001 Herpesviral vesicular dermatitis: Secondary | ICD-10-CM | POA: Insufficient documentation

## 2015-07-15 DIAGNOSIS — Z23 Encounter for immunization: Secondary | ICD-10-CM | POA: Diagnosis not present

## 2015-07-15 DIAGNOSIS — Z0000 Encounter for general adult medical examination without abnormal findings: Secondary | ICD-10-CM | POA: Diagnosis not present

## 2015-07-15 MED ORDER — VALACYCLOVIR HCL 1 G PO TABS: 2000.0000 mg | ORAL_TABLET | Freq: Two times a day (BID) | ORAL | Status: DC

## 2015-07-15 NOTE — Assessment & Plan Note (Signed)
Flu shot declined. She had a local reaction last year. Discussed.  Shingles d/w pt.  PNA 2013, updated 2017 Tetanus 2016 Colonoscopy 2015 Mammogram done 2015, d/w pt.   Pap not indicated.  DXA done 2015, consider repeat 2018, d/w pt.   Cognitive function addressed- see scanned forms- and if abnormal then additional documentation follows.  Living will d/w pt. She'll check on this. Husband would be designated if she were incapacitated.  Labs d/w pt. She had wanted to stay off statin tx and this is reasonable with high HDL. Lipids okay o/w.   Still exercising and diet is good.  occ cold sores, needed valtrex rx.  Done at [place].  Pt will consider HCV screening.  D/w pt re: routine screening.

## 2015-07-15 NOTE — Progress Notes (Signed)
Pre visit review using our clinic review tool, if applicable. No additional management support is needed unless otherwise documented below in the visit note.  I have personally reviewed the Medicare Annual Wellness questionnaire and have noted 1. The patient's medical and social history 2. Their use of alcohol, tobacco or illicit drugs 3. Their current medications and supplements 4. The patient's functional ability including ADL's, fall risks, home safety risks and hearing or visual             impairment. 5. Diet and physical activities 6. Evidence for depression or mood disorders  The patients weight, height, BMI have been recorded in the chart and visual acuity is per eye clinic.  I have made referrals, counseling and provided education to the patient based review of the above and I have provided the pt with a written personalized care plan for preventive services.  Provider list updated- see scanned forms.  Routine anticipatory guidance given to patient.  See health maintenance.  Flu shot declined. She had a local reaction last year. Discussed.  Shingles d/w pt.  PNA 2013, updated 2017 Tetanus 2016 Colonoscopy 2015 Mammogram done 2015, d/w pt.   Pap not indicated.  DXA done 2015, consider repeat 2018, d/w pt.   Cognitive function addressed- see scanned forms- and if abnormal then additional documentation follows.  Living will d/w pt. She'll check on this. Husband would be designated if she were incapacitated.  Labs d/w pt. She had wanted to stay off statin tx and this is reasonable with high HDL. Lipids okay o/w.   Still exercising and diet is good.  occ cold sores, needed valtrex rx.  Done at [place].  Pt will consider HCV screening.  D/w pt re: routine screening.    PMH and SH reviewed  Meds, vitals, and allergies reviewed.   ROS: See HPI.  Otherwise negative.    GEN: nad, alert and oriented HEENT: mucous membranes moist NECK: supple w/o LA CV: rrr. PULM:  ctab, no inc wob ABD: soft, +bs EXT: no edema SKIN: no acute rash

## 2015-07-15 NOTE — Patient Instructions (Addendum)
Check with your insurance to see if they will cover the shingles shot.  You can call for a mammogram at: [facility].  [place]  Glad to see you.

## 2015-07-18 ENCOUNTER — Other Ambulatory Visit: Payer: Self-pay | Admitting: Family Medicine

## 2015-07-18 DIAGNOSIS — Z1231 Encounter for screening mammogram for malignant neoplasm of breast: Secondary | ICD-10-CM

## 2015-07-25 ENCOUNTER — Other Ambulatory Visit: Payer: Self-pay | Admitting: Family Medicine

## 2015-07-25 ENCOUNTER — Ambulatory Visit
Admission: RE | Admit: 2015-07-25 | Discharge: 2015-07-25 | Disposition: A | Discharge status: Home / Self Care | Payer: Medicare HMO | Source: Ambulatory Visit | Attending: Family Medicine | Admitting: Family Medicine

## 2015-07-25 DIAGNOSIS — Z1231 Encounter for screening mammogram for malignant neoplasm of breast: Secondary | ICD-10-CM

## 2015-07-25 IMAGING — MG MM SCREENING BREAST TOMO BILATERAL
9 of 11 series · 9 of 23 positions shown · non-contrast
Comparison: Previous exam(s).

CLINICAL DATA: Screening.

EXAM:
DIGITAL SCREENING BILATERAL MAMMOGRAM WITH 3D TOMO WITH CAD

[R CC synth-2D]
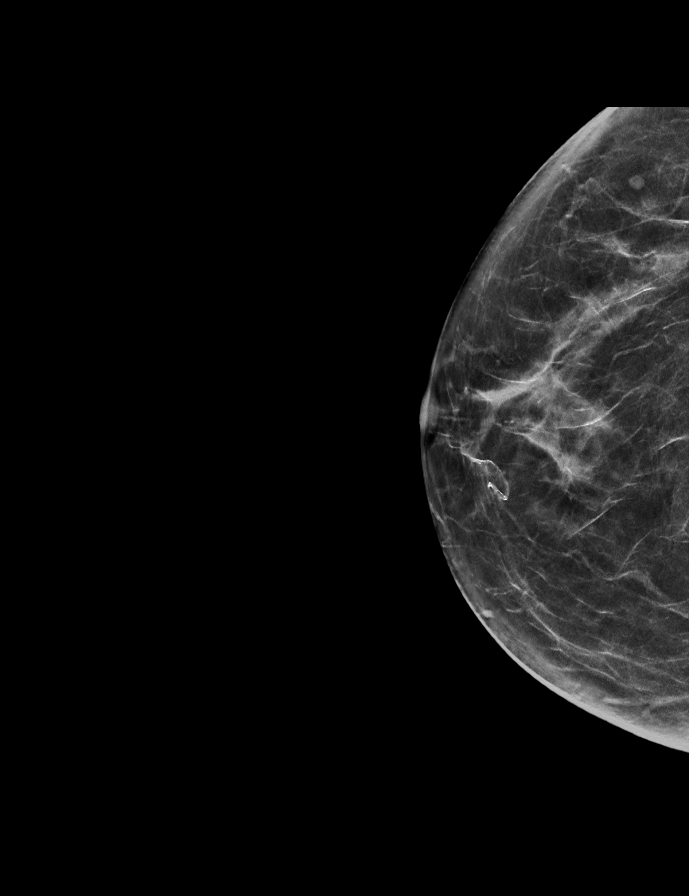

[R MLO synth-2D]
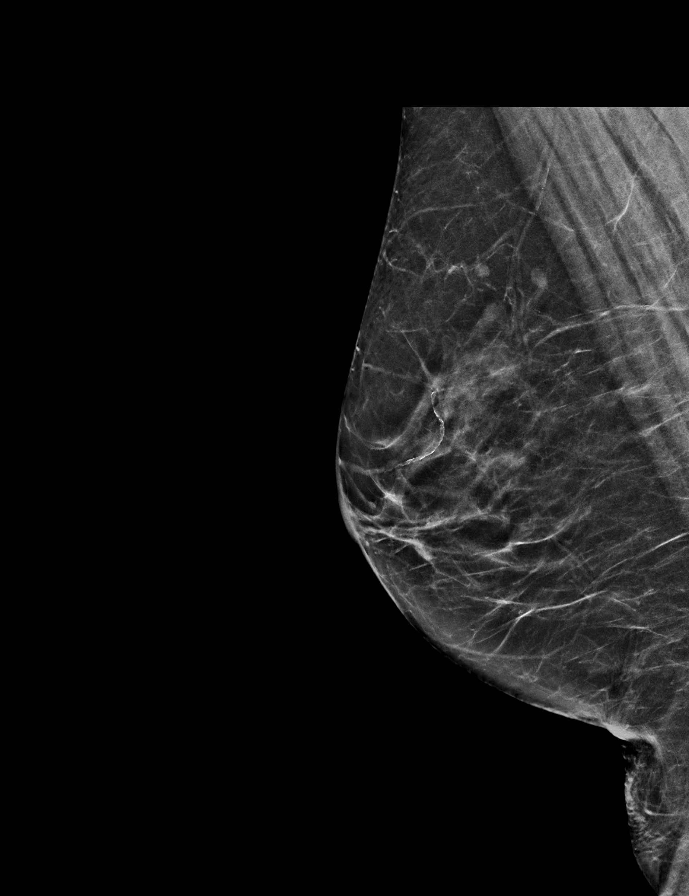

[L MLO synth-2D]
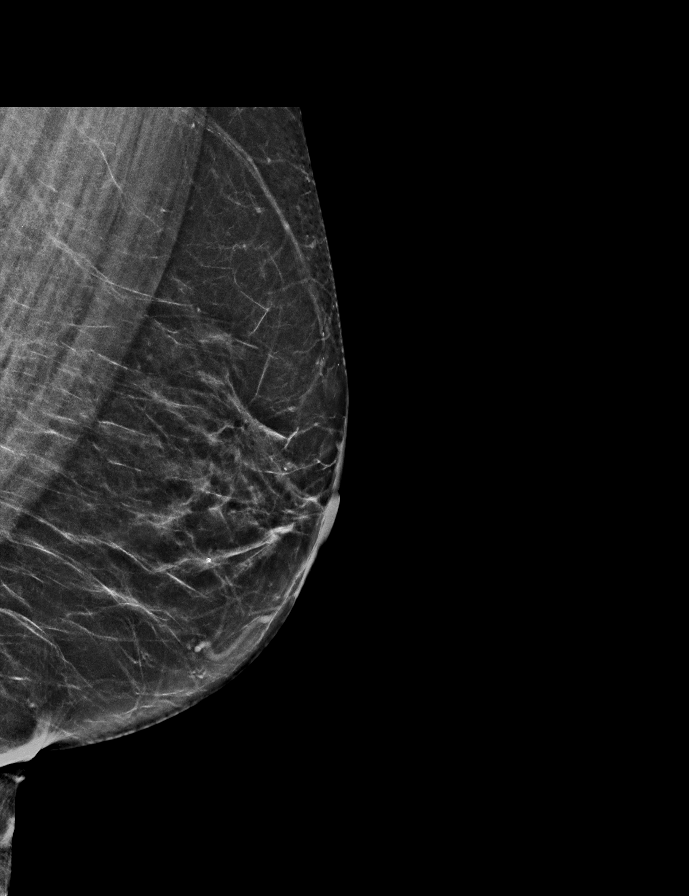

[L CC synth-2D]
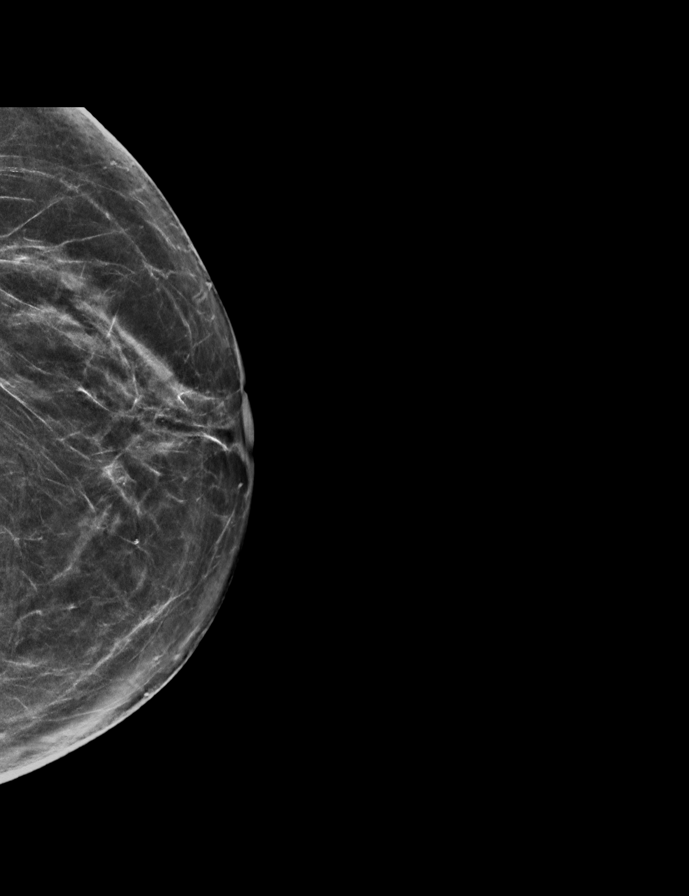

[R MLO]
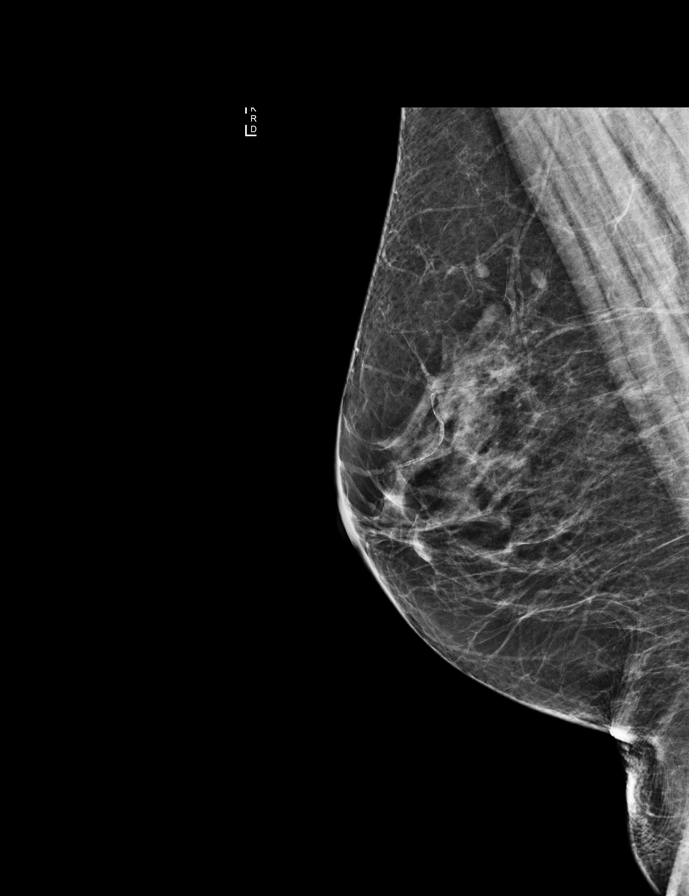

[L CC]
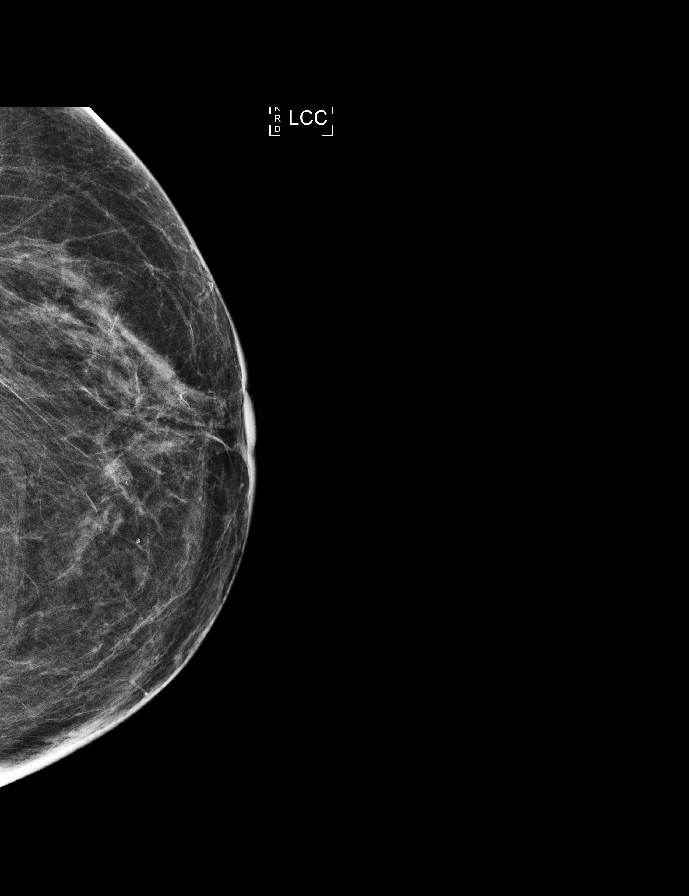

[L MLO]
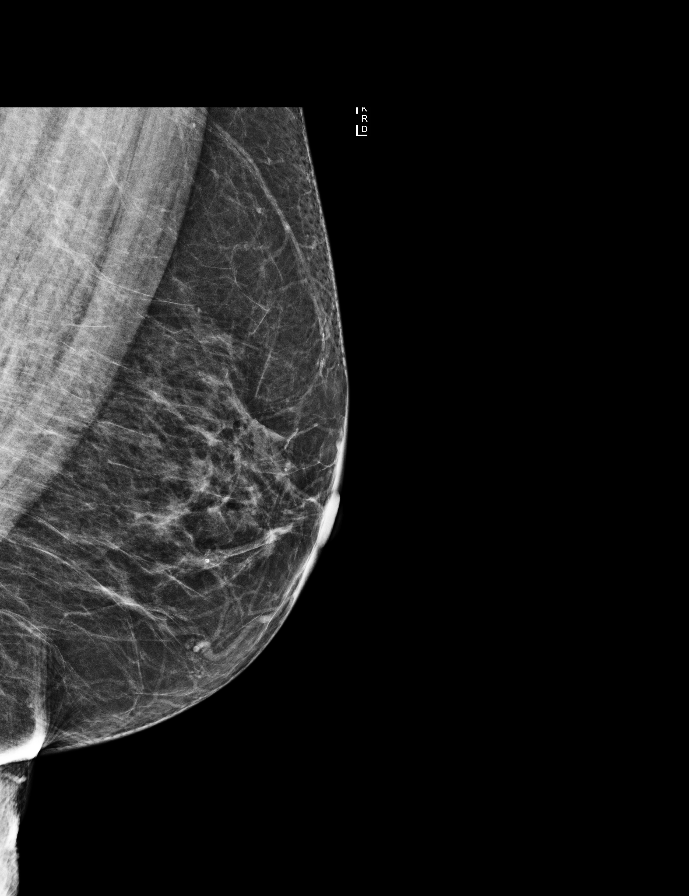

[R CC]
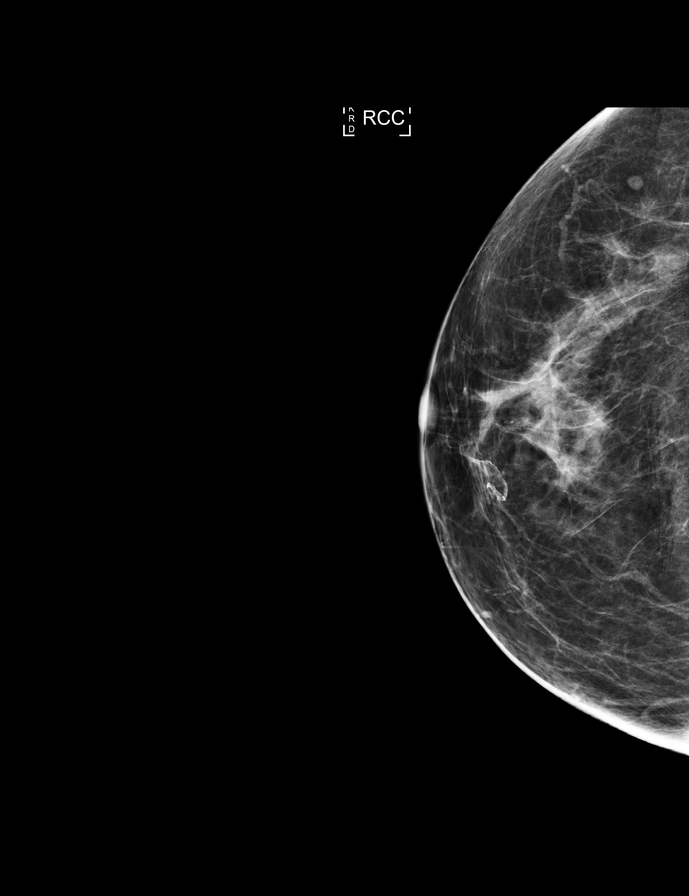

[L CC tomo · tomo slice 32/63.0]
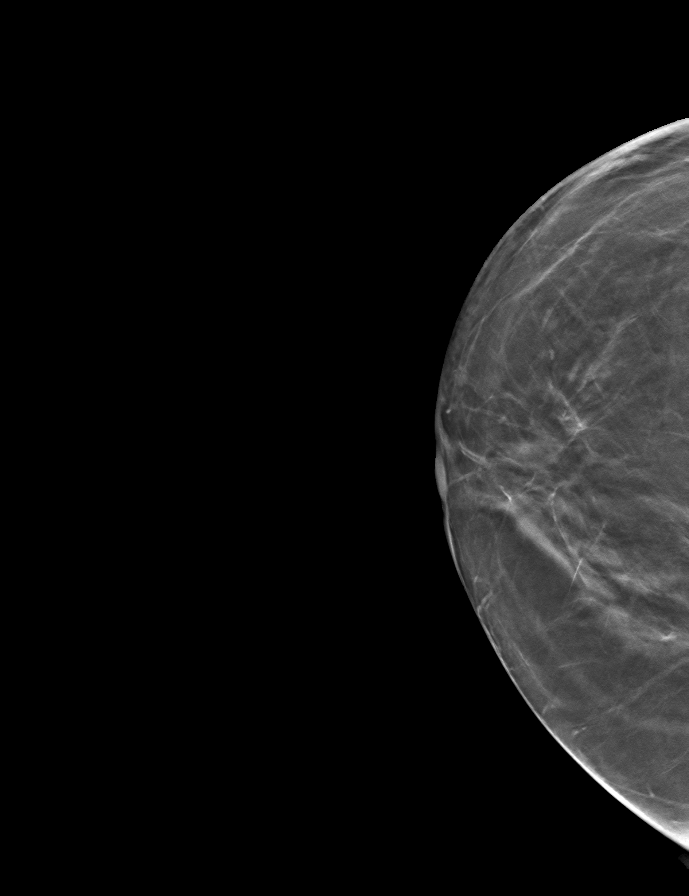

[9 of 23 positions shown; findings below may reference images not displayed]

ACR Breast Density Category b: There are scattered areas of
fibroglandular density.
FINDINGS: There are no findings suspicious for malignancy. Images were
processed with CAD.
IMPRESSION: No mammographic evidence of malignancy. A result letter of this
screening mammogram will be mailed directly to the patient.

RECOMMENDATION:
Screening mammogram in one year. (Code:[F0])

BI-RADS CATEGORY  1: Negative.

## 2015-07-26 ENCOUNTER — Encounter: Payer: Self-pay | Admitting: *Deleted

## 2015-09-23 DIAGNOSIS — R69 Illness, unspecified: Secondary | ICD-10-CM | POA: Diagnosis not present

## 2015-10-04 DIAGNOSIS — H524 Presbyopia: Secondary | ICD-10-CM | POA: Diagnosis not present

## 2016-04-06 ENCOUNTER — Telehealth: Payer: Self-pay | Admitting: Family Medicine

## 2016-04-06 NOTE — Telephone Encounter (Signed)
Opened in error

## 2016-04-28 DIAGNOSIS — Z08 Encounter for follow-up examination after completed treatment for malignant neoplasm: Secondary | ICD-10-CM | POA: Diagnosis not present

## 2016-04-28 DIAGNOSIS — Z85828 Personal history of other malignant neoplasm of skin: Secondary | ICD-10-CM | POA: Diagnosis not present

## 2016-04-28 DIAGNOSIS — D485 Neoplasm of uncertain behavior of skin: Secondary | ICD-10-CM | POA: Diagnosis not present

## 2016-04-28 DIAGNOSIS — C44729 Squamous cell carcinoma of skin of left lower limb, including hip: Secondary | ICD-10-CM | POA: Diagnosis not present

## 2016-04-28 DIAGNOSIS — L821 Other seborrheic keratosis: Secondary | ICD-10-CM | POA: Diagnosis not present

## 2016-04-28 DIAGNOSIS — D225 Melanocytic nevi of trunk: Secondary | ICD-10-CM | POA: Diagnosis not present

## 2016-07-15 DIAGNOSIS — L905 Scar conditions and fibrosis of skin: Secondary | ICD-10-CM | POA: Diagnosis not present

## 2016-07-15 DIAGNOSIS — C44729 Squamous cell carcinoma of skin of left lower limb, including hip: Secondary | ICD-10-CM | POA: Diagnosis not present

## 2016-07-15 HISTORY — PX: SQUAMOUS CELL CARCINOMA EXCISION: SHX2433

## 2016-07-16 ENCOUNTER — Other Ambulatory Visit: Payer: Self-pay | Admitting: Family Medicine

## 2016-07-16 ENCOUNTER — Encounter (INDEPENDENT_AMBULATORY_CARE_PROVIDER_SITE_OTHER): Payer: Self-pay

## 2016-07-16 ENCOUNTER — Ambulatory Visit (INDEPENDENT_AMBULATORY_CARE_PROVIDER_SITE_OTHER): Payer: Medicare HMO

## 2016-07-16 VITALS — BP 110/70 | HR 68 | Temp 97.9°F | Ht 66.0 in | Wt 143.0 lb

## 2016-07-16 DIAGNOSIS — E785 Hyperlipidemia, unspecified: Secondary | ICD-10-CM | POA: Diagnosis not present

## 2016-07-16 DIAGNOSIS — Z0000 Encounter for general adult medical examination without abnormal findings: Secondary | ICD-10-CM

## 2016-07-16 DIAGNOSIS — Z119 Encounter for screening for infectious and parasitic diseases, unspecified: Secondary | ICD-10-CM

## 2016-07-16 DIAGNOSIS — M8580 Other specified disorders of bone density and structure, unspecified site: Secondary | ICD-10-CM | POA: Diagnosis not present

## 2016-07-16 LAB — COMPREHENSIVE METABOLIC PANEL
ALT: 11 U/L (ref 0–35)
AST: 14 U/L (ref 0–37)
Albumin: 4.1 g/dL (ref 3.5–5.2)
Alkaline Phosphatase: 71 U/L (ref 39–117)
BILIRUBIN TOTAL: 0.7 mg/dL (ref 0.2–1.2)
BUN: 20 mg/dL (ref 6–23)
CHLORIDE: 106 meq/L (ref 96–112)
CO2: 31 meq/L (ref 19–32)
CREATININE: 0.73 mg/dL (ref 0.40–1.20)
Calcium: 9.6 mg/dL (ref 8.4–10.5)
GFR: 83.14 mL/min (ref >60.00)
Glucose, Bld: 103 mg/dL — ABNORMAL HIGH (ref 70–99)
Potassium: 4.2 mEq/L (ref 3.5–5.1)
SODIUM: 142 meq/L (ref 135–145)
Total Protein: 7.1 g/dL (ref 6.0–8.3)

## 2016-07-16 LAB — VITAMIN D 25 HYDROXY (VIT D DEFICIENCY, FRACTURES): VITD: 48.41 ng/mL (ref 30.00–100.00)

## 2016-07-16 LAB — LIPID PANEL
CHOL/HDL RATIO: 4
Cholesterol: 244 mg/dL — ABNORMAL HIGH (ref 0–200)
HDL: 57.1 mg/dL (ref >39.00)
LDL Cholesterol: 164 mg/dL — ABNORMAL HIGH (ref 0–99)
NONHDL: 186.75
Triglycerides: 115 mg/dL (ref 0.0–149.0)
VLDL: 23 mg/dL (ref 0.0–40.0)

## 2016-07-16 LAB — HEPATITIS C ANTIBODY: HCV AB: NEGATIVE

## 2016-07-16 NOTE — Progress Notes (Signed)
I reviewed health advisor's note, was available for consultation, and agree with documentation and plan.  

## 2016-07-16 NOTE — Patient Instructions (Signed)
Ms. Parrilla , Thank you for taking time to come for your Medicare Wellness Visit. I appreciate your ongoing commitment to your health goals. Please review the following plan we discussed and let me know if I can assist you in the future.   These are the goals we discussed: Goals    . Increase physical activity          Starting 07/16/16, I will continue to exercise at least 60 min daily.        This is a list of the screening recommended for you and due dates:  Health Maintenance  Topic Date Due  . Shingles Vaccine  07/16/2017*  . Mammogram  07/24/2017  . Colon Cancer Screening  08/15/2018  . Tetanus Vaccine  04/28/2025  . DEXA scan (bone density measurement)  Completed  .  Hepatitis C: One time screening is recommended by Center for Disease Control  (CDC) for  adults born from [birth year] through 1967.   Completed  . Pneumonia vaccines  Completed  *Topic was postponed. The date shown is not the original due date.   Preventive Care for Adults  A healthy lifestyle and preventive care can promote health and wellness. Preventive health guidelines for adults include the following key practices.  . A routine yearly physical is a good way to check with your health care provider about your health and preventive screening. It is a chance to share any concerns and updates on your health and to receive a thorough exam.  . Visit your dentist for a routine exam and preventive care every 6 months. Brush your teeth twice a day and floss once a day. Good oral hygiene prevents tooth decay and gum disease.  . The frequency of eye exams is based on your age, health, family medical history, use  of contact lenses, and other factors. Follow your health care provider's ecommendations for frequency of eye exams.  . Eat a healthy diet. Foods like vegetables, fruits, whole grains, low-fat dairy products, and lean protein foods contain the nutrients you need without too many calories. Decrease your intake of  foods high in solid fats, added sugars, and salt. Eat the right amount of calories for you. Get information about a proper diet from your health care provider, if necessary.  . Regular physical exercise is one of the most important things you can do for your health. Most adults should get at least 150 minutes of moderate-intensity exercise (any activity that increases your heart rate and causes you to sweat) each week. In addition, most adults need muscle-strengthening exercises on 2 or more days a week.  Silver Sneakers may be a benefit available to you. To determine eligibility, you may visit the website: www.silversneakers.com or contact program at (930)335-6738 Mon-Fri between 8AM-8PM.   . Maintain a healthy weight. The body mass index (BMI) is a screening tool to identify possible weight problems. It provides an estimate of body fat based on height and weight. Your health care provider can find your BMI and can help you achieve or maintain a healthy weight.   For adults 20 years and older: ? A BMI below 18.5 is considered underweight. ? A BMI of 18.5 to 24.9 is normal. ? A BMI of 25 to 29.9 is considered overweight. ? A BMI of 30 and above is considered obese.   . Maintain normal blood lipids and cholesterol levels by exercising and minimizing your intake of saturated fat. Eat a balanced diet with plenty of fruit and vegetables. Blood  tests for lipids and cholesterol should begin at age 72 and be repeated every 5 years. If your lipid or cholesterol levels are high, you are over 50, or you are at high risk for heart disease, you may need your cholesterol levels checked more frequently. Ongoing high lipid and cholesterol levels should be treated with medicines if diet and exercise are not working.  . If you smoke, find out from your health care provider how to quit. If you do not use tobacco, please do not start.  . If you choose to drink alcohol, please do not consume more than 2 drinks per  day. One drink is considered to be 12 ounces (355 mL) of beer, 5 ounces (148 mL) of wine, or 1.5 ounces (44 mL) of liquor.  . If you are 57-23 years old, ask your health care provider if you should take aspirin to prevent strokes.  . Use sunscreen. Apply sunscreen liberally and repeatedly throughout the day. You should seek shade when your shadow is shorter than you. Protect yourself by wearing long sleeves, pants, a wide-brimmed hat, and sunglasses year round, whenever you are outdoors.  . Once a month, do a whole body skin exam, using a mirror to look at the skin on your back. Tell your health care provider of new moles, moles that have irregular borders, moles that are larger than a pencil eraser, or moles that have changed in shape or color.

## 2016-07-16 NOTE — Progress Notes (Signed)
Pre visit review using our clinic review tool, if applicable. No additional management support is needed unless otherwise documented below in the visit note. 

## 2016-07-16 NOTE — Progress Notes (Signed)
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 Bandage intact.   Nurse concerns:  None  Next PCP appt:   07/21/16 @ 0845

## 2016-07-16 NOTE — Progress Notes (Signed)
Subjective:   Desiree Ortega is a 71 y.o. female who presents for Medicare Annual (Subsequent) preventive examination.  Review of Systems:  N/A Cardiac Risk Factors include: dyslipidemia;advanced age (>57mn, >>50women)     Objective:     Vitals: BP 110/70 (BP Location: Right Arm, Patient Position: Sitting, Cuff Size: Normal)   Pulse 68   Temp 97.9 F (36.6 C) (Oral)   Ht '5\' 6"'$  (1.676 m)   Wt 143 lb (64.9 kg)   SpO2 95%   BMI 23.08 kg/m   Body mass index is 23.08 kg/m.   Tobacco History  Smoking Status  . Former Smoker  . Packs/day: 0.50  . Years: 36.00  . Types: Cigarettes  . Quit date: 06/22/2001  Smokeless Tobacco  . Never Used     Counseling given: No   Past Medical History:  Diagnosis Date  . Allergy   . 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 DEvorn Gong . H/O cold sores   . Hyperlipidemia   . Hypertension   . Migraine with aura   . Osteopenia    prev on fosamax for a few years then started evista at age ~~50 T score improved  from -2.29/-1.96 to -1.8/-1.7 as of 06/2011, repeat DXA done 2015   Past Surgical History:  Procedure Laterality Date  . BREAST BIOPSY Left   . cyst on back     2 times  . cyst on eyelid     right eye  . SQUAMOUS CELL CARCINOMA EXCISION     on back/removed 2 times/and thigh  . SQUAMOUS CELL CARCINOMA EXCISION Left 07/15/2016   left lower calf  . SSC removed  1988   Birthmark removal  . TONSILLECTOMY  1950  . TUBAL LIGATION  1980's   Family History  Problem Relation Age of Onset  . Osteopenia Mother   . Hypertension Father   . Heart disease Father     AFIB, PVD stents LE's, pacer  . Stroke Father   . Cancer Brother     prostate, prostatectomy, radiation 5 years later, Hormone Tx  . Prostate cancer Brother   . Heart disease Brother   . Cancer Brother     prostate, prostatectomy  . Prostate cancer Brother   . Diabetes Other   . Cancer Other     Non-Hodgkin's Lymphoma    . Depression Daughter   . Alcohol abuse Neg Hx   . Drug abuse Neg Hx   . Colon cancer Neg Hx   . Breast cancer Neg Hx    History  Sexual Activity  . Sexual activity: Yes    Outpatient Encounter Prescriptions as of 07/16/2016  Medication Sig  . acetaminophen (TYLENOL) 325 MG tablet Take 650 mg by mouth every 6 (six) hours as needed.  .Marland Kitchenb complex vitamins tablet Take 1 tablet by mouth daily.  . Calcium-Magnesium-Zinc 333-133-5 MG TABS Take 1 tablet by mouth daily.   . Cholecalciferol (VITAMIN D-3 PO) Take 2,000 mg by mouth daily.   . Cinnamon 500 MG capsule Take 500 mg by mouth daily.   . fexofenadine (ALLEGRA) 180 MG tablet Take 180 mg by mouth daily as needed.   . Flaxseed, Linseed, (FLAX SEED OIL) 1000 MG CAPS Take 1 capsule by mouth 2 (two) times daily.   . Misc Natural Products (OSTEO BI-FLEX JOINT SHIELD) TABS Take by mouth 2 (two) times daily as needed.  . Multiple Vitamin (MULTIVITAMIN) tablet Take 1  tablet by mouth daily.  . NON FORMULARY Ubiquanal 100 mg-take one daily  . Omega-3 Fatty Acids (FISH OIL) 1000 MG CAPS Take 1,000 capsules by mouth 2 (two) times daily.   . pseudoephedrine-acetaminophen (TYLENOL SINUS) 30-500 MG TABS Take 1 tablet by mouth every 4 (four) hours as needed.    . valACYclovir (VALTREX) 1000 MG tablet Take 2 tablets (2,000 mg total) by mouth 2 (two) times daily. For one day for cold sore  . vitamin B-12 (CYANOCOBALAMIN) 500 MCG tablet Take 1,000 mcg by mouth daily.   . vitamin C (ASCORBIC ACID) 500 MG tablet Take 1,000 mg by mouth 2 (two) times daily.    No facility-administered encounter medications on file as of 07/16/2016.     Activities of Daily Living In your present state of health, do you have any difficulty performing the following activities: 07/16/2016  Hearing? N  Vision? N  Difficulty concentrating or making decisions? N  Walking or climbing stairs? N  Dressing or bathing? N  Doing errands, shopping? N  Preparing Food and eating ? N   Using the Toilet? N  In the past six months, have you accidently leaked urine? Y  Do you have problems with loss of bowel control? N  Managing your Medications? N  Managing your Finances? N  Housekeeping or managing your Housekeeping? N  Some recent data might be hidden    Patient Care Team: Tonia Ghent, MD as PCP - General (Family Medicine) Bryson Ha, OD as Consulting Physician (Optometry) Kennieth Francois, MD as Consulting Physician (Dermatology) Altha Harm, DDS as Consulting Physician (Dentistry)    Assessment:     Hearing Screening   '125Hz'$  '250Hz'$  '500Hz'$  '1000Hz'$  '2000Hz'$  '3000Hz'$  '4000Hz'$  '6000Hz'$  '8000Hz'$   Right ear:   40 40 40  40    Left ear:   40 40 40  40    Vision Screening Comments: Last vision exam in Spring 2017 with Dr. Kerin Ransom   Exercise Activities and Dietary recommendations Current Exercise Habits: Home exercise routine, Type of exercise: yoga;walking;Other - see comments;strength training/weights (swimming), Time (Minutes): 60, Frequency (Times/Week): 7, Weekly Exercise (Minutes/Week): 420, Intensity: Moderate, Exercise limited by: None identified  Goals    . Increase physical activity          Starting 07/16/16, I will continue to exercise at least 60 min daily.       Fall Risk Fall Risk  07/16/2016 07/15/2015 07/12/2014 07/10/2013 07/01/2012  Falls in the past year? No No No No No   Depression Screen PHQ 2/9 Scores 07/16/2016 07/15/2015 07/12/2014 07/10/2013  PHQ - 2 Score 0 0 0 0     Cognitive Function MMSE - Mini Mental State Exam 07/16/2016  Orientation to time 5  Orientation to Place 5  Registration 3  Attention/ Calculation 0  Recall 3  Language- name 2 objects 0  Language- repeat 1  Language- follow 3 step command 3  Language- read & follow direction 0  Write a sentence 0  Copy design 0  Total score 20     PLEASE NOTE: A Mini-Cog screen was completed. Maximum score is 20. A value of 0 denotes this part of Folstein MMSE was not completed or the  patient failed this part of the Mini-Cog screening.   Mini-Cog Screening Orientation to Time - Max 5 pts Orientation to Place - Max 5 pts Registration - Max 3 pts Recall - Max 3 pts Language Repeat - Max 1 pts Language Follow 3 Step Command - Max  3 pts     Immunization History  Administered Date(s) Administered  . Influenza, Seasonal, Injecte, Preservative Fre 07/01/2012  . Pneumococcal Conjugate-13 07/15/2015  . Pneumococcal Polysaccharide-23 06/25/2011  . Td 11/20/1997, 06/11/2008  . Tdap 04/29/2015   Screening Tests Health Maintenance  Topic Date Due  . ZOSTAVAX  07/16/2017 (Originally 11/29/2003)  . MAMMOGRAM  07/24/2017  . COLONOSCOPY  08/15/2018  . TETANUS/TDAP  04/28/2025  . DEXA SCAN  Completed  . Hepatitis C Screening  Completed  . PNA vac Low Risk Adult  Completed      Plan:     I have personally reviewed and addressed the Medicare Annual Wellness questionnaire and have noted the following in the patient's chart:  A. Medical and social history B. Use of alcohol, tobacco or illicit drugs  C. Current medications and supplements D. Functional ability and status E.  Nutritional status F.  Physical activity G. Advance directives H. List of other physicians I.  Hospitalizations, surgeries, and ER visits in previous 12 months J.  [place] to include hearing, vision, cognitive, depression L. Referrals and appointments - none  In addition, I have reviewed and discussed with patient certain preventive protocols, quality metrics, and best practice recommendations. A written personalized care plan for preventive services as well as general preventive health recommendations were provided to patient.  See attached scanned questionnaire for additional information.   Signed,   Lindell Noe, MHA, BS, LPN Health Coach

## 2016-07-21 ENCOUNTER — Encounter: Payer: Self-pay | Admitting: Family Medicine

## 2016-07-21 ENCOUNTER — Ambulatory Visit (INDEPENDENT_AMBULATORY_CARE_PROVIDER_SITE_OTHER): Payer: Medicare HMO | Admitting: Family Medicine

## 2016-07-21 VITALS — BP 122/60 | HR 72 | Temp 98.6°F | Wt 147.0 lb

## 2016-07-21 DIAGNOSIS — M8580 Other specified disorders of bone density and structure, unspecified site: Secondary | ICD-10-CM | POA: Diagnosis not present

## 2016-07-21 DIAGNOSIS — E785 Hyperlipidemia, unspecified: Secondary | ICD-10-CM | POA: Diagnosis not present

## 2016-07-21 DIAGNOSIS — Z659 Problem related to unspecified psychosocial circumstances: Secondary | ICD-10-CM | POA: Diagnosis not present

## 2016-07-21 DIAGNOSIS — Z0000 Encounter for general adult medical examination without abnormal findings: Secondary | ICD-10-CM

## 2016-07-21 NOTE — Assessment & Plan Note (Signed)
Still okay for outpatient f/u.  Her husband is getting more anxious and worried,  She is caring for him.  Her mother is also living with them and needs care.  She has sig social stressors. She is looking for ALF for her mother.  "I can't do both of them."  D/w pt about her situation.  Another doc at outside clinic sent in citalopram rx for her husband in the meantime.  That is address in his phone note, ie separate note in his chart.  No SI/HI.   She felt better after discussing her situation, she'll update me as needed.  >25 minutes spent in face to face time with patient, >50% spent in counselling or coordination of care.

## 2016-07-21 NOTE — Patient Instructions (Signed)
Update me as needed.  Take care.  Glad to see you.

## 2016-07-21 NOTE — Progress Notes (Signed)
Pre visit review using our clinic review tool, if applicable. No additional management support is needed unless otherwise documented below in the visit note. 

## 2016-07-21 NOTE — Assessment & Plan Note (Signed)
HLD with high HDL and mother still alive at 71 y/o.  Patient has good exercise tolerance and the only inc in LDL was likely related to social changes, ie stressors at home and diet/routine disruptions.  She didn't want to start another med and I see her point.  I think she can get LDL back down with work on diet and exercise when her social situation is improved, she agrees.

## 2016-07-21 NOTE — Progress Notes (Signed)
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 Bandage intact.   Nurse concerns: None ====================================  Other social stressor:  Her husband is getting more anxious and worried,  She is caring for him.  Her mother is also living with them and needs care.  She has sig social stressors. She is looking for ALF for her mother.  "I can't do both of them."  D/w pt about her situation.  Another doc at outside clinic sent in citalopram rx for her husband in the meantime.  That is address in his phone note, ie separate note in his chart.  No SI/HI.    H/o cold sores.  Has prn valtrex to use.    DXA.  D/w pt.  She didn't want more treatment, so recheck DXA deferred.  She has been exercising.  Labs d/w pt.   Pap not due.   Mammogram.  D/w pt about q1 or q2 screening.  She wanted q2 year.   Colonoscopy done 2015.  Living will d/w pt.  Husband designated if patient were incapacitated.   Shingles d/w pt.  She was afraid of a local reaction.  D/w pt.  Declined vaccine now.    HLD with high HDL and mother still alive at 69 y/o.  Patient has good exercise tolerance and the only inc in LDL was likely related to social changes, ie stressors at home and diet/routine disruptions.   PMH and SH reviewed  ROS: Per HPI unless specifically indicated in ROS section   Meds, vitals, and allergies reviewed.   GEN: nad, alert and oriented HEENT: mucous membranes moist NECK: supple w/o LA CV: rrr. PULM: ctab, no inc wob ABD: soft, +bs EXT: no edema Excision site on L lower leg bandaged.

## 2016-07-21 NOTE — Assessment & Plan Note (Signed)
DXA d/w pt.  She didn't want more treatment, so recheck DXA deferred.  She has been exercising.  Labs d/w pt.   Vit D wnl.  Pap not due.   Mammogram.  D/w pt about q1 or q2 screening.  She wanted q2 year.   Colonoscopy done 2015.  Living will d/w pt.  Husband designated if patient were incapacitated.   Shingles d/w pt.  She was afraid of a local reaction.  D/w pt.  Declined vaccine now.

## 2016-07-21 NOTE — Assessment & Plan Note (Signed)
DXA d/w pt.  She didn't want more treatment, so recheck DXA deferred.  She has been exercising.  Labs d/w pt.   Vit D wnl.

## 2016-08-12 ENCOUNTER — Telehealth: Payer: Self-pay

## 2016-08-12 NOTE — Telephone Encounter (Signed)
Mrs. Rung called today requesting a phone call from sybil, I called the patient and left message for her to call back when she is available

## 2016-09-25 ENCOUNTER — Telehealth: Payer: Self-pay | Admitting: Family Medicine

## 2016-09-25 NOTE — Telephone Encounter (Signed)
Pt called she stated when she was in here 07/16/16 she was ask if she wanted to do labs for hep c.  She was told this was free to her.  Now she is getting a bill from [facility] for $5 for that labs.  Please advise what she needs to do

## 2016-10-06 DIAGNOSIS — R69 Illness, unspecified: Secondary | ICD-10-CM | POA: Diagnosis not present

## 2016-10-08 NOTE — Telephone Encounter (Signed)
Patient received another bill.  Patient paid bill because she was worried about collections.  Patient is asking for her account to be credited.  Please call patient back at [phone] or [phone].

## 2016-10-09 DIAGNOSIS — H43393 Other vitreous opacities, bilateral: Secondary | ICD-10-CM | POA: Diagnosis not present

## 2016-10-09 DIAGNOSIS — H524 Presbyopia: Secondary | ICD-10-CM | POA: Diagnosis not present

## 2016-10-09 DIAGNOSIS — H2513 Age-related nuclear cataract, bilateral: Secondary | ICD-10-CM | POA: Diagnosis not present

## 2016-10-09 DIAGNOSIS — H40013 Open angle with borderline findings, low risk, bilateral: Secondary | ICD-10-CM | POA: Diagnosis not present

## 2016-10-14 DIAGNOSIS — X32XXXA Exposure to sunlight, initial encounter: Secondary | ICD-10-CM | POA: Diagnosis not present

## 2016-10-14 DIAGNOSIS — D2272 Melanocytic nevi of left lower limb, including hip: Secondary | ICD-10-CM | POA: Diagnosis not present

## 2016-10-14 DIAGNOSIS — D225 Melanocytic nevi of trunk: Secondary | ICD-10-CM | POA: Diagnosis not present

## 2016-10-14 DIAGNOSIS — D2261 Melanocytic nevi of right upper limb, including shoulder: Secondary | ICD-10-CM | POA: Diagnosis not present

## 2016-10-14 DIAGNOSIS — L570 Actinic keratosis: Secondary | ICD-10-CM | POA: Diagnosis not present

## 2016-10-14 DIAGNOSIS — Z85828 Personal history of other malignant neoplasm of skin: Secondary | ICD-10-CM | POA: Diagnosis not present

## 2016-11-17 DIAGNOSIS — H25812 Combined forms of age-related cataract, left eye: Secondary | ICD-10-CM | POA: Diagnosis not present

## 2016-11-17 DIAGNOSIS — H47233 Glaucomatous optic atrophy, bilateral: Secondary | ICD-10-CM | POA: Diagnosis not present

## 2016-11-20 HISTORY — PX: CATARACT EXTRACTION W/ INTRAOCULAR LENS IMPLANT: SHX1309

## 2016-12-09 DIAGNOSIS — H2512 Age-related nuclear cataract, left eye: Secondary | ICD-10-CM | POA: Diagnosis not present

## 2016-12-09 DIAGNOSIS — Z961 Presence of intraocular lens: Secondary | ICD-10-CM | POA: Diagnosis not present

## 2016-12-09 DIAGNOSIS — H25812 Combined forms of age-related cataract, left eye: Secondary | ICD-10-CM | POA: Diagnosis not present

## 2016-12-22 DIAGNOSIS — H2511 Age-related nuclear cataract, right eye: Secondary | ICD-10-CM | POA: Diagnosis not present

## 2016-12-22 DIAGNOSIS — Z961 Presence of intraocular lens: Secondary | ICD-10-CM | POA: Diagnosis not present

## 2016-12-22 HISTORY — PX: CATARACT EXTRACTION W/ INTRAOCULAR LENS IMPLANT: SHX1309

## 2017-04-22 DIAGNOSIS — R69 Illness, unspecified: Secondary | ICD-10-CM | POA: Diagnosis not present

## 2017-04-28 DIAGNOSIS — L570 Actinic keratosis: Secondary | ICD-10-CM | POA: Diagnosis not present

## 2017-04-28 DIAGNOSIS — Z85828 Personal history of other malignant neoplasm of skin: Secondary | ICD-10-CM | POA: Diagnosis not present

## 2017-04-28 DIAGNOSIS — D225 Melanocytic nevi of trunk: Secondary | ICD-10-CM | POA: Diagnosis not present

## 2017-04-28 DIAGNOSIS — D2272 Melanocytic nevi of left lower limb, including hip: Secondary | ICD-10-CM | POA: Diagnosis not present

## 2017-04-28 DIAGNOSIS — D2261 Melanocytic nevi of right upper limb, including shoulder: Secondary | ICD-10-CM | POA: Diagnosis not present

## 2017-04-28 DIAGNOSIS — X32XXXA Exposure to sunlight, initial encounter: Secondary | ICD-10-CM | POA: Diagnosis not present

## 2017-06-24 DIAGNOSIS — R69 Illness, unspecified: Secondary | ICD-10-CM | POA: Diagnosis not present

## 2017-08-02 ENCOUNTER — Other Ambulatory Visit: Payer: Self-pay | Admitting: Family Medicine

## 2017-08-02 DIAGNOSIS — E785 Hyperlipidemia, unspecified: Secondary | ICD-10-CM

## 2017-08-02 DIAGNOSIS — M8580 Other specified disorders of bone density and structure, unspecified site: Secondary | ICD-10-CM

## 2017-08-04 ENCOUNTER — Ambulatory Visit (INDEPENDENT_AMBULATORY_CARE_PROVIDER_SITE_OTHER): Payer: Medicare HMO

## 2017-08-04 VITALS — BP 100/64 | HR 73 | Temp 98.0°F | Ht 65.75 in | Wt 148.0 lb

## 2017-08-04 DIAGNOSIS — E785 Hyperlipidemia, unspecified: Secondary | ICD-10-CM | POA: Diagnosis not present

## 2017-08-04 DIAGNOSIS — M8580 Other specified disorders of bone density and structure, unspecified site: Secondary | ICD-10-CM

## 2017-08-04 DIAGNOSIS — Z0000 Encounter for general adult medical examination without abnormal findings: Secondary | ICD-10-CM | POA: Diagnosis not present

## 2017-08-04 LAB — LIPID PANEL
CHOL/HDL RATIO: 4
Cholesterol: 225 mg/dL — ABNORMAL HIGH (ref 0–200)
HDL: 55.6 mg/dL (ref >39.00)
LDL Cholesterol: 146 mg/dL — ABNORMAL HIGH (ref 0–99)
NONHDL: 169.35
Triglycerides: 119 mg/dL (ref 0.0–149.0)
VLDL: 23.8 mg/dL (ref 0.0–40.0)

## 2017-08-04 LAB — COMPREHENSIVE METABOLIC PANEL
ALT: 11 U/L (ref 0–35)
AST: 15 U/L (ref 0–37)
Albumin: 4 g/dL (ref 3.5–5.2)
Alkaline Phosphatase: 63 U/L (ref 39–117)
BILIRUBIN TOTAL: 0.5 mg/dL (ref 0.2–1.2)
BUN: 15 mg/dL (ref 6–23)
CO2: 30 meq/L (ref 19–32)
Calcium: 9.2 mg/dL (ref 8.4–10.5)
Chloride: 105 mEq/L (ref 96–112)
Creatinine, Ser: 0.71 mg/dL (ref 0.40–1.20)
GFR: 85.6 mL/min (ref >60.00)
GLUCOSE: 89 mg/dL (ref 70–99)
Potassium: 4.3 mEq/L (ref 3.5–5.1)
SODIUM: 139 meq/L (ref 135–145)
TOTAL PROTEIN: 6.7 g/dL (ref 6.0–8.3)

## 2017-08-04 LAB — VITAMIN D 25 HYDROXY (VIT D DEFICIENCY, FRACTURES): VITD: 60.18 ng/mL (ref 30.00–100.00)

## 2017-08-04 NOTE — Progress Notes (Signed)
PCP notes:   Health maintenance:  Mammogram - addressed; pt plans to schedule appt  Abnormal screenings:   Mini-Cog score: 19/20 MMSE - Mini Mental State Exam 08/04/2017 07/16/2016  Orientation to time 5 5  Orientation to Place 5 5  Registration 3 3  Attention/ Calculation 0 0  Recall 2 3  Recall-comments unable to recall 1 of 3 words -  Language- name 2 objects 0 0  Language- repeat 1 1  Language- follow 3 step command 3 3  Language- read & follow direction 0 0  Write a sentence 0 0  Copy design 0 0  Total score 19 20       Patient concerns:   None  Nurse concerns:  None  Next PCP appt:   08/09/17 @ 0945

## 2017-08-04 NOTE — Progress Notes (Signed)
Subjective:   Desiree Ortega is a 72 y.o. female who presents for Medicare Annual (Subsequent) preventive examination.  Review of Systems:  N/A Cardiac Risk Factors include: advanced age (>65men, >43 women);dyslipidemia     Objective:     Vitals: BP 100/64 (BP Location: Right Arm, Patient Position: Sitting, Cuff Size: Normal)   Pulse 73   Temp 98 F (36.7 C) (Oral)   Ht 5' 5.75" (1.67 m) Comment: no shoes  Wt 148 lb (67.1 kg)   SpO2 97%   BMI 24.07 kg/m   Body mass index is 24.07 kg/m.  Advanced Directives 08/04/2017 07/16/2016  Does Patient Have a Medical Advance Directive? No No  Would patient like information on creating a medical advance directive? No - Patient declined -    Tobacco Social History   Tobacco Use  Smoking Status Former Smoker  . Packs/day: 0.50  . Years: 36.00  . Pack years: 18.00  . Types: Cigarettes  . Last attempt to quit: 06/22/2001  . Years since quitting: 16.1  Smokeless Tobacco Never Used     Counseling given: No   Clinical Intake:  Pre-visit preparation completed: Yes  Pain : No/denies pain Pain Score: 0-No pain     Nutritional Status: BMI of 19-24  Normal Nutritional Risks: None Diabetes: No CBG done?: No Did pt. bring in CBG monitor from home?: No  How often do you need to have someone help you when you read instructions, pamphlets, or other written materials from your doctor or pharmacy?: 1 - Never  Interpreter Needed?: No  Information entered by :: LPinson, LPN  Past Medical History:  Diagnosis Date  . Allergy   . 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 Evorn Gong  . H/O cold sores   . Hyperlipidemia   . Hypertension   . Migraine with aura   . Osteopenia    prev on fosamax for a few years then started evista at age ~68, T score improved  from -2.29/-1.96 to -1.8/-1.7 as of 06/2011, repeat DXA done 2015   Past Surgical History:  Procedure Laterality Date  . BREAST BIOPSY  Left   . CATARACT EXTRACTION W/ INTRAOCULAR LENS IMPLANT Left 11/2016  . CATARACT EXTRACTION W/ INTRAOCULAR LENS IMPLANT Right 12/22/2016  . cyst on back     2 times  . cyst on eyelid     right eye  . SQUAMOUS CELL CARCINOMA EXCISION     on back/removed 2 times/and thigh  . SQUAMOUS CELL CARCINOMA EXCISION Left 07/15/2016   left lower calf  . SSC removed  1988   Birthmark removal  . TONSILLECTOMY  1950  . TUBAL LIGATION  1980's   Family History  Problem Relation Age of Onset  . Osteopenia Mother   . Hypertension Father   . Heart disease Father        AFIB, PVD stents LE's, pacer  . Stroke Father   . Cancer Brother        prostate, prostatectomy, radiation 5 years later, Hormone Tx  . Prostate cancer Brother   . Heart disease Brother   . Cancer Brother        prostate, prostatectomy  . Prostate cancer Brother   . Diabetes Other   . Cancer Other        Non-Hodgkin's Lymphoma  . Depression Daughter   . Alcohol abuse Neg Hx   . Drug abuse Neg Hx   . Colon cancer Neg Hx   .  Breast cancer Neg Hx    Social History   Socioeconomic History  . Marital status: Married    Spouse name: None  . Number of children: 4  . Years of education: None  . Highest education level: None  Social Needs  . Financial resource strain: None  . Food insecurity - worry: None  . Food insecurity - inability: None  . Transportation needs - medical: None  . Transportation needs - non-medical: None  Occupational History  . Occupation: [employer]    Comment: Teaches swimming  Tobacco Use  . Smoking status: Former Smoker    Packs/day: 0.50    Years: 36.00    Pack years: 18.00    Types: Cigarettes    Last attempt to quit: 06/22/2001    Years since quitting: 16.1  . Smokeless tobacco: Never Used  Substance and Sexual Activity  . Alcohol use: Yes    Alcohol/week: 0.0 oz    Comment: occasional  . Drug use: No  . Sexual activity: Yes  Other Topics Concern  . None    Social History Narrative   Married 1963, lives with husband   [employer] fan   Mother lives with patient as of 2010   4 daughters   Taught swimming prev    Outpatient Encounter Medications as of 08/04/2017  Medication Sig  . acetaminophen (TYLENOL) 325 MG tablet Take 650 mg by mouth every 6 (six) hours as needed.  Marland Kitchen b complex vitamins tablet Take 1 tablet by mouth daily.  . Calcium Carb-Cholecalciferol (CALCIUM/VITAMIN D PO) Take 1 tablet by mouth daily.  . Cholecalciferol (VITAMIN D-3 PO) Take 2,000 mg by mouth daily.   Marland Kitchen CINNAMON PO Take 1,000 mg by mouth daily.  . Cyanocobalamin (B-12 PO) Take 1 capsule by mouth daily.  . fexofenadine (ALLEGRA) 180 MG tablet Take 180 mg by mouth daily as needed.   . Flaxseed, Linseed, (FLAXSEED OIL PO) Take 1,200 mg by mouth daily.  Marland Kitchen MAGNESIUM PO Take 1 tablet by mouth daily.  . Misc Natural Products (OSTEO BI-FLEX JOINT SHIELD) TABS Take by mouth 2 (two) times daily as needed.  . Multiple Vitamin (MULTIVITAMIN) tablet Take 1 tablet by mouth daily.  . Multiple Vitamins-Minerals (ZINC PO) Take 1 tablet by mouth daily.  . Omega-3 Fatty Acids (FISH OIL OMEGA-3 PO) Take 2 capsules by mouth daily.  . pseudoephedrine-acetaminophen (TYLENOL SINUS) 30-500 MG TABS Take 1 tablet by mouth every 4 (four) hours as needed.    . TURMERIC CURCUMIN PO Take by mouth.  Marland Kitchen UBIQUINOL PO Take 1 tablet by mouth daily.  . valACYclovir (VALTREX) 1000 MG tablet Take 2 tablets (2,000 mg total) by mouth 2 (two) times daily. For one day for cold sore (Patient taking differently: Take 2,000 mg by mouth as needed. For one day for cold sore)  . vitamin C (ASCORBIC ACID) 500 MG tablet Take 1,000 mg by mouth 2 (two) times daily.   . [DISCONTINUED] Cinnamon 500 MG capsule Take 500 mg by mouth daily.   . [DISCONTINUED] Flaxseed, Linseed, (FLAX SEED OIL) 1000 MG CAPS Take 1 capsule by mouth 2 (two) times daily.   . [DISCONTINUED] NON FORMULARY Ubiquanal 100 mg-take one daily  .  [DISCONTINUED] Omega-3 Fatty Acids (FISH OIL) 1000 MG CAPS Take 1,000 capsules by mouth 2 (two) times daily.   . [DISCONTINUED] vitamin B-12 (CYANOCOBALAMIN) 500 MCG tablet Take 1,000 mcg by mouth daily.   . [DISCONTINUED] Calcium-Magnesium-Zinc 191-478-2 MG TABS Take 1 tablet by mouth daily.  No facility-administered encounter medications on file as of 08/04/2017.     Activities of Daily Living In your present state of health, do you have any difficulty performing the following activities: 08/04/2017  Hearing? N  Vision? N  Difficulty concentrating or making decisions? N  Walking or climbing stairs? N  Dressing or bathing? N  Doing errands, shopping? N  Preparing Food and eating ? N  Using the Toilet? N  In the past six months, have you accidently leaked urine? N  Do you have problems with loss of bowel control? N  Managing your Medications? N  Managing your Finances? N  Housekeeping or managing your Housekeeping? N  Some recent data might be hidden    Patient Care Team: Tonia Ghent, MD as PCP - General (Family Medicine) Bryson Ha, OD as Consulting Physician (Optometry) Dasher, Rayvon Char, MD as Consulting Physician (Dermatology) Altha Harm, DDS as Consulting Physician (Dentistry)    Assessment:   This is a routine wellness examination for Kallan.   Hearing Screening   125Hz  250Hz  500Hz  1000Hz  2000Hz  3000Hz  4000Hz  6000Hz  8000Hz   Right ear:   40 40 40  40    Left ear:   40 40 40  40    Vision Screening Comments: Last vision exam in 2018 with Dr. Kerin Ransom    Exercise Activities and Dietary recommendations Current Exercise Habits: Home exercise routine, Type of exercise: yoga;calisthenics;strength training/weights, Time (Minutes): 60, Frequency (Times/Week): 5, Weekly Exercise (Minutes/Week): 300, Intensity: Moderate, Exercise limited by: None identified  Goals    . Increase physical activity     Starting 08/04/2017, I will continue to exercise for at least 60  minutes 5 days per week.        Fall Risk Fall Risk  08/04/2017 07/16/2016 07/15/2015 07/12/2014 07/10/2013  Falls in the past year? No No No No No    Depression Screen PHQ 2/9 Scores 08/04/2017 07/16/2016 07/15/2015 07/12/2014  PHQ - 2 Score 0 0 0 0  PHQ- 9 Score 0 - - -     Cognitive Function MMSE - Mini Mental State Exam 08/04/2017 07/16/2016  Orientation to time 5 5  Orientation to Place 5 5  Registration 3 3  Attention/ Calculation 0 0  Recall 2 3  Recall-comments unable to recall 1 of 3 words -  Language- name 2 objects 0 0  Language- repeat 1 1  Language- follow 3 step command 3 3  Language- read & follow direction 0 0  Write a sentence 0 0  Copy design 0 0  Total score 19 20       PLEASE NOTE: A Mini-Cog screen was completed. Maximum score is 20. A value of 0 denotes this part of Folstein MMSE was not completed or the patient failed this part of the Mini-Cog screening.   Mini-Cog Screening Orientation to Time - Max 5 pts Orientation to Place - Max 5 pts Registration - Max 3 pts Recall - Max 3 pts Language Repeat - Max 1 pts Language Follow 3 Step Command - Max 3 pts   Immunization History  Administered Date(s) Administered  . Influenza, Seasonal, Injecte, Preservative Fre 07/01/2012  . Pneumococcal Conjugate-13 07/15/2015  . Pneumococcal Polysaccharide-23 06/25/2011  . Td 11/20/1997, 06/11/2008  . Tdap 04/29/2015    Screening Tests Health Maintenance  Topic Date Due  . MAMMOGRAM  06/21/2018 (Originally 07/24/2017)  . COLONOSCOPY  08/15/2018  . TETANUS/TDAP  04/28/2025  . DEXA SCAN  Completed  . Hepatitis C Screening  Completed  .  PNA vac Low Risk Adult  Completed     Plan:     I have personally reviewed, addressed, and noted the following in the patient's chart:  A. Medical and social history B. Use of alcohol, tobacco or illicit drugs  C. Current medications and supplements D. Functional ability and status E.  Nutritional status F.  Physical  activity G. Advance directives H. List of other physicians I.  Hospitalizations, surgeries, and ER visits in previous 12 months J.  [place] to include hearing, vision, cognitive, depression L. Referrals and appointments - none  In addition, I have reviewed and discussed with patient certain preventive protocols, quality metrics, and best practice recommendations. A written personalized care plan for preventive services as well as general preventive health recommendations were provided to patient.  See attached scanned questionnaire for additional information.   Signed,   Lindell Noe, MHA, BS, LPN Health Coach

## 2017-08-04 NOTE — Progress Notes (Signed)
Pre visit review using our clinic review tool, if applicable. No additional management support is needed unless otherwise documented below in the visit note. 

## 2017-08-04 NOTE — Progress Notes (Signed)
I reviewed health advisor's note, was available for consultation, and agree with documentation and plan.  

## 2017-08-04 NOTE — Patient Instructions (Signed)
Desiree Ortega , Thank you for taking time to come for your Medicare Wellness Visit. I appreciate your ongoing commitment to your health goals. Please review the following plan we discussed and let me know if I can assist you in the future.   These are the goals we discussed: Goals    . Increase physical activity     Starting 08/04/2017, I will continue to exercise for at least 60 minutes 5 days per week.        This is a list of the screening recommended for you and due dates:  Health Maintenance  Topic Date Due  . Mammogram  06/21/2018*  . Colon Cancer Screening  08/15/2018  . Tetanus Vaccine  04/28/2025  . DEXA scan (bone density measurement)  Completed  .  Hepatitis C: One time screening is recommended by Center for Disease Control  (CDC) for  adults born from [birth year] through 1967.   Completed  . Pneumonia vaccines  Completed  *Topic was postponed. The date shown is not the original due date.   Preventive Care for Adults  A healthy lifestyle and preventive care can promote health and wellness. Preventive health guidelines for adults include the following key practices.  . A routine yearly physical is a good way to check with your health care provider about your health and preventive screening. It is a chance to share any concerns and updates on your health and to receive a thorough exam.  . Visit your dentist for a routine exam and preventive care every 6 months. Brush your teeth twice a day and floss once a day. Good oral hygiene prevents tooth decay and gum disease.  . The frequency of eye exams is based on your age, health, family medical history, use  of contact lenses, and other factors. Follow your health care provider's recommendations for frequency of eye exams.  . Eat a healthy diet. Foods like vegetables, fruits, whole grains, low-fat dairy products, and lean protein foods contain the nutrients you need without too many calories. Decrease your intake of foods high in solid  fats, added sugars, and salt. Eat the right amount of calories for you. Get information about a proper diet from your health care provider, if necessary.  . Regular physical exercise is one of the most important things you can do for your health. Most adults should get at least 150 minutes of moderate-intensity exercise (any activity that increases your heart rate and causes you to sweat) each week. In addition, most adults need muscle-strengthening exercises on 2 or more days a week.  Silver Sneakers may be a benefit available to you. To determine eligibility, you may visit the website: www.silversneakers.com or contact program at 2507978268 Mon-Fri between 8AM-8PM.   . Maintain a healthy weight. The body mass index (BMI) is a screening tool to identify possible weight problems. It provides an estimate of body fat based on height and weight. Your health care provider can find your BMI and can help you achieve or maintain a healthy weight.   For adults 20 years and older: ? A BMI below 18.5 is considered underweight. ? A BMI of 18.5 to 24.9 is normal. ? A BMI of 25 to 29.9 is considered overweight. ? A BMI of 30 and above is considered obese.   . Maintain normal blood lipids and cholesterol levels by exercising and minimizing your intake of saturated fat. Eat a balanced diet with plenty of fruit and vegetables. Blood tests for lipids and cholesterol should begin  at age 21 and be repeated every 5 years. If your lipid or cholesterol levels are high, you are over 50, or you are at high risk for heart disease, you may need your cholesterol levels checked more frequently. Ongoing high lipid and cholesterol levels should be treated with medicines if diet and exercise are not working.  . If you smoke, find out from your health care provider how to quit. If you do not use tobacco, please do not start.  . If you choose to drink alcohol, please do not consume more than 2 drinks per day. One drink is  considered to be 12 ounces (355 mL) of beer, 5 ounces (148 mL) of wine, or 1.5 ounces (44 mL) of liquor.  . If you are 78-55 years old, ask your health care provider if you should take aspirin to prevent strokes.  . Use sunscreen. Apply sunscreen liberally and repeatedly throughout the day. You should seek shade when your shadow is shorter than you. Protect yourself by wearing long sleeves, pants, a wide-brimmed hat, and sunglasses year round, whenever you are outdoors.  . Once a month, do a whole body skin exam, using a mirror to look at the skin on your back. Tell your health care provider of new moles, moles that have irregular borders, moles that are larger than a pencil eraser, or moles that have changed in shape or color.

## 2017-08-09 ENCOUNTER — Ambulatory Visit (INDEPENDENT_AMBULATORY_CARE_PROVIDER_SITE_OTHER): Payer: Medicare HMO | Admitting: Family Medicine

## 2017-08-09 ENCOUNTER — Encounter: Payer: Self-pay | Admitting: Family Medicine

## 2017-08-09 VITALS — BP 100/64 | HR 73 | Temp 98.0°F | Ht 65.75 in | Wt 148.0 lb

## 2017-08-09 DIAGNOSIS — Z659 Problem related to unspecified psychosocial circumstances: Secondary | ICD-10-CM

## 2017-08-09 DIAGNOSIS — M8580 Other specified disorders of bone density and structure, unspecified site: Secondary | ICD-10-CM | POA: Diagnosis not present

## 2017-08-09 DIAGNOSIS — G43119 Migraine with aura, intractable, without status migrainosus: Secondary | ICD-10-CM | POA: Diagnosis not present

## 2017-08-09 DIAGNOSIS — E785 Hyperlipidemia, unspecified: Secondary | ICD-10-CM

## 2017-08-09 DIAGNOSIS — B001 Herpesviral vesicular dermatitis: Secondary | ICD-10-CM

## 2017-08-09 DIAGNOSIS — Z7189 Other specified counseling: Secondary | ICD-10-CM

## 2017-08-09 DIAGNOSIS — Z0000 Encounter for general adult medical examination without abnormal findings: Secondary | ICD-10-CM | POA: Insufficient documentation

## 2017-08-09 NOTE — Assessment & Plan Note (Signed)
Mammogram pt plans to schedule appt.   DXA- osteopenia discussion.  She had flu reaction prev.  D/w pt.  Reasonable to defer.   Other vaccines d/w pt.  shingrix out of stock, d/w pt.  Colonoscopy not due.  Pap not due.   Labs d/w pt.  Living will d/w pt. Husband would be designated if she were incapacitated.

## 2017-08-09 NOTE — Assessment & Plan Note (Signed)
Doing well, rare use of valtrex. Continue prn use

## 2017-08-09 NOTE — Assessment & Plan Note (Addendum)
She has been treated with evista for many years in the past and her fx risk is likely lower than it would be at that same score (-2.3) if she hadn't been treated prev.  d/w pt about options.  She didn't want further treatment now so it would be reasonable to defer the test.  We can talk about it in the future.  She agrees.  >25 minutes spent in face to face time with patient, >50% spent in counselling or coordination of care discussing lipids, osteopenia, labs, etc. .  Vit D wnl.

## 2017-08-09 NOTE — Assessment & Plan Note (Signed)
Her ASCVD is 8% and that may overestimate her risk given her FH.  She didn't want treatment anyway, so reasonable to defer, d/w pt about all options in detail.  She agrees with plan.

## 2017-08-09 NOTE — Assessment & Plan Note (Signed)
H/o but clearly better in the meantime.  She has aura but with early tylenol use the HA will not develop.  Rare sx, she'll update me as needed.

## 2017-08-09 NOTE — Patient Instructions (Signed)
Thank you for your effort.  Update me as needed.  We can consider follow up bone density testing later on.  Take care.  Glad to see you.

## 2017-08-09 NOTE — Assessment & Plan Note (Signed)
Living will d/w pt. Husband would be designated if she were incapacitated.

## 2017-08-09 NOTE — Assessment & Plan Note (Signed)
She is helping care for her husband who has had a lot of fatigue, he has h/o A fib and CVA; he is going to f/u with the A fib clinic.  She also had to place her 74 year old mother in ALF.  This was difficult for the patient but needed given the circumstance with her husband's illness. D/w pt.  Exercise helps with stress management.  She is getting by and "taking care of myself."  I thanked her for her effort to care for herself and her family.

## 2017-08-09 NOTE — Progress Notes (Signed)
She is helping care for her husband who has had a lot of fatigue, he has h/o A fib and CVA; he is going to f/u with the A fib clinic.  She also had to place her 74 year old mother in ALF.  This was difficult for the patient but needed given the circumstance with her husband's illness. D/w pt.  Exercise helps with stress management.  She is getting by and "taking care of myself."    Mammogram pt plans to schedule appt.   DXA- see below.   She had flu reaction prev.  D/w pt.  Reasonable to defer.   Other vaccines d/w pt.  shingrix out of stock, d/w pt.  Colonoscopy not due.  Pap not due.   Labs d/w pt.  Living will d/w pt. Husband would be designated if she were incapacitated.   D/w pt about prev DXA.  Her bone density score is worse than prev, not osteoporotic, but still osteopenic on last check.  She has been treated with evista for many years in the past and her fx risk is likely lower than it would be at that same score (-2.3) if she hadn't been treated prev.  d/w pt about options.  She didn't want further treatment now so it would be reasonable to defer the test.  We can talk about it in the future.  She agrees.    Migraine.  No recent sx.  She is doing well.  She has occ aura with visual changes but no HA follows if she takes tylenol.   Those episodes are rare.    Her lipids were better.  She cut out fried foods and she is exercising.  She doesn't have HTN.  Her mother is 64 and doesn't have heart disease.  She likely wouldn't need a statin given her levels, situation and FH.  Her ASCVD is 8% and that may overestimate her risk given her FH.  She didn't want treatment anyway.    No recent valtrex use/need.  D/w pt.    PMH and SH reviewed  ROS: Per HPI unless specifically indicated in ROS section   Meds, vitals, and allergies reviewed.   GEN: nad, alert and oriented HEENT: mucous membranes moist NECK: supple w/o LA CV: rrr.  no murmur PULM: ctab, no inc wob ABD: soft, +bs EXT: no  edema SKIN: no acute rash

## 2017-08-24 ENCOUNTER — Other Ambulatory Visit: Payer: Self-pay | Admitting: Family Medicine

## 2017-08-24 DIAGNOSIS — Z1231 Encounter for screening mammogram for malignant neoplasm of breast: Secondary | ICD-10-CM

## 2017-08-31 ENCOUNTER — Ambulatory Visit
Admission: RE | Admit: 2017-08-31 | Discharge: 2017-08-31 | Disposition: A | Discharge status: Home / Self Care | Payer: Medicare HMO | Source: Ambulatory Visit | Attending: Family Medicine | Admitting: Family Medicine

## 2017-08-31 DIAGNOSIS — Z1231 Encounter for screening mammogram for malignant neoplasm of breast: Secondary | ICD-10-CM | POA: Diagnosis not present

## 2017-08-31 IMAGING — MG MM DIGITAL SCREENING BILAT W/ TOMO W/ CAD
9 of 13 series · 9 of 29 positions shown · non-contrast
Comparison: Previous exam(s).

CLINICAL DATA: Screening.

EXAM:
DIGITAL SCREENING BILATERAL MAMMOGRAM WITH TOMO AND CAD

[R XCCL]
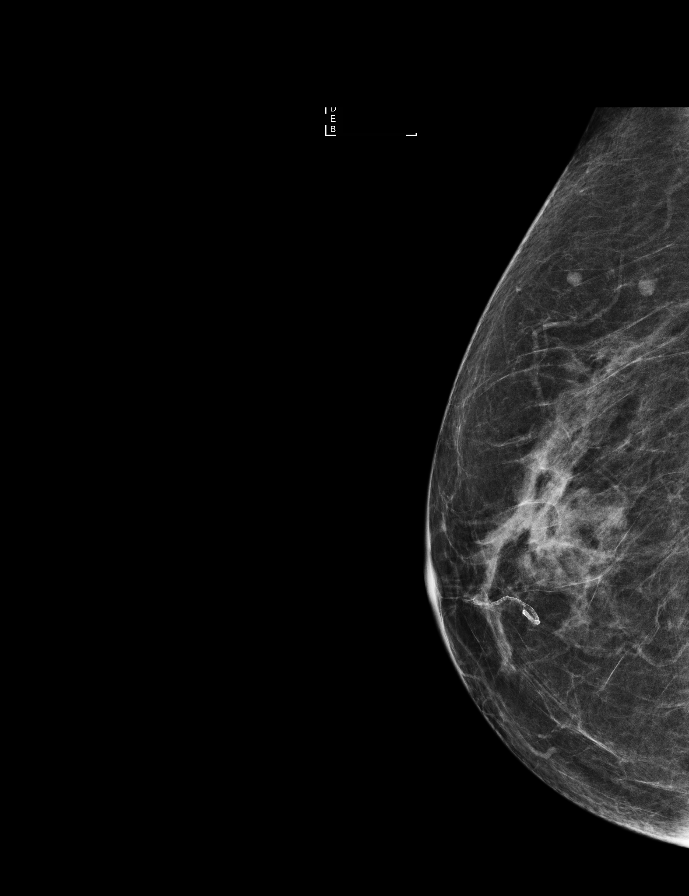

[R CC]
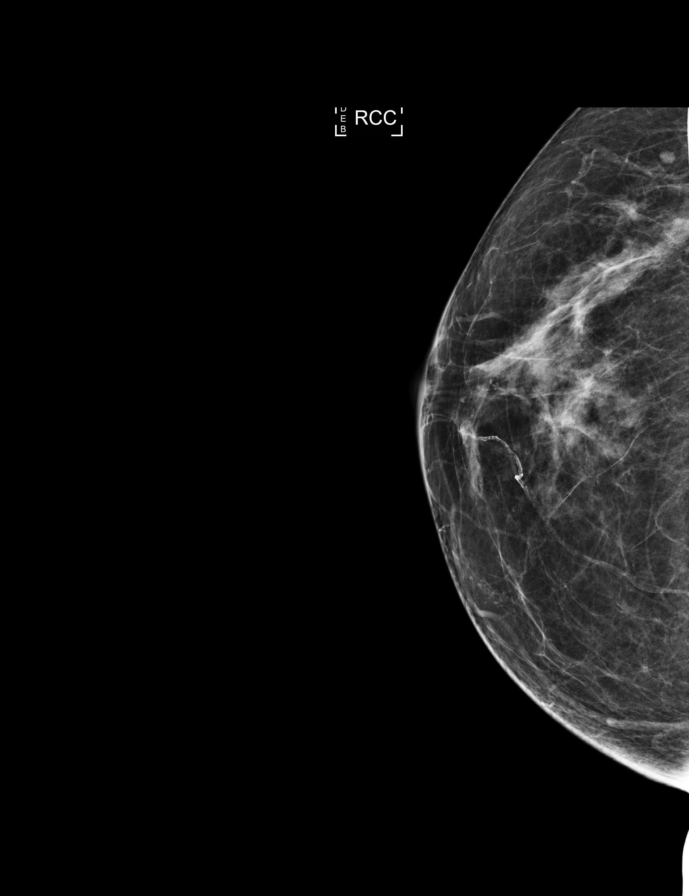

[R MLO]
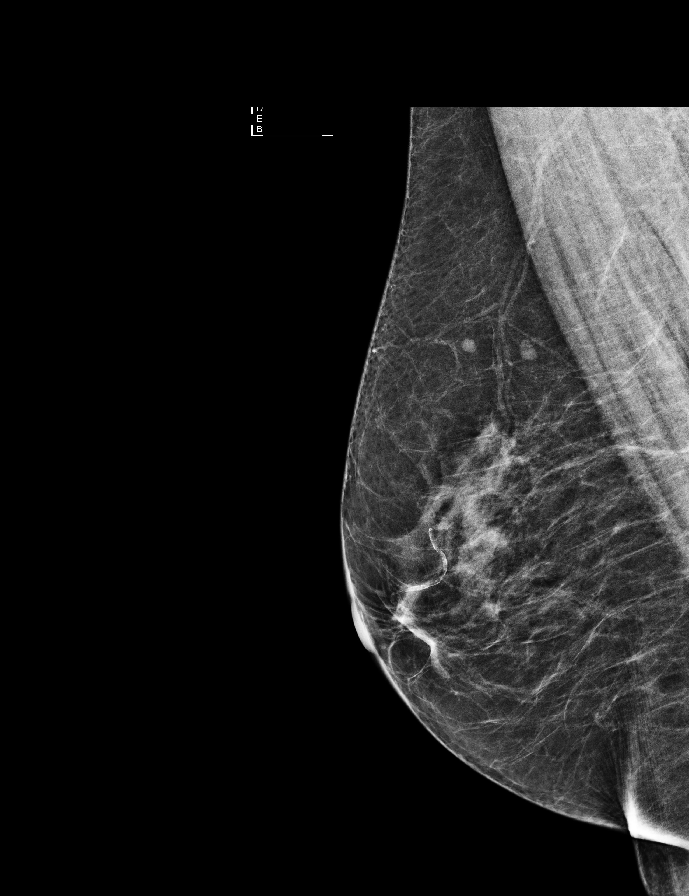

[L MLO synth-2D]
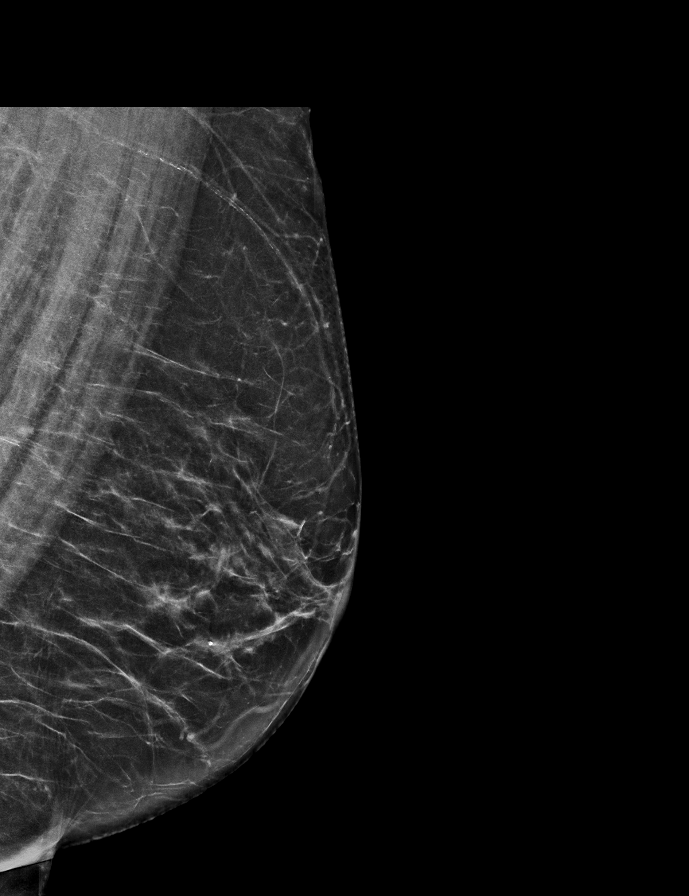

[L CC synth-2D]
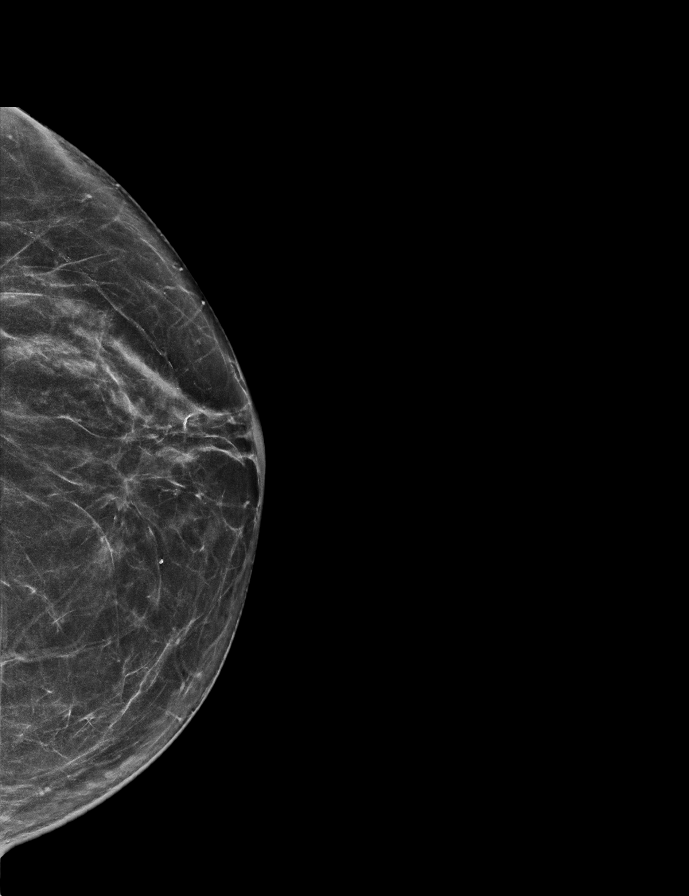

[L MLO]
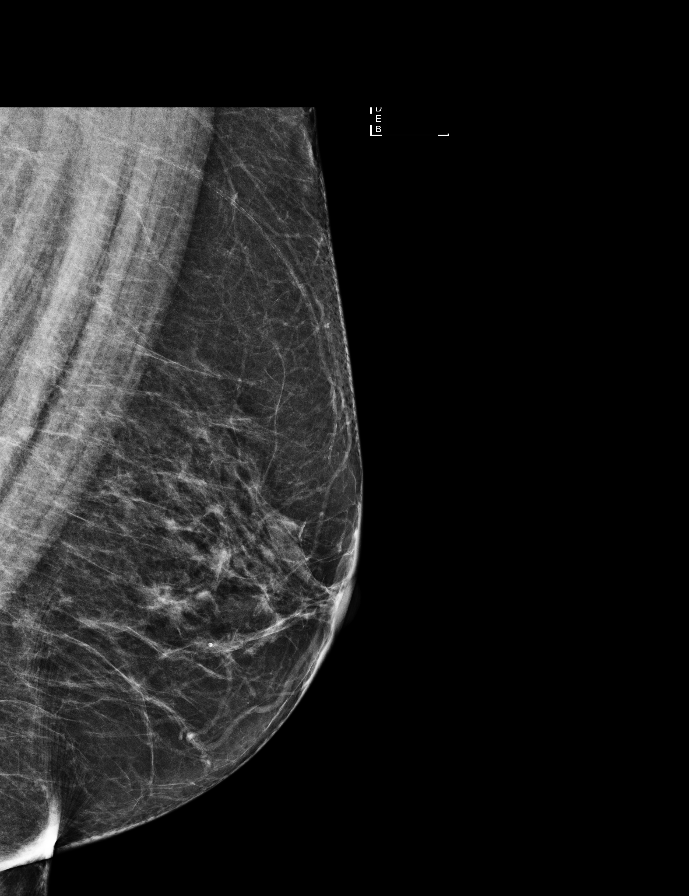

[L CC]
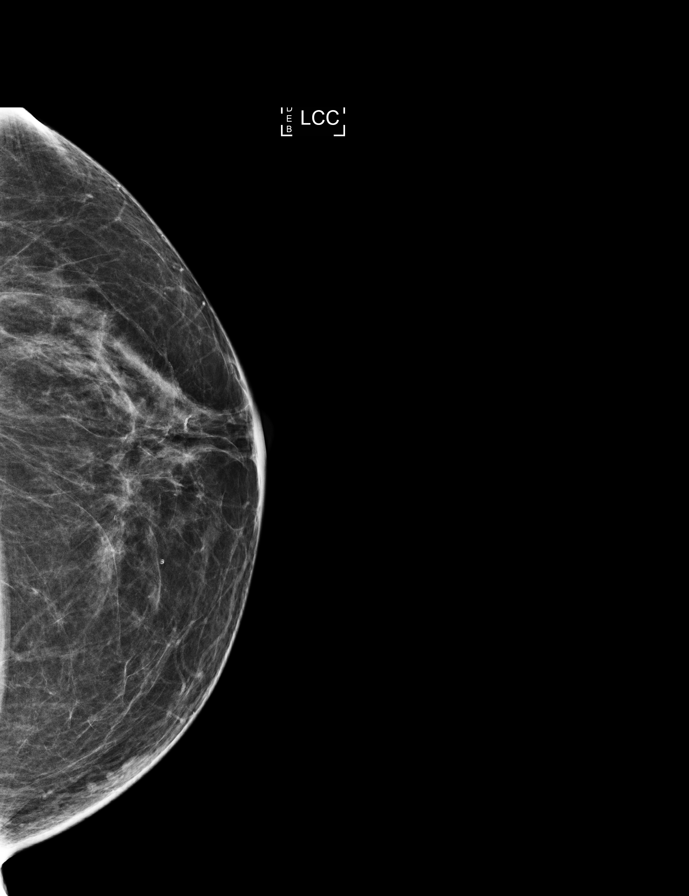

[R CC synth-2D]
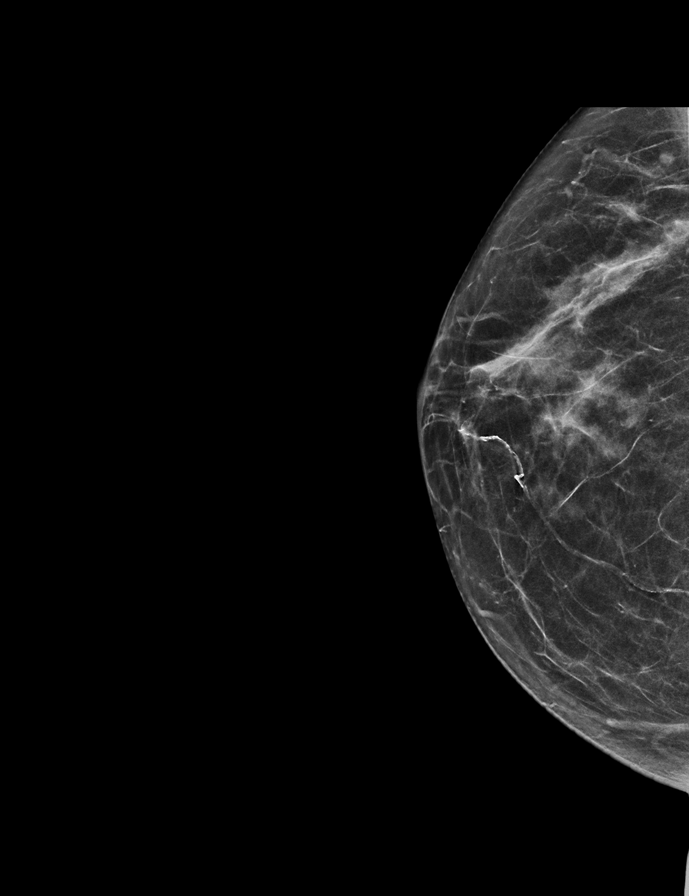

[R MLO synth-2D]
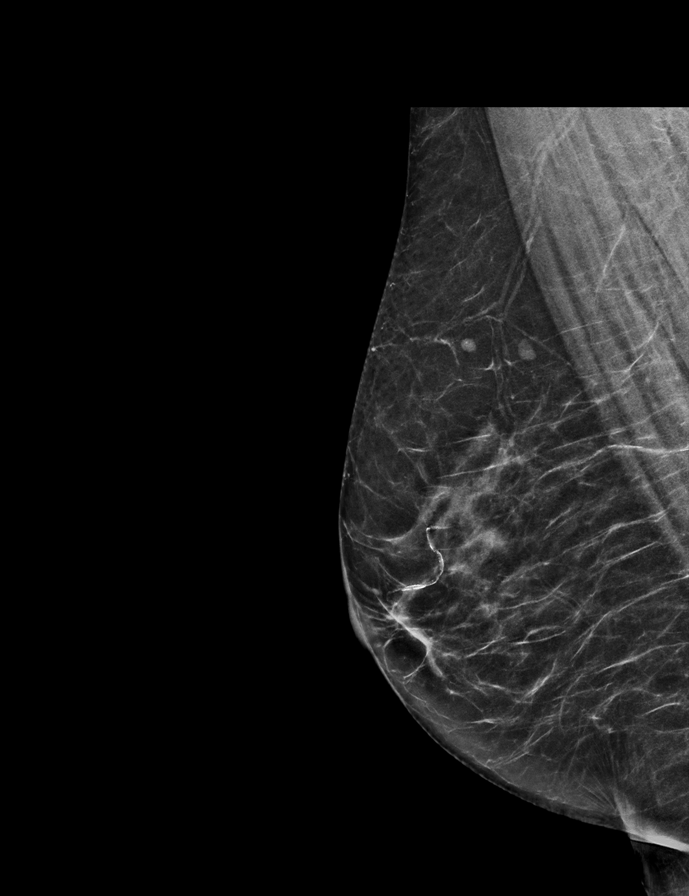

[9 of 29 positions shown; findings below may reference images not displayed]

ACR Breast Density Category b: There are scattered areas of
fibroglandular density.
FINDINGS: There are no findings suspicious for malignancy. Images were
processed with CAD.
IMPRESSION: No mammographic evidence of malignancy. A result letter of this
screening mammogram will be mailed directly to the patient.

RECOMMENDATION:
Screening mammogram in one year. (Code:[TQ])

BI-RADS CATEGORY  1: Negative.

## 2017-09-02 ENCOUNTER — Ambulatory Visit: Payer: Self-pay

## 2017-09-02 NOTE — Telephone Encounter (Signed)
Phone call from pt.  Reported she has something protruding from her vagina.  Stated this started about 2 weeks ago.  Reported that she exercises at the gym, regularly, and has noticed increased pressure in the vaginal area.  Denied any bleeding, discharge, pain.  Reported she has had some urinary leakage, and reported one episode in particular that she lost her urine, and had no control at all. Discussed importance of avoiding heavy lifting/ straining, avoid constipation, avoid douching, avoid use of feminine hygiene products, keep genital area clean/ dry, and wear cotton underwear.  Encouraged to call back if any worsening of sx's.  Appt. Given for 09/06/17 with PCP.  Verb. Understanding; agreed with plan.          Reason for Disposition . Feels like something inside is falling out of vagina (e.g., pressure, heaviness, fullness)  Answer Assessment - Initial Assessment Questions 1. SYMPTOM: "What's the main symptom you're concerned about?" (e.g., pain, itching, dryness)     Feeling that something is protruding  2. LOCATION: "Where is the  _______ located?" (e.g., inside/outside, left/right)     Outside vaginal area 3. ONSET: "When did the  ________  start?"     Over past 2 weeks;  4. PAIN: "Is there any pain?" If so, ask: "How bad is it?" (Scale: 1-10; mild, moderate, severe)     No pain 5. ITCHING: "Is there any itching?" If so, ask: "How bad is it?" (Scale: 1-10; mild, moderate or severe)   No itching  6. CAUSE: "What do you think is causing the symptoms?"     Exercising at the gym; jumping jacks, push ups, squats, planks, weight lifting with arms  7. OTHER SYMPTOMS: "Do you have any other symptoms?" (e.g., vaginal bleeding, pain with urination)     Pressure in vaginal area 8. PREGNANCY: "Is there any chance you are pregnant?" "When was your last menstrual period?"     No   7. CAUSE: "What do you think is causing the discharge?" "Have you had the same problem before? What happened  then?" No  8. OTHER SYMPTOMS: "Do you have any other symptoms?" (e.g., fever, itching, vaginal bleeding, pain with urination, injury to genital area, vaginal foreign body)  some urinary leakage; reported one episode of urinary incontinence without any control ; denied fever/ chills or burning with r  urination  Protocols used: VAGINAL [facility]

## 2017-09-06 ENCOUNTER — Ambulatory Visit (INDEPENDENT_AMBULATORY_CARE_PROVIDER_SITE_OTHER): Payer: Medicare HMO | Admitting: Family Medicine

## 2017-09-06 ENCOUNTER — Encounter: Payer: Self-pay | Admitting: Family Medicine

## 2017-09-06 VITALS — BP 148/70 | HR 66 | Temp 97.8°F | Wt 142.5 lb

## 2017-09-06 DIAGNOSIS — N8110 Cystocele, unspecified: Secondary | ICD-10-CM

## 2017-09-06 NOTE — Patient Instructions (Signed)
It looks like the roof of your vaginal vault is weaker and it is reasonable to see urology.  Rosaria Ferries will call about your referral. Limit straining in the meantime.  Take care.  Glad to see you.

## 2017-09-06 NOTE — Progress Notes (Signed)
Sx of prolapse in the last few weeks.  "Something feels different".  She used a Geologist, engineering and could see something is different after a workout.  She had been exercising, doing planks and lifting but didn't have sx at the time initially.  Still exercising, yoga.  She has been exercising at this level for years.  Now noting sx with exercise.  No burning with urination.  No FCNAVD.  Some urinary urgency.    Meds, vitals, and allergies reviewed.   ROS: Per HPI unless specifically indicated in ROS section   nad ncat rrr ctab Chaperoned exam.  The vaginal vault is normal in appearance initially.  She does not have obvious uterine prolapse that I can see.  The cervix is still normal in appearance.  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

## 2017-09-09 DIAGNOSIS — N8110 Cystocele, unspecified: Secondary | ICD-10-CM | POA: Insufficient documentation

## 2017-09-09 NOTE — Assessment & Plan Note (Signed)
Discussed with patient about anatomy and rationale for referral.  Refer to urology.  She agrees.

## 2017-09-13 ENCOUNTER — Encounter: Payer: Self-pay | Admitting: Urology

## 2017-09-13 ENCOUNTER — Ambulatory Visit (INDEPENDENT_AMBULATORY_CARE_PROVIDER_SITE_OTHER): Payer: Medicare HMO | Admitting: Urology

## 2017-09-13 VITALS — BP 182/80 | HR 77 | Ht 65.75 in | Wt 147.0 lb

## 2017-09-13 DIAGNOSIS — N8110 Cystocele, unspecified: Secondary | ICD-10-CM

## 2017-09-13 LAB — URINALYSIS, COMPLETE
BILIRUBIN UA: NEGATIVE
Glucose, UA: NEGATIVE
Ketones, UA: NEGATIVE
NITRITE UA: NEGATIVE
PH UA: 6 (ref 5.0–7.5)
Protein, UA: NEGATIVE
RBC UA: NEGATIVE
Specific Gravity, UA: 1.02 (ref 1.005–1.030)
UUROB: 0.2 mg/dL (ref 0.2–1.0)

## 2017-09-13 LAB — MICROSCOPIC EXAMINATION: BACTERIA UA: NONE SEEN

## 2017-09-13 NOTE — Progress Notes (Signed)
09/13/2017 10:24 AM   Desiree Ortega 1945/11/12 [id number]  Referring provider: Tonia Ghent, MD [address] [place], [place] [address]  Chief Complaint  Patient presents with  . New Patient (Initial Visit)    HPI: The patient was referred with bladder prolapse.  She can feel pressure and bulging in the vaginal area and sometimes reduces it.  She does not do splinting maneuvers.  She has not had a hysterectomy.  At baseline she rarely leaks with a cough or sneeze and generally is dry during the day.  She voids every 2 or 3 hours and has no nocturia.  Prolapse symptoms noted 2 weeks ago.  She does not have a local gynecologist  She passed 1 kidney stone.  She denies a history of urinary tract infections and previous GU surgery.  She has no neurologic issues.  Bowel movements normal  Modifying factors: There are no other modifying factors  Associated signs and symptoms: There are no other associated signs and symptoms Aggravating and relieving factors: There are no other aggravating or relieving factors Severity: Moderate Duration: Persistent   PMH: Past Medical History:  Diagnosis Date  . Allergy   . 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 Evorn Gong  . H/O cold sores   . Hyperlipidemia   . Migraine with aura   . Osteopenia    prev on fosamax for a few years then started evista at age ~25, T score improved  from -2.29/-1.96 to -1.8/-1.7 as of 06/2011, repeat DXA done 2015    Surgical History: Past Surgical History:  Procedure Laterality Date  . BREAST BIOPSY Left    benign  . CATARACT EXTRACTION W/ INTRAOCULAR LENS IMPLANT Left 11/2016  . CATARACT EXTRACTION W/ INTRAOCULAR LENS IMPLANT Right 12/22/2016  . cyst on back     2 times  . cyst on eyelid     right eye  . SQUAMOUS CELL CARCINOMA EXCISION     on back/removed 2 times/and thigh  . SQUAMOUS CELL CARCINOMA EXCISION Left 07/15/2016   left lower calf  .  SSC removed  1988   Birthmark removal  . TONSILLECTOMY  1950  . TUBAL LIGATION  1980's    Home Medications:  Allergies as of 09/13/2017      Reactions   Alendronate Sodium    heartburn   Codeine    REACTION: NAUSEA   Influenza Vaccines    Local reaction to vaccine in 2014      Medication List        Accurate as of 09/13/17 10:24 AM. Always use your most recent med list.          acetaminophen 325 MG tablet Commonly known as:  TYLENOL Take 650 mg by mouth every 6 (six) hours as needed.   b complex vitamins tablet Take 1 tablet by mouth daily.   B-12 PO Take 1 capsule by mouth daily.   CALCIUM/VITAMIN D PO Take 1 tablet by mouth daily.   CINNAMON PO Take 1,000 mg by mouth daily.   fexofenadine 180 MG tablet Commonly known as:  ALLEGRA Take 180 mg by mouth daily as needed.   FISH OIL OMEGA-3 PO Take 2 capsules by mouth daily.   FLAXSEED OIL PO Take 1,200 mg by mouth daily.   MAGNESIUM PO Take 1 tablet by mouth daily.   multivitamin tablet Take 1 tablet by mouth daily.   OSTEO BI-FLEX JOINT SHIELD Tabs Take by  mouth 2 (two) times daily as needed.   pseudoephedrine-acetaminophen 30-500 MG Tabs tablet Commonly known as:  TYLENOL SINUS Take 1 tablet by mouth every 4 (four) hours as needed.   TURMERIC CURCUMIN PO Take by mouth.   UBIQUINOL PO Take 1 tablet by mouth daily.   valACYclovir 1000 MG tablet Commonly known as:  VALTREX Take 2 tablets (2,000 mg total) by mouth 2 (two) times daily. For one day for cold sore   vitamin C 500 MG tablet Commonly known as:  ASCORBIC ACID Take 1,000 mg by mouth 2 (two) times daily.   VITAMIN D-3 PO Take 2,000 mg by mouth daily.   ZINC PO Take 1 tablet by mouth daily.       Allergies:  Allergies  Allergen Reactions  . Alendronate Sodium     heartburn  . Codeine     REACTION: NAUSEA  . Influenza Vaccines     Local reaction to vaccine in 2014    Family History: Family History  Problem Relation  Age of Onset  . Osteopenia Mother   . Hypertension Father   . Heart disease Father        AFIB, PVD stents LE's, pacer  . Stroke Father   . Cancer Brother        prostate, prostatectomy, radiation 5 years later, Hormone Tx  . Prostate cancer Brother   . Heart disease Brother   . Cancer Brother        prostate, prostatectomy  . Prostate cancer Brother   . Diabetes Other   . Cancer Other        Non-Hodgkin's Lymphoma  . Depression Daughter   . Alcohol abuse Neg Hx   . Drug abuse Neg Hx   . Colon cancer Neg Hx   . Breast cancer Neg Hx   . Bladder Cancer Neg Hx   . Kidney cancer Neg Hx     Social History:  reports that she quit smoking about 16 years ago. Her smoking use included cigarettes. She has a 18.00 pack-year smoking history. She has never used smokeless tobacco. She reports that she drinks alcohol. She reports that she does not use drugs.  ROS: UROLOGY Frequent Urination?: Yes Hard to postpone urination?: No Burning/pain with urination?: No Get up at night to urinate?: No Leakage of urine?: No Urine stream starts and stops?: No Trouble starting stream?: No Do you have to strain to urinate?: No Blood in urine?: No Urinary tract infection?: No Sexually transmitted disease?: No Injury to kidneys or bladder?: No Painful intercourse?: No Weak stream?: No Currently pregnant?: No Vaginal bleeding?: No Last menstrual period?: n  Gastrointestinal Nausea?: No Vomiting?: No Indigestion/heartburn?: No Diarrhea?: No Constipation?: No  Constitutional Fever: No Night sweats?: No Weight loss?: No Fatigue?: No  Skin Skin rash/lesions?: No Itching?: No  Eyes Blurred vision?: No Double vision?: No  Ears/Nose/Throat Sore throat?: No Sinus problems?: No  Hematologic/Lymphatic Swollen glands?: No Easy bruising?: No  Cardiovascular Leg swelling?: No Chest pain?: No  Respiratory Cough?: No Shortness of breath?: No  Endocrine Excessive thirst?:  No  Musculoskeletal Back pain?: No Joint pain?: No  Neurological Headaches?: No Dizziness?: No  Psychologic Depression?: No Anxiety?: No  Physical Exam: BP (!) 182/80 (BP Location: Right Arm, Patient Position: Sitting, Cuff Size: Normal)   Pulse 77   Ht 5' 5.75" (1.67 m)   Wt 147 lb (66.7 kg)   BMI 23.91 kg/m   Constitutional:  Alert and oriented, No acute distress. HEENT: [place] AT,  moist mucus membranes.  Trachea midline, no masses. Cardiovascular: No clubbing, cyanosis, or edema. Respiratory: Normal respiratory effort, no increased work of breathing. GI: Abdomen is soft, nontender, nondistended, no abdominal masses GU: On pelvic examination the patient had a moderate size grade 2 cystocele that does reach beyond the urethrovesical angle.  Her bladder neck did not descend and she had no stress incontinence.  She had a small distal rectocele.  Uterus descended from 8 or 9 cm to approximately 6 or 7 cm but she did not cough really hard. Skin: No rashes, bruises or suspicious lesions. Lymph: No cervical or inguinal adenopathy. Neurologic: Grossly intact, no focal deficits, moving all 4 extremities. Psychiatric: Normal mood and affect.  Laboratory Data: Lab Results  Component Value Date   WBC 7.3 06/12/2009   HGB 14.6 06/12/2009   HCT 43.3 06/12/2009   MCV 92.7 06/12/2009   PLT 202.0 06/12/2009    Lab Results  Component Value Date   CREATININE 0.71 08/04/2017    No results found for: PSA  No results found for: TESTOSTERONE  No results found for: HGBA1C  Urinalysis No results found for: COLORURINE, APPEARANCEUR, LABSPEC, PHURINE, GLUCOSEU, HGBUR, BILIRUBINUR, KETONESUR, PROTEINUR, UROBILINOGEN, NITRITE, LEUKOCYTESUR  Pertinent Imaging: none  Assessment & Plan: The patient has symptoms of prolapse.  She has rare stress incontinence.  She has minimal frequency.  A picture was drawn.  If the patient ever had surgery she would likely best benefit from a transvaginal  hysterectomy with cystocele repair.  She would be consented for vault suspension and graft but fixation of the cuff to the ureteral sacral ligaments may be fine.  She would be consented for rectocele repair though she may not need one.  She did not have a lot of bulging but it was rather distal.  Natural history and watchful waiting versus urodynamics discussed.  The prolapse when she wakes up in the morning is minimally symptomatic that bothers her by the end of the day especially when she is in the gym.  She understands I do these cases usually in [place].  She will also see Dr. Garwin Brothers.   1. Female cystocele 2.  Stress urinary incontinence - Urinalysis, Complete   No follow-ups on file.  Reece Packer, MD  [facility] [address] [place], [place] [address] [phone]

## 2017-09-20 DIAGNOSIS — N8111 Cystocele, midline: Secondary | ICD-10-CM | POA: Diagnosis not present

## 2017-09-27 DIAGNOSIS — Z01419 Encounter for gynecological examination (general) (routine) without abnormal findings: Secondary | ICD-10-CM | POA: Diagnosis not present

## 2017-10-15 DIAGNOSIS — H524 Presbyopia: Secondary | ICD-10-CM | POA: Diagnosis not present

## 2017-10-15 DIAGNOSIS — Z961 Presence of intraocular lens: Secondary | ICD-10-CM | POA: Diagnosis not present

## 2017-10-15 DIAGNOSIS — H04123 Dry eye syndrome of bilateral lacrimal glands: Secondary | ICD-10-CM | POA: Diagnosis not present

## 2017-11-01 ENCOUNTER — Ambulatory Visit: Payer: Medicare HMO

## 2017-11-09 DIAGNOSIS — R350 Frequency of micturition: Secondary | ICD-10-CM | POA: Diagnosis not present

## 2017-11-10 ENCOUNTER — Other Ambulatory Visit: Payer: Self-pay | Admitting: Urology

## 2017-11-22 ENCOUNTER — Encounter: Payer: Self-pay | Admitting: Urology

## 2017-11-22 ENCOUNTER — Ambulatory Visit: Payer: Medicare HMO | Admitting: Urology

## 2017-11-22 VITALS — BP 133/68 | HR 62 | Ht 66.0 in | Wt 148.0 lb

## 2017-11-22 DIAGNOSIS — N8111 Cystocele, midline: Secondary | ICD-10-CM

## 2017-11-22 NOTE — Progress Notes (Signed)
11/22/2017 10:19 AM   [name] [birth year] [id number]  Referring provider: Tonia Ghent, MD [address] [place], [place] [address]  Chief Complaint  Patient presents with  . Results    UDS    HPI: The patient was referred with bladder prolapse.  She can feel pressure and bulging in the vaginal area and sometimes reduces it.  She does not do splinting maneuvers.  She has not had a hysterectomy.  At baseline she rarely leaks with a cough or sneeze and generally is dry during the day.  She voids every 2 or 3 hours and has no nocturia.  Prolapse symptoms noted 2 weeks ago.  She does not have a local gynecologist  GU: On pelvic examination the patient had a moderate size grade 2 cystocele that does reach beyond the urethrovesical angle.  Her bladder neck did not descend and she had no stress incontinence.  She had a small distal rectocele.  Uterus descended from 8 or 9 cm to approximately 6 or 7 cm but she did not cough really hard.  The patient has symptoms of prolapse.  She has rare stress incontinence.  She has minimal frequency.  A picture was drawn.  If the patient ever had surgery she would likely best benefit from a transvaginal hysterectomy with cystocele repair.  She would be consented for vault suspension and graft but fixation of the cuff to the ureteral sacral ligaments may be fine.  She would be consented for rectocele repair though she may not need one.  She did not have a lot of bulging but it was rather distal.  Natural history and watchful waiting versus urodynamics discussed.  The prolapse when she wakes up in the morning is minimally symptomatic that bothers her by the end of the day especially when she is in the gym.  She understands I do these cases usually in [place].  She will also see Dr. Garwin Brothers.   Today Frequency and prolapse are stable On urodynamics the patient did not void and was catheterized for 100 mL.  Bladder capacity was 670 mL.   Bladder was stable.  She did not leak with and without the prolapse reduced at a pressure of 107 cm of water.  During voluntary voiding she voided 390 mL with a maximum flow 22 mils per second.  Maximum voiding pressure was 17 cm of water.  Residual was 280 mL.  She had a better flow with vaginal packing in place.  EMG activity increased during the voiding phase.  Bladder neck descended 3 cm.  Fluoroscopically she had a moderate cystocele noted.  She did have some trouble voiding in the lab setting.  The cystocele appeared to be larger than it did on pelvic examination.  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    PMH: Past Medical History:  Diagnosis Date  . Allergy   . 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 Evorn Gong  . H/O cold sores   . Hyperlipidemia   . Migraine with aura   . Osteopenia    prev on fosamax for a few years then started evista at age ~56, T score improved  from -2.29/-1.96 to -1.8/-1.7 as of 06/2011, repeat DXA done 2015    Surgical History: Past Surgical History:  Procedure Laterality Date  . BREAST BIOPSY Left    benign  . CATARACT EXTRACTION W/ INTRAOCULAR LENS IMPLANT Left 11/2016  .  CATARACT EXTRACTION W/ INTRAOCULAR LENS IMPLANT Right 12/22/2016  . cyst on back     2 times  . cyst on eyelid     right eye  . SQUAMOUS CELL CARCINOMA EXCISION     on back/removed 2 times/and thigh  . SQUAMOUS CELL CARCINOMA EXCISION Left 07/15/2016   left lower calf  . SSC removed  1988   Birthmark removal  . TONSILLECTOMY  1950  . TUBAL LIGATION  1980's    Home Medications:  Allergies as of 11/22/2017      Reactions   Alendronate Sodium    heartburn   Codeine    REACTION: NAUSEA   Influenza Vaccines    Local reaction to vaccine in 2014      Medication List        Accurate as of 11/22/17 10:19 AM. Always use your most recent med list.          acetaminophen 325 MG tablet Commonly known as:   TYLENOL Take 650 mg by mouth every 6 (six) hours as needed.   b complex vitamins tablet Take 1 tablet by mouth daily.   B-12 PO Take 1 capsule by mouth daily.   CALCIUM/VITAMIN D PO Take 1 tablet by mouth daily.   CINNAMON PO Take 1,000 mg by mouth daily.   fexofenadine 180 MG tablet Commonly known as:  ALLEGRA Take 180 mg by mouth daily as needed.   FISH OIL OMEGA-3 PO Take 2 capsules by mouth daily.   FLAXSEED OIL PO Take 1,200 mg by mouth daily.   MAGNESIUM PO Take 1 tablet by mouth daily.   multivitamin tablet Take 1 tablet by mouth daily.   OSTEO BI-FLEX JOINT SHIELD Tabs Take by mouth 2 (two) times daily as needed.   pseudoephedrine-acetaminophen 30-500 MG Tabs tablet Commonly known as:  TYLENOL SINUS Take 1 tablet by mouth every 4 (four) hours as needed.   TURMERIC CURCUMIN PO Take by mouth.   UBIQUINOL PO Take 1 tablet by mouth daily.   valACYclovir 1000 MG tablet Commonly known as:  VALTREX Take 2 tablets (2,000 mg total) by mouth 2 (two) times daily. For one day for cold sore   vitamin C 500 MG tablet Commonly known as:  ASCORBIC ACID Take 1,000 mg by mouth 2 (two) times daily.   VITAMIN D-3 PO Take 2,000 mg by mouth daily.   ZINC PO Take 1 tablet by mouth daily.       Allergies:  Allergies  Allergen Reactions  . Alendronate Sodium     heartburn  . Codeine     REACTION: NAUSEA  . Influenza Vaccines     Local reaction to vaccine in 2014    Family History: Family History  Problem Relation Age of Onset  . Osteopenia Mother   . Hypertension Father   . Heart disease Father        AFIB, PVD stents LE's, pacer  . Stroke Father   . Cancer Brother        prostate, prostatectomy, radiation 5 years later, Hormone Tx  . Prostate cancer Brother   . Heart disease Brother   . Cancer Brother        prostate, prostatectomy  . Prostate cancer Brother   . Diabetes Other   . Cancer Other        Non-Hodgkin's Lymphoma  . Depression  Daughter   . Alcohol abuse Neg Hx   . Drug abuse Neg Hx   . Colon cancer Neg Hx   .  Breast cancer Neg Hx   . Bladder Cancer Neg Hx   . Kidney cancer Neg Hx     Social History:  reports that she quit smoking about 16 years ago. Her smoking use included cigarettes. She has a 18.00 pack-year smoking history. She has never used smokeless tobacco. She reports that she drinks alcohol. She reports that she does not use drugs.  ROS: UROLOGY Frequent Urination?: Yes Hard to postpone urination?: No Burning/pain with urination?: No Get up at night to urinate?: No Leakage of urine?: No Urine stream starts and stops?: No Trouble starting stream?: Yes Do you have to strain to urinate?: No Blood in urine?: No Urinary tract infection?: No Sexually transmitted disease?: No Injury to kidneys or bladder?: No Painful intercourse?: No Weak stream?: No Currently pregnant?: No Vaginal bleeding?: No Last menstrual period?: n  Gastrointestinal Nausea?: No Vomiting?: No Indigestion/heartburn?: No Diarrhea?: No Constipation?: No  Constitutional Fever: No Night sweats?: No Weight loss?: No Fatigue?: No  Skin Skin rash/lesions?: No Itching?: No  Eyes Blurred vision?: No Double vision?: No  Ears/Nose/Throat Sore throat?: No Sinus problems?: No  Hematologic/Lymphatic Swollen glands?: No Easy bruising?: No  Cardiovascular Leg swelling?: No Chest pain?: No  Respiratory Cough?: No Shortness of breath?: No  Endocrine Excessive thirst?: No  Musculoskeletal Back pain?: Yes Joint pain?: No  Neurological Headaches?: No Dizziness?: No  Psychologic Depression?: No Anxiety?: No  Physical Exam: BP 133/68   Pulse 62   Ht 5\' 6"  (1.676 m)   Wt 67.1 kg (148 lb)   BMI 23.89 kg/m   Constitutional:  Alert and oriented, No acute distress.   Laboratory Data: Lab Results  Component Value Date   WBC 7.3 06/12/2009   HGB 14.6 06/12/2009   HCT 43.3 06/12/2009   MCV 92.7  06/12/2009   PLT 202.0 06/12/2009    Lab Results  Component Value Date   CREATININE 0.71 08/04/2017    No results found for: PSA  No results found for: TESTOSTERONE  No results found for: HGBA1C  Urinalysis    Component Value Date/Time   APPEARANCEUR Clear 09/13/2017 1008   GLUCOSEU Negative 09/13/2017 1008   BILIRUBINUR Negative 09/13/2017 1008   PROTEINUR Negative 09/13/2017 1008   NITRITE Negative 09/13/2017 1008   LEUKOCYTESUR Trace (A) 09/13/2017 1008    Pertinent Imaging:   Assessment & Plan: Picture was drawn.  Pessary versus watchful waiting first prolapse surgery discussed.  I went over in detail with my usual template a transvaginal vault suspension with cystocele repair and graft recognizing she may not need the vault suspension.  She was also consented for rectocele repair and intraoperative decision would be made.  Mesh issues discussed.  I do not believe there is evidence to recommend a sling.  Did no low or worsening incontinence discussed with severity.  There may have been a mixup but the patient did not see Dr. Garwin Brothers.  She understands why I recommend a simultaneous hysterectomy.  I will call and we will follow-up accordingly.  She is not sexually active and did not wish to have a pessary  There are no diagnoses linked to this encounter.  No follow-ups on file.  Reece Packer, MD  [facility] [address] [place], [place] [address] [phone]

## 2017-12-09 DIAGNOSIS — N814 Uterovaginal prolapse, unspecified: Secondary | ICD-10-CM | POA: Diagnosis not present

## 2017-12-09 DIAGNOSIS — N8111 Cystocele, midline: Secondary | ICD-10-CM | POA: Diagnosis not present

## 2018-01-18 ENCOUNTER — Other Ambulatory Visit: Payer: Self-pay | Admitting: Urology

## 2018-01-20 ENCOUNTER — Other Ambulatory Visit: Payer: Self-pay | Admitting: Obstetrics and Gynecology

## 2018-01-26 ENCOUNTER — Encounter (HOSPITAL_COMMUNITY): Payer: Self-pay

## 2018-01-26 NOTE — Patient Instructions (Addendum)
Your procedure is scheduled on: Tuesday, Aug. 13, 2019   Surgery Time:  7:30AM-11:30AM   Report to [place]  Entrance    Report to admitting at 5:30 AM   Call this number if you have problems the morning of surgery [phone]   Use a fleet enema the night before surgery   Do not eat food or drink liquids :After Midnight.   Do NOT smoke after Midnight   Take these medicines the morning of surgery with A SIP OF WATER: None                               You may not have any metal on your body including hair pins, jewelry, and body piercings             Do not wear make-up, lotions, powders, perfumes/cologne, or deodorant             Do not wear nail polish.  Do not shave  48 hours prior to surgery.              Do not bring valuables to the hospital. [place].   Contacts, dentures or bridgework may not be worn into surgery.   Leave suitcase in the car. After surgery it may be brought to your room.   Special Instructions: Bring a copy of your healthcare power of attorney and living will documents         the day of surgery if you haven't scanned them in before.              Please read over the following fact sheets you were given:  [facility] - Preparing for Surgery Before surgery, you can play an important role.  Because skin is not sterile, your skin needs to be as free of germs as possible.  You can reduce the number of germs on your skin by washing with CHG (chlorahexidine gluconate) soap before surgery.  CHG is an antiseptic cleaner which kills germs and bonds with the skin to continue killing germs even after washing. Please DO NOT use if you have an allergy to CHG or antibacterial soaps.  If your skin becomes reddened/irritated stop using the CHG and inform your nurse when you arrive at Short Stay. Do not shave (including legs and underarms) for at least 48 hours prior to the first CHG shower.  You may  shave your face/neck.  Please follow these instructions carefully:  1.  Shower with CHG Soap the night before surgery and the  morning of surgery.  2.  If you choose to wash your hair, wash your hair first as usual with your normal  shampoo.  3.  After you shampoo, rinse your hair and body thoroughly to remove the shampoo.                             4.  Use CHG as you would any other liquid soap.  You can apply chg directly to the skin and wash.  Gently with a scrungie or clean washcloth.  5.  Apply the CHG Soap to your body ONLY FROM THE NECK DOWN.   Do   not use on face/ open  Wound or open sores. Avoid contact with eyes, ears mouth and   genitals (private parts).                       Wash face,  Genitals (private parts) with your normal soap.             6.  Wash thoroughly, paying special attention to the area where your    surgery  will be performed.  7.  Thoroughly rinse your body with warm water from the neck down.  8.  DO NOT shower/wash with your normal soap after using and rinsing off the CHG Soap.                9.  Pat yourself dry with a clean towel.            10.  Wear clean pajamas.            11.  Place clean sheets on your bed the night of your first shower and do not  sleep with pets. Day of Surgery : Do not apply any lotions/deodorants the morning of surgery.  Please wear clean clothes to the hospital/surgery center.  FAILURE TO FOLLOW THESE INSTRUCTIONS MAY RESULT IN THE CANCELLATION OF YOUR SURGERY  PATIENT SIGNATURE_________________________________  NURSE SIGNATURE__________________________________  ________________________________________________________________________  WHAT IS A BLOOD TRANSFUSION? Blood Transfusion Information  A transfusion is the replacement of blood or some of its parts. Blood is made up of multiple cells which provide different functions.  Red blood cells carry oxygen and are used for blood loss  replacement.  White blood cells fight against infection.  Platelets control bleeding.  Plasma helps clot blood.  Other blood products are available for specialized needs, such as hemophilia or other clotting disorders. BEFORE THE TRANSFUSION  Who gives blood for transfusions?   Healthy volunteers who are fully evaluated to make sure their blood is safe. This is blood bank blood. Transfusion therapy is the safest it has ever been in the practice of medicine. Before blood is taken from a donor, a complete history is taken to make sure that person has no history of diseases nor engages in risky social behavior (examples are intravenous drug use or sexual activity with multiple partners). The donor's travel history is screened to minimize risk of transmitting infections, such as malaria. The donated blood is tested for signs of infectious diseases, such as HIV and hepatitis. The blood is then tested to be sure it is compatible with you in order to minimize the chance of a transfusion reaction. If you or a relative donates blood, this is often done in anticipation of surgery and is not appropriate for emergency situations. It takes many days to process the donated blood. RISKS AND COMPLICATIONS Although transfusion therapy is very safe and saves many lives, the main dangers of transfusion include:   Getting an infectious disease.  Developing a transfusion reaction. This is an allergic reaction to something in the blood you were given. Every precaution is taken to prevent this. The decision to have a blood transfusion has been considered carefully by your caregiver before blood is given. Blood is not given unless the benefits outweigh the risks. AFTER THE TRANSFUSION  Right after receiving a blood transfusion, you will usually feel much better and more energetic. This is especially true if your red blood cells have gotten low (anemic). The transfusion raises the level of the red blood cells which  carry oxygen, and this usually  causes an energy increase.  The nurse administering the transfusion will monitor you carefully for complications. HOME CARE INSTRUCTIONS  No special instructions are needed after a transfusion. You may find your energy is better. Speak with your caregiver about any limitations on activity for underlying diseases you may have. SEEK MEDICAL CARE IF:   Your condition is not improving after your transfusion.  You develop redness or irritation at the intravenous (IV) site. SEEK IMMEDIATE MEDICAL CARE IF:  Any of the following symptoms occur over the next 12 hours:  Shaking chills.  You have a temperature by mouth above 102 F (38.9 C), not controlled by medicine.  Chest, back, or muscle pain.  People around you feel you are not acting correctly or are confused.  Shortness of breath or difficulty breathing.  Dizziness and fainting.  You get a rash or develop hives.  You have a decrease in urine output.  Your urine turns a dark color or changes to pink, red, or brown. Any of the following symptoms occur over the next 10 days:  You have a temperature by mouth above 102 F (38.9 C), not controlled by medicine.  Shortness of breath.  Weakness after normal activity.  The white part of the eye turns yellow (jaundice).  You have a decrease in the amount of urine or are urinating less often.  Your urine turns a dark color or changes to pink, red, or brown. Document Released: 06/05/2000 Document Revised: 08/31/2011 Document Reviewed: 01/23/2008 [facility] Patient Information 2014 [place], [place].  _______________________________________________________________________

## 2018-01-27 ENCOUNTER — Encounter (HOSPITAL_COMMUNITY): Payer: Self-pay

## 2018-01-27 ENCOUNTER — Other Ambulatory Visit: Payer: Self-pay

## 2018-01-27 ENCOUNTER — Encounter (HOSPITAL_COMMUNITY)
Admission: RE | Admit: 2018-01-27 | Discharge: 2018-01-27 | Disposition: A | Discharge status: Home / Self Care | Payer: Medicare HMO | Source: Ambulatory Visit | Attending: Urology | Admitting: Urology

## 2018-01-27 DIAGNOSIS — Z01818 Encounter for other preprocedural examination: Secondary | ICD-10-CM | POA: Diagnosis not present

## 2018-01-27 DIAGNOSIS — N816 Rectocele: Secondary | ICD-10-CM | POA: Diagnosis not present

## 2018-01-27 DIAGNOSIS — N814 Uterovaginal prolapse, unspecified: Secondary | ICD-10-CM | POA: Insufficient documentation

## 2018-01-27 HISTORY — DX: Cystocele, unspecified: N81.10

## 2018-01-27 HISTORY — DX: Personal history of colonic polyps: Z86.010

## 2018-01-27 HISTORY — DX: Diverticulosis of intestine, part unspecified, without perforation or abscess without bleeding: K57.90

## 2018-01-27 HISTORY — DX: Personal history of urinary calculi: Z87.442

## 2018-01-27 HISTORY — DX: Personal history of colon polyps, unspecified: Z86.0100

## 2018-01-27 LAB — BASIC METABOLIC PANEL
Anion gap: 9 (ref 5–15)
BUN: 16 mg/dL (ref 8–23)
CHLORIDE: 103 mmol/L (ref 98–111)
CO2: 29 mmol/L (ref 22–32)
CREATININE: 0.61 mg/dL (ref 0.44–1.00)
Calcium: 9.2 mg/dL (ref 8.9–10.3)
GFR calc non Af Amer: >60 mL/min (ref >60)
Glucose, Bld: 88 mg/dL (ref 70–99)
Potassium: 4.1 mmol/L (ref 3.5–5.1)
Sodium: 141 mmol/L (ref 135–145)

## 2018-01-27 LAB — CBC
HCT: 45.5 % (ref 36.0–46.0)
Hemoglobin: 15.3 g/dL — ABNORMAL HIGH (ref 12.0–15.0)
MCH: 30.2 pg (ref 26.0–34.0)
MCHC: 33.6 g/dL (ref 30.0–36.0)
MCV: 89.7 fL (ref 78.0–100.0)
PLATELETS: 242 10*3/uL (ref 150–400)
RBC: 5.07 MIL/uL (ref 3.87–5.11)
RDW: 12.5 % (ref 11.5–15.5)
WBC: 7.4 10*3/uL (ref 4.0–10.5)

## 2018-01-27 LAB — PROTIME-INR
INR: 0.9
Prothrombin Time: 12.1 seconds (ref 11.4–15.2)

## 2018-01-27 LAB — ABO/RH: ABO/RH(D): O POS

## 2018-01-27 MED ORDER — FLEET ENEMA 7-19 GM/118ML RE ENEM: 1.0000 | ENEMA | Freq: Once | RECTAL | Status: DC

## 2018-01-27 NOTE — Pre-Procedure Instructions (Signed)
CBC results 01/27/18 faxed to Dr. Matilde Sprang via epic.

## 2018-01-28 NOTE — H&P (Signed)
HPI: The patient was referred with bladder prolapse. She can feel pressure and bulging in the vaginal area and sometimes reduces it. She does not do splinting maneuvers. She has not had a hysterectomy.  At baseline she rarely leaks with a cough or sneeze and generally is dry during the day. She voids every 2 or 3 hours and has no nocturia.  Prolapse symptoms noted 2 weeks ago. She does not have a local gynecologist  GU:On pelvic examination the patient had a moderate size grade 2 cystocele that does reach beyond the urethrovesical angle. Her bladder neck did not descend and she had no stress incontinence. She had a small distal rectocele. Uterus descended from 8 or 9 cm to approximately 6 or 7 cm but she did not cough really hard.  The patient has symptoms of prolapse. She has rare stress incontinence. She has minimal frequency. A picture was drawn. If the patient ever had surgery she would likely best benefit from a transvaginal hysterectomy with cystocele repair. She would be consented for vault suspension and graft but fixation of the cuff to the ureteral sacral ligaments may be fine. She would be consented for rectocele repair though she may not need one. She did not have a lot of bulging but it was rather distal. Natural history and watchful waiting versus urodynamics discussed. The prolapse when she wakes up in the morning is minimally symptomatic that bothers her by the end of the day especially when she is in the gym. She understands I do these cases usually in [place]. She will also see Dr. Garwin Brothers.  Today Frequency and prolapse are stable On urodynamics the patient did not void and was catheterized for 100 mL.  Bladder capacity was 670 mL.  Bladder was stable.  She did not leak with and without the prolapse reduced at a pressure of 107 cm of water.  During voluntary voiding she voided 390 mL with a maximum flow 22 mils per second.  Maximum voiding pressure was 17  cm of water.  Residual was 280 mL.  She had a better flow with vaginal packing in place.  EMG activity increased during the voiding phase.  Bladder neck descended 3 cm.  Fluoroscopically she had a moderate cystocele noted.  She did have some trouble voiding in the lab setting.  The cystocele appeared to be larger than it did on pelvic examination.  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    PMH:     Past Medical History:  Diagnosis Date  . Allergy   . 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 Evorn Gong  . H/O cold sores   . Hyperlipidemia   . Migraine with aura   . Osteopenia    prev on fosamax for a few years then started evista at age ~21, T score improved  from -2.29/-1.96 to -1.8/-1.7 as of 06/2011, repeat DXA done 2015    Surgical History:      Past Surgical History:  Procedure Laterality Date  . BREAST BIOPSY Left    benign  . CATARACT EXTRACTION W/ INTRAOCULAR LENS IMPLANT Left 11/2016  . CATARACT EXTRACTION W/ INTRAOCULAR LENS IMPLANT Right 12/22/2016  . cyst on back     2 times  . cyst on eyelid     right eye  . SQUAMOUS CELL CARCINOMA EXCISION     on back/removed 2 times/and thigh  . SQUAMOUS CELL CARCINOMA EXCISION Left 07/15/2016  left lower calf  . SSC removed  1988   Birthmark removal  . TONSILLECTOMY  1950  . TUBAL LIGATION  1980's    Home Medications:       Allergies as of 11/22/2017      Reactions   Alendronate Sodium    heartburn   Codeine    REACTION: NAUSEA   Influenza Vaccines    Local reaction to vaccine in 2014               Medication List            Accurate as of 11/22/17 10:19 AM. Always use your most recent med list.           acetaminophen 325 MG tablet Commonly known as:  TYLENOL Take 650 mg by mouth every 6 (six) hours as needed.   b complex vitamins tablet Take 1 tablet by mouth daily.   B-12 PO Take 1 capsule  by mouth daily.   CALCIUM/VITAMIN D PO Take 1 tablet by mouth daily.   CINNAMON PO Take 1,000 mg by mouth daily.   fexofenadine 180 MG tablet Commonly known as:  ALLEGRA Take 180 mg by mouth daily as needed.   FISH OIL OMEGA-3 PO Take 2 capsules by mouth daily.   FLAXSEED OIL PO Take 1,200 mg by mouth daily.   MAGNESIUM PO Take 1 tablet by mouth daily.   multivitamin tablet Take 1 tablet by mouth daily.   OSTEO BI-FLEX JOINT SHIELD Tabs Take by mouth 2 (two) times daily as needed.   pseudoephedrine-acetaminophen 30-500 MG Tabs tablet Commonly known as:  TYLENOL SINUS Take 1 tablet by mouth every 4 (four) hours as needed.   TURMERIC CURCUMIN PO Take by mouth.   UBIQUINOL PO Take 1 tablet by mouth daily.   valACYclovir 1000 MG tablet Commonly known as:  VALTREX Take 2 tablets (2,000 mg total) by mouth 2 (two) times daily. For one day for cold sore   vitamin C 500 MG tablet Commonly known as:  ASCORBIC ACID Take 1,000 mg by mouth 2 (two) times daily.   VITAMIN D-3 PO Take 2,000 mg by mouth daily.   ZINC PO Take 1 tablet by mouth daily.       Allergies:       Allergies  Allergen Reactions  . Alendronate Sodium     heartburn  . Codeine     REACTION: NAUSEA  . Influenza Vaccines     Local reaction to vaccine in 2014    Family History:      Family History  Problem Relation Age of Onset  . Osteopenia Mother   . Hypertension Father   . Heart disease Father        AFIB, PVD stents LE's, pacer  . Stroke Father   . Cancer Brother        prostate, prostatectomy, radiation 5 years later, Hormone Tx  . Prostate cancer Brother   . Heart disease Brother   . Cancer Brother        prostate, prostatectomy  . Prostate cancer Brother   . Diabetes Other   . Cancer Other        Non-Hodgkin's Lymphoma  . Depression Daughter   . Alcohol abuse Neg Hx   . Drug abuse Neg Hx   . Colon cancer Neg Hx   . Breast  cancer Neg Hx   . Bladder Cancer Neg Hx   . Kidney cancer Neg Hx     Social History:  reports that she quit smoking about 16 years ago. Her smoking use included cigarettes. She has a 18.00 pack-year smoking history. She has never used smokeless tobacco. She reports that she drinks alcohol. She reports that she does not use drugs.  ROS: UROLOGY Frequent Urination?: Yes Hard to postpone urination?: No Burning/pain with urination?: No Get up at night to urinate?: No Leakage of urine?: No Urine stream starts and stops?: No Trouble starting stream?: Yes Do you have to strain to urinate?: No Blood in urine?: No Urinary tract infection?: No Sexually transmitted disease?: No Injury to kidneys or bladder?: No Painful intercourse?: No Weak stream?: No Currently pregnant?: No Vaginal bleeding?: No Last menstrual period?: n  Gastrointestinal Nausea?: No Vomiting?: No Indigestion/heartburn?: No Diarrhea?: No Constipation?: No  Constitutional Fever: No Night sweats?: No Weight loss?: No Fatigue?: No  Skin Skin rash/lesions?: No Itching?: No  Eyes Blurred vision?: No Double vision?: No  Ears/Nose/Throat Sore throat?: No Sinus problems?: No  Hematologic/Lymphatic Swollen glands?: No Easy bruising?: No  Cardiovascular Leg swelling?: No Chest pain?: No  Respiratory Cough?: No Shortness of breath?: No  Endocrine Excessive thirst?: No  Musculoskeletal Back pain?: Yes Joint pain?: No  Neurological Headaches?: No Dizziness?: No  Psychologic Depression?: No Anxiety?: No  Physical Exam: BP 133/68   Pulse 62   Ht 5\' 6"  (1.676 m)   Wt 67.1 kg (148 lb)   BMI 23.89 kg/m   Constitutional:  Alert and oriented, No acute distress.   Laboratory Data: Recent Labs       Lab Results  Component Value Date   WBC 7.3 06/12/2009   HGB 14.6 06/12/2009   HCT 43.3 06/12/2009   MCV 92.7 06/12/2009   PLT 202.0 06/12/2009      Recent  Labs       Lab Results  Component Value Date   CREATININE 0.71 08/04/2017      Recent Labs  No results found for: PSA    Recent Labs  No results found for: TESTOSTERONE    Recent Labs  No results found for: HGBA1C    Urinalysis Labs (Brief)          Component Value Date/Time   APPEARANCEUR Clear 09/13/2017 1008   GLUCOSEU Negative 09/13/2017 1008   BILIRUBINUR Negative 09/13/2017 1008   PROTEINUR Negative 09/13/2017 1008   NITRITE Negative 09/13/2017 1008   LEUKOCYTESUR Trace (A) 09/13/2017 1008      Pertinent Imaging:   Assessment & Plan: Picture was drawn.  Pessary versus watchful waiting first prolapse surgery discussed.  I went over in detail with my usual template a transvaginal vault suspension with cystocele repair and graft recognizing she may not need the vault suspension.  She was also consented for rectocele repair and intraoperative decision would be made.  Mesh issues discussed.  I do not believe there is evidence to recommend a sling.  Did no low or worsening incontinence discussed with severity.  There may have been a mixup but the patient did not see Dr. Garwin Brothers.  She understands why I recommend a simultaneous hysterectomy.  I will call and we will follow-up accordingly.  She is not sexually active and did not wish to have a pessary  After a thorough review of the management options for the patient's condition the patient  elected to proceed with surgical therapy as noted above. We have discussed the potential benefits and risks of the procedure, side effects of the proposed treatment, the likelihood of the patient achieving the goals of  the procedure, and any potential problems that might occur during the procedure or recuperation. Informed consent has been obtained.

## 2018-01-31 MED ORDER — GENTAMICIN SULFATE 40 MG/ML IJ SOLN
5.0000 mg/kg | INTRAVENOUS | Status: AC
  Administered 2018-02-01: 340 mg via INTRAVENOUS
  Filled 2018-01-31 (×2): qty 8.5

## 2018-01-31 NOTE — Anesthesia Preprocedure Evaluation (Signed)
Anesthesia Evaluation  Patient identified by MRN, date of birth, ID band Patient awake    Reviewed: Allergy & Precautions, NPO status , Patient's Chart, lab work & pertinent test results  Airway Mallampati: II  TM Distance: >3 FB Neck ROM: Full    Dental  (+) Dental Advisory Given   Pulmonary neg pulmonary ROS, former smoker,    Pulmonary exam normal breath sounds clear to auscultation       Cardiovascular negative cardio ROS Normal cardiovascular exam Rhythm:Regular Rate:Normal     Neuro/Psych negative neurological ROS  negative psych ROS   GI/Hepatic negative GI ROS, Neg liver ROS,   Endo/Other  negative endocrine ROS  Renal/GU negative Renal ROS     Musculoskeletal negative musculoskeletal ROS (+)   Abdominal   Peds  Hematology negative hematology ROS (+)   Anesthesia Other Findings   Reproductive/Obstetrics negative OB ROS                             Anesthesia Physical Anesthesia Plan  ASA: II  Anesthesia Plan: General   Post-op Pain Management:    Induction: Intravenous  PONV Risk Score and Plan: 4 or greater and Ondansetron, Dexamethasone and Treatment may vary due to age or medical condition  Airway Management Planned: Oral ETT  Additional Equipment:   Intra-op Plan:   Post-operative Plan: Extubation in OR  Informed Consent: I have reviewed the patients History and Physical, chart, labs and discussed the procedure including the risks, benefits and alternatives for the proposed anesthesia with the patient or authorized representative who has indicated his/her understanding and acceptance.   Dental advisory given  Plan Discussed with: CRNA  Anesthesia Plan Comments:         Anesthesia Quick Evaluation

## 2018-02-01 ENCOUNTER — Encounter (HOSPITAL_COMMUNITY): Payer: Self-pay | Admitting: *Deleted

## 2018-02-01 ENCOUNTER — Observation Stay (HOSPITAL_COMMUNITY)
Admission: RE | Admit: 2018-02-01 | Discharge: 2018-02-02 | Disposition: A | Discharge status: Home / Self Care | Payer: Medicare HMO | Source: Ambulatory Visit | Attending: Urology | Admitting: Urology

## 2018-02-01 ENCOUNTER — Other Ambulatory Visit: Payer: Self-pay

## 2018-02-01 ENCOUNTER — Ambulatory Visit (HOSPITAL_COMMUNITY): Payer: Medicare HMO | Admitting: Anesthesiology

## 2018-02-01 ENCOUNTER — Encounter (HOSPITAL_COMMUNITY)
Admission: RE | Disposition: A | Discharge status: Home / Self Care | Payer: Self-pay | Source: Ambulatory Visit | Attending: Urology

## 2018-02-01 DIAGNOSIS — Z807 Family history of other malignant neoplasms of lymphoid, hematopoietic and related tissues: Secondary | ICD-10-CM | POA: Insufficient documentation

## 2018-02-01 DIAGNOSIS — N858 Other specified noninflammatory disorders of uterus: Secondary | ICD-10-CM | POA: Diagnosis not present

## 2018-02-01 DIAGNOSIS — G43109 Migraine with aura, not intractable, without status migrainosus: Secondary | ICD-10-CM | POA: Diagnosis not present

## 2018-02-01 DIAGNOSIS — Z86010 Personal history of colonic polyps: Secondary | ICD-10-CM | POA: Insufficient documentation

## 2018-02-01 DIAGNOSIS — Z8269 Family history of other diseases of the musculoskeletal system and connective tissue: Secondary | ICD-10-CM | POA: Insufficient documentation

## 2018-02-01 DIAGNOSIS — Z87891 Personal history of nicotine dependence: Secondary | ICD-10-CM | POA: Insufficient documentation

## 2018-02-01 DIAGNOSIS — M8580 Other specified disorders of bone density and structure, unspecified site: Secondary | ICD-10-CM | POA: Diagnosis not present

## 2018-02-01 DIAGNOSIS — Z87442 Personal history of urinary calculi: Secondary | ICD-10-CM | POA: Diagnosis not present

## 2018-02-01 DIAGNOSIS — Z8042 Family history of malignant neoplasm of prostate: Secondary | ICD-10-CM | POA: Diagnosis not present

## 2018-02-01 DIAGNOSIS — Z9842 Cataract extraction status, left eye: Secondary | ICD-10-CM | POA: Insufficient documentation

## 2018-02-01 DIAGNOSIS — N8111 Cystocele, midline: Secondary | ICD-10-CM | POA: Diagnosis not present

## 2018-02-01 DIAGNOSIS — Z887 Allergy status to serum and vaccine status: Secondary | ICD-10-CM | POA: Insufficient documentation

## 2018-02-01 DIAGNOSIS — Z833 Family history of diabetes mellitus: Secondary | ICD-10-CM | POA: Diagnosis not present

## 2018-02-01 DIAGNOSIS — Z823 Family history of stroke: Secondary | ICD-10-CM | POA: Insufficient documentation

## 2018-02-01 DIAGNOSIS — Z85828 Personal history of other malignant neoplasm of skin: Secondary | ICD-10-CM | POA: Diagnosis not present

## 2018-02-01 DIAGNOSIS — N814 Uterovaginal prolapse, unspecified: Secondary | ICD-10-CM | POA: Diagnosis not present

## 2018-02-01 DIAGNOSIS — Z888 Allergy status to other drugs, medicaments and biological substances status: Secondary | ICD-10-CM | POA: Diagnosis not present

## 2018-02-01 DIAGNOSIS — Z8249 Family history of ischemic heart disease and other diseases of the circulatory system: Secondary | ICD-10-CM | POA: Insufficient documentation

## 2018-02-01 DIAGNOSIS — R350 Frequency of micturition: Secondary | ICD-10-CM | POA: Insufficient documentation

## 2018-02-01 DIAGNOSIS — Z79899 Other long term (current) drug therapy: Secondary | ICD-10-CM | POA: Insufficient documentation

## 2018-02-01 DIAGNOSIS — N8110 Cystocele, unspecified: Secondary | ICD-10-CM | POA: Diagnosis not present

## 2018-02-01 DIAGNOSIS — E785 Hyperlipidemia, unspecified: Secondary | ICD-10-CM | POA: Diagnosis not present

## 2018-02-01 DIAGNOSIS — Z885 Allergy status to narcotic agent status: Secondary | ICD-10-CM | POA: Diagnosis not present

## 2018-02-01 DIAGNOSIS — Z9841 Cataract extraction status, right eye: Secondary | ICD-10-CM | POA: Insufficient documentation

## 2018-02-01 HISTORY — PX: LAPAROSCOPIC VAGINAL HYSTERECTOMY WITH SALPINGO OOPHORECTOMY: SHX6681

## 2018-02-01 HISTORY — PX: CYSTOCELE REPAIR: SHX163

## 2018-02-01 HISTORY — PX: CYSTOSCOPY: SHX5120

## 2018-02-01 LAB — HEMOGLOBIN AND HEMATOCRIT, BLOOD
HEMATOCRIT: 41.3 % (ref 36.0–46.0)
HEMOGLOBIN: 13.9 g/dL (ref 12.0–15.0)

## 2018-02-01 LAB — TYPE AND SCREEN
ABO/RH(D): O POS
ANTIBODY SCREEN: NEGATIVE

## 2018-02-01 SURGERY — COLPORRHAPHY, ANTERIOR, FOR CYSTOCELE REPAIR
Anesthesia: General

## 2018-02-01 MED ORDER — PROMETHAZINE HCL 25 MG/ML IJ SOLN: 6.2500 mg | INTRAMUSCULAR | Status: DC | PRN

## 2018-02-01 MED ORDER — PHENAZOPYRIDINE HCL 200 MG PO TABS
200.0000 mg | ORAL_TABLET | Freq: Once | ORAL | Status: AC
  Administered 2018-02-01: 200 mg via ORAL
  Filled 2018-02-01: qty 1

## 2018-02-01 MED ORDER — BUPIVACAINE HCL (PF) 0.25 % IJ SOLN
INTRAMUSCULAR | Status: DC | PRN
  Administered 2018-02-01: 20 mL

## 2018-02-01 MED ORDER — ROCURONIUM BROMIDE 10 MG/ML (PF) SYRINGE
PREFILLED_SYRINGE | INTRAVENOUS | Status: DC | PRN
  Administered 2018-02-01: 10 mg via INTRAVENOUS
  Administered 2018-02-01: 40 mg via INTRAVENOUS
  Administered 2018-02-01: 20 mg via INTRAVENOUS

## 2018-02-01 MED ORDER — LIDOCAINE-EPINEPHRINE (PF) 1 %-1:200000 IJ SOLN
INTRAMUSCULAR | Status: AC
  Filled 2018-02-01: qty 60

## 2018-02-01 MED ORDER — SODIUM CHLORIDE 0.9 % IV SOLN
INTRAVENOUS | Status: AC
  Filled 2018-02-01: qty 500000

## 2018-02-01 MED ORDER — BUPIVACAINE HCL (PF) 0.25 % IJ SOLN
INTRAMUSCULAR | Status: AC
  Filled 2018-02-01: qty 30

## 2018-02-01 MED ORDER — FENTANYL CITRATE (PF) 100 MCG/2ML IJ SOLN
INTRAMUSCULAR | Status: AC
  Filled 2018-02-01: qty 2

## 2018-02-01 MED ORDER — ONDANSETRON HCL 4 MG/2ML IJ SOLN
INTRAMUSCULAR | Status: DC | PRN
  Administered 2018-02-01: 4 mg via INTRAVENOUS

## 2018-02-01 MED ORDER — HYDROCODONE-ACETAMINOPHEN 5-325 MG PO TABS
1.0000 | ORAL_TABLET | ORAL | Status: DC | PRN
  Administered 2018-02-01: 2 via ORAL
  Administered 2018-02-01: 1 via ORAL
  Administered 2018-02-02 (×2): 2 via ORAL
  Filled 2018-02-01 (×2): qty 2
  Filled 2018-02-01: qty 1
  Filled 2018-02-01: qty 2

## 2018-02-01 MED ORDER — FENTANYL CITRATE (PF) 100 MCG/2ML IJ SOLN
INTRAMUSCULAR | Status: DC | PRN
  Administered 2018-02-01: 50 ug via INTRAVENOUS
  Administered 2018-02-01: 100 ug via INTRAVENOUS
  Administered 2018-02-01 (×2): 50 ug via INTRAVENOUS

## 2018-02-01 MED ORDER — ACETAMINOPHEN 325 MG PO TABS: 650.0000 mg | ORAL_TABLET | ORAL | Status: DC | PRN

## 2018-02-01 MED ORDER — CLINDAMYCIN PHOSPHATE 2 % VA CREA
TOPICAL_CREAM | VAGINAL | Status: DC | PRN
  Administered 2018-02-01: 1 via VAGINAL

## 2018-02-01 MED ORDER — ONDANSETRON HCL 4 MG/2ML IJ SOLN: 4.0000 mg | INTRAMUSCULAR | Status: DC | PRN

## 2018-02-01 MED ORDER — FENTANYL CITRATE (PF) 100 MCG/2ML IJ SOLN
25.0000 ug | INTRAMUSCULAR | Status: DC | PRN
  Administered 2018-02-01 (×2): 50 ug via INTRAVENOUS

## 2018-02-01 MED ORDER — FENTANYL CITRATE (PF) 250 MCG/5ML IJ SOLN
INTRAMUSCULAR | Status: AC
  Filled 2018-02-01: qty 5

## 2018-02-01 MED ORDER — MORPHINE SULFATE (PF) 2 MG/ML IV SOLN: 2.0000 mg | INTRAVENOUS | Status: DC | PRN

## 2018-02-01 MED ORDER — RINGERS IRRIGATION IR SOLN
Status: DC | PRN
  Administered 2018-02-01: 1

## 2018-02-01 MED ORDER — FLUORESCEIN SODIUM 10 % IV SOLN
INTRAVENOUS | Status: AC
  Filled 2018-02-01: qty 5

## 2018-02-01 MED ORDER — LACTATED RINGERS IV SOLN
INTRAVENOUS | Status: DC
  Administered 2018-02-01 (×3): via INTRAVENOUS

## 2018-02-01 MED ORDER — CEFAZOLIN SODIUM-DEXTROSE 2-4 GM/100ML-% IV SOLN
2.0000 g | INTRAVENOUS | Status: AC
  Administered 2018-02-01: 2 g via INTRAVENOUS
  Filled 2018-02-01: qty 100

## 2018-02-01 MED ORDER — VASOPRESSIN 20 UNIT/ML IV SOLN
INTRAVENOUS | Status: AC
  Filled 2018-02-01: qty 1

## 2018-02-01 MED ORDER — EPHEDRINE SULFATE-NACL 50-0.9 MG/10ML-% IV SOSY
PREFILLED_SYRINGE | INTRAVENOUS | Status: DC | PRN
  Administered 2018-02-01 (×2): 10 mg via INTRAVENOUS

## 2018-02-01 MED ORDER — SUGAMMADEX SODIUM 200 MG/2ML IV SOLN
INTRAVENOUS | Status: DC | PRN
  Administered 2018-02-01: 200 mg via INTRAVENOUS

## 2018-02-01 MED ORDER — MEPERIDINE HCL 50 MG/ML IJ SOLN: 6.2500 mg | INTRAMUSCULAR | Status: DC | PRN

## 2018-02-01 MED ORDER — DIPHENHYDRAMINE HCL 12.5 MG/5ML PO ELIX: 12.5000 mg | ORAL_SOLUTION | Freq: Four times a day (QID) | ORAL | Status: DC | PRN

## 2018-02-01 MED ORDER — DEXAMETHASONE SODIUM PHOSPHATE 10 MG/ML IJ SOLN
INTRAMUSCULAR | Status: DC | PRN
  Administered 2018-02-01: 5 mg via INTRAVENOUS

## 2018-02-01 MED ORDER — LIDOCAINE 2% (20 MG/ML) 5 ML SYRINGE
INTRAMUSCULAR | Status: DC | PRN
  Administered 2018-02-01: 60 mg via INTRAVENOUS

## 2018-02-01 MED ORDER — PROPOFOL 10 MG/ML IV BOLUS
INTRAVENOUS | Status: AC
  Filled 2018-02-01: qty 20

## 2018-02-01 MED ORDER — LIDOCAINE-EPINEPHRINE (PF) 1 %-1:200000 IJ SOLN
INTRAMUSCULAR | Status: DC | PRN
  Administered 2018-02-01: 15 mL

## 2018-02-01 MED ORDER — HYDROCODONE-ACETAMINOPHEN 5-325 MG PO TABS: 1.0000 | ORAL_TABLET | Freq: Four times a day (QID) | ORAL | 0 refills | Status: DC | PRN

## 2018-02-01 MED ORDER — CLINDAMYCIN PHOSPHATE 2 % VA CREA
TOPICAL_CREAM | VAGINAL | Status: AC
  Filled 2018-02-01: qty 80

## 2018-02-01 MED ORDER — DEXTROSE-NACL 5-0.45 % IV SOLN
INTRAVENOUS | Status: DC
  Administered 2018-02-01 – 2018-02-02 (×2): via INTRAVENOUS

## 2018-02-01 MED ORDER — PROPOFOL 10 MG/ML IV BOLUS
INTRAVENOUS | Status: DC | PRN
  Administered 2018-02-01: 100 mg via INTRAVENOUS

## 2018-02-01 MED ORDER — DIPHENHYDRAMINE HCL 50 MG/ML IJ SOLN: 12.5000 mg | Freq: Four times a day (QID) | INTRAMUSCULAR | Status: DC | PRN

## 2018-02-01 SURGICAL SUPPLY — 63 items
BAG URINE DRAINAGE (UROLOGICAL SUPPLIES) ×1 IMPLANT
BLADE SURG 15 STRL LF DISP TIS (BLADE) ×2 IMPLANT
BLADE SURG 15 STRL SS (BLADE) ×2
CATH FOLEY 2WAY SLVR  5CC 14FR (CATHETERS) ×1
CATH FOLEY 2WAY SLVR 5CC 14FR (CATHETERS) ×2 IMPLANT
CLEANER TIP ELECTROSURG 2X2 (MISCELLANEOUS) ×1 IMPLANT
COVER MAYO STAND STRL (DRAPES) IMPLANT
COVER SURGICAL LIGHT HANDLE (MISCELLANEOUS) ×1 IMPLANT
DEVICE CAPIO SLIM SINGLE (INSTRUMENTS) ×2 IMPLANT
DRAIN PENROSE 18X1/4 LTX STRL (WOUND CARE) ×3 IMPLANT
DRAPE SHEET LG 3/4 BI-LAMINATE (DRAPES) ×3 IMPLANT
ELECT PENCIL ROCKER SW 15FT (MISCELLANEOUS) ×4 IMPLANT
GAUZE 4X4 16PLY RFD (DISPOSABLE) ×9 IMPLANT
GAUZE PACKING 2X5 YD STRL (GAUZE/BANDAGES/DRESSINGS) ×1 IMPLANT
GLOVE BIO SURGEON STRL SZ 6.5 (GLOVE) ×6 IMPLANT
GLOVE BIO SURGEON STRL SZ7 (GLOVE) ×4 IMPLANT
GLOVE BIOGEL M STRL SZ7.5 (GLOVE) ×6 IMPLANT
GLOVE ECLIPSE 6.5 STRL STRAW (GLOVE) ×2 IMPLANT
GLOVE ECLIPSE 8.5 STRL (GLOVE) ×3 IMPLANT
GOWN STRL REUS W/TWL XL LVL3 (GOWN DISPOSABLE) ×3 IMPLANT
HOLDER FOLEY CATH W/STRAP (MISCELLANEOUS) ×3 IMPLANT
IRRIG SUCT STRYKERFLOW 2 WTIP (MISCELLANEOUS) ×3
IRRIGATION SUCT STRKRFLW 2 WTP (MISCELLANEOUS) IMPLANT
IV NS 1000ML (IV SOLUTION) ×1
IV NS 1000ML BAXH (IV SOLUTION) ×2 IMPLANT
KIT BASIN OR (CUSTOM PROCEDURE TRAY) ×4 IMPLANT
LIGASURE 7.4 SM JAW OPEN (ELECTROSURGICAL) ×1 IMPLANT
NDL INSUFFLATION 14GA 120MM (NEEDLE) IMPLANT
NDL MAYO 6 CRC TAPER PT (NEEDLE) ×2 IMPLANT
NEEDLE HYPO 22GX1.5 SAFETY (NEEDLE) ×1 IMPLANT
NEEDLE INSUFFLATION 14GA 120MM (NEEDLE) ×3 IMPLANT
NEEDLE MAYO 6 CRC TAPER PT (NEEDLE) IMPLANT
NS IRRIG 1000ML POUR BTL (IV SOLUTION) ×3 IMPLANT
PACK CYSTO (CUSTOM PROCEDURE TRAY) ×3 IMPLANT
PLUG CATH AND CAP STER (CATHETERS) ×3 IMPLANT
RETRACTOR STAY HOOK 5MM (MISCELLANEOUS) ×3 IMPLANT
SHEET LAVH (DRAPES) ×3 IMPLANT
SOLUTION ANTI FOG 6CC (MISCELLANEOUS) ×1 IMPLANT
SUT CAPIO ETHIBPND (SUTURE) IMPLANT
SUT VIC AB 0 CT1 18XCR BRD 8 (SUTURE) IMPLANT
SUT VIC AB 0 CT1 36 (SUTURE) ×5 IMPLANT
SUT VIC AB 0 CT1 8-18 (SUTURE) ×1
SUT VIC AB 2-0 CT1 27 (SUTURE) ×2
SUT VIC AB 2-0 CT1 27XBRD (SUTURE) IMPLANT
SUT VIC AB 2-0 SH 27 (SUTURE) ×4
SUT VIC AB 2-0 SH 27X BRD (SUTURE) IMPLANT
SUT VIC AB 3-0 SH 27 (SUTURE) ×2
SUT VIC AB 3-0 SH 27XBRD (SUTURE) IMPLANT
SUT VIC AB 4-0 PS2 27 (SUTURE) ×2 IMPLANT
SUT VICRYL 0 TIES 12 18 (SUTURE) ×1 IMPLANT
SUT VICRYL 0 UR6 27IN ABS (SUTURE) ×3 IMPLANT
SYR 10ML LL (SYRINGE) ×3 IMPLANT
TOWEL OR 17X26 10 PK STRL BLUE (TOWEL DISPOSABLE) ×4 IMPLANT
TOWEL OR NON WOVEN STRL DISP B (DISPOSABLE) ×3 IMPLANT
TROCAR OPTICAL SLV STD 5MM (TROCAR) ×1 IMPLANT
TROCAR OPTICAL STANDARD 5MM (TROCAR) ×1 IMPLANT
TROCAR XCEL NON-BLD 11X100MML (ENDOMECHANICALS) ×1 IMPLANT
TUBING CONNECTING 10 (TUBING) ×3 IMPLANT
TUBING INSUF HEATED (TUBING) ×1 IMPLANT
TUBING NON-CON 1/4 X 20 CONN (TUBING) ×1 IMPLANT
UNDERPAD 30X30 (UNDERPADS AND DIAPERS) ×1 IMPLANT
WATER STERILE IRR 1000ML POUR (IV SOLUTION) ×3 IMPLANT
YANKAUER SUCT BULB TIP 10FT TU (MISCELLANEOUS) ×3 IMPLANT

## 2018-02-01 NOTE — Op Note (Signed)
Preoperative diagnosis: Cystocele; mild loss of support of vaginal cuff; small rectocele Postoperative diagnosis: Cystocele; mild loss of support of vaginal cuff and small rectocele Surgery: Cystocele repair and cystoscopy Surgeon: Dr. Nicki Reaper Audia Amick Assistant: Debbrah Alar  The assistant was present and necessary for all steps of the operation described. The assistant played a critical role assisting during the operation  Prior to the patient's hysterectomy the patient's legs were positioned to minimize the risk of compartment syndrome and neuropathy and deep vein thrombosis.  She had a moderate grade 2 cystocele.  Uterus was well supported with minimal descensus of about 2 cm.  She had a small distal rectocele that was visible but not very large.  She had a very narrow pubic arch  Following hysterectomy the ureteral sacral ligaments were tagged.  Vaginal cuff was open.  Hemostasis was finalized by gynecology.  The ligaments were well supported.  She obviously did not need a vault suspension.  She had an exceptionally narrow pubic arch and small anatomy making access more challenging.  One could not see beyond 1 or perhaps 2 cm beyond the midline without a retractor in the upper half of the vaginal introitus.  20 cc of a lidocaine epinephrine mixture was utilized.  I performed cystoscopy.  There was no bladder injury.  Urethra was normal.  There was excellent efflux bilaterally of contrast.  I used my Allis clamp technique to make a anterior midline incision.  I sharply mobilized the overlying vaginal epithelium from the underlying pubocervical fascia to the white line bilaterally.  I utilized the tagged ureteral sacral ligaments for landmarks.  It took a long time because of her petite anatomy and vaginal access.  The dissection was also difficult approaching the urethrovesical angle since it was almost as though the urethra was overcorrected.  She had deep fornices bilaterally.  Dr. Garwin Brothers  had identified and called me in to visualize her short anterior vaginal wall and some shortening posteriorly that she tried to preserve with her initial circumferential incision around the cervix  I did a 2 layer anterior repair not imbricating the bladder neck.  I was diligent to keep the length and make sure the repair was anatomic.  I did not shorten the anterior vaginal wall.  The cystoscope the patient.  There is no bladder injury.  Cystoscopically there was a good repair.  Ureters were normal with excellent efflux bilaterally.  Urethra was normal.  I trimmed an appropriate amount of anterior vaginal wall epithelium and closed anterior vaginal wall with running 2-0 Vicryl on a CT1 needle.  I closed the vaginal cuff from right to left and left to right beginning at the lateral edge meeting in the midline with 0 Vicryl.  I reapproximated the ureteral sacral ligaments with 1 of the tag sutures bilaterally.  Following the repair the patient had excellent length and very good support anteriorly.  She still had her small distal rectocele.  The tissues were somewhat mobile and she had a little bit of perineal bulging though it was minimal.  Based upon the pelvic anatomy and the anatomic findings and the examination with her awake I did not feel she needed a rectocele repair.  It was though she had a small tip of one's thumb bubble near the introitus but little to no rectocele otherwise along the entire posterior vaginal wall.  1 vaginal pack was inserted with clindamycin cream.  Leg position was excellent.  Urine output was excellent.  Blood loss was less than 100  mL.  I was pleased with the surgery and hopefully it will reach the patient's treatment goal.

## 2018-02-01 NOTE — Anesthesia Procedure Notes (Signed)
Procedure Name: Intubation °Performed by: Markee Matera J, CRNA °Pre-anesthesia Checklist: Patient identified, Emergency Drugs available, Suction available, Patient being monitored and Timeout performed °Patient Re-evaluated:Patient Re-evaluated prior to induction °Oxygen Delivery Method: Circle system utilized °Preoxygenation: Pre-oxygenation with 100% oxygen °Induction Type: IV induction °Ventilation: Mask ventilation without difficulty °Laryngoscope Size: Mac and 3 °Grade View: Grade I °Tube type: Oral °Tube size: 7.0 mm °Number of attempts: 1 °Airway Equipment and Method: Stylet °Placement Confirmation: ETT inserted through vocal cords under direct vision,  positive ETCO2,  CO2 detector and breath sounds checked- equal and bilateral °Secured at: 21 cm °Tube secured with: Tape °Dental Injury: Teeth and Oropharynx as per pre-operative assessment  ° ° ° ° ° ° °

## 2018-02-01 NOTE — Transfer of Care (Signed)
Immediate Anesthesia Transfer of Care Note  Patient: Desiree Ortega  Procedure(s) Performed: ANTERIOR REPAIR (CYSTOCELE) (N/A ) cystoscopy (N/A ) LAPAROSCOPIC ASSISTED VAGINAL HYSTERECTOMY WITH BILATERAL SALPINGO OOPHORECTOMY (Bilateral )  Patient Location: PACU  Anesthesia Type:General  Level of Consciousness: sedated, patient cooperative and responds to stimulation  Airway & Oxygen Therapy: Patient Spontanous Breathing and Patient connected to face mask oxygen  Post-op Assessment: Report given to RN and Post -op Vital signs reviewed and stable  Post vital signs: Reviewed  Last Vitals:  Vitals Value Taken Time  BP 154/81 02/01/2018 12:39 PM  Temp    Pulse 78 02/01/2018 12:41 PM  Resp 16 02/01/2018 12:41 PM  SpO2 98 % 02/01/2018 12:41 PM  Vitals shown include unvalidated device data.  Last Pain:  Vitals:   02/01/18 0539  TempSrc: Oral         Complications: No apparent anesthesia complications

## 2018-02-01 NOTE — Anesthesia Postprocedure Evaluation (Signed)
Anesthesia Post Note  Patient: Desiree Ortega  Procedure(s) Performed: ANTERIOR REPAIR (CYSTOCELE) (N/A ) cystoscopy (N/A ) LAPAROSCOPIC ASSISTED VAGINAL HYSTERECTOMY WITH BILATERAL SALPINGO OOPHORECTOMY (Bilateral )     Patient location during evaluation: PACU Anesthesia Type: General Level of consciousness: sedated and patient cooperative Pain management: pain level controlled Vital Signs Assessment: post-procedure vital signs reviewed and stable Respiratory status: spontaneous breathing Cardiovascular status: stable Anesthetic complications: no    Last Vitals:  Vitals:   02/01/18 1330 02/01/18 1348  BP: (!) 151/68 (!) 147/76  Pulse: 67 73  Resp: 10 18  Temp: 36.5 C 36.6 C  SpO2: 100% 98%    Last Pain:  Vitals:   02/01/18 1523  TempSrc:   PainSc: [place]

## 2018-02-01 NOTE — H&P (Signed)
Desiree Ortega is an 72 y.o. female G20P4 WF hx TL presents for surgical mgmt of uterine prolapse with cystocele  Pertinent Gynecological History: Menses: post-menopausal Bleeding: none Contraception: none DES exposure: denies Blood transfusions: none Sexually transmitted diseases: no past history Previous GYN Procedures: TL  Last mammogram: normal Date: 08/2017 Last pap: normal Date:09/2017 OB History: G4, P4  Menstrual History: Menarche age: n/a No LMP recorded. Patient is postmenopausal.    Past Medical History:  Diagnosis Date  . Allergy   . 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 Evorn Gong  . Diverticulosis    Moderate  . Female bladder prolapse   . H/O cold sores   . History of colon polyps   . History of kidney stones   . Hyperlipidemia   . Migraine with aura   . Osteopenia    prev on fosamax for a few years then started evista at age ~79, T score improved  from -2.29/-1.96 to -1.8/-1.7 as of 06/2011, repeat DXA done 2015    Past Surgical History:  Procedure Laterality Date  . BREAST BIOPSY Left    benign  . CATARACT EXTRACTION W/ INTRAOCULAR LENS IMPLANT Left 11/2016  . CATARACT EXTRACTION W/ INTRAOCULAR LENS IMPLANT Right 12/22/2016  . COLONOSCOPY  08/15/2013  . cyst on back     2 times  . cyst on eyelid     right eye  . SQUAMOUS CELL CARCINOMA EXCISION     on back/removed 2 times/and thigh  . SQUAMOUS CELL CARCINOMA EXCISION Left 07/15/2016   left lower calf  . SSC removed  1988   Birthmark removal partial 1/3  . TONSILLECTOMY  1950  . TUBAL LIGATION  1980's    Family History  Problem Relation Age of Onset  . Osteopenia Mother   . Hypertension Father   . Heart disease Father        AFIB, PVD stents LE's, pacer  . Stroke Father   . Cancer Brother        prostate, prostatectomy, radiation 5 years later, Hormone Tx  . Prostate cancer Brother   . Heart disease Brother   . Cancer Brother        prostate,  prostatectomy  . Prostate cancer Brother   . Diabetes Other   . Cancer Other        Non-Hodgkin's Lymphoma  . Depression Daughter   . Alcohol abuse Neg Hx   . Drug abuse Neg Hx   . Colon cancer Neg Hx   . Breast cancer Neg Hx   . Bladder Cancer Neg Hx   . Kidney cancer Neg Hx     Social History:  reports that she quit smoking about 16 years ago. Her smoking use included cigarettes. She has a 18.00 pack-year smoking history. She has never used smokeless tobacco. She reports that she drinks alcohol. She reports that she does not use drugs.  Allergies:  Allergies  Allergen Reactions  . Alendronate Sodium Other (See Comments)    heartburn  . Codeine Nausea Only  . Influenza Vaccines     Local reaction to vaccine in 2014    No medications prior to admission.    Review of Systems  All other systems reviewed and are negative.   There were no vitals taken for this visit. Physical Exam  Constitutional: She is oriented to person, place, and time. She appears well-developed and well-nourished.  HENT:  Head: Atraumatic.  Eyes: EOM are normal.  Neck: Neck supple.  Cardiovascular: Regular rhythm.  Respiratory: Breath sounds normal.  Genitourinary:  Genitourinary Comments: Vulva nl Vagina atrophic Cervix nl 1st deg uterine prolapse 2nd cystocele Uterus nl Adnexa no palp mass  Musculoskeletal: Normal range of motion.  Neurological: She is alert and oriented to person, place, and time. She has normal reflexes.    No results found for this or any previous visit (from the past 24 hour(s)).  No results found.  Assessment/Plan: Cystocele Uterovaginal prolapse P) LAVHBSO. Risk of surgery reviewed including infection, bleeding, injury to underlying and surrounding organ structures, internal scar tissue, poss need for blood transfusion and its risk. ALL ? answered Desiree Ortega 02/01/2018, 4:59 AM

## 2018-02-01 NOTE — Progress Notes (Signed)
Looks good Vitals reviewed Labs pending Op findings noted to pt See in am No leg pain or other

## 2018-02-01 NOTE — Brief Op Note (Signed)
02/01/2018    PATIENT:  Desiree Ortega  72 y.o. female  PRE-OPERATIVE DIAGNOSIS:  CYSTOCELE RECTOCELE VAULT PROLAPSE, uterovaginal prolapse  POST-OPERATIVE DIAGNOSIS:  CYSTOCELE RECTOCELE VAULT PROLAPSE, uterovaginal prolapse  PROCEDURE:  LAVHBSO  SURGEON:  Surgeon(s) and Role: Panel 1:    * MacDiarmid, Nicki Reaper, MD - Primary Panel 2:    * Servando Salina, MD - Primary  PHYSICIAN ASSISTANT:   ASSISTANTS: Artelia Laroche CNM   ANESTHESIA:   general Findings: atropic vagina, cystocele, small uterus, atrophic ovaries, surgical separated tubes, nl ureters, nl liver edge. Right lateral mid abdominal wall adhesions EBL:  150 mL   BLOOD ADMINISTERED:none  DRAINS: none   LOCAL MEDICATIONS USED:  MARCAINE     SPECIMEN:  Source of Specimen:  uterus with cervix, tubes and ovaries  DISPOSITION OF SPECIMEN:  PATHOLOGY  COUNTS:  YES  TOURNIQUET:  * No tourniquets in log *  DICTATION: .Other Dictation: Dictation Number [id number]  PLAN OF CARE: Admit for overnight observation  PATIENT DISPOSITION:  PACU - hemodynamically stable.   Delay start of Pharmacological VTE agent (>24hrs) due to surgical blood loss or risk of bleeding: no

## 2018-02-01 NOTE — Interval H&P Note (Signed)
History and Physical Interval Note:  02/01/2018 7:05 AM  Desiree Ortega  has presented today for surgery, with the diagnosis of CYSTOCELE RECTOCELE VAULT PROLAPSE  The various methods of treatment have been discussed with the patient and family. After consideration of risks, benefits and other options for treatment, the patient has consented to  Procedure(s): ANTERIOR (CYSTOCELE) AND POSTERIOR REPAIR (RECTOCELE) (N/A) VAGINAL VAULT SUSPENSION GRAFT AND CYSTOSCOPY (N/A) LAPAROSCOPIC ASSISTED VAGINAL HYSTERECTOMY WITH SALPINGO OOPHORECTOMY (Bilateral) as a surgical intervention .  The patient's history has been reviewed, patient examined, no change in status, stable for surgery.  I have reviewed the patient's chart and labs.  Questions were answered to the patient's satisfaction.     [name]

## 2018-02-01 NOTE — Care Management Obs Status (Signed)
[facility] NOTIFICATION   Patient Details  Name: Desiree Ortega MRN: [id number] Date of Birth: 02/13/1946   Medicare Observation Status Notification Given:  Yes    MahabirJuliann Pulse, RN 02/01/2018, 3:54 PM

## 2018-02-02 ENCOUNTER — Encounter (HOSPITAL_COMMUNITY): Payer: Self-pay | Admitting: Urology

## 2018-02-02 DIAGNOSIS — N8111 Cystocele, midline: Secondary | ICD-10-CM | POA: Diagnosis not present

## 2018-02-02 LAB — BASIC METABOLIC PANEL
ANION GAP: 8 (ref 5–15)
BUN: 14 mg/dL (ref 8–23)
CALCIUM: 8.8 mg/dL — AB (ref 8.9–10.3)
CO2: 25 mmol/L (ref 22–32)
Chloride: 107 mmol/L (ref 98–111)
Creatinine, Ser: 0.61 mg/dL (ref 0.44–1.00)
GFR calc Af Amer: >60 mL/min (ref >60)
GFR calc non Af Amer: >60 mL/min (ref >60)
Glucose, Bld: 128 mg/dL — ABNORMAL HIGH (ref 70–99)
Potassium: 4.2 mmol/L (ref 3.5–5.1)
Sodium: 140 mmol/L (ref 135–145)

## 2018-02-02 LAB — HEMOGLOBIN AND HEMATOCRIT, BLOOD
HCT: 38.2 % (ref 36.0–46.0)
Hemoglobin: 12.9 g/dL (ref 12.0–15.0)

## 2018-02-02 NOTE — Discharge Summary (Signed)
Date of admission: 02/01/2018  Date of discharge: 02/02/2018  Admission diagnosis: cystocele  Discharge diagnosis: cystocele  Secondary diagnoses: uterine prolapse  History and Physical: For full details, please see admission history and physical. Briefly, Desiree Ortega is a 72 y.o. year old patient with the above diagnosis.   Hospital Course: hysterectomy and cystocele repair with excellent post course  Laboratory values:  Recent Labs    02/01/18 1557 02/02/18 0453  HGB 13.9 12.9  HCT 41.3 38.2   Recent Labs    02/02/18 0453  CREATININE 0.61    Disposition: Home  Discharge instruction: The patient was instructed to be ambulatory but told to refrain from heavy lifting, strenuous activity, or driving. Detailed  Discharge medications:  Allergies as of 02/02/2018      Reactions   Alendronate Sodium Other (See Comments)   heartburn   Codeine Nausea Only   Influenza Vaccines    Local reaction to vaccine in 2014      Medication List    STOP taking these medications   B-12 2500 MCG Tabs   CALCIUM 600+D 600-800 MG-UNIT Tabs Generic drug:  Calcium Carb-Cholecalciferol   CINNAMON PO   FISH OIL OMEGA-3 PO   Flaxseed Oil 1200 MG Caps   Melatonin 10 MG Tabs   multivitamin with minerals Tabs tablet   OSTEO BI-FLEX JOINT SHIELD Tabs   Turmeric Curcumin 500 MG Caps   Ubiquinol 100 MG Caps   vitamin C 1000 MG tablet   Vitamin D3 2000 units Tabs   Zinc 50 MG Tabs     TAKE these medications   acetaminophen 500 MG tablet Commonly known as:  TYLENOL Take 500 mg by mouth 2 (two) times daily as needed (for pain.).   fexofenadine 180 MG tablet Commonly known as:  ALLEGRA Take 180 mg by mouth daily. Seasonal allergies from Spring to Fall   HYDROcodone-acetaminophen 5-325 MG tablet Commonly known as:  NORCO/VICODIN Take 1-2 tablets by mouth every 6 (six) hours as needed for moderate pain or severe pain.   LUBRICANT EYE DROPS 0.4-0.3 % Soln Generic drug:   Polyethyl Glycol-Propyl Glycol Place 1 drop into both eyes daily as needed (for dry eyes. (Saline)).   Magnesium 250 MG Tabs Take 250 mg by mouth daily.   valACYclovir 1000 MG tablet Commonly known as:  VALTREX Take 2 tablets (2,000 mg total) by mouth 2 (two) times daily. For one day for cold sore What changed:    when to take this  reasons to take this  additional instructions       Followup:  Follow-up Information    Shelanda Duvall, Nicki Reaper, MD Follow up.   Specialty:  Urology Contact information: [address] [place] [address] [phone]

## 2018-02-02 NOTE — Progress Notes (Signed)
POD #1 s/p LAVHBSO  Feels well. Notes vaginal pressure from vg pack haven't eaten breakfast as yet  O:BP (!) 130/56 (BP Location: Right Arm)   Pulse 67   Temp 98.3 F (36.8 C) (Oral)   Resp 10   Ht 5\' 6"  (1.676 m)   Wt 68.6 kg   SpO2 96%   BMI 24.39 kg/m  Lungs clear to A  Cor RRR Abd soft nondistended active BS  primary dressing d/c/i (umb) Lower site sl ecchymosis Pad none extr no edema  CBC    Component Value Date/Time   WBC 7.4 01/27/2018 1026   RBC 5.07 01/27/2018 1026   HGB 12.9 02/02/2018 0453   HCT 38.2 02/02/2018 0453   PLT 242 01/27/2018 1026   MCV 89.7 01/27/2018 1026   MCH 30.2 01/27/2018 1026   MCHC 33.6 01/27/2018 1026   RDW 12.5 01/27/2018 1026   LYMPHSABS 2.8 06/12/2009 0749   MONOABS 0.6 06/12/2009 0749   EOSABS 0.1 06/12/2009 0749   BASOSABS 0.0 06/12/2009 0749   BMP Latest Ref Rng & Units 02/02/2018 01/27/2018 08/04/2017  Glucose 70 - 99 mg/dL 128(H) 88 89  BUN 8 - 23 mg/dL 14 16 15   Creatinine 0.44 - 1.00 mg/dL 0.61 0.61 0.71  Sodium 135 - 145 mmol/L 140 141 139  Potassium 3.5 - 5.1 mmol/L 4.2 4.1 4.3  Chloride 98 - 111 mmol/L 107 103 105  CO2 22 - 32 mmol/L 25 29 30   Calcium 8.9 - 10.3 mg/dL 8.8(L) 9.2 9.2   IMP: s/p LAVHBSO, ant repair P) anticipate d/c home today after voiding trials. Packing removal per Dr Matilde Sprang F/u 6 weeks D/c instructions reviewed Will have pt pick up samples of estrogen cream to use vaginally 2x/wk( 0.5mg )

## 2018-02-02 NOTE — Progress Notes (Signed)
No pain Labs and vitals good Post op and op detailed Void trial

## 2018-02-02 NOTE — Op Note (Signed)
NAME: Desiree Ortega, Desiree Ortega MEDICAL RECORD [id number] ACCOUNT [id number] DATE OF BIRTH:[birth year] FACILITY: [facility] LOCATION: [facility] PHYSICIAN:Dawnyel Leven A. Anees Vanecek, MD  OPERATIVE REPORT  DATE OF PROCEDURE:  02/01/2018  PREOPERATIVE DIAGNOSIS:  Cystocele, uterovaginal prolapse.  PROCEDURE:  Laparoscopic-assisted vaginal hysterectomy, bilateral salpingo-oophorectomy.  POSTOPERATIVE DIAGNOSIS:  Cystocele, uterovaginal prolapse.  ANESTHESIA:  General.  SURGEON:  Servando Salina, MD  ASSISTANT:  Artelia Laroche, CNM  DESCRIPTION OF PROCEDURE:  Under adequate general anesthesia, the patient was placed in the dorsal lithotomy position.  Examination under anesthesia revealed a small uterus.  No adnexal masses could be appreciated.  Cystocele was present.  A tight introitus was noted.  The patient was sterilely prepped and draped in usual fashion.  Weighted speculum was placed in the vagina.  Sims retractor was placed anteriorly.  The cervix was grasped with a single tooth tenaculum.  The uterus had an acorn  cannula placed.  The bivalve was removed.  Attention was an indwelling Foley catheter was sterilely placed.  Attention was then turned to the abdomen and 0.25% Marcaine was injected along the previous infraumbilical small incision.  A small incision was made and Veress needle was introduced.  Opening pressure of 4 was noted.  Three liters of CO2 was insufflated.  Veress needle was then removed.  The Veress needle had been tested.  A 10 mm disposable trocar with sleeve was introduced into the abdomen without incident.  A lighted video laparoscope was inserted and confirmed entry into the abdomen without incident.  Panoramic inspection revealed a normal liver edge.  The patient was placed in Trendelenburg position.  The uterus was small.  Both ovaries were noted to be atrophic and part of the fallopian tube bilaterally was noted.  The patient has a history of a tubal ligation.  Both ureters were seen  peristalsing and there was diving deep into the pelvis.  Two other right and left lower quadrant incisions were made and under direct visualization, 5 mm ports were placed.  The procedure was started using the Gyrus.  The right IP ligament was grasped, clamped, cauterized and then cut.  This was carried down to the round ligament area which was minimally identifiable on the right.  The round ligament was grasped, cauterized, clamped and cut.  The anterior leaf of the broad ligament was opened at that point and the peritoneum was opened  transversely in the midline anteriorly and extended transversely and used for dissection.  Bladder was displaced inferiorly.  The ureters continued noted to be peristalsing well.  On the opposite side, the same procedure was performed after identifying the fallopian tube distally.  Once the round ligament on the left was clamped, cauterized, and cut, the remaining portion of vesicouterine peritoneum was then dissected.  The uterine vessels noted and they were skeletonized, clamped, cauterized, but not  cut.  Once this was done, attention was then turned back to the vagina.  The ports remained but the instruments were removed.  The abdomen was partially deflated.  I went back to the vagina and the uterine instruments were removed.  A weighted speculum was placed.  Cystocele was noted.  The uterovaginal prolapse was noted anteriorly.  Anterior lip of the cervix was grasped with a single tooth tenaculum and using a dilute solution of 1% lidocaine with epinephrine, the cervicovaginal junction was  injected.  A circumferential incision was made at the junction of the vagina and cervix.  Posteriorly sharp dissection at posterior cul-de-sac was opened and extended.  The vaginal cuff  was oversewn with 0 Vicryl running lock stitch.  The weighted speculum was readjusted into the posterior cul-de-sac.  The uterosacral ligaments were bilaterally clamped, cut and suture ligated with 0 Vicryl  suture.  Anteriorly, the vaginal cuff was undermined anteriorly until the anterior cul-de-sac was opened.  Once this was done, using the Gyrus the cardinal ligaments bilaterally were serially clamped, cauterized, and cut.  The uterine vessels were serially bilaterally clamped, cauterized and cut until all attachments of the uterus and the pelvic side  wall was severed at which time the specimen was removed.  Some bleeding was noted on the right lateral wall in the anterior cul-de-sac bladder area.  Small cauterization was done.  Dr. Lorra Hals was called in preparation for him to proceed with his portion of the case.  Please see his dictation.  While he was preparing for his surgery, I then went back to the abdomen which was reinsufflated by putting a sponge in the vagina.  The pelvis was inspected for bleeders. Bleeding on the right pelvic side wall was cauterized.  The rest of the pelvis was irrigated and suctioned of debris.  Good hemostasis was noted. The instruments were then removed from the lower ports.  The abdomen was deflated.  The infraumbilical port was removed, taking care not to bring up any underlying structure and the skin incisions were closed with 4-0 Vicryl subcuticular closures.  SPECIMEN:  Uterus with cervix, fallopian tubes, and ovaries sent to pathology.  ESTIMATED BLOOD LOSS:  20 mL.  INTRAOPERATIVE FLUIDS: [id number]  COMPLICATIONS:  None.    The patient tolerated the procedure well.  TN/NUANCE  D:02/01/2018 T:02/02/2018 JOB:[id number]

## 2018-02-02 NOTE — Discharge Instructions (Signed)
I have reviewed discharge instructions in detail with the patient. They will follow-up with me or their physician as scheduled. My nurse will also be calling the patients as per protocol. As discussed with Dr. Matilde Sprang.  You may resume aspirin, advil, aleve, vitamins, and supplements 7 days after surgery.

## 2018-02-09 ENCOUNTER — Encounter (HOSPITAL_COMMUNITY): Payer: Self-pay | Admitting: Emergency Medicine

## 2018-02-09 ENCOUNTER — Other Ambulatory Visit: Payer: Self-pay

## 2018-02-09 ENCOUNTER — Emergency Department (HOSPITAL_COMMUNITY)
Admission: EM | Admit: 2018-02-09 | Discharge: 2018-02-10 | Disposition: A | Discharge status: Home / Self Care | Payer: Medicare HMO | Attending: Emergency Medicine | Admitting: Emergency Medicine

## 2018-02-09 DIAGNOSIS — Z87891 Personal history of nicotine dependence: Secondary | ICD-10-CM | POA: Insufficient documentation

## 2018-02-09 DIAGNOSIS — N762 Acute vulvitis: Secondary | ICD-10-CM

## 2018-02-09 DIAGNOSIS — R102 Pelvic and perineal pain: Secondary | ICD-10-CM | POA: Diagnosis present

## 2018-02-09 DIAGNOSIS — Z79899 Other long term (current) drug therapy: Secondary | ICD-10-CM | POA: Insufficient documentation

## 2018-02-09 LAB — URINALYSIS, ROUTINE W REFLEX MICROSCOPIC
BACTERIA UA: NONE SEEN
BILIRUBIN URINE: NEGATIVE
Glucose, UA: NEGATIVE mg/dL
Ketones, ur: NEGATIVE mg/dL
Leukocytes, UA: NEGATIVE
Nitrite: NEGATIVE
PROTEIN: NEGATIVE mg/dL
Specific Gravity, Urine: 1.01 (ref 1.005–1.030)
pH: 7 (ref 5.0–8.0)

## 2018-02-09 LAB — WET PREP, GENITAL
Clue Cells Wet Prep HPF POC: NONE SEEN
SPERM: NONE SEEN
TRICH WET PREP: NONE SEEN
YEAST WET PREP: NONE SEEN

## 2018-02-09 NOTE — ED Triage Notes (Signed)
Patient reports hysterectomy and bladder repair on 8/13. Reports burning around vagina x 1 week worsening today. Called OB and was prescribed cream. Used cream with little relief. Denies bleeding.

## 2018-02-09 NOTE — ED Provider Notes (Signed)
[facility] DEPT Provider Note: Desiree Spurling, MD, FACEP  CSN: [id number] MRN: [id number] ARRIVAL: 02/09/18 at 2120 ROOM: [place]  Post-op Problem   HISTORY OF PRESENT ILLNESS  02/09/18 10:47 PM Desiree Ortega is a 72 y.o. female who underwent cystocele repair and vaginal hysterectomy on August 13.  Since surgery her postoperative lower abdominal pain has gradually improved.  Her laparoscopy incisions are healing well.  She is here with a steadily worsening postoperative pain of the vulva.  She describes it as a burning pain and it is severe at times.  It is worse with urination.  It is better when applying a cool cloth.  She has not noticed discharge.  She does not know if she has had a fever.  She was given Premarin cream to apply twice weekly postoperatively but stopped after 3 doses due to discomfort.  She contacted her OB/GYN who prescribed nystatin and triamcinolone cream and she has used one application.  She has had some occasional lightheadedness and nausea.   She states she has had urinary frequency with voiding of small amounts.   Past Medical History:  Diagnosis Date  . Allergy   . 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 Evorn Gong  . Diverticulosis    Moderate  . Female bladder prolapse   . H/O cold sores   . History of colon polyps   . History of kidney stones   . Hyperlipidemia   . Migraine with aura   . Osteopenia    prev on fosamax for a few years then started evista at age ~8, T score improved  from -2.29/-1.96 to -1.8/-1.7 as of 06/2011, repeat DXA done 2015    Past Surgical History:  Procedure Laterality Date  . BREAST BIOPSY Left    benign  . CATARACT EXTRACTION W/ INTRAOCULAR LENS IMPLANT Left 11/2016  . CATARACT EXTRACTION W/ INTRAOCULAR LENS IMPLANT Right 12/22/2016  . COLONOSCOPY  08/15/2013  . cyst on back     2 times  . cyst on eyelid     right eye  . CYSTOCELE REPAIR N/A  02/01/2018   Procedure: ANTERIOR REPAIR (CYSTOCELE);  Surgeon: Bjorn Loser, MD;  Location: [facility];  Service: Urology;  Laterality: N/A;  . CYSTOSCOPY N/A 02/01/2018   Procedure: cystoscopy;  Surgeon: Bjorn Loser, MD;  Location: [facility];  Service: Urology;  Laterality: N/A;  . LAPAROSCOPIC VAGINAL HYSTERECTOMY WITH SALPINGO OOPHORECTOMY Bilateral 02/01/2018   Procedure: LAPAROSCOPIC ASSISTED VAGINAL HYSTERECTOMY WITH BILATERAL SALPINGO OOPHORECTOMY;  Surgeon: Servando Salina, MD;  Location: [facility];  Service: Gynecology;  Laterality: Bilateral;  . SQUAMOUS CELL CARCINOMA EXCISION     on back/removed 2 times/and thigh  . SQUAMOUS CELL CARCINOMA EXCISION Left 07/15/2016   left lower calf  . SSC removed  1988   Birthmark removal partial 1/3  . TONSILLECTOMY  1950  . TUBAL LIGATION  1980's    Family History  Problem Relation Age of Onset  . Osteopenia Mother   . Hypertension Father   . Heart disease Father        AFIB, PVD stents LE's, pacer  . Stroke Father   . Cancer Brother        prostate, prostatectomy, radiation 5 years later, Hormone Tx  . Prostate cancer Brother   . Heart disease Brother   . Cancer Brother        prostate, prostatectomy  . Prostate cancer Brother   . Diabetes  Other   . Cancer Other        Non-Hodgkin's Lymphoma  . Depression Daughter   . Alcohol abuse Neg Hx   . Drug abuse Neg Hx   . Colon cancer Neg Hx   . Breast cancer Neg Hx   . Bladder Cancer Neg Hx   . Kidney cancer Neg Hx     Social History   Tobacco Use  . Smoking status: Former Smoker    Packs/day: 0.50    Years: 36.00    Pack years: 18.00    Types: Cigarettes    Last attempt to quit: 06/22/2001    Years since quitting: 16.6  . Smokeless tobacco: Never Used  Substance Use Topics  . Alcohol use: Yes    Alcohol/week: 0.0 standard drinks    Comment: occasional  . Drug use: No    Prior to Admission medications   Medication Sig Start Date End Date Taking? Authorizing  Provider  acetaminophen (TYLENOL) 500 MG tablet Take 500 mg by mouth 2 (two) times daily as needed (for pain.).   Yes [provider]  conjugated estrogens (PREMARIN) vaginal cream Place 9.51 Applicatorfuls vaginally 2 (two) times a week.   Yes [provider]  fexofenadine (ALLEGRA) 180 MG tablet Take 180 mg by mouth daily. Seasonal allergies from Spring to Fall   Yes [provider]  HYDROcodone-acetaminophen (NORCO) 5-325 MG tablet Take 1-2 tablets by mouth every 6 (six) hours as needed for moderate pain or severe pain. 02/01/18  Yes Dancy, Estill Bamberg, PA-C  Magnesium 250 MG TABS Take 250 mg by mouth daily.   Yes [provider]  nystatin-triamcinolone ointment Lilyan Gilford)  02/09/18  Yes [provider]  Polyethyl Glycol-Propyl Glycol (LUBRICANT EYE DROPS) 0.4-0.3 % SOLN Place 1 drop into both eyes daily as needed (for dry eyes. (Saline)).   Yes [provider]  valACYclovir (VALTREX) 1000 MG tablet Take 2 tablets (2,000 mg total) by mouth 2 (two) times daily. For one day for cold sore Patient not taking: Reported on 02/09/2018 07/15/15   Tonia Ghent, MD    Allergies Alendronate sodium; Codeine; and Influenza vaccines   REVIEW OF SYSTEMS  Negative except as noted here or in the History of Present Illness.   PHYSICAL EXAMINATION  Initial Vital Signs Blood pressure (!) 171/80, pulse 87, temperature 97.9 F (36.6 C), temperature source Oral, resp. rate (!) 22, SpO2 98 %.  Examination General: Well-developed, well-nourished female in no acute distress; appearance consistent with age of record HENT: normocephalic; atraumatic Eyes: pupils equal, round and reactive to light; extraocular muscles intact Neck: supple Heart: regular rate and rhythm Lungs: clear to auscultation bilaterally Abdomen: soft; nondistended; nontender; no masses or hepatosplenomegaly; bowel sounds present; well-healing laparoscopy incisions GU: Inflammation and  tenderness of the vulva without discharge, bleeding, abscess or necrosis Extremities: No deformity; full range of motion; pulses normal Neurologic: Awake, alert and oriented; motor function intact in all extremities and symmetric; no facial droop Skin: Warm and dry Psychiatric: Normal mood and affect   RESULTS  Summary of this visit's results, reviewed by myself:   EKG Interpretation  Date/Time:    Ventricular Rate:    PR Interval:    QRS Duration:   QT Interval:    QTC Calculation:   R Axis:     Text Interpretation:        Laboratory Studies: Results for orders placed or performed during the hospital encounter of 02/09/18 (from the past 24 hour(s))  Urinalysis, Routine w  reflex microscopic     Status: Abnormal   Collection Time: 02/09/18 11:12 PM  Result Value Ref Range   Color, Urine STRAW (A) YELLOW   APPearance CLEAR CLEAR   Specific Gravity, Urine 1.010 1.005 - 1.030   pH 7.0 5.0 - 8.0   Glucose, UA NEGATIVE NEGATIVE mg/dL   Hgb urine dipstick SMALL (A) NEGATIVE   Bilirubin Urine NEGATIVE NEGATIVE   Ketones, ur NEGATIVE NEGATIVE mg/dL   Protein, ur NEGATIVE NEGATIVE mg/dL   Nitrite NEGATIVE NEGATIVE   Leukocytes, UA NEGATIVE NEGATIVE   RBC / HPF 0-5 0 - 5 RBC/hpf   WBC, UA 0-5 0 - 5 WBC/hpf   Bacteria, UA NONE SEEN NONE SEEN   Squamous Epithelial / LPF 0-5 0 - 5  Wet prep, genital     Status: Abnormal   Collection Time: 02/09/18 11:12 PM  Result Value Ref Range   Yeast Wet Prep HPF POC NONE SEEN NONE SEEN   Trich, Wet Prep NONE SEEN NONE SEEN   Clue Cells Wet Prep HPF POC NONE SEEN NONE SEEN   WBC, Wet Prep HPF POC FEW (A) NONE SEEN   Sperm NONE SEEN    Imaging Studies: No results found.  ED COURSE and MDM  Nursing notes and initial vitals signs, including pulse oximetry, reviewed.  Vitals:   02/09/18 2124 02/09/18 2350 02/09/18 2356  BP: (!) 171/80  (!) 160/74  Pulse: 87  61  Resp: (!) 22  20  Temp: 97.9 F (36.6 C)    TempSrc: Oral    SpO2: 98%   98%  Weight:  68.6 kg   Height:  5\' 6"  (1.676 m)    History and examination consistent with candidal vulvitis.  I see no evidence of postoperative wound infection, her abdomen is soft and nontender.  We will have her continue the nystatin/triamcinolone cream as she has only used 1 dose.  We will give a dose of Diflucan here.  She was advised to contact her OB/GYN if symptoms are not improving in the next 48 hours.  PROCEDURES    ED DIAGNOSES     ICD-10-CM   1. Acute vulvitis N76.2        Kamayah Pillay, Jenny Reichmann, MD 02/10/18 [phone]

## 2018-02-10 DIAGNOSIS — Z87891 Personal history of nicotine dependence: Secondary | ICD-10-CM | POA: Diagnosis not present

## 2018-02-10 DIAGNOSIS — Z79899 Other long term (current) drug therapy: Secondary | ICD-10-CM | POA: Diagnosis not present

## 2018-02-10 DIAGNOSIS — N762 Acute vulvitis: Secondary | ICD-10-CM | POA: Diagnosis not present

## 2018-02-10 MED ORDER — FLUCONAZOLE 150 MG PO TABS
150.0000 mg | ORAL_TABLET | Freq: Once | ORAL | Status: AC
  Administered 2018-02-10: 150 mg via ORAL
  Filled 2018-02-10: qty 1

## 2018-02-14 ENCOUNTER — Encounter: Payer: Self-pay | Admitting: Urology

## 2018-02-14 ENCOUNTER — Ambulatory Visit (INDEPENDENT_AMBULATORY_CARE_PROVIDER_SITE_OTHER): Payer: Medicare HMO | Admitting: Urology

## 2018-02-14 VITALS — BP 128/76 | HR 80 | Ht 66.0 in | Wt 150.3 lb

## 2018-02-14 DIAGNOSIS — N8111 Cystocele, midline: Secondary | ICD-10-CM

## 2018-02-14 NOTE — Progress Notes (Signed)
02/14/2018 10:49 AM   Desiree Ortega [birth year] [id number]  Referring provider: Tonia Ghent, MD [address] [place], [place] [address]  Chief Complaint  Patient presents with  . Routine Post Op    HPI: On August 13 the patient had a hysterectomy.  She had a cystocele for her moderate grade 2 cystocele.  I did not treat the very small distal rectocele.  Her vaginal cuff was well supported.  She did well postoperatively.  Today She was having vaginal burning and went to the emergency department.  She was treated as a yeast infection.  They wondered if it was due to her estrogen cream.   Still using MiraLAX for tendency towards constipation.  Voiding well.  No pain.  Stopped all creams.  Burning gone.  On pelvic examination she had excellent length.  She had exceptionally good support anteriorly.  Once again she had a very tiny distal rectocele.  She had synechiae or light adhesions between the anterior vaginal wall and posterior vaginal wall.  These were lightly broken down with my finger gently well lubricated.  She had excellent length.  There is little bit of bleeding.  Explanation given and pad applied   PMH: Past Medical History:  Diagnosis Date  . Allergy   . 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 Evorn Gong  . Diverticulosis    Moderate  . Female bladder prolapse   . H/O cold sores   . History of colon polyps   . History of kidney stones   . Hyperlipidemia   . Migraine with aura   . Osteopenia    prev on fosamax for a few years then started evista at age ~89, T score improved  from -2.29/-1.96 to -1.8/-1.7 as of 06/2011, repeat DXA done 2015    Surgical History: Past Surgical History:  Procedure Laterality Date  . BREAST BIOPSY Left    benign  . CATARACT EXTRACTION W/ INTRAOCULAR LENS IMPLANT Left 11/2016  . CATARACT EXTRACTION W/ INTRAOCULAR LENS IMPLANT Right 12/22/2016  . COLONOSCOPY  08/15/2013  . cyst  on back     2 times  . cyst on eyelid     right eye  . CYSTOCELE REPAIR N/A 02/01/2018   Procedure: ANTERIOR REPAIR (CYSTOCELE);  Surgeon: Bjorn Loser, MD;  [facility];  Service: Urology;  Laterality: N/A;  . CYSTOSCOPY N/A 02/01/2018   Procedure: cystoscopy;  Surgeon: Bjorn Loser, MD;  [facility];  Service: Urology;  Laterality: N/A;  . LAPAROSCOPIC VAGINAL HYSTERECTOMY WITH SALPINGO OOPHORECTOMY Bilateral 02/01/2018   Procedure: LAPAROSCOPIC ASSISTED VAGINAL HYSTERECTOMY WITH BILATERAL SALPINGO OOPHORECTOMY;  Surgeon: Servando Salina, MD;  Location: [facility];  Service: Gynecology;  Laterality: Bilateral;  . SQUAMOUS CELL CARCINOMA EXCISION     on back/removed 2 times/and thigh  . SQUAMOUS CELL CARCINOMA EXCISION Left 07/15/2016   left lower calf  . SSC removed  1988   Birthmark removal partial 1/3  . TONSILLECTOMY  1950  . TUBAL LIGATION  1980's    Home Medications:  Allergies as of 02/14/2018      Reactions   Alendronate Sodium Other (See Comments)   heartburn   Codeine Nausea Only   Influenza Vaccines    Local reaction to vaccine in 2014      Medication List        Accurate as of 02/14/18 10:49 AM. Always use your most recent med list.  acetaminophen 500 MG tablet Commonly known as:  TYLENOL Take 500 mg by mouth 2 (two) times daily as needed (for pain.).   conjugated estrogens vaginal cream Commonly known as:  PREMARIN Place 9.47 Applicatorfuls vaginally 2 (two) times a week.   fexofenadine 180 MG tablet Commonly known as:  ALLEGRA Take 180 mg by mouth daily. Seasonal allergies from Spring to [place] 0.4-0.3 % Soln Generic drug:  Polyethyl Glycol-Propyl Glycol Place 1 drop into both eyes daily as needed (for dry eyes. (Saline)).   Magnesium 250 MG Tabs Take 250 mg by mouth daily.   nystatin-triamcinolone ointment Commonly known as:  MYCOLOG       Allergies:  Allergies  Allergen Reactions  .  Alendronate Sodium Other (See Comments)    heartburn  . Codeine Nausea Only  . Influenza Vaccines     Local reaction to vaccine in 2014    Family History: Family History  Problem Relation Age of Onset  . Osteopenia Mother   . Hypertension Father   . Heart disease Father        AFIB, PVD stents LE's, pacer  . Stroke Father   . Cancer Brother        prostate, prostatectomy, radiation 5 years later, Hormone Tx  . Prostate cancer Brother   . Heart disease Brother   . Cancer Brother        prostate, prostatectomy  . Prostate cancer Brother   . Diabetes Other   . Cancer Other        Non-Hodgkin's Lymphoma  . Depression Daughter   . Alcohol abuse Neg Hx   . Drug abuse Neg Hx   . Colon cancer Neg Hx   . Breast cancer Neg Hx   . Bladder Cancer Neg Hx   . Kidney cancer Neg Hx     Social History:  reports that she quit smoking about 16 years ago. Her smoking use included cigarettes. She has a 18.00 pack-year smoking history. She has never used smokeless tobacco. She reports that she drinks alcohol. She reports that she does not use drugs.  ROS: UROLOGY Frequent Urination?: No Hard to postpone urination?: No Burning/pain with urination?: No Get up at night to urinate?: No Leakage of urine?: No Urine stream starts and stops?: No Trouble starting stream?: No Do you have to strain to urinate?: No Blood in urine?: No Urinary tract infection?: No Sexually transmitted disease?: No Injury to kidneys or bladder?: No Painful intercourse?: No Weak stream?: No Currently pregnant?: No Vaginal bleeding?: No Last menstrual period?: n  Gastrointestinal Nausea?: No Vomiting?: No Indigestion/heartburn?: No Diarrhea?: No Constipation?: No  Constitutional Fever: No Night sweats?: No Weight loss?: No Fatigue?: No  Skin Skin rash/lesions?: No Itching?: No  Eyes Blurred vision?: No Double vision?: No  Ears/Nose/Throat Sore throat?: No Sinus problems?:  No  Hematologic/Lymphatic Swollen glands?: No Easy bruising?: No  Cardiovascular Leg swelling?: No Chest pain?: No  Respiratory Cough?: No Shortness of breath?: No  Endocrine Excessive thirst?: No  Musculoskeletal Back pain?: No Joint pain?: No  Neurological Headaches?: No Dizziness?: No  Psychologic Depression?: No Anxiety?: No  Physical Exam: BP 128/76 (BP Location: Left Arm, Patient Position: Sitting, Cuff Size: Normal)   Pulse 80   Ht 5\' 6"  (1.676 m)   Wt 150 lb 4.8 oz (68.2 kg)   BMI 24.26 kg/m   Constitutional:  Alert and oriented, No acute distress.   Laboratory Data: Lab Results  Component Value Date  WBC 7.4 01/27/2018   HGB 12.9 02/02/2018   HCT 38.2 02/02/2018   MCV 89.7 01/27/2018   PLT 242 01/27/2018    Lab Results  Component Value Date   CREATININE 0.61 02/02/2018    No results found for: PSA  No results found for: TESTOSTERONE  No results found for: HGBA1C  Urinalysis    Component Value Date/Time   COLORURINE STRAW (A) 02/09/2018 [place] 02/09/2018 2312   APPEARANCEUR Clear 09/13/2017 1008   LABSPEC 1.010 02/09/2018 2312   PHURINE 7.0 02/09/2018 2312   GLUCOSEU NEGATIVE 02/09/2018 2312   HGBUR SMALL (A) 02/09/2018 2312   BILIRUBINUR NEGATIVE 02/09/2018 2312   BILIRUBINUR Negative 09/13/2017 1008   KETONESUR NEGATIVE 02/09/2018 2312   PROTEINUR NEGATIVE 02/09/2018 2312   NITRITE NEGATIVE 02/09/2018 2312   LEUKOCYTESUR NEGATIVE 02/09/2018 2312   LEUKOCYTESUR Trace (A) 09/13/2017 1008    Pertinent Imaging:   Assessment & Plan: Because of the light adhesions I like to reexamine in 2 weeks.  I did not prescribe more cream.  There are no diagnoses linked to this encounter.  No follow-ups on file.  Reece Packer, MD  [facility] [address], [place],  [address] [phone]

## 2018-03-02 DIAGNOSIS — N8111 Cystocele, midline: Secondary | ICD-10-CM | POA: Diagnosis not present

## 2018-03-14 ENCOUNTER — Ambulatory Visit (INDEPENDENT_AMBULATORY_CARE_PROVIDER_SITE_OTHER): Payer: Medicare HMO | Admitting: Urology

## 2018-03-14 ENCOUNTER — Encounter: Payer: Self-pay | Admitting: Urology

## 2018-03-14 VITALS — BP 123/76 | HR 79 | Ht 66.0 in | Wt 150.0 lb

## 2018-03-14 DIAGNOSIS — N8111 Cystocele, midline: Secondary | ICD-10-CM | POA: Diagnosis not present

## 2018-03-14 NOTE — Progress Notes (Signed)
03/14/2018 8:53 AM   Desiree Ortega 1945-08-31 [id number]  Referring provider: Tonia Ghent, MD [address] [place], [place] [address]  Chief Complaint  Patient presents with  . Follow-up    2 wk    HPI: Patient had a vault suspension 6 weeks ago.  2 weeks ago she had recurrent light adhesions that I broke down with a speculum in my finger.  She was here for another check.  She has had improvement in all vaginal symptoms and feels great.  On pelvic examination she had excellent length and excellent support anteriorly and virtually no rectocele.  No adhesions.   PMH: Past Medical History:  Diagnosis Date  . Allergy   . 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 Evorn Gong  . Diverticulosis    Moderate  . Female bladder prolapse   . H/O cold sores   . History of colon polyps   . History of kidney stones   . Hyperlipidemia   . Migraine with aura   . Osteopenia    prev on fosamax for a few years then started evista at age ~21, T score improved  from -2.29/-1.96 to -1.8/-1.7 as of 06/2011, repeat DXA done 2015    Surgical History: Past Surgical History:  Procedure Laterality Date  . BREAST BIOPSY Left    benign  . CATARACT EXTRACTION W/ INTRAOCULAR LENS IMPLANT Left 11/2016  . CATARACT EXTRACTION W/ INTRAOCULAR LENS IMPLANT Right 12/22/2016  . COLONOSCOPY  08/15/2013  . cyst on back     2 times  . cyst on eyelid     right eye  . CYSTOCELE REPAIR N/A 02/01/2018   Procedure: ANTERIOR REPAIR (CYSTOCELE);  Surgeon: Bjorn Loser, MD;  Location: [facility];  Service: Urology;  Laterality: N/A;  . CYSTOSCOPY N/A 02/01/2018   Procedure: cystoscopy;  Surgeon: Bjorn Loser, MD;  [facility]: [facility];  Service: Urology;  Laterality: N/A;  . LAPAROSCOPIC VAGINAL HYSTERECTOMY WITH SALPINGO OOPHORECTOMY Bilateral 02/01/2018   Procedure: LAPAROSCOPIC ASSISTED VAGINAL HYSTERECTOMY WITH BILATERAL SALPINGO OOPHORECTOMY;  Surgeon:  Servando Salina, MD;  Location: [facility];  Service: Gynecology;  Laterality: Bilateral;  . SQUAMOUS CELL CARCINOMA EXCISION     on back/removed 2 times/and thigh  . SQUAMOUS CELL CARCINOMA EXCISION Left 07/15/2016   left lower calf  . SSC removed  1988   Birthmark removal partial 1/3  . TONSILLECTOMY  1950  . TUBAL LIGATION  1980's    Home Medications:  Allergies as of 03/14/2018      Reactions   Alendronate Sodium Other (See Comments)   heartburn   Codeine Nausea Only   Influenza Vaccines    Local reaction to vaccine in 2014      Medication List        Accurate as of 03/14/18  8:53 AM. Always use your most recent med list.          acetaminophen 500 MG tablet Commonly known as:  TYLENOL Take 500 mg by mouth 2 (two) times daily as needed (for pain.).   conjugated estrogens vaginal cream Commonly known as:  PREMARIN Place 1.93 Applicatorfuls vaginally 2 (two) times a week.   fexofenadine 180 MG tablet Commonly known as:  ALLEGRA Take 180 mg by mouth daily. Seasonal allergies from [place] to [place] 0.4-0.3 % Soln Generic drug:  Polyethyl Glycol-Propyl Glycol Place 1 drop into both eyes daily as needed (for dry eyes. (Saline)).  Magnesium 250 MG Tabs Take 250 mg by mouth daily.   nystatin-triamcinolone ointment Commonly known as:  MYCOLOG       Allergies:  Allergies  Allergen Reactions  . Alendronate Sodium Other (See Comments)    heartburn  . Codeine Nausea Only  . Influenza Vaccines     Local reaction to vaccine in 2014    Family History: Family History  Problem Relation Age of Onset  . Osteopenia Mother   . Hypertension Father   . Heart disease Father        AFIB, PVD stents LE's, pacer  . Stroke Father   . Cancer Brother        prostate, prostatectomy, radiation 5 years later, Hormone Tx  . Prostate cancer Brother   . Heart disease Brother   . Cancer Brother        prostate, prostatectomy  . Prostate cancer Brother     . Diabetes Other   . Cancer Other        Non-Hodgkin's Lymphoma  . Depression Daughter   . Alcohol abuse Neg Hx   . Drug abuse Neg Hx   . Colon cancer Neg Hx   . Breast cancer Neg Hx   . Bladder Cancer Neg Hx   . Kidney cancer Neg Hx     Social History:  reports that she quit smoking about 16 years ago. Her smoking use included cigarettes. She has a 18.00 pack-year smoking history. She has never used smokeless tobacco. She reports that she drinks alcohol. She reports that she does not use drugs.  ROS: UROLOGY Frequent Urination?: No Hard to postpone urination?: No Burning/pain with urination?: No Get up at night to urinate?: No Leakage of urine?: No Urine stream starts and stops?: No Trouble starting stream?: No Do you have to strain to urinate?: No Blood in urine?: No Urinary tract infection?: No Sexually transmitted disease?: No Injury to kidneys or bladder?: No Painful intercourse?: No Weak stream?: No Currently pregnant?: No Vaginal bleeding?: No Last menstrual period?: n  Gastrointestinal Nausea?: No Vomiting?: No Indigestion/heartburn?: No Diarrhea?: No Constipation?: No  Constitutional Fever: No Night sweats?: No Weight loss?: No Fatigue?: No  Skin Skin rash/lesions?: No Itching?: No  Eyes Blurred vision?: No Double vision?: No  Ears/Nose/Throat Sore throat?: No Sinus problems?: No  Hematologic/Lymphatic Swollen glands?: No Easy bruising?: No  Cardiovascular Leg swelling?: No Chest pain?: No  Respiratory Cough?: No Shortness of breath?: No  Endocrine Excessive thirst?: No  Musculoskeletal Back pain?: No Joint pain?: No  Neurological Headaches?: No Dizziness?: No  Psychologic Depression?: No Anxiety?: No  Physical Exam: BP 123/76   Pulse 79   Ht 5\' 6"  (1.676 m)   Wt 68 kg   BMI 24.21 kg/m   Constitutional:  Alert and oriented, No acute distress.  Psychiatric: Normal mood and affect.  Laboratory Data: Lab Results   Component Value Date   WBC 7.4 01/27/2018   HGB 12.9 02/02/2018   HCT 38.2 02/02/2018   MCV 89.7 01/27/2018   PLT 242 01/27/2018    Lab Results  Component Value Date   CREATININE 0.61 02/02/2018    No results found for: PSA  No results found for: TESTOSTERONE  No results found for: HGBA1C  Urinalysis    Component Value Date/Time   COLORURINE STRAW (A) 02/09/2018 2312   APPEARANCEUR CLEAR 02/09/2018 2312   APPEARANCEUR Clear 09/13/2017 1008   LABSPEC 1.010 02/09/2018 2312   PHURINE 7.0 02/09/2018 [place] 02/09/2018 2312  HGBUR SMALL (A) 02/09/2018 2312   BILIRUBINUR NEGATIVE 02/09/2018 2312   BILIRUBINUR Negative 09/13/2017 1008   [place] 02/09/2018 2312   PROTEINUR NEGATIVE 02/09/2018 2312   NITRITE NEGATIVE 02/09/2018 2312   LEUKOCYTESUR NEGATIVE 02/09/2018 2312   LEUKOCYTESUR Trace (A) 09/13/2017 1008    Pertinent Imaging:   Assessment & Plan: Patient doing very well I will see her in 6 or 7 weeks  There are no diagnoses linked to this encounter.  No follow-ups on file.  Reece Packer, MD  [facility] [address] [place], [place] [address] [phone]

## 2018-03-15 ENCOUNTER — Other Ambulatory Visit: Payer: Self-pay | Admitting: Obstetrics and Gynecology

## 2018-03-16 DIAGNOSIS — Z09 Encounter for follow-up examination after completed treatment for conditions other than malignant neoplasm: Secondary | ICD-10-CM | POA: Diagnosis not present

## 2018-03-16 DIAGNOSIS — N952 Postmenopausal atrophic vaginitis: Secondary | ICD-10-CM | POA: Diagnosis not present

## 2018-04-25 ENCOUNTER — Ambulatory Visit: Payer: Medicare HMO | Admitting: Urology

## 2018-04-25 ENCOUNTER — Encounter: Payer: Self-pay | Admitting: Urology

## 2018-04-25 VITALS — BP 118/77 | HR 66 | Ht 66.0 in | Wt 154.2 lb

## 2018-04-25 DIAGNOSIS — N8111 Cystocele, midline: Secondary | ICD-10-CM

## 2018-04-25 MED ORDER — MELOXICAM 15 MG PO TABS: 15.0000 mg | ORAL_TABLET | Freq: Every day | ORAL | 1 refills | Status: DC

## 2018-04-25 NOTE — Progress Notes (Signed)
04/25/2018 11:07 AM   Desiree Ortega 03/03/1946 [id number]  Referring provider: Tonia Ghent, MD [address] [place], [place] [address]  Chief Complaint  Patient presents with  . Follow-up    6 weeks    HPI: Patient had a vault suspension 6 weeks ago.  2 weeks ago she had recurrent light adhesions that I broke down with a speculum in my finger.  She was here for another check.  She has had improvement in all vaginal symptoms and feels great.  Today Frequency stable.  Patient voiding well and is continent.  Bowel movements normal.  About 4 weeks ago she started getting right buttock pain just to the right of the anus.  Sometimes it radiates down her right thigh almost to the knee.  She does not get motor symptoms.  As soon as she lays down the pain goes away.  Tylenol does not help a lot and she was not having it 6 weeks after surgery.  On further thought she is thought 3 weeks after surgery she had some right groin pain that subsided with reducing her walking  On pelvic examination she had excellent support and excellent length anteriorly.  She had a small distal asymptomatic rectocele.  With permission did a digital rectal examination that was nontender and no abnormalities.  The patient pointed more to the medial right groin between the introitus and the thigh about an inch and a half above the rectum.  Perhaps the discomfort radiates to the position near the rectum as well but it was difficult to say for certain  Modifying factors: There are no other modifying factors  Associated signs and symptoms: There are no other associated signs and symptoms Aggravating and relieving factors: There are no other aggravating or relieving factors Severity: Moderate Duration: Persistent     PMH: Past Medical History:  Diagnosis Date  . Allergy   . 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 Evorn Gong  . Diverticulosis    Moderate  .  Female bladder prolapse   . H/O cold sores   . History of colon polyps   . History of kidney stones   . Hyperlipidemia   . Migraine with aura   . Osteopenia    prev on fosamax for a few years then started evista at age ~89, T score improved  from -2.29/-1.96 to -1.8/-1.7 as of 06/2011, repeat DXA done 2015    Surgical History: Past Surgical History:  Procedure Laterality Date  . BREAST BIOPSY Left    benign  . CATARACT EXTRACTION W/ INTRAOCULAR LENS IMPLANT Left 11/2016  . CATARACT EXTRACTION W/ INTRAOCULAR LENS IMPLANT Right 12/22/2016  . COLONOSCOPY  08/15/2013  . cyst on back     2 times  . cyst on eyelid     right eye  . CYSTOCELE REPAIR N/A 02/01/2018   Procedure: ANTERIOR REPAIR (CYSTOCELE);  Surgeon: Bjorn Loser, MD;  Location: [facility];  Service: Urology;  Laterality: N/A;  . CYSTOSCOPY N/A 02/01/2018   Procedure: cystoscopy;  Surgeon: Bjorn Loser, MD;  Location: [facility];  Service: Urology;  Laterality: N/A;  . LAPAROSCOPIC VAGINAL HYSTERECTOMY WITH SALPINGO OOPHORECTOMY Bilateral 02/01/2018   Procedure: LAPAROSCOPIC ASSISTED VAGINAL HYSTERECTOMY WITH BILATERAL SALPINGO OOPHORECTOMY;  Surgeon: Servando Salina, MD;  Location: [facility];  Service: Gynecology;  Laterality: Bilateral;  . SQUAMOUS CELL CARCINOMA EXCISION     on back/removed 2 times/and thigh  . SQUAMOUS CELL CARCINOMA EXCISION Left  07/15/2016   left lower calf  . SSC removed  1988   Birthmark removal partial 1/3  . TONSILLECTOMY  1950  . TUBAL LIGATION  1980's    Home Medications:  Allergies as of 04/25/2018      Reactions   Alendronate Sodium Other (See Comments)   heartburn   Codeine Nausea Only   Influenza Vaccines    Local reaction to vaccine in 2014      Medication List        Accurate as of 04/25/18 11:07 AM. Always use your most recent med list.          acetaminophen 500 MG tablet Commonly known as:  TYLENOL Take 500 mg by mouth 2 (two) times daily as needed (for pain.).   b  complex vitamins tablet Take 1 tablet by mouth daily.   B-12 PO Take by mouth.   fexofenadine 180 MG tablet Commonly known as:  ALLEGRA Take 180 mg by mouth daily. Seasonal allergies from Spring to Fall   FISH OIL PO Take by mouth.   FLAX SEEDS PO Take by mouth.   Magnesium 250 MG Tabs Take 250 mg by mouth daily.   VITAMIN D PO Take by mouth.       Allergies:  Allergies  Allergen Reactions  . Alendronate Sodium Other (See Comments)    heartburn  . Codeine Nausea Only  . Influenza Vaccines     Local reaction to vaccine in 2014    Family History: Family History  Problem Relation Age of Onset  . Osteopenia Mother   . Hypertension Father   . Heart disease Father        AFIB, PVD stents LE's, pacer  . Stroke Father   . Cancer Brother        prostate, prostatectomy, radiation 5 years later, Hormone Tx  . Prostate cancer Brother   . Heart disease Brother   . Cancer Brother        prostate, prostatectomy  . Prostate cancer Brother   . Diabetes Other   . Cancer Other        Non-Hodgkin's Lymphoma  . Depression Daughter   . Alcohol abuse Neg Hx   . Drug abuse Neg Hx   . Colon cancer Neg Hx   . Breast cancer Neg Hx   . Bladder Cancer Neg Hx   . Kidney cancer Neg Hx     Social History:  reports that she quit smoking about 16 years ago. Her smoking use included cigarettes. She has a 18.00 pack-year smoking history. She has never used smokeless tobacco. She reports that she drinks alcohol. She reports that she does not use drugs.  ROS: UROLOGY Frequent Urination?: No Hard to postpone urination?: No Burning/pain with urination?: No Get up at night to urinate?: No Leakage of urine?: No Urine stream starts and stops?: No Trouble starting stream?: No Do you have to strain to urinate?: No Blood in urine?: No Urinary tract infection?: No Sexually transmitted disease?: No Injury to kidneys or bladder?: No Painful intercourse?: No Weak stream?: No Currently  pregnant?: No Vaginal bleeding?: No Last menstrual period?: n  Gastrointestinal Nausea?: No Vomiting?: No Indigestion/heartburn?: No Diarrhea?: No Constipation?: No  Constitutional Fever: No Night sweats?: No Weight loss?: No Fatigue?: No  Skin Skin rash/lesions?: No Itching?: No  Eyes Blurred vision?: No Double vision?: No  Ears/Nose/Throat Sore throat?: No Sinus problems?: No  Hematologic/Lymphatic Swollen glands?: No Easy bruising?: No  Cardiovascular Leg swelling?: No Chest pain?:  No  Respiratory Cough?: No Shortness of breath?: No  Endocrine Excessive thirst?: No  Musculoskeletal Back pain?: No Joint pain?: No  Neurological Headaches?: No Dizziness?: No  Psychologic Depression?: No Anxiety?: No  Physical Exam: BP 118/77 (BP Location: Left Arm, Patient Position: Sitting, Cuff Size: Normal)   Pulse 66   Ht 5\' 6"  (1.676 m)   Wt 69.9 kg   BMI 24.89 kg/m   Constitutional:  Alert and oriented, No acute distress.  Laboratory Data: Lab Results  Component Value Date   WBC 7.4 01/27/2018   HGB 12.9 02/02/2018   HCT 38.2 02/02/2018   MCV 89.7 01/27/2018   PLT 242 01/27/2018    Lab Results  Component Value Date   CREATININE 0.61 02/02/2018    No results found for: PSA  No results found for: TESTOSTERONE  No results found for: HGBA1C  Urinalysis    Component Value Date/Time   COLORURINE STRAW (A) 02/09/2018 2312   APPEARANCEUR CLEAR 02/09/2018 2312   APPEARANCEUR Clear 09/13/2017 1008   LABSPEC 1.010 02/09/2018 2312   PHURINE 7.0 02/09/2018 2312   GLUCOSEU NEGATIVE 02/09/2018 2312   HGBUR SMALL (A) 02/09/2018 2312   BILIRUBINUR NEGATIVE 02/09/2018 2312   BILIRUBINUR Negative 09/13/2017 1008   KETONESUR NEGATIVE 02/09/2018 2312   PROTEINUR NEGATIVE 02/09/2018 2312   NITRITE NEGATIVE 02/09/2018 2312   LEUKOCYTESUR NEGATIVE 02/09/2018 2312   LEUKOCYTESUR Trace (A) 09/13/2017 1008    Pertinent Imaging:    Assessment &  Plan: I explained to the patient that she may be having some pelvic floor discomfort or spasm related to the surgery.  Of course her legs were also put in stirrups.  The later presentation is a little bit out of the ordinary versus having the buttock pain immediately after surgery.  The role of meloxicam with side effects and physical therapy discussed.  The role of physical therapy in [place] versus local discussed.  There are no diagnoses linked to this encounter.  No follow-ups on file.  Reece Packer, MD  [facility] [address] [place], [place] [address] [phone]

## 2018-04-28 DIAGNOSIS — Z85828 Personal history of other malignant neoplasm of skin: Secondary | ICD-10-CM | POA: Diagnosis not present

## 2018-04-28 DIAGNOSIS — D225 Melanocytic nevi of trunk: Secondary | ICD-10-CM | POA: Diagnosis not present

## 2018-04-28 DIAGNOSIS — L821 Other seborrheic keratosis: Secondary | ICD-10-CM | POA: Diagnosis not present

## 2018-04-28 DIAGNOSIS — D2261 Melanocytic nevi of right upper limb, including shoulder: Secondary | ICD-10-CM | POA: Diagnosis not present

## 2018-04-28 DIAGNOSIS — Z08 Encounter for follow-up examination after completed treatment for malignant neoplasm: Secondary | ICD-10-CM | POA: Diagnosis not present

## 2018-04-28 DIAGNOSIS — D2262 Melanocytic nevi of left upper limb, including shoulder: Secondary | ICD-10-CM | POA: Diagnosis not present

## 2018-05-02 DIAGNOSIS — R102 Pelvic and perineal pain: Secondary | ICD-10-CM | POA: Diagnosis not present

## 2018-05-02 DIAGNOSIS — M62838 Other muscle spasm: Secondary | ICD-10-CM | POA: Diagnosis not present

## 2018-05-02 DIAGNOSIS — N8111 Cystocele, midline: Secondary | ICD-10-CM | POA: Diagnosis not present

## 2018-05-02 DIAGNOSIS — M6281 Muscle weakness (generalized): Secondary | ICD-10-CM | POA: Diagnosis not present

## 2018-05-09 DIAGNOSIS — R102 Pelvic and perineal pain: Secondary | ICD-10-CM | POA: Diagnosis not present

## 2018-05-09 DIAGNOSIS — M62838 Other muscle spasm: Secondary | ICD-10-CM | POA: Diagnosis not present

## 2018-05-09 DIAGNOSIS — M6281 Muscle weakness (generalized): Secondary | ICD-10-CM | POA: Diagnosis not present

## 2018-05-09 DIAGNOSIS — N8111 Cystocele, midline: Secondary | ICD-10-CM | POA: Diagnosis not present

## 2018-05-16 DIAGNOSIS — N952 Postmenopausal atrophic vaginitis: Secondary | ICD-10-CM | POA: Diagnosis not present

## 2018-05-17 DIAGNOSIS — R102 Pelvic and perineal pain: Secondary | ICD-10-CM | POA: Diagnosis not present

## 2018-05-17 DIAGNOSIS — M6281 Muscle weakness (generalized): Secondary | ICD-10-CM | POA: Diagnosis not present

## 2018-05-17 DIAGNOSIS — N8111 Cystocele, midline: Secondary | ICD-10-CM | POA: Diagnosis not present

## 2018-05-17 DIAGNOSIS — M62838 Other muscle spasm: Secondary | ICD-10-CM | POA: Diagnosis not present

## 2018-05-24 DIAGNOSIS — N8111 Cystocele, midline: Secondary | ICD-10-CM | POA: Diagnosis not present

## 2018-05-24 DIAGNOSIS — M6289 Other specified disorders of muscle: Secondary | ICD-10-CM | POA: Diagnosis not present

## 2018-05-24 DIAGNOSIS — M6281 Muscle weakness (generalized): Secondary | ICD-10-CM | POA: Diagnosis not present

## 2018-05-24 DIAGNOSIS — M62838 Other muscle spasm: Secondary | ICD-10-CM | POA: Diagnosis not present

## 2018-05-24 DIAGNOSIS — R102 Pelvic and perineal pain: Secondary | ICD-10-CM | POA: Diagnosis not present

## 2018-05-24 DIAGNOSIS — R351 Nocturia: Secondary | ICD-10-CM | POA: Diagnosis not present

## 2018-06-01 DIAGNOSIS — M6289 Other specified disorders of muscle: Secondary | ICD-10-CM | POA: Diagnosis not present

## 2018-06-01 DIAGNOSIS — R350 Frequency of micturition: Secondary | ICD-10-CM | POA: Diagnosis not present

## 2018-06-01 DIAGNOSIS — R102 Pelvic and perineal pain: Secondary | ICD-10-CM | POA: Diagnosis not present

## 2018-06-01 DIAGNOSIS — M62838 Other muscle spasm: Secondary | ICD-10-CM | POA: Diagnosis not present

## 2018-06-01 DIAGNOSIS — R351 Nocturia: Secondary | ICD-10-CM | POA: Diagnosis not present

## 2018-06-01 DIAGNOSIS — M6281 Muscle weakness (generalized): Secondary | ICD-10-CM | POA: Diagnosis not present

## 2018-06-06 DIAGNOSIS — R102 Pelvic and perineal pain: Secondary | ICD-10-CM | POA: Diagnosis not present

## 2018-06-06 DIAGNOSIS — R351 Nocturia: Secondary | ICD-10-CM | POA: Diagnosis not present

## 2018-06-06 DIAGNOSIS — M6289 Other specified disorders of muscle: Secondary | ICD-10-CM | POA: Diagnosis not present

## 2018-06-06 DIAGNOSIS — R350 Frequency of micturition: Secondary | ICD-10-CM | POA: Diagnosis not present

## 2018-06-06 DIAGNOSIS — M6281 Muscle weakness (generalized): Secondary | ICD-10-CM | POA: Diagnosis not present

## 2018-06-06 DIAGNOSIS — M62838 Other muscle spasm: Secondary | ICD-10-CM | POA: Diagnosis not present

## 2018-06-20 ENCOUNTER — Encounter: Payer: Self-pay | Admitting: Urology

## 2018-06-20 ENCOUNTER — Ambulatory Visit: Payer: Medicare HMO | Admitting: Urology

## 2018-06-20 VITALS — BP 116/68 | HR 67 | Ht 66.0 in | Wt 158.0 lb

## 2018-06-20 DIAGNOSIS — N8111 Cystocele, midline: Secondary | ICD-10-CM | POA: Diagnosis not present

## 2018-06-20 NOTE — Progress Notes (Signed)
06/20/2018 9:29 AM   Desiree Ortega [birth year] [id number]  Referring provider: Tonia Ghent, MD [address] [place], [place] [address]  Chief Complaint  Patient presents with  . Follow-up    cystocele    HPI: Patient had a vault suspension 6 weeks ago. 2 weeks ago she had recurrent light adhesions that I broke down with a speculum in my finger. She has had improvement in all vaginal symptoms and feels great. Frequency stable.  Patient voiding well and is continent.  Bowel movements normal.  About 4 weeks ago she started getting right buttock pain just to the right of the anus.  Sometimes it radiates down her right thigh almost to the knee.  She does not get motor symptoms.  As soon as she lays down the pain goes away.  Tylenol does not help a lot and she was not having it 6 weeks after surgery.  On further thought she is thought 3 weeks after surgery she had some right groin pain that subsided with reducing her walking  On pelvic examination she had excellent support and excellent length anteriorly.  She had a small distal asymptomatic rectocele.  With permission did a digital rectal examination that was nontender and no abnormalities.  The patient pointed more to the medial right groin between the introitus and the thigh about an inch and a half above the rectum.  Perhaps the discomfort radiates to the position near the rectum as well but it was difficult to say for certain  I explained to the patient that she may be having some pelvic floor discomfort or spasm related to the surgery.  Of course her legs were also put in stirrups.  The later presentation is a little bit out of the ordinary versus having the buttock pain immediately after surgery.  The role of meloxicam with side effects and physical therapy discussed.  The role of physical therapy in [place] versus local discussed.  Today Cystocele repair and vault suspension was performed in August 2019.   Frequency is stable. No infections.  She is continent and very pleased.  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  As long she does her stretching she has almost no discomfort whatsoever and is very pleased    PMH: Past Medical History:  Diagnosis Date  . Allergy   . 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 Evorn Gong  . Diverticulosis    Moderate  . Female bladder prolapse   . H/O cold sores   . History of colon polyps   . History of kidney stones   . Hyperlipidemia   . Migraine with aura   . Osteopenia    prev on fosamax for a few years then started evista at age ~17, T score improved  from -2.29/-1.96 to -1.8/-1.7 as of 06/2011, repeat DXA done 2015    Surgical History: Past Surgical History:  Procedure Laterality Date  . BREAST BIOPSY Left    benign  . CATARACT EXTRACTION W/ INTRAOCULAR LENS IMPLANT Left 11/2016  . CATARACT EXTRACTION W/ INTRAOCULAR LENS IMPLANT Right 12/22/2016  . COLONOSCOPY  08/15/2013  . cyst on back     2 times  . cyst on eyelid     right eye  . CYSTOCELE REPAIR N/A 02/01/2018   Procedure: ANTERIOR REPAIR (CYSTOCELE);  Surgeon: Bjorn Loser, MD;  [facility]: [facility];  Service: Urology;  Laterality: N/A;  . CYSTOSCOPY N/A 02/01/2018   Procedure: cystoscopy;  Surgeon: Matilde Sprang,  Nicki Reaper, MD;  Location: [facility];  Service: Urology;  Laterality: N/A;  . LAPAROSCOPIC VAGINAL HYSTERECTOMY WITH SALPINGO OOPHORECTOMY Bilateral 02/01/2018   Procedure: LAPAROSCOPIC ASSISTED VAGINAL HYSTERECTOMY WITH BILATERAL SALPINGO OOPHORECTOMY;  Surgeon: Servando Salina, MD;  Location: [facility];  Service: Gynecology;  Laterality: Bilateral;  . SQUAMOUS CELL CARCINOMA EXCISION     on back/removed 2 times/and thigh  . SQUAMOUS CELL CARCINOMA EXCISION Left 07/15/2016   left lower calf  . SSC removed  1988   Birthmark removal partial 1/3  . TONSILLECTOMY  1950  . TUBAL LIGATION  1980's    Home Medications:  Allergies as of 06/20/2018        Reactions   Alendronate Sodium Other (See Comments)   heartburn   Codeine Nausea Only   Influenza Vaccines    Local reaction to vaccine in 2014      Medication List       Accurate as of June 20, 2018  9:29 AM. Always use your most recent med list.        acetaminophen 500 MG tablet Commonly known as:  TYLENOL Take 500 mg by mouth 2 (two) times daily as needed (for pain.).   b complex vitamins tablet Take 1 tablet by mouth daily.   B-12 PO Take by mouth.   fexofenadine 180 MG tablet Commonly known as:  ALLEGRA Take 180 mg by mouth daily. Seasonal allergies from Spring to Fall   FISH OIL PO Take by mouth.   FLAX SEEDS PO Take by mouth.   Magnesium 250 MG Tabs Take 250 mg by mouth daily.   meloxicam 15 MG tablet Commonly known as:  MOBIC Take 1 tablet (15 mg total) by mouth daily.   VITAMIN D PO Take by mouth.       Allergies:  Allergies  Allergen Reactions  . Alendronate Sodium Other (See Comments)    heartburn  . Codeine Nausea Only  . Influenza Vaccines     Local reaction to vaccine in 2014    Family History: Family History  Problem Relation Age of Onset  . Osteopenia Mother   . Hypertension Father   . Heart disease Father        AFIB, PVD stents LE's, pacer  . Stroke Father   . Cancer Brother        prostate, prostatectomy, radiation 5 years later, Hormone Tx  . Prostate cancer Brother   . Heart disease Brother   . Cancer Brother        prostate, prostatectomy  . Prostate cancer Brother   . Diabetes Other   . Cancer Other        Non-Hodgkin's Lymphoma  . Depression Daughter   . Alcohol abuse Neg Hx   . Drug abuse Neg Hx   . Colon cancer Neg Hx   . Breast cancer Neg Hx   . Bladder Cancer Neg Hx   . Kidney cancer Neg Hx     Social History:  reports that she quit smoking about 17 years ago. Her smoking use included cigarettes. She has a 18.00 pack-year smoking history. She has never used smokeless tobacco. She reports current  alcohol use. She reports that she does not use drugs.  ROS: UROLOGY Frequent Urination?: No Hard to postpone urination?: No Burning/pain with urination?: No Get up at night to urinate?: No Leakage of urine?: No Urine stream starts and stops?: No Trouble starting stream?: No Do you have to strain to urinate?: No Blood in urine?: No Urinary tract  infection?: No Sexually transmitted disease?: No Injury to kidneys or bladder?: No Painful intercourse?: No Weak stream?: No Currently pregnant?: No Vaginal bleeding?: No Last menstrual period?: n  Gastrointestinal Nausea?: No Vomiting?: No Indigestion/heartburn?: No Diarrhea?: No Constipation?: No  Constitutional Fever: No Night sweats?: No Weight loss?: No Fatigue?: No  Skin Skin rash/lesions?: No Itching?: No  Eyes Blurred vision?: No Double vision?: No  Ears/Nose/Throat Sore throat?: No Sinus problems?: No  Hematologic/Lymphatic Swollen glands?: No Easy bruising?: No  Cardiovascular Leg swelling?: No Chest pain?: No  Respiratory Cough?: No Shortness of breath?: No  Endocrine Excessive thirst?: No  Musculoskeletal Back pain?: No Joint pain?: No  Neurological Headaches?: No Dizziness?: No  Psychologic Depression?: No Anxiety?: No  Physical Exam: BP 116/68 (BP Location: Left Arm, Patient Position: Sitting, Cuff Size: Normal)   Pulse 67   Ht 5\' 6"  (1.676 m)   Wt 71.7 kg   BMI 25.50 kg/m   Constitutional:  Alert and oriented, No acute distress.  Laboratory Data: Lab Results  Component Value Date   WBC 7.4 01/27/2018   HGB 12.9 02/02/2018   HCT 38.2 02/02/2018   MCV 89.7 01/27/2018   PLT 242 01/27/2018    Lab Results  Component Value Date   CREATININE 0.61 02/02/2018    No results found for: PSA  No results found for: TESTOSTERONE  No results found for: HGBA1C  Urinalysis    Component Value Date/Time   COLORURINE STRAW (A) 02/09/2018 2312   APPEARANCEUR CLEAR 02/09/2018  2312   APPEARANCEUR Clear 09/13/2017 1008   LABSPEC 1.010 02/09/2018 2312   PHURINE 7.0 02/09/2018 2312   GLUCOSEU NEGATIVE 02/09/2018 2312   HGBUR SMALL (A) 02/09/2018 2312   BILIRUBINUR NEGATIVE 02/09/2018 2312   BILIRUBINUR Negative 09/13/2017 1008   KETONESUR NEGATIVE 02/09/2018 2312   PROTEINUR NEGATIVE 02/09/2018 2312   NITRITE NEGATIVE 02/09/2018 2312   LEUKOCYTESUR NEGATIVE 02/09/2018 2312   LEUKOCYTESUR Trace (A) 09/13/2017 1008    Pertinent Imaging:   Assessment & Plan: See as needed  There are no diagnoses linked to this encounter.  No follow-ups on file.  Reece Packer, MD  [facility] [address], [place], [place] [address] [phone]

## 2018-06-23 DIAGNOSIS — R350 Frequency of micturition: Secondary | ICD-10-CM | POA: Diagnosis not present

## 2018-06-23 DIAGNOSIS — M62838 Other muscle spasm: Secondary | ICD-10-CM | POA: Diagnosis not present

## 2018-06-23 DIAGNOSIS — M6281 Muscle weakness (generalized): Secondary | ICD-10-CM | POA: Diagnosis not present

## 2018-06-23 DIAGNOSIS — R351 Nocturia: Secondary | ICD-10-CM | POA: Diagnosis not present

## 2018-06-23 DIAGNOSIS — R102 Pelvic and perineal pain: Secondary | ICD-10-CM | POA: Diagnosis not present

## 2018-06-23 DIAGNOSIS — M6289 Other specified disorders of muscle: Secondary | ICD-10-CM | POA: Diagnosis not present

## 2018-07-04 DIAGNOSIS — R69 Illness, unspecified: Secondary | ICD-10-CM | POA: Diagnosis not present

## 2018-07-28 DIAGNOSIS — D485 Neoplasm of uncertain behavior of skin: Secondary | ICD-10-CM | POA: Diagnosis not present

## 2018-07-28 DIAGNOSIS — R208 Other disturbances of skin sensation: Secondary | ICD-10-CM | POA: Diagnosis not present

## 2018-07-28 DIAGNOSIS — D1801 Hemangioma of skin and subcutaneous tissue: Secondary | ICD-10-CM | POA: Diagnosis not present

## 2018-08-12 ENCOUNTER — Ambulatory Visit (INDEPENDENT_AMBULATORY_CARE_PROVIDER_SITE_OTHER): Payer: Medicare HMO

## 2018-08-12 ENCOUNTER — Ambulatory Visit: Payer: Medicare HMO

## 2018-08-12 ENCOUNTER — Other Ambulatory Visit: Payer: Self-pay | Admitting: Family Medicine

## 2018-08-12 VITALS — BP 124/70 | HR 63 | Temp 97.9°F | Ht 66.0 in | Wt 152.8 lb

## 2018-08-12 DIAGNOSIS — Z0000 Encounter for general adult medical examination without abnormal findings: Secondary | ICD-10-CM

## 2018-08-12 DIAGNOSIS — M8580 Other specified disorders of bone density and structure, unspecified site: Secondary | ICD-10-CM

## 2018-08-12 DIAGNOSIS — E785 Hyperlipidemia, unspecified: Secondary | ICD-10-CM | POA: Diagnosis not present

## 2018-08-12 LAB — COMPREHENSIVE METABOLIC PANEL
ALT: 13 U/L (ref 0–35)
AST: 16 U/L (ref 0–37)
Albumin: 4.2 g/dL (ref 3.5–5.2)
Alkaline Phosphatase: 69 U/L (ref 39–117)
BUN: 18 mg/dL (ref 6–23)
CO2: 28 meq/L (ref 19–32)
CREATININE: 0.67 mg/dL (ref 0.40–1.20)
Calcium: 9.2 mg/dL (ref 8.4–10.5)
Chloride: 101 mEq/L (ref 96–112)
GFR: 85.87 mL/min (ref >60.00)
GLUCOSE: 94 mg/dL (ref 70–99)
POTASSIUM: 4.1 meq/L (ref 3.5–5.1)
SODIUM: 139 meq/L (ref 135–145)
Total Bilirubin: 0.7 mg/dL (ref 0.2–1.2)
Total Protein: 6.8 g/dL (ref 6.0–8.3)

## 2018-08-12 LAB — LIPID PANEL
Cholesterol: 249 mg/dL — ABNORMAL HIGH (ref 0–200)
HDL: 64.4 mg/dL (ref >39.00)
LDL CALC: 157 mg/dL — AB (ref 0–99)
NONHDL: 184.74
Total CHOL/HDL Ratio: 4
Triglycerides: 138 mg/dL (ref 0.0–149.0)
VLDL: 27.6 mg/dL (ref 0.0–40.0)

## 2018-08-12 LAB — VITAMIN D 25 HYDROXY (VIT D DEFICIENCY, FRACTURES): VITD: 62.74 ng/mL (ref 30.00–100.00)

## 2018-08-12 NOTE — Progress Notes (Signed)
Subjective:   Desiree Ortega is a 73 y.o. female who presents for Medicare Annual (Subsequent) preventive examination.  Review of Systems:  N/A Cardiac Risk Factors include: advanced age (>31men, >54 women);dyslipidemia     Objective:     Vitals: BP 124/70 (BP Location: Right Arm, Patient Position: Sitting, Cuff Size: Normal)   Pulse 63   Temp 97.9 F (36.6 C) (Oral)   Ht 5\' 6"  (1.676 m) Comment: no shoes  Wt 152 lb 12.8 oz (69.3 kg)   SpO2 98%   BMI 24.66 kg/m   Body mass index is 24.66 kg/m.  Advanced Directives 08/12/2018 02/09/2018 02/01/2018 01/27/2018 08/04/2017 07/16/2016  Does Patient Have a Medical Advance Directive? Yes Yes Yes Yes No No  Type of Advance Directive Living will;Healthcare Power of [place];Living will [place];Living will - -  Does patient want to make changes to medical advance directive? - No - Patient declined No - Patient declined No - Patient declined - -  Copy of [place] in Chart? Yes - validated most recent copy scanned in chart (See row information) Yes Yes Yes - -  Would patient like information on creating a medical advance directive? - - - - No - Patient declined -    Tobacco Social History   Tobacco Use  Smoking Status Former Smoker  . Packs/day: 0.50  . Years: 36.00  . Pack years: 18.00  . Types: Cigarettes  . Last attempt to quit: 06/22/2001  . Years since quitting: 17.1  Smokeless Tobacco Never Used     Counseling given: No   Clinical Intake:  Pre-visit preparation completed: Yes  Pain : 0-10 Pain Score: 4  Pain Type: Chronic pain Pain Location: Buttocks Pain Orientation: Right Pain Onset: More than a month ago Pain Frequency: Intermittent     Nutritional Status: BMI of 19-24  Normal Nutritional Risks: None Diabetes: No  How often do you need to have someone help you when you read instructions, pamphlets, or other  written materials from your doctor or pharmacy?: 1 - Never What is the last grade level you completed in school?: 12th grade  Interpreter Needed?: No  Comments: pt lives with spouse Information entered by :: LPinson, LPN  Past Medical History:  Diagnosis Date  . Allergy   . 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 Evorn Gong  . Diverticulosis    Moderate  . Female bladder prolapse   . H/O cold sores   . History of colon polyps   . History of kidney stones   . Hyperlipidemia   . Migraine with aura   . Osteopenia    prev on fosamax for a few years then started evista at age ~58, T score improved  from -2.29/-1.96 to -1.8/-1.7 as of 06/2011, repeat DXA done 2015   Past Surgical History:  Procedure Laterality Date  . BREAST BIOPSY Left    benign  . CATARACT EXTRACTION W/ INTRAOCULAR LENS IMPLANT Left 11/2016  . CATARACT EXTRACTION W/ INTRAOCULAR LENS IMPLANT Right 12/22/2016  . COLONOSCOPY  08/15/2013  . cyst on back     2 times  . cyst on eyelid     right eye  . CYSTOCELE REPAIR N/A 02/01/2018   Procedure: ANTERIOR REPAIR (CYSTOCELE);  Surgeon: Bjorn Loser, MD;  Location: [facility];  Service: Urology;  Laterality: N/A;  . CYSTOSCOPY N/A 02/01/2018   Procedure: cystoscopy;  Surgeon: Bjorn Loser, MD;  Location: [facility];  Service: Urology;  Laterality: N/A;  . LAPAROSCOPIC VAGINAL HYSTERECTOMY WITH SALPINGO OOPHORECTOMY Bilateral 02/01/2018   Procedure: LAPAROSCOPIC ASSISTED VAGINAL HYSTERECTOMY WITH BILATERAL SALPINGO OOPHORECTOMY;  Surgeon: Servando Salina, MD;  Location: [facility];  Service: Gynecology;  Laterality: Bilateral;  . SQUAMOUS CELL CARCINOMA EXCISION     on back/removed 2 times/and thigh  . SQUAMOUS CELL CARCINOMA EXCISION Left 07/15/2016   left lower calf  . SSC removed  1988   Birthmark removal partial 1/3  . TONSILLECTOMY  1950  . TUBAL LIGATION  1980's   Family History  Problem Relation Age of Onset  .  Osteopenia Mother   . Hypertension Father   . Heart disease Father        AFIB, PVD stents LE's, pacer  . Stroke Father   . Cancer Brother        prostate, prostatectomy, radiation 5 years later, Hormone Tx  . Prostate cancer Brother   . Heart disease Brother   . Cancer Brother        prostate, prostatectomy  . Prostate cancer Brother   . Diabetes Other   . Cancer Other        Non-Hodgkin's Lymphoma  . Depression Daughter   . Alcohol abuse Neg Hx   . Drug abuse Neg Hx   . Colon cancer Neg Hx   . Breast cancer Neg Hx   . Bladder Cancer Neg Hx   . Kidney cancer Neg Hx    Social History   Socioeconomic History  . Marital status: Married    Spouse name: Not on file  . Number of children: 4  . Years of education: Not on file  . Highest education level: Not on file  Occupational History  . 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 Financial resource strain: Not on file  . Food insecurity:    Worry: Not on file    Inability: Not on file  . Transportation needs:    Medical: Not on file    Non-medical: Not on file  Tobacco Use  . Smoking status: Former Smoker    Packs/day: 0.50    Years: 36.00    Pack years: 18.00    Types: Cigarettes    Last attempt to quit: 06/22/2001    Years since quitting: 17.1  . Smokeless tobacco: Never Used  Substance and Sexual Activity  . Alcohol use: Yes    Alcohol/week: 2.0 standard drinks    Types: 2 Glasses of wine per week  . Drug use: No  . Sexual activity: Yes    Birth control/protection: Post-menopausal  Lifestyle  . Physical activity:    Days per week: Not on file    Minutes per session: Not on file  . Stress: Not on file  Relationships  . Social connections:    Talks on phone: Not on file    Gets together: Not on file    Attends religious service: Not on file    Active member of club or organization: Not on file    Attends meetings of clubs or organizations: Not on file     Relationship status: Not on file  Other Topics Concern  . Not on file  Social History Narrative   Married 1963, lives with husband   Cubs and [employer] fan   Mother in ALF as of 2019 (pt's mother was born in [place])   4 daughters  Taught swimming prev    Outpatient Encounter Medications as of 08/12/2018  Medication Sig  . acetaminophen (TYLENOL) 500 MG tablet Take 500 mg by mouth 2 (two) times daily as needed (for pain.).  Marland Kitchen b complex vitamins tablet Take 1 tablet by mouth daily.  . Cholecalciferol (VITAMIN D PO) Take by mouth.  . Cyanocobalamin (B-12 PO) Take by mouth.  . fexofenadine (ALLEGRA) 180 MG tablet Take 180 mg by mouth as needed. Seasonal allergies from Spring to Fall  . Flaxseed, Linseed, (FLAX SEEDS PO) Take by mouth.  . Magnesium 250 MG TABS Take 250 mg by mouth daily.  . Omega-3 Fatty Acids (FISH OIL PO) Take by mouth.  . [DISCONTINUED] meloxicam (MOBIC) 15 MG tablet Take 1 tablet (15 mg total) by mouth daily.   No facility-administered encounter medications on file as of 08/12/2018.     Activities of Daily Living In your present state of health, do you have any difficulty performing the following activities: 08/12/2018 02/01/2018  Hearing? N N  Vision? N N  Difficulty concentrating or making decisions? N N  Walking or climbing stairs? N N  Dressing or bathing? N N  Doing errands, shopping? N N  Preparing Food and eating ? N -  Using the Toilet? N -  In the past six months, have you accidently leaked urine? N -  Do you have problems with loss of bowel control? N -  Managing your Medications? N -  Managing your Finances? N -  Housekeeping or managing your Housekeeping? N -  Some recent data might be hidden    Patient Care Team: Tonia Ghent, MD as PCP - General (Family Medicine) Bryson Ha, OD as Consulting Physician (Optometry) Dasher, Rayvon Char, MD as Consulting Physician (Dermatology) Altha Harm, DDS as Consulting Physician (Dentistry)    Assessment:    This is a routine wellness examination for Pearson.   Hearing Screening   125Hz  250Hz  500Hz  1000Hz  2000Hz  3000Hz  4000Hz  6000Hz  8000Hz   Right ear:   40 40 40  40    Left ear:   40 40 40  40    Vision Screening Comments: Vision exam in 2019 with Dr. Kerin Ransom   Exercise Activities and Dietary recommendations Current Exercise Habits: Home exercise routine, Type of exercise: walking;yoga;Other - see comments;strength training/weights(water exercises), Time (Minutes): 60, Frequency (Times/Week): 6, Weekly Exercise (Minutes/Week): 360, Intensity: Moderate, Exercise limited by: None identified  Goals    . Increase physical activity     Starting 08/12/2018, I will continue to exercise for at least 60 minutes 6 days per week.        Fall Risk Fall Risk  08/12/2018 08/04/2017 07/16/2016 07/15/2015 07/12/2014  Falls in the past year? 0 No No No No   Depression Screen PHQ 2/9 Scores 08/12/2018 08/04/2017 07/16/2016 07/15/2015  PHQ - 2 Score 0 0 0 0  PHQ- 9 Score 0 0 - -     Cognitive Function MMSE - Mini Mental State Exam 08/12/2018 08/04/2017 07/16/2016  Orientation to time 5 5 5   Orientation to Place 5 5 5   Registration 3 3 3   Attention/ Calculation 0 0 0  Recall 3 2 3   Recall-comments - unable to recall 1 of 3 words -  Language- name 2 objects 0 0 0  Language- repeat 1 1 1   Language- follow 3 step command 3 3 3   Language- read & follow direction 0 0 0  Write a sentence 0 0 0  Copy design 0 0 0  Total score 20 19 20      PLEASE NOTE: A Mini-Cog screen was completed. Maximum score is 20. A value of 0 denotes this part of [place] MMSE was not completed or the patient failed this part of the Mini-Cog screening.   Mini-Cog Screening Orientation to Time - Max 5 pts Orientation to Place - Max 5 pts Registration - Max 3 pts Recall - Max 3 pts Language Repeat - Max 1 pts Language Follow 3 Step Command - Max 3 pts     Immunization History  Administered Date(s) Administered  . Influenza,  Seasonal, Injecte, Preservative Fre 07/01/2012  . Pneumococcal Conjugate-13 07/15/2015  . Pneumococcal Polysaccharide-23 06/25/2011  . Td 11/20/1997, 06/11/2008  . Tdap 04/29/2015   Screening Tests Health Maintenance  Topic Date Due  . COLONOSCOPY  08/15/2018  . MAMMOGRAM  09/01/2019  . TETANUS/TDAP  04/28/2025  . DEXA SCAN  Completed  . Hepatitis C Screening  Completed  . PNA vac Low Risk Adult  Completed      Plan:     I have personally reviewed, addressed, and noted the following in the patient's chart:  A. Medical and social history B. Use of alcohol, tobacco or illicit drugs  C. Current medications and supplements D. Functional ability and status E.  Nutritional status F.  Physical activity G. Advance directives H. List of other physicians I.  Hospitalizations, surgeries, and ER visits in previous 12 months J.  [place] to include hearing, vision, cognitive, depression L. Referrals and appointments - none  In addition, I have reviewed and discussed with patient certain preventive protocols, quality metrics, and best practice recommendations. A written personalized care plan for preventive services as well as general preventive health recommendations were provided to patient.  See attached scanned questionnaire for additional information.   Signed,   Lindell Noe, MHA, BS, LPN Health Coach

## 2018-08-12 NOTE — Progress Notes (Signed)
PCP notes:   Health maintenance:  No gaps identified. Discussed colonoscopy. Patient desires to postpone screening at this time.   Abnormal screenings:   None  Patient concerns:   Patient is grieving recent loss of brother. Planning to fly to [facility] for funeral.  Patient is pending surgery for squamous cell carcinoma on 08/19/18.  Nurse concerns:  None  Next PCP appt:   08/18/18 @ 1515  I reviewed health advisor's note, was available for consultation on the day of service listed in this note, and agree with documentation and plan. Elsie Stain, MD.

## 2018-08-12 NOTE — Patient Instructions (Signed)
Desiree Ortega , Thank you for taking time to come for your Medicare Wellness Visit. I appreciate your ongoing commitment to your health goals. Please review the following plan we discussed and let me know if I can assist you in the future.   These are the goals we discussed: Goals    . Increase physical activity     Starting 08/12/2018, I will continue to exercise for at least 60 minutes 6 days per week.        This is a list of the screening recommended for you and due dates:  Health Maintenance  Topic Date Due  . Colon Cancer Screening  08/15/2018  . Mammogram  09/01/2019  . Tetanus Vaccine  04/28/2025  . DEXA scan (bone density measurement)  Completed  .  Hepatitis C: One time screening is recommended by Center for Disease Control  (CDC) for  adults born from [birth year] through 1967.   Completed  . Pneumonia vaccines  Completed   Preventive Care for Adults  A healthy lifestyle and preventive care can promote health and wellness. Preventive health guidelines for adults include the following key practices.  . A routine yearly physical is a good way to check with your health care provider about your health and preventive screening. It is a chance to share any concerns and updates on your health and to receive a thorough exam.  . Visit your dentist for a routine exam and preventive care every 6 months. Brush your teeth twice a day and floss once a day. Good oral hygiene prevents tooth decay and gum disease.  . The frequency of eye exams is based on your age, health, family medical history, use  of contact lenses, and other factors. Follow your health care provider's recommendations for frequency of eye exams.  . Eat a healthy diet. Foods like vegetables, fruits, whole grains, low-fat dairy products, and lean protein foods contain the nutrients you need without too many calories. Decrease your intake of foods high in solid fats, added sugars, and salt. Eat the right amount of calories for you.  Get information about a proper diet from your health care provider, if necessary.  . Regular physical exercise is one of the most important things you can do for your health. Most adults should get at least 150 minutes of moderate-intensity exercise (any activity that increases your heart rate and causes you to sweat) each week. In addition, most adults need muscle-strengthening exercises on 2 or more days a week.  Silver Sneakers may be a benefit available to you. To determine eligibility, you may visit the website: www.silversneakers.com or contact program at 581-461-6371 Mon-Fri between 8AM-8PM.   . Maintain a healthy weight. The body mass index (BMI) is a screening tool to identify possible weight problems. It provides an estimate of body fat based on height and weight. Your health care provider can find your BMI and can help you achieve or maintain a healthy weight.   For adults 20 years and older: ? A BMI below 18.5 is considered underweight. ? A BMI of 18.5 to 24.9 is normal. ? A BMI of 25 to 29.9 is considered overweight. ? A BMI of 30 and above is considered obese.   . Maintain normal blood lipids and cholesterol levels by exercising and minimizing your intake of saturated fat. Eat a balanced diet with plenty of fruit and vegetables. Blood tests for lipids and cholesterol should begin at age 45 and be repeated every 5 years. If your lipid or  cholesterol levels are high, you are over 50, or you are at high risk for heart disease, you may need your cholesterol levels checked more frequently. Ongoing high lipid and cholesterol levels should be treated with medicines if diet and exercise are not working.  . If you smoke, find out from your health care provider how to quit. If you do not use tobacco, please do not start.  . If you choose to drink alcohol, please do not consume more than 2 drinks per day. One drink is considered to be 12 ounces (355 mL) of beer, 5 ounces (148 mL) of wine, or  1.5 ounces (44 mL) of liquor.  . If you are 67-77 years old, ask your health care provider if you should take aspirin to prevent strokes.  . Use sunscreen. Apply sunscreen liberally and repeatedly throughout the day. You should seek shade when your shadow is shorter than you. Protect yourself by wearing long sleeves, pants, a wide-brimmed hat, and sunglasses year round, whenever you are outdoors.  . Once a month, do a whole body skin exam, using a mirror to look at the skin on your back. Tell your health care provider of new moles, moles that have irregular borders, moles that are larger than a pencil eraser, or moles that have changed in shape or color.

## 2018-08-16 ENCOUNTER — Ambulatory Visit: Payer: Medicare HMO

## 2018-08-16 ENCOUNTER — Encounter: Payer: Medicare HMO | Admitting: Family Medicine

## 2018-08-18 ENCOUNTER — Encounter: Payer: Self-pay | Admitting: Family Medicine

## 2018-08-18 ENCOUNTER — Ambulatory Visit (INDEPENDENT_AMBULATORY_CARE_PROVIDER_SITE_OTHER): Payer: Medicare HMO | Admitting: Family Medicine

## 2018-08-18 VITALS — BP 120/58 | HR 72 | Temp 98.2°F | Ht 66.0 in | Wt 155.4 lb

## 2018-08-18 DIAGNOSIS — E785 Hyperlipidemia, unspecified: Secondary | ICD-10-CM

## 2018-08-18 DIAGNOSIS — Z7189 Other specified counseling: Secondary | ICD-10-CM

## 2018-08-18 DIAGNOSIS — Z0000 Encounter for general adult medical examination without abnormal findings: Secondary | ICD-10-CM

## 2018-08-18 NOTE — Patient Instructions (Signed)
Don't change your meds for now.  Thanks for your effort.  Take care.  Glad to see you.  Update me as needed.

## 2018-08-18 NOTE — Progress Notes (Signed)
She has skin surgery pending for tomorrow.    She had seen urology about options, with hysterectomy.  She went to PT about pelvic floor muscle therapy.   Mammogram 2019 DXA d/w pt.  Defer for now given her hx.  She wanted to defer.  She had flu reaction prev.  D/w pt.  Reasonable to defer.   Other vaccines d/w pt.  shingrix out of stock, d/w pt.  Colonoscopy d/w pt.  Would be due this year, but she wanted to defer this year, at least at this point given recent procedures.    Pap not due.   Living will d/w pt. Husband would be designated if she were incapacitated.  Diet and exercise d/w pt.    HLD.  ASCVD score d/w pt.  13%, 11% with statin.  This is not a dramatic change and it would make sense for her to continue work on diet and exercise as she is not enthused about starting a statin.  D/w pt.   Her brother recently died and offered condolences.  PMH and SH reviewed  ROS: Per HPI unless specifically indicated in ROS section   Meds, vitals, and allergies reviewed.   GEN: nad, alert and oriented HEENT: mucous membranes moist NECK: supple w/o LA CV: rrr.  no murmur PULM: ctab, no inc wob ABD: soft, +bs EXT: no edema SKIN: no acute rash

## 2018-08-19 DIAGNOSIS — D485 Neoplasm of uncertain behavior of skin: Secondary | ICD-10-CM | POA: Diagnosis not present

## 2018-08-19 DIAGNOSIS — C44529 Squamous cell carcinoma of skin of other part of trunk: Secondary | ICD-10-CM | POA: Diagnosis not present

## 2018-08-20 ENCOUNTER — Encounter: Payer: Self-pay | Admitting: Internal Medicine

## 2018-08-21 MED ORDER — OSTEO BI-FLEX ONE PER DAY PO TABS: 1.0000 | ORAL_TABLET | Freq: Every day | ORAL | Status: DC | PRN

## 2018-08-21 MED ORDER — UBIQUINOL 100 MG PO CAPS: 100.0000 mg | ORAL_CAPSULE | Freq: Every day | ORAL | Status: AC

## 2018-08-21 MED ORDER — FEXOFENADINE HCL 180 MG PO TABS: 180.0000 mg | ORAL_TABLET | ORAL | Status: AC | PRN

## 2018-08-21 MED ORDER — VITAMIN C 1000 MG PO TABS: 1000.0000 mg | ORAL_TABLET | Freq: Every day | ORAL | Status: DC

## 2018-08-21 MED ORDER — MELATONIN 3 MG PO TABS: 1.0000 | ORAL_TABLET | Freq: Every evening | ORAL | Status: DC | PRN

## 2018-08-21 MED ORDER — MULTIVITAMINS PO CAPS: 1.0000 | ORAL_CAPSULE | Freq: Every day | ORAL | Status: AC

## 2018-08-21 NOTE — Assessment & Plan Note (Signed)
Living will d/w pt. Husband would be designated if she were incapacitated.

## 2018-08-21 NOTE — Assessment & Plan Note (Signed)
Mammogram 2019 DXA d/w pt.  Defer for now given her hx.  She wanted to defer.  She had flu reaction prev.  D/w pt.  Reasonable to defer.   Other vaccines d/w pt.  shingrix out of stock, d/w pt.  Colonoscopy d/w pt.  Would be due this year, but she wanted to defer this year, at least at this point given recent procedures.    Pap not due.   Living will d/w pt. Husband would be designated if she were incapacitated.  Diet and exercise d/w pt.

## 2018-08-21 NOTE — Assessment & Plan Note (Signed)
ASCVD score d/w pt.  13%, 11% with statin.  This is not a dramatic change and it would make sense for her to continue work on diet and exercise as she is not enthused about starting a statin.  D/w pt.

## 2018-08-31 DIAGNOSIS — R69 Illness, unspecified: Secondary | ICD-10-CM | POA: Diagnosis not present

## 2018-09-09 ENCOUNTER — Encounter: Payer: Self-pay | Admitting: Family Medicine

## 2018-09-09 ENCOUNTER — Ambulatory Visit (INDEPENDENT_AMBULATORY_CARE_PROVIDER_SITE_OTHER): Payer: Medicare HMO | Admitting: Family Medicine

## 2018-09-09 ENCOUNTER — Other Ambulatory Visit: Payer: Self-pay

## 2018-09-09 DIAGNOSIS — H00019 Hordeolum externum unspecified eye, unspecified eyelid: Secondary | ICD-10-CM | POA: Diagnosis not present

## 2018-09-09 MED ORDER — POLYMYXIN B-TRIMETHOPRIM 10000-0.1 UNIT/ML-% OP SOLN: 1.0000 [drp] | Freq: Four times a day (QID) | OPHTHALMIC | 0 refills | Status: DC

## 2018-09-09 NOTE — Progress Notes (Signed)
Initially with some mild L upper orbit tenderness, then that resolved, now with lower eyelid lesion.  L lower eyelid lesion noted in the last week.  Tender locally.  No acute vision changes.    She had been working in the yard, at one point she felt like she had walked into a spiderweb and brushed it off her face.  No eye trauma otherwise.  No R sx.  No double vision.  No eye pain now but had some discomfort prev.  Lower eyelid is still sore and puffy but upper eyelid is not.    Some occ L eye discharge in the AM.  Has been using warm compresses.  Had been using systane eye drops at baseline.    Meds, vitals, and allergies reviewed.   ROS: Per HPI unless specifically indicated in ROS section   GEN: nad, alert and oriented HEENT: mucous membranes moist, tympanic membranes within normal limits bilaterally.  Fundus exam limited because she is not dilated but visible area appears normal.  Conjunctiva within normal limits bilaterally. PERRL EOMI. eyelids all normal except for left lower eyelid.  She has some mild irritation on the lower eyelid that is not tender.  She does have a stye noted in the midline of the left lower eyelid. OP wnl NECK: supple w/o LA

## 2018-09-09 NOTE — Patient Instructions (Signed)
Use polytrim and warm compresses.   Take care.  Glad to see you.  Update me as needed.

## 2018-09-11 DIAGNOSIS — H00019 Hordeolum externum unspecified eye, unspecified eyelid: Secondary | ICD-10-CM | POA: Insufficient documentation

## 2018-09-11 NOTE — Assessment & Plan Note (Signed)
It looks like she has a stye with some left lower eyelid irritation.  There is no fluctuant mass and it does not appear to be an orbital cellulitis.  The area is not tender.  Given the irritation, I would use polytrim and continue with warm compresses.  I expect this to resolve.  Update me as needed.  She agrees.

## 2018-11-04 ENCOUNTER — Encounter: Payer: Self-pay | Admitting: Family Medicine

## 2019-01-30 DIAGNOSIS — R69 Illness, unspecified: Secondary | ICD-10-CM | POA: Diagnosis not present

## 2019-02-01 ENCOUNTER — Other Ambulatory Visit: Payer: Self-pay

## 2019-02-01 ENCOUNTER — Emergency Department (HOSPITAL_COMMUNITY)
Admission: EM | Admit: 2019-02-01 | Discharge: 2019-02-01 | Disposition: A | Discharge status: Home / Self Care | Payer: Medicare HMO | Attending: Emergency Medicine | Admitting: Emergency Medicine

## 2019-02-01 ENCOUNTER — Encounter (HOSPITAL_COMMUNITY): Payer: Self-pay | Admitting: Emergency Medicine

## 2019-02-01 DIAGNOSIS — Z79899 Other long term (current) drug therapy: Secondary | ICD-10-CM | POA: Diagnosis not present

## 2019-02-01 DIAGNOSIS — Z87891 Personal history of nicotine dependence: Secondary | ICD-10-CM | POA: Insufficient documentation

## 2019-02-01 DIAGNOSIS — Z85828 Personal history of other malignant neoplasm of skin: Secondary | ICD-10-CM | POA: Diagnosis not present

## 2019-02-01 DIAGNOSIS — Z87442 Personal history of urinary calculi: Secondary | ICD-10-CM | POA: Diagnosis not present

## 2019-02-01 DIAGNOSIS — R61 Generalized hyperhidrosis: Secondary | ICD-10-CM | POA: Diagnosis not present

## 2019-02-01 DIAGNOSIS — R1084 Generalized abdominal pain: Secondary | ICD-10-CM | POA: Diagnosis not present

## 2019-02-01 DIAGNOSIS — R1030 Lower abdominal pain, unspecified: Secondary | ICD-10-CM | POA: Diagnosis not present

## 2019-02-01 DIAGNOSIS — R339 Retention of urine, unspecified: Secondary | ICD-10-CM | POA: Diagnosis not present

## 2019-02-01 DIAGNOSIS — I10 Essential (primary) hypertension: Secondary | ICD-10-CM | POA: Diagnosis not present

## 2019-02-01 LAB — URINALYSIS, ROUTINE W REFLEX MICROSCOPIC
Bilirubin Urine: NEGATIVE
Glucose, UA: NEGATIVE mg/dL
Hgb urine dipstick: NEGATIVE
Ketones, ur: NEGATIVE mg/dL
Nitrite: NEGATIVE
Protein, ur: NEGATIVE mg/dL
Specific Gravity, Urine: 1.023 (ref 1.005–1.030)
pH: 5 (ref 5.0–8.0)

## 2019-02-01 NOTE — ED Notes (Signed)
Pt reports pain relief post foley insertion and reports relief.

## 2019-02-01 NOTE — ED Triage Notes (Signed)
Pt having lower abd pains with bladder pressure. Has prolapsed bladder

## 2019-02-01 NOTE — ED Triage Notes (Signed)
Per [facility] pt from home for abd apins. Was given Fentanyl in route via IV in left AC.

## 2019-02-01 NOTE — ED Notes (Signed)
Pt tearful in lobby reports she feels like she is about to burst. Pt reports she has not urinated since 0900 this morning. This RN and NT Josh bladder scanned patient. Over 258ml's noted in lobby and pt reports pain in pelvic area. Pt reports she felt like she was retaining urine.

## 2019-02-01 NOTE — Discharge Instructions (Addendum)
Call your urologist tomorrow to schedule a follow-up visit

## 2019-02-01 NOTE — ED Provider Notes (Signed)
[facility] DEPT Provider Note   CSN: [id number] Arrival date & time: 02/01/19  1809     History   Chief Complaint Chief Complaint  Patient presents with  . Abdominal Pain  . bladder pressure    HPI Desiree Ortega is a 73 y.o. female.     73 year old female presents with acute onset of suprapubic pressure as well as urinary urgency.  No fever or chills.  No flank discomfort.  Patient felt that she could not urinate.  No prior history of same.  No treatment use prior to arrival     Past Medical History:  Diagnosis Date  . Allergy   . 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 Evorn Gong  . Diverticulosis    Moderate  . Female bladder prolapse   . H/O cold sores   . History of colon polyps   . History of kidney stones   . Hyperlipidemia   . Migraine with aura   . Osteopenia    prev on fosamax for a few years then started evista at age ~11, T score improved  from -2.29/-1.96 to -1.8/-1.7 as of 06/2011, repeat DXA done 2015    Patient Active Problem List   Diagnosis Date Noted  . Stye 09/11/2018  . Health care maintenance 08/09/2017  . Cold sore 07/15/2015  . Advance care planning 07/12/2014  . Intractable migraine with aura without status migrainosus 03/28/2014  . Medicare annual wellness visit, subsequent 07/03/2012  . Osteopenia 06/26/2011  . HLD (hyperlipidemia) 04/27/2007  . ALLERGY, ENVIRONMENTAL 04/27/2007    Past Surgical History:  Procedure Laterality Date  . BREAST BIOPSY Left    benign  . CATARACT EXTRACTION W/ INTRAOCULAR LENS IMPLANT Left 11/2016  . CATARACT EXTRACTION W/ INTRAOCULAR LENS IMPLANT Right 12/22/2016  . COLONOSCOPY  08/15/2013  . cyst on back     2 times  . cyst on eyelid     right eye  . CYSTOCELE REPAIR N/A 02/01/2018   Procedure: ANTERIOR REPAIR (CYSTOCELE);  Surgeon: Bjorn Loser, MD;  Location: [facility];  Service: Urology;  Laterality: N/A;  . CYSTOSCOPY  N/A 02/01/2018   Procedure: cystoscopy;  Surgeon: Bjorn Loser, MD;  Location: [facility];  Service: Urology;  Laterality: N/A;  . LAPAROSCOPIC VAGINAL HYSTERECTOMY WITH SALPINGO OOPHORECTOMY Bilateral 02/01/2018   Procedure: LAPAROSCOPIC ASSISTED VAGINAL HYSTERECTOMY WITH BILATERAL SALPINGO OOPHORECTOMY;  Surgeon: Servando Salina, MD;  Location: [facility];  Service: Gynecology;  Laterality: Bilateral;  . SQUAMOUS CELL CARCINOMA EXCISION     on back/removed 2 times/and thigh  . SQUAMOUS CELL CARCINOMA EXCISION Left 07/15/2016   left lower calf  . SSC removed  1988   Birthmark removal partial 1/3  . TONSILLECTOMY  1950  . TUBAL LIGATION  1980's     OB History   No obstetric history on file.      Home Medications    Prior to Admission medications   Medication Sig Start Date End Date Taking? Authorizing Provider  acetaminophen (TYLENOL) 500 MG tablet Take 500 mg by mouth 2 (two) times daily as needed (for pain.).    [provider]  Ascorbic Acid (VITAMIN C) 1000 MG tablet Take 1 tablet (1,000 mg total) by mouth daily. 08/21/18   Tonia Ghent, MD  b complex vitamins tablet Take 1 tablet by mouth daily.    [provider]  Boswellia-Glucosamine-Vit D (OSTEO BI-FLEX ONE PER DAY) TABS Take 1 tablet by mouth daily as  needed. 08/21/18   Tonia Ghent, MD  Cholecalciferol (VITAMIN D PO) Take by mouth.    [provider]  CINNAMON PO Take 1,000 mg by mouth daily.    [provider]  Cyanocobalamin (B-12 PO) Take by mouth.    [provider]  fexofenadine (ALLEGRA) 180 MG tablet Take 1 tablet (180 mg total) by mouth as needed. Seasonal allergies from Spring to Fall 08/21/18   Tonia Ghent, MD  Flaxseed, Linseed, (FLAX SEEDS PO) Take by mouth.    [provider]  Magnesium 250 MG TABS Take 250 mg by mouth daily.    [provider]  Melatonin 3 MG TABS Take 1 tablet (3 mg total) by mouth at bedtime as needed. 08/21/18   Tonia Ghent, MD  Multiple Vitamin (MULTIVITAMIN) capsule Take 1 capsule by mouth daily. 08/21/18   Tonia Ghent, MD  Omega-3 Fatty Acids (FISH OIL PO) Take by mouth.    [provider]  trimethoprim-polymyxin b (POLYTRIM) ophthalmic solution Place 1 drop into the left eye 4 (four) times daily. 09/09/18   Tonia Ghent, MD  Turmeric 500 MG CAPS Take by mouth.    [provider]  Ubiquinol 100 MG CAPS Take 100 mg by mouth daily. 08/21/18   Tonia Ghent, MD  Zinc 50 MG CAPS Take by mouth.    [provider]    Family History Family History  Problem Relation Age of Onset  . Osteopenia Mother   . Hypertension Father   . Heart disease Father        AFIB, PVD stents LE's, pacer  . Stroke Father   . Cancer Brother        prostate, prostatectomy, radiation 5 years later, Hormone Tx  . Prostate cancer Brother   . Heart disease Brother   . Cancer Brother        prostate, prostatectomy  . Prostate cancer Brother   . Diabetes Other   . Cancer Other        Non-Hodgkin's Lymphoma  . Depression Daughter   . Alcohol abuse Neg Hx   . Drug abuse Neg Hx   . Colon cancer Neg Hx   . Breast cancer Neg Hx   . Bladder Cancer Neg Hx   . Kidney cancer Neg Hx     Social History Social History   Tobacco Use  . Smoking status: Former Smoker    Packs/day: 0.50    Years: 36.00    Pack years: 18.00    Types: Cigarettes    Quit date: 06/22/2001    Years since quitting: 17.6  . Smokeless tobacco: Never Used  Substance Use Topics  . Alcohol use: Yes    Alcohol/week: 2.0 standard drinks    Types: 2 Glasses of wine per week  . Drug use: No     Allergies   Alendronate sodium, Codeine, and Influenza vaccines   Review of Systems Review of Systems  All other systems reviewed and are negative.    Physical Exam Updated Vital Signs BP (!) 170/82 (BP Location: Right Arm)   Pulse 70   Temp 99.1 F (37.3 C) (Oral)   Resp 17   SpO2 97%   Physical Exam Vitals  signs and nursing note reviewed.  Constitutional:      General: She is not in acute distress.    Appearance: Normal appearance. She is well-developed. She is not toxic-appearing.  HENT:     Head: Normocephalic and atraumatic.  Eyes:     General: Lids are normal.     Conjunctiva/sclera: Conjunctivae normal.     Pupils: Pupils are equal, round, and reactive to light.  Neck:     Musculoskeletal: Normal range of motion and neck supple.     Thyroid: No thyroid mass.     Trachea: No tracheal deviation.  Cardiovascular:     Rate and Rhythm: Normal rate and regular rhythm.     Heart sounds: Normal heart sounds. No murmur. No gallop.   Pulmonary:     Effort: Pulmonary effort is normal. No respiratory distress.     Breath sounds: Normal breath sounds. No stridor. No decreased breath sounds, wheezing, rhonchi or rales.  Abdominal:     General: Bowel sounds are normal. There is no distension.     Palpations: Abdomen is soft.     Tenderness: There is no abdominal tenderness. There is no rebound.  Musculoskeletal: Normal range of motion.        General: No tenderness.  Skin:    General: Skin is warm and dry.     Findings: No abrasion or rash.  Neurological:     Mental Status: She is alert and oriented to person, place, and time.     GCS: GCS eye subscore is 4. GCS verbal subscore is 5. GCS motor subscore is 6.     Cranial Nerves: No cranial nerve deficit.     Sensory: No sensory deficit.  Psychiatric:        Speech: Speech normal.        Behavior: Behavior normal.      ED Treatments / Results  Labs (all labs ordered are listed, but only abnormal results are displayed) Labs Reviewed  URINE CULTURE  URINALYSIS, ROUTINE W REFLEX MICROSCOPIC    EKG None  Radiology No results found.  Procedures Procedures (including critical care time)  Medications Ordered in ED Medications - No data to display   Initial Impression / Assessment and Plan / ED Course  I have reviewed the  triage vital signs and the nursing notes.  Pertinent labs & imaging results that were available during my care of the patient were reviewed by me and considered in my medical decision making (see chart for details).        Patient had urinalysis performed which is negative.  Foley placed by nursing and will be sent home with leg bag as well as was instructed to see her urologist tomorrow  Final Clinical Impressions(s) / ED Diagnoses   Final diagnoses:  None    ED Discharge Orders    None       Lacretia Leigh, MD 02/01/19 2056

## 2019-02-02 DIAGNOSIS — N8111 Cystocele, midline: Secondary | ICD-10-CM | POA: Diagnosis not present

## 2019-02-02 DIAGNOSIS — R350 Frequency of micturition: Secondary | ICD-10-CM | POA: Diagnosis not present

## 2019-02-02 LAB — URINE CULTURE: Culture: NO GROWTH

## 2019-02-06 ENCOUNTER — Encounter: Payer: Self-pay | Admitting: Urology

## 2019-02-06 ENCOUNTER — Other Ambulatory Visit: Payer: Self-pay

## 2019-02-06 ENCOUNTER — Ambulatory Visit: Payer: Medicare HMO | Admitting: Urology

## 2019-02-06 VITALS — BP 138/64 | HR 72 | Ht 66.0 in | Wt 151.0 lb

## 2019-02-06 DIAGNOSIS — R339 Retention of urine, unspecified: Secondary | ICD-10-CM | POA: Diagnosis not present

## 2019-02-06 LAB — URINALYSIS, COMPLETE
Bilirubin, UA: NEGATIVE
Glucose, UA: NEGATIVE
Ketones, UA: NEGATIVE
Nitrite, UA: NEGATIVE
Specific Gravity, UA: >1.03 — ABNORMAL HIGH (ref 1.005–1.030)
Urobilinogen, Ur: 0.2 mg/dL (ref 0.2–1.0)
pH, UA: 6 (ref 5.0–7.5)

## 2019-02-06 LAB — MICROSCOPIC EXAMINATION: RBC: >30 /hpf — AB (ref 0–2)

## 2019-02-06 LAB — BLADDER SCAN AMB NON-IMAGING

## 2019-02-06 NOTE — Progress Notes (Signed)
02/06/2019 8:26 AM   Desiree Ortega 1945-09-10 [id number]  Referring provider: Tonia Ghent, MD [address] [place],  [place] [address]  Chief Complaint  Patient presents with  . Urinary Retention    HPI: Incomplete bladder emptying stable.  Patient is now on Mobic and an antibiotic with culture pending.  Trial of voiding.  Questions answered.   Voided well; will arrange PT; pleased with disuccion  No UTI clincally   PMH: Past Medical History:  Diagnosis Date  . Allergy   . 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 Evorn Gong  . Diverticulosis    Moderate  . Female bladder prolapse   . H/O cold sores   . History of colon polyps   . History of kidney stones   . Hyperlipidemia   . Migraine with aura   . Osteopenia    prev on fosamax for a few years then started evista at age ~40, T score improved  from -2.29/-1.96 to -1.8/-1.7 as of 06/2011, repeat DXA done 2015    Surgical History: Past Surgical History:  Procedure Laterality Date  . BREAST BIOPSY Left    benign  . CATARACT EXTRACTION W/ INTRAOCULAR LENS IMPLANT Left 11/2016  . CATARACT EXTRACTION W/ INTRAOCULAR LENS IMPLANT Right 12/22/2016  . COLONOSCOPY  08/15/2013  . cyst on back     2 times  . cyst on eyelid     right eye  . CYSTOCELE REPAIR N/A 02/01/2018   Procedure: ANTERIOR REPAIR (CYSTOCELE);  Surgeon: Bjorn Loser, MD;  Location: [facility];  Service: Urology;  Laterality: N/A;  . CYSTOSCOPY N/A 02/01/2018   Procedure: cystoscopy;  Surgeon: Bjorn Loser, MD;  Location: [facility];  Service: Urology;  Laterality: N/A;  . LAPAROSCOPIC VAGINAL HYSTERECTOMY WITH SALPINGO OOPHORECTOMY Bilateral 02/01/2018   Procedure: LAPAROSCOPIC ASSISTED VAGINAL HYSTERECTOMY WITH BILATERAL SALPINGO OOPHORECTOMY;  Surgeon: Servando Salina, MD;  Location: [facility];  Service: Gynecology;  Laterality: Bilateral;  . SQUAMOUS CELL CARCINOMA EXCISION     on back/removed 2  times/and thigh  . SQUAMOUS CELL CARCINOMA EXCISION Left 07/15/2016   left lower calf  . SSC removed  1988   Birthmark removal partial 1/3  . TONSILLECTOMY  1950  . TUBAL LIGATION  1980's    Home Medications:  Allergies as of 02/06/2019      Reactions   Alendronate Sodium Other (See Comments)   heartburn   Codeine Nausea Only   Influenza Vaccines    Local reaction to vaccine in 2014      Medication List       Accurate as of February 06, 2019  8:26 AM. If you have any questions, ask your nurse or doctor.        acetaminophen 500 MG tablet Commonly known as: TYLENOL Take 500 mg by mouth 2 (two) times daily as needed (for pain.).   b complex vitamins tablet Take 1 tablet by mouth daily.   B-12 PO Take by mouth.   CINNAMON PO Take 1,000 mg by mouth daily.   fexofenadine 180 MG tablet Commonly known as: ALLEGRA Take 1 tablet (180 mg total) by mouth as needed. Seasonal allergies from Spring to Fall   FISH OIL PO Take by mouth.   FLAX SEEDS PO Take by mouth.   Magnesium 250 MG Tabs Take 250 mg by mouth daily.   Melatonin 3 MG Tabs Take 1 tablet (3 mg total) by mouth at bedtime as needed.  meloxicam 15 MG tablet Commonly known as: MOBIC   multivitamin capsule Take 1 capsule by mouth daily.   Osteo Bi-Flex One Per Day Tabs Take 1 tablet by mouth daily as needed.   sulfamethoxazole-trimethoprim 800-160 MG tablet Commonly known as: BACTRIM DS Take 1 tablet by mouth 2 (two) times daily.   trimethoprim-polymyxin b ophthalmic solution Commonly known as: Polytrim Place 1 drop into the left eye 4 (four) times daily.   Turmeric 500 MG Caps Take by mouth.   Ubiquinol 100 MG Caps Take 100 mg by mouth daily.   vitamin C 1000 MG tablet Take 1 tablet (1,000 mg total) by mouth daily.   VITAMIN D PO Take by mouth.   Zinc 50 MG Caps Take by mouth.       Allergies:  Allergies  Allergen Reactions  . Alendronate Sodium Other (See Comments)    heartburn   . Codeine Nausea Only  . Influenza Vaccines     Local reaction to vaccine in 2014    Family History: Family History  Problem Relation Age of Onset  . Osteopenia Mother   . Hypertension Father   . Heart disease Father        AFIB, PVD stents LE's, pacer  . Stroke Father   . Cancer Brother        prostate, prostatectomy, radiation 5 years later, Hormone Tx  . Prostate cancer Brother   . Heart disease Brother   . Cancer Brother        prostate, prostatectomy  . Prostate cancer Brother   . Diabetes Other   . Cancer Other        Non-Hodgkin's Lymphoma  . Depression Daughter   . Alcohol abuse Neg Hx   . Drug abuse Neg Hx   . Colon cancer Neg Hx   . Breast cancer Neg Hx   . Bladder Cancer Neg Hx   . Kidney cancer Neg Hx     Social History:  reports that she quit smoking about 17 years ago. Her smoking use included cigarettes. She has a 18.00 pack-year smoking history. She has never used smokeless tobacco. She reports current alcohol use of about 2.0 standard drinks of alcohol per week. She reports that she does not use drugs.  ROS: UROLOGY Frequent Urination?: No Hard to postpone urination?: No Burning/pain with urination?: No Get up at night to urinate?: No Leakage of urine?: No Urine stream starts and stops?: No Trouble starting stream?: No Do you have to strain to urinate?: No Blood in urine?: No Urinary tract infection?: No Sexually transmitted disease?: No Injury to kidneys or bladder?: No Painful intercourse?: No Weak stream?: No Currently pregnant?: No Vaginal bleeding?: No Last menstrual period?: N  Gastrointestinal Nausea?: No Vomiting?: No Indigestion/heartburn?: No Diarrhea?: No Constipation?: No  Constitutional Fever: No Night sweats?: No Weight loss?: No Fatigue?: No  Skin Skin rash/lesions?: No Itching?: No  Eyes Blurred vision?: No Double vision?: No  Ears/Nose/Throat Sore throat?: No Sinus problems?: No  Hematologic/Lymphatic  Swollen glands?: No Easy bruising?: No  Cardiovascular Leg swelling?: No Chest pain?: No  Respiratory Cough?: No Shortness of breath?: No  Endocrine Excessive thirst?: No  Musculoskeletal Back pain?: No Joint pain?: No  Neurological Headaches?: No Dizziness?: No  Psychologic Depression?: No Anxiety?: No  Physical Exam: BP 138/64   Pulse 72   Ht 5\' 6"  (1.676 m)   Wt 151 lb (68.5 kg)   BMI 24.37 kg/m   Constitutional:  Alert and oriented, No acute distress.  Laboratory Data: Lab Results  Component Value Date   WBC 7.4 01/27/2018   HGB 12.9 02/02/2018   HCT 38.2 02/02/2018   MCV 89.7 01/27/2018   PLT 242 01/27/2018    Lab Results  Component Value Date   CREATININE 0.67 08/12/2018    No results found for: PSA  No results found for: TESTOSTERONE  No results found for: HGBA1C  Urinalysis    Component Value Date/Time   COLORURINE YELLOW 02/01/2019 1933   APPEARANCEUR CLEAR 02/01/2019 1933   APPEARANCEUR Clear 09/13/2017 1008   LABSPEC 1.023 02/01/2019 1933   PHURINE 5.0 02/01/2019 1933   GLUCOSEU NEGATIVE 02/01/2019 1933   HGBUR NEGATIVE 02/01/2019 1933   BILIRUBINUR NEGATIVE 02/01/2019 1933   BILIRUBINUR Negative 09/13/2017 1008   [place] 02/01/2019 1933   PROTEINUR NEGATIVE 02/01/2019 1933   NITRITE NEGATIVE 02/01/2019 1933   LEUKOCYTESUR TRACE (A) 02/01/2019 1933    Pertinent Imaging:   Assessment & Plan:  Have pt call her  There are no diagnoses linked to this encounter.  No follow-ups on file.  Reece Packer, MD  [facility] [address], [place],  [address] [phone]

## 2019-02-06 NOTE — Progress Notes (Signed)
Fill and Pull Catheter Removal  Patient is present today for a catheter removal.  Patient was cleaned and prepped in a sterile fashion 247ml of sterile water/ saline was instilled into the bladder when the patient felt the urge to urinate. 53ml of water was then drained from the balloon.  A 16FR foley cath was removed from the bladder no complications were noted .  Patient as then given some time to void on their own.  Patient can void  273ml on their own after some time.  Patient tolerated well.  Performed by: Verlene Mayer, CMA  Follow up/ Additional notes: Return this afternoon for PVR   Bladder Scan Patient can void: 5 ml Performed By: Verlene Mayer, CMA

## 2019-02-08 DIAGNOSIS — R102 Pelvic and perineal pain: Secondary | ICD-10-CM | POA: Diagnosis not present

## 2019-02-08 DIAGNOSIS — M62838 Other muscle spasm: Secondary | ICD-10-CM | POA: Diagnosis not present

## 2019-02-08 DIAGNOSIS — M6289 Other specified disorders of muscle: Secondary | ICD-10-CM | POA: Diagnosis not present

## 2019-02-08 DIAGNOSIS — M6281 Muscle weakness (generalized): Secondary | ICD-10-CM | POA: Diagnosis not present

## 2019-02-08 DIAGNOSIS — N8111 Cystocele, midline: Secondary | ICD-10-CM | POA: Diagnosis not present

## 2019-02-08 LAB — URINE CULTURE: Organism ID, Bacteria: NO GROWTH

## 2019-02-16 DIAGNOSIS — D225 Melanocytic nevi of trunk: Secondary | ICD-10-CM | POA: Diagnosis not present

## 2019-02-16 DIAGNOSIS — D2261 Melanocytic nevi of right upper limb, including shoulder: Secondary | ICD-10-CM | POA: Diagnosis not present

## 2019-02-16 DIAGNOSIS — L570 Actinic keratosis: Secondary | ICD-10-CM | POA: Diagnosis not present

## 2019-02-16 DIAGNOSIS — D2262 Melanocytic nevi of left upper limb, including shoulder: Secondary | ICD-10-CM | POA: Diagnosis not present

## 2019-02-22 DIAGNOSIS — M6289 Other specified disorders of muscle: Secondary | ICD-10-CM | POA: Diagnosis not present

## 2019-02-22 DIAGNOSIS — R102 Pelvic and perineal pain: Secondary | ICD-10-CM | POA: Diagnosis not present

## 2019-02-22 DIAGNOSIS — M62838 Other muscle spasm: Secondary | ICD-10-CM | POA: Diagnosis not present

## 2019-02-22 DIAGNOSIS — N8111 Cystocele, midline: Secondary | ICD-10-CM | POA: Diagnosis not present

## 2019-02-22 DIAGNOSIS — M6281 Muscle weakness (generalized): Secondary | ICD-10-CM | POA: Diagnosis not present

## 2019-06-19 DIAGNOSIS — H524 Presbyopia: Secondary | ICD-10-CM | POA: Diagnosis not present

## 2019-06-30 ENCOUNTER — Encounter: Payer: Self-pay | Admitting: Family Medicine

## 2019-07-01 ENCOUNTER — Encounter: Payer: Self-pay | Admitting: Family Medicine

## 2019-08-07 DIAGNOSIS — R69 Illness, unspecified: Secondary | ICD-10-CM | POA: Diagnosis not present

## 2019-08-13 ENCOUNTER — Other Ambulatory Visit: Payer: Self-pay | Admitting: Family Medicine

## 2019-08-13 DIAGNOSIS — M8580 Other specified disorders of bone density and structure, unspecified site: Secondary | ICD-10-CM

## 2019-08-13 DIAGNOSIS — E785 Hyperlipidemia, unspecified: Secondary | ICD-10-CM

## 2019-08-18 ENCOUNTER — Other Ambulatory Visit: Payer: Self-pay

## 2019-08-18 ENCOUNTER — Ambulatory Visit: Payer: Medicare HMO

## 2019-08-18 ENCOUNTER — Ambulatory Visit (INDEPENDENT_AMBULATORY_CARE_PROVIDER_SITE_OTHER): Payer: Medicare HMO

## 2019-08-18 ENCOUNTER — Other Ambulatory Visit (INDEPENDENT_AMBULATORY_CARE_PROVIDER_SITE_OTHER): Payer: Medicare HMO

## 2019-08-18 DIAGNOSIS — E785 Hyperlipidemia, unspecified: Secondary | ICD-10-CM

## 2019-08-18 DIAGNOSIS — Z0000 Encounter for general adult medical examination without abnormal findings: Secondary | ICD-10-CM

## 2019-08-18 DIAGNOSIS — M8580 Other specified disorders of bone density and structure, unspecified site: Secondary | ICD-10-CM | POA: Diagnosis not present

## 2019-08-18 LAB — COMPREHENSIVE METABOLIC PANEL
ALT: 12 U/L (ref 0–35)
AST: 15 U/L (ref 0–37)
Albumin: 3.8 g/dL (ref 3.5–5.2)
Alkaline Phosphatase: 67 U/L (ref 39–117)
BUN: 15 mg/dL (ref 6–23)
CO2: 31 mEq/L (ref 19–32)
Calcium: 9.2 mg/dL (ref 8.4–10.5)
Chloride: 104 mEq/L (ref 96–112)
Creatinine, Ser: 0.67 mg/dL (ref 0.40–1.20)
GFR: 85.64 mL/min (ref >60.00)
Glucose, Bld: 97 mg/dL (ref 70–99)
Potassium: 4.5 mEq/L (ref 3.5–5.1)
Sodium: 139 mEq/L (ref 135–145)
Total Bilirubin: 0.6 mg/dL (ref 0.2–1.2)
Total Protein: 6.6 g/dL (ref 6.0–8.3)

## 2019-08-18 LAB — LIPID PANEL
Cholesterol: 239 mg/dL — ABNORMAL HIGH (ref 0–200)
HDL: 53 mg/dL (ref >39.00)
LDL Cholesterol: 154 mg/dL — ABNORMAL HIGH (ref 0–99)
NonHDL: 186.26
Total CHOL/HDL Ratio: 5
Triglycerides: 159 mg/dL — ABNORMAL HIGH (ref 0.0–149.0)
VLDL: 31.8 mg/dL (ref 0.0–40.0)

## 2019-08-18 LAB — VITAMIN D 25 HYDROXY (VIT D DEFICIENCY, FRACTURES): VITD: 58.56 ng/mL (ref 30.00–100.00)

## 2019-08-18 NOTE — Patient Instructions (Signed)
[name] , Thank you for taking time to come for your Medicare Wellness Visit. I appreciate your ongoing commitment to your health goals. Please review the following plan we discussed and let me know if I can assist you in the future.   Screening recommendations/referrals: Colonoscopy: discuss with provider Mammogram: discuss with provider Bone Density: completed 08/22/2013 Recommended yearly ophthalmology/optometry visit for glaucoma screening and checkup Recommended yearly dental visit for hygiene and checkup  Vaccinations: Influenza vaccine: allergic Pneumococcal vaccine: Completed series Tdap vaccine: Up to date, completed 04/29/2015 Shingles vaccine: discussed     Advanced directives: copy in chart  Conditions/risks identified: hyperlipidemia  Next appointment: 08/22/2019 @ 8:30 am    Preventive Care 22 Years and Older, Female Preventive care refers to lifestyle choices and visits with your health care provider that can promote health and wellness. What does preventive care include?  A yearly physical exam. This is also called an annual well check.  Dental exams once or twice a year.  Routine eye exams. Ask your health care provider how often you should have your eyes checked.  Personal lifestyle choices, including:  Daily care of your teeth and gums.  Regular physical activity.  Eating a healthy diet.  Avoiding tobacco and drug use.  Limiting alcohol use.  Practicing safe sex.  Taking low-dose aspirin every day.  Taking vitamin and mineral supplements as recommended by your health care provider. What happens during an annual well check? The services and screenings done by your health care provider during your annual well check will depend on your age, overall health, lifestyle risk factors, and family history of disease. Counseling  Your health care provider may ask you questions about your:  Alcohol use.  Tobacco use.  Drug use.  Emotional  well-being.  Home and relationship well-being.  Sexual activity.  Eating habits.  History of falls.  Memory and ability to understand (cognition).  Work and work Statistician.  Reproductive health. Screening  You may have the following tests or measurements:  Height, weight, and BMI.  Blood pressure.  Lipid and cholesterol levels. These may be checked every 5 years, or more frequently if you are over 68 years old.  Skin check.  Lung cancer screening. You may have this screening every year starting at age 42 if you have a 30-pack-year history of smoking and currently smoke or have quit within the past 15 years.  Fecal occult blood test (FOBT) of the stool. You may have this test every year starting at age 66.  Flexible sigmoidoscopy or colonoscopy. You may have a sigmoidoscopy every 5 years or a colonoscopy every 10 years starting at age 16.  Hepatitis C blood test.  Hepatitis B blood test.  Sexually transmitted disease (STD) testing.  Diabetes screening. This is done by checking your blood sugar (glucose) after you have not eaten for a while (fasting). You may have this done every 1-3 years.  Bone density scan. This is done to screen for osteoporosis. You may have this done starting at age 87.  Mammogram. This may be done every 1-2 years. Talk to your health care provider about how often you should have regular mammograms. Talk with your health care provider about your test results, treatment options, and if necessary, the need for more tests. Vaccines  Your health care provider may recommend certain vaccines, such as:  Influenza vaccine. This is recommended every year.  Tetanus, diphtheria, and acellular pertussis (Tdap, Td) vaccine. You may need a Td booster every 10 years.  Zoster vaccine. You may need this after age 61.  Pneumococcal 13-valent conjugate (PCV13) vaccine. One dose is recommended after age 54.  Pneumococcal polysaccharide (PPSV23) vaccine. One  dose is recommended after age 41. Talk to your health care provider about which screenings and vaccines you need and how often you need them. This information is not intended to replace advice given to you by your health care provider. Make sure you discuss any questions you have with your health care provider. Document Released: 07/05/2015 Document Revised: 02/26/2016 Document Reviewed: 04/09/2015 Elsevier Interactive Patient Education  2017 [place] Prevention in the Home Falls can cause injuries. They can happen to people of all ages. There are many things you can do to make your home safe and to help prevent falls. What can I do on the outside of my home?  Regularly fix the edges of walkways and driveways and fix any cracks.  Remove anything that might make you trip as you walk through a door, such as a raised step or threshold.  Trim any bushes or trees on the path to your home.  Use bright outdoor lighting.  Clear any walking paths of anything that might make someone trip, such as rocks or tools.  Regularly check to see if handrails are loose or broken. Make sure that both sides of any steps have handrails.  Any raised decks and porches should have guardrails on the edges.  Have any leaves, snow, or ice cleared regularly.  Use sand or salt on walking paths during winter.  Clean up any spills in your garage right away. This includes oil or grease spills. What can I do in the bathroom?  Use night lights.  Install grab bars by the toilet and in the tub and shower. Do not use towel bars as grab bars.  Use non-skid mats or decals in the tub or shower.  If you need to sit down in the shower, use a plastic, non-slip stool.  Keep the floor dry. Clean up any water that spills on the floor as soon as it happens.  Remove soap buildup in the tub or shower regularly.  Attach bath mats securely with double-sided non-slip rug tape.  Do not have throw rugs and other  things on the floor that can make you trip. What can I do in the bedroom?  Use night lights.  Make sure that you have a light by your bed that is easy to reach.  Do not use any sheets or blankets that are too big for your bed. They should not hang down onto the floor.  Have a firm chair that has side arms. You can use this for support while you get dressed.  Do not have throw rugs and other things on the floor that can make you trip. What can I do in the kitchen?  Clean up any spills right away.  Avoid walking on wet floors.  Keep items that you use a lot in easy-to-reach places.  If you need to reach something above you, use a strong step stool that has a grab bar.  Keep electrical cords out of the way.  Do not use floor polish or wax that makes floors slippery. If you must use wax, use non-skid floor wax.  Do not have throw rugs and other things on the floor that can make you trip. What can I do with my stairs?  Do not leave any items on the stairs.  Make sure that there are  handrails on both sides of the stairs and use them. Fix handrails that are broken or loose. Make sure that handrails are as long as the stairways.  Check any carpeting to make sure that it is firmly attached to the stairs. Fix any carpet that is loose or worn.  Avoid having throw rugs at the top or bottom of the stairs. If you do have throw rugs, attach them to the floor with carpet tape.  Make sure that you have a light switch at the top of the stairs and the bottom of the stairs. If you do not have them, ask someone to add them for you. What else can I do to help prevent falls?  Wear shoes that:  Do not have high heels.  Have rubber bottoms.  Are comfortable and fit you well.  Are closed at the toe. Do not wear sandals.  If you use a stepladder:  Make sure that it is fully opened. Do not climb a closed stepladder.  Make sure that both sides of the stepladder are locked into place.  Ask  someone to hold it for you, if possible.  Clearly mark and make sure that you can see:  Any grab bars or handrails.  First and last steps.  Where the edge of each step is.  Use tools that help you move around (mobility aids) if they are needed. These include:  Canes.  Walkers.  Scooters.  Crutches.  Turn on the lights when you go into a dark area. Replace any light bulbs as soon as they burn out.  Set up your furniture so you have a clear path. Avoid moving your furniture around.  If any of your floors are uneven, fix them.  If there are any pets around you, be aware of where they are.  Review your medicines with your doctor. Some medicines can make you feel dizzy. This can increase your chance of falling. Ask your doctor what other things that you can do to help prevent falls. This information is not intended to replace advice given to you by your health care provider. Make sure you discuss any questions you have with your health care provider. Document Released: 04/04/2009 Document Revised: 11/14/2015 Document Reviewed: 07/13/2014 Elsevier Interactive Patient Education  2017 [employer].

## 2019-08-18 NOTE — Progress Notes (Signed)
Subjective:   Desiree Ortega is a 74 y.o. female who presents for Medicare Annual (Subsequent) preventive examination.  Review of Systems: N/A   This visit is being conducted through telemedicine via telephone at the nurse health advisor's home address due to the COVID-19 pandemic. This patient has given me verbal consent via doximity to conduct this visit, patient states they are participating from their home address. Patient and myself are on the telephone call. There is no referral for this visit. Some vital signs may be absent or patient reported.    Patient identification: identified by name, DOB, and current address   Cardiac Risk Factors include: advanced age (>63men, >25 women);dyslipidemia     Objective:     Vitals: There were no vitals taken for this visit.  There is no height or weight on file to calculate BMI.  Advanced Directives 08/18/2019 02/01/2019 08/12/2018 02/09/2018 02/01/2018 01/27/2018 08/04/2017  Does Patient Have a Medical Advance Directive? Yes No Yes Yes Yes Yes No  Type of Paramedic of [place];Living will - Living will;Healthcare Power of [place];Living will [place];Living will -  Does patient want to make changes to medical advance directive? - - - No - Patient declined No - Patient declined No - Patient declined -  Copy of [place] in Chart? Yes - validated most recent copy scanned in chart (See row information) - Yes - validated most recent copy scanned in chart (See row information) Yes Yes Yes -  Would patient like information on creating a medical advance directive? - - - - - - No - Patient declined    Tobacco Social History   Tobacco Use  Smoking Status Former Smoker  . Packs/day: 0.50  . Years: 36.00  . Pack years: 18.00  . Types: Cigarettes  . Quit date: 06/22/2001  . Years since quitting: 18.1  Smokeless Tobacco Never Used       Counseling given: Not Answered   Clinical Intake:  Pre-visit preparation completed: Yes  Pain : No/denies pain     Nutritional Risks: None Diabetes: No  How often do you need to have someone help you when you read instructions, pamphlets, or other written materials from your doctor or pharmacy?: 1 - Never What is the last grade level you completed in school?: 12th  Interpreter Needed?: No  Information entered by :: CJohnson, LPN  Past Medical History:  Diagnosis Date  . Allergy   . 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 Evorn Gong  . Diverticulosis    Moderate  . Female bladder prolapse   . H/O cold sores   . History of colon polyps   . History of kidney stones   . Hyperlipidemia   . Migraine with aura   . Osteopenia    prev on fosamax for a few years then started evista at age ~89, T score improved  from -2.29/-1.96 to -1.8/-1.7 as of 06/2011, repeat DXA done 2015   Past Surgical History:  Procedure Laterality Date  . BREAST BIOPSY Left    benign  . CATARACT EXTRACTION W/ INTRAOCULAR LENS IMPLANT Left 11/2016  . CATARACT EXTRACTION W/ INTRAOCULAR LENS IMPLANT Right 12/22/2016  . COLONOSCOPY  08/15/2013  . cyst on back     2 times  . cyst on eyelid     right eye  . CYSTOCELE REPAIR N/A 02/01/2018   Procedure:  ANTERIOR REPAIR (CYSTOCELE);  Surgeon: Bjorn Loser, MD;  Location: [facility];  Service: Urology;  Laterality: N/A;  . CYSTOSCOPY N/A 02/01/2018   Procedure: cystoscopy;  Surgeon: Bjorn Loser, MD;  Location: [facility];  Service: Urology;  Laterality: N/A;  . LAPAROSCOPIC VAGINAL HYSTERECTOMY WITH SALPINGO OOPHORECTOMY Bilateral 02/01/2018   Procedure: LAPAROSCOPIC ASSISTED VAGINAL HYSTERECTOMY WITH BILATERAL SALPINGO OOPHORECTOMY;  Surgeon: Servando Salina, MD;  Location: [facility];  Service: Gynecology;  Laterality: Bilateral;  . SQUAMOUS CELL CARCINOMA EXCISION     on back/removed 2 times/and thigh  .  SQUAMOUS CELL CARCINOMA EXCISION Left 07/15/2016   left lower calf  . SSC removed  1988   Birthmark removal partial 1/3  . TONSILLECTOMY  1950  . TUBAL LIGATION  1980's   Family History  Problem Relation Age of Onset  . Osteopenia Mother   . Hypertension Father   . Heart disease Father        AFIB, PVD stents LE's, pacer  . Stroke Father   . Cancer Brother        prostate, prostatectomy, radiation 5 years later, Hormone Tx  . Prostate cancer Brother   . Heart disease Brother   . Cancer Brother        prostate, prostatectomy  . Prostate cancer Brother   . Diabetes Other   . Cancer Other        Non-Hodgkin's Lymphoma  . Depression Daughter   . Alcohol abuse Neg Hx   . Drug abuse Neg Hx   . Colon cancer Neg Hx   . Breast cancer Neg Hx   . Bladder Cancer Neg Hx   . Kidney cancer Neg Hx    Social History   Socioeconomic History  . Marital status: Married    Spouse name: Not on file  . Number of children: 4  . Years of education: Not on file  . Highest education level: Not on file  Occupational History  . Occupation: [employer]    Comment: Teaches swimming  Tobacco Use  . Smoking status: Former Smoker    Packs/day: 0.50    Years: 36.00    Pack years: 18.00    Types: Cigarettes    Quit date: 06/22/2001    Years since quitting: 18.1  . Smokeless tobacco: Never Used  Substance and Sexual Activity  . Alcohol use: Yes    Alcohol/week: 2.0 standard drinks    Types: 2 Glasses of wine per week    Comment: occasionally  . Drug use: No  . Sexual activity: Yes    Birth control/protection: Post-menopausal  Other Topics Concern  . Not on file  Social History Narrative   Married 1963, lives with husband   [employer] fan   Mother in ALF as of 2020 (pt's mother was born in [place])   4 daughters   Taught swimming prev   Social Determinants of Health   Financial Resource Strain: Low Risk   . Difficulty of Paying Living Expenses: Not hard at all    Food Insecurity: No Food Insecurity  . Worried About Charity fundraiser in the Last Year: Never true  . Ran Out of Food in the Last Year: Never true  Transportation Needs: No Transportation Needs  . Lack of Transportation (Medical): No  . Lack of Transportation (Non-Medical): No  Physical Activity: Sufficiently Active  . Days of Exercise per Week: 4 days  . Minutes of Exercise per Session: 60 min  Stress: No Stress Concern Present  .  Feeling of Stress : Not at all  Social Connections:   . Frequency of Communication with Friends and Family: Not on file  . Frequency of Social Gatherings with Friends and Family: Not on file  . Attends Religious Services: Not on file  . Active Member of Clubs or Organizations: Not on file  . Attends Archivist Meetings: Not on file  . Marital Status: Not on file    Outpatient Encounter Medications as of 08/18/2019  Medication Sig  . acetaminophen (TYLENOL) 500 MG tablet Take 500 mg by mouth 2 (two) times daily as needed (for pain.).  Marland Kitchen Ascorbic Acid (VITAMIN C) 1000 MG tablet Take 1 tablet (1,000 mg total) by mouth daily.  Marland Kitchen b complex vitamins tablet Take 1 tablet by mouth daily.  . [place]-Glucosamine-Vit D (OSTEO BI-FLEX ONE PER DAY) TABS Take 1 tablet by mouth daily as needed.  . Cholecalciferol (VITAMIN D PO) Take by mouth.  Marland Kitchen CINNAMON PO Take 1,000 mg by mouth daily.  . Cyanocobalamin (B-12 PO) Take by mouth.  . fexofenadine (ALLEGRA) 180 MG tablet Take 1 tablet (180 mg total) by mouth as needed. Seasonal allergies from Spring to Fall  . Flaxseed, Linseed, (FLAX SEEDS PO) Take by mouth.  . Magnesium 250 MG TABS Take 250 mg by mouth daily.  . Melatonin 3 MG TABS Take 1 tablet (3 mg total) by mouth at bedtime as needed.  . Multiple Vitamin (MULTIVITAMIN) capsule Take 1 capsule by mouth daily.  . Omega-3 Fatty Acids (FISH OIL PO) Take by mouth.  . Turmeric 500 MG CAPS Take by mouth.  . Ubiquinol 100 MG CAPS Take 100 mg by mouth daily.   . Zinc 50 MG CAPS Take by mouth.  . meloxicam (MOBIC) 15 MG tablet   . sulfamethoxazole-trimethoprim (BACTRIM DS) 800-160 MG tablet Take 1 tablet by mouth 2 (two) times daily.  Marland Kitchen trimethoprim-polymyxin b (POLYTRIM) ophthalmic solution Place 1 drop into the left eye 4 (four) times daily.   No facility-administered encounter medications on file as of 08/18/2019.    Activities of Daily Living In your present state of health, do you have any difficulty performing the following activities: 08/18/2019  Hearing? N  Vision? N  Difficulty concentrating or making decisions? N  Walking or climbing stairs? N  Dressing or bathing? N  Doing errands, shopping? N  Preparing Food and eating ? N  Using the Toilet? N  In the past six months, have you accidently leaked urine? N  Do you have problems with loss of bowel control? N  Managing your Medications? N  Managing your Finances? N  Housekeeping or managing your Housekeeping? N  Some recent data might be hidden    Patient Care Team: Tonia Ghent, MD as PCP - General (Family Medicine) Bryson Ha, OD as Consulting Physician (Optometry) Dasher, Rayvon Char, MD as Consulting Physician (Dermatology) Altha Harm, DDS as Consulting Physician (Dentistry)    Assessment:   This is a routine wellness examination for [place].  Exercise Activities and Dietary recommendations Current Exercise Habits: Home exercise routine, Type of exercise: strength training/weights;yoga;walking, Time (Minutes): 60, Frequency (Times/Week): 4, Weekly Exercise (Minutes/Week): 240, Intensity: Moderate, Exercise limited by: None identified  Goals    . Increase physical activity     Starting 08/12/2018, I will continue to exercise for at least 60 minutes 6 days per week.     . Patient Stated     08/18/2019, I will continue doing cardio, yoga and weight lifting 3-4 days  a week for about 1 hour.        Fall Risk Fall Risk  08/18/2019 08/12/2018 08/04/2017 07/16/2016  07/15/2015  Falls in the past year? 0 0 No No No  Number falls in past yr: 0 - - - -  Injury with Fall? 0 - - - -  Risk for fall due to : No Fall Risks - - - -  Follow up Falls evaluation completed;Falls prevention discussed - - - -   Is the patient's home free of loose throw rugs in walkways, pet beds, electrical cords, etc?   yes      Grab bars in the bathroom? yes      Handrails on the stairs?   yes      Adequate lighting?   yes  Timed Get Up and Go performed: N/A  Depression Screen PHQ 2/9 Scores 08/18/2019 08/12/2018 08/04/2017 07/16/2016  PHQ - 2 Score 0 0 0 0  PHQ- 9 Score 0 0 0 -     Cognitive Function MMSE - Mini Mental State Exam 08/18/2019 08/12/2018 08/04/2017 07/16/2016  Orientation to time 5 5 5 5   Orientation to Place 5 5 5 5   Registration 3 3 3 3   Attention/ Calculation 5 0 0 0  Recall 3 3 2 3   Recall-comments - - unable to recall 1 of 3 words -  Language- name 2 objects - 0 0 0  Language- repeat 1 1 1 1   Language- follow 3 step command - 3 3 3   Language- read & follow direction - 0 0 0  Write a sentence - 0 0 0  Copy design - 0 0 0  Total score - 20 19 20   Mini Cog  Mini-Cog screen was completed. Maximum score is 22. A value of 0 denotes this part of the MMSE was not completed or the patient failed this part of the Mini-Cog screening.       Immunization History  Administered Date(s) Administered  . Influenza, Seasonal, Injecte, Preservative Fre 07/01/2012  . Pneumococcal Conjugate-13 07/15/2015  . Pneumococcal Polysaccharide-23 06/25/2011  . Td 11/20/1997, 06/11/2008  . Tdap 04/29/2015    Qualifies for Shingles Vaccine: Yes  Screening Tests Health Maintenance  Topic Date Due  . COLONOSCOPY  08/15/2018  . MAMMOGRAM  09/01/2018  . TETANUS/TDAP  04/28/2025  . DEXA SCAN  Completed  . Hepatitis C Screening  Completed  . PNA vac Low Risk Adult  Completed    Cancer Screenings: Lung: Low Dose CT Chest recommended if Age 65-80 years, 30 pack-year  currently smoking OR have quit w/in 15 years. Patient does not qualify. Breast:  Up to date on Mammogram: No, wants to discuss with provider first   Bone Density/Dexa: completed 08/22/2013 Colorectal: wants to discuss with provider first  Additional Screenings:  Hepatitis C Screening: 07/16/2016     Plan:    Patient will continue doing cardio, yoga and weight lifting 3-4 days a week for about 1 hour.    I have personally reviewed and noted the following in the patient's chart:   . Medical and social history . Use of alcohol, tobacco or illicit drugs  . Current medications and supplements . Functional ability and status . Nutritional status . Physical activity . Advanced directives . List of other physicians . Hospitalizations, surgeries, and ER visits in previous 12 months . Vitals . Screenings to include cognitive, depression, and falls . Referrals and appointments  In addition, I have reviewed and discussed with patient certain preventive  protocols, quality metrics, and best practice recommendations. A written personalized care plan for preventive services as well as general preventive health recommendations were provided to patient.     Andrez Grime, LPN  [id number]

## 2019-08-18 NOTE — Progress Notes (Signed)
PCP notes:  Health Maintenance: Colonoscopy- wants to discuss with provider Mammogram- wants to discuss with provider   Abnormal Screenings: none   Patient concerns: Left ear stopped up   Nurse concerns: none   Next PCP appt.: 08/22/2019 @ 8:30 am

## 2019-08-22 ENCOUNTER — Other Ambulatory Visit: Payer: Self-pay

## 2019-08-22 ENCOUNTER — Encounter: Payer: Self-pay | Admitting: Family Medicine

## 2019-08-22 ENCOUNTER — Ambulatory Visit (INDEPENDENT_AMBULATORY_CARE_PROVIDER_SITE_OTHER): Payer: Medicare HMO | Admitting: Family Medicine

## 2019-08-22 ENCOUNTER — Other Ambulatory Visit: Payer: Self-pay | Admitting: Family Medicine

## 2019-08-22 DIAGNOSIS — Z7189 Other specified counseling: Secondary | ICD-10-CM

## 2019-08-22 DIAGNOSIS — R339 Retention of urine, unspecified: Secondary | ICD-10-CM

## 2019-08-22 DIAGNOSIS — M545 Low back pain, unspecified: Secondary | ICD-10-CM

## 2019-08-22 DIAGNOSIS — E785 Hyperlipidemia, unspecified: Secondary | ICD-10-CM

## 2019-08-22 DIAGNOSIS — G43109 Migraine with aura, not intractable, without status migrainosus: Secondary | ICD-10-CM | POA: Diagnosis not present

## 2019-08-22 DIAGNOSIS — H6120 Impacted cerumen, unspecified ear: Secondary | ICD-10-CM

## 2019-08-22 DIAGNOSIS — Z1231 Encounter for screening mammogram for malignant neoplasm of breast: Secondary | ICD-10-CM

## 2019-08-22 DIAGNOSIS — Z0000 Encounter for general adult medical examination without abnormal findings: Secondary | ICD-10-CM

## 2019-08-22 MED ORDER — MELOXICAM 15 MG PO TABS: 15.0000 mg | ORAL_TABLET | Freq: Every day | ORAL | 3 refills | Status: DC

## 2019-08-22 NOTE — Patient Instructions (Addendum)
You can call for a mammogram at [facility].  [place]  [email or web address]  Update me as needed.  Take care.  Glad to see you. Thanks for your effort.

## 2019-08-22 NOTE — Progress Notes (Signed)
This visit occurred during the SARS-CoV-2 public health emergency.  Safety protocols were in place, including screening questions prior to the visit, additional usage of staff PPE, and extensive cleaning of exam room while observing appropriate contact time as indicated for disinfecting solutions.  Hip/lower back area pain, s/p PT, still doing HEP.  Still taking meloxicam and that helps with pain.  No ADE on med.  Cr wnl.    Prev urinary retention resolved, no sx in the meantime.  Prev urology eval d/w pt.   No recent migraines but she has rare visual aura w/o migraine, d/w pt.  She'll update me as needed especially if she has a change in symptoms or more frequent episodes.  Mammogram 2019- she'll call about follow up.   DXA d/w pt.  Defer for now given her hx.  She wanted to defer.  She had flu reaction prev. D/w pt. Reasonable to defer.  Other vaccines d/w pt.  shingrix d/w pt.   covid vaccine d/w pt.   Colonoscopy d/w pt.  she wanted to wait until she was vaccinated.   Pap not due.  Living will d/w pt. Husband would be designated if she were incapacitated.  Diet and exercise d/w pt.    She wanted her left ear checked.  She thought she could have wax in her canal.  PMH and SH reviewed  ROS: Per HPI unless specifically indicated in ROS section   Meds, vitals, and allergies reviewed.   GEN: nad, alert and oriented HEENT: ncat, right ear canal with minimal amount of nonobstructive wax.  Left ear canal occluded with wax.  Large amount easily removed with curette.  There was some residual after irrigation but the tympanic membrane was visible.  Procedure done without trauma. NECK: supple w/o LA CV: rrr PULM: ctab, no inc wob ABD: soft, +bs EXT: no edema SKIN: no acute rash  At least 30 minutes were devoted to patient care in this encounter (this can potentially include time spent reviewing the patient's file/history, interviewing and examining the patient, counseling/reviewing  plan with patient, ordering referrals, ordering tests, reviewing relevant laboratory or x-ray data, and documenting the encounter).

## 2019-08-23 DIAGNOSIS — M545 Low back pain, unspecified: Secondary | ICD-10-CM | POA: Insufficient documentation

## 2019-08-23 DIAGNOSIS — H6120 Impacted cerumen, unspecified ear: Secondary | ICD-10-CM | POA: Insufficient documentation

## 2019-08-23 NOTE — Assessment & Plan Note (Signed)
Mammogram 2019- she'll call about follow up.   DXA d/w pt.  Defer for now given her hx.  She wanted to defer.  She had flu reaction prev. D/w pt. Reasonable to defer.  Other vaccines d/w pt.  shingrix d/w pt.   covid vaccine d/w pt.   Colonoscopy d/w pt.  she wanted to wait until she was vaccinated.   Pap not due.  Living will d/w pt. Husband would be designated if she were incapacitated.  Diet and exercise d/w pt.

## 2019-08-23 NOTE — Assessment & Plan Note (Signed)
Living will d/w pt. Husband would be designated if she were incapacitated.

## 2019-08-23 NOTE — Assessment & Plan Note (Signed)
We talked about deferring a statin.  She has a healthy diet and exercise with good functional capacity.  She was not enthused about taking extra medication and her HDL is not low.

## 2019-08-23 NOTE — Assessment & Plan Note (Signed)
Hip/lower back area pain, s/p PT, still doing HEP.  Still taking meloxicam and that helps with pain.  No ADE on med.  Cr wnl.   Continue meloxicam as is.  Routine cautions given to patient.

## 2019-08-23 NOTE — Assessment & Plan Note (Signed)
No recent migraines headache but she has rare visual aura w/o migraine otherwise, d/w pt.  She'll update me as needed especially if she has a change in symptoms or more frequent episodes.

## 2019-08-23 NOTE — Assessment & Plan Note (Signed)
Left ear canal occluded with wax.  Large amount easily removed with curette.  There was some residual after irrigation but the tympanic membrane was visible.  Procedure done without trauma.  She can continue irrigation at home.  Update me as needed.  Her hearing improved when the initial portion of wax was removed.

## 2019-09-14 ENCOUNTER — Ambulatory Visit
Admission: RE | Admit: 2019-09-14 | Discharge: 2019-09-14 | Disposition: A | Discharge status: Home / Self Care | Payer: Medicare HMO | Source: Ambulatory Visit | Attending: Family Medicine | Admitting: Family Medicine

## 2019-09-14 DIAGNOSIS — Z1231 Encounter for screening mammogram for malignant neoplasm of breast: Secondary | ICD-10-CM | POA: Diagnosis not present

## 2019-09-14 IMAGING — MG DIGITAL SCREENING BILAT W/ TOMO W/ CAD
8 series · 8 of 24 positions shown · non-contrast
Comparison: Previous exam(s).

CLINICAL DATA: Screening.

EXAM:
DIGITAL SCREENING BILATERAL MAMMOGRAM WITH TOMO AND CAD

[R MLO synth-2D]
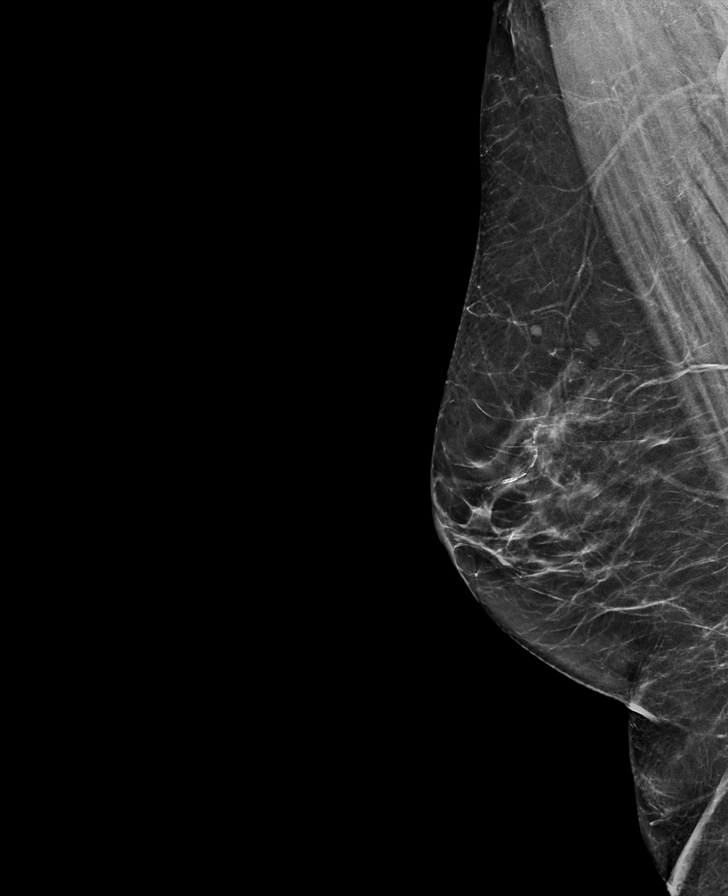

[L MLO synth-2D]
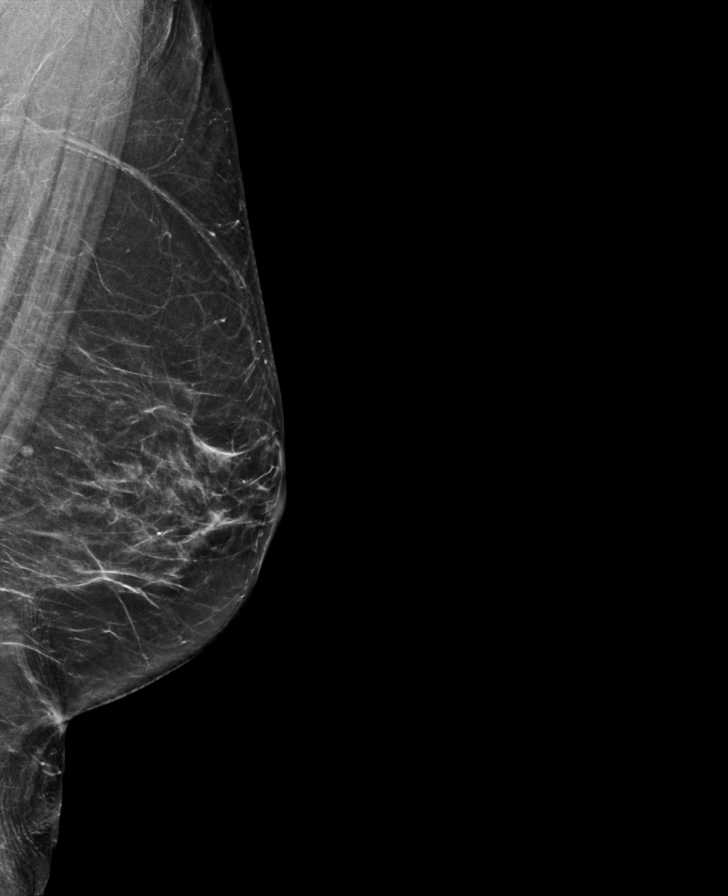

[L CC synth-2D]
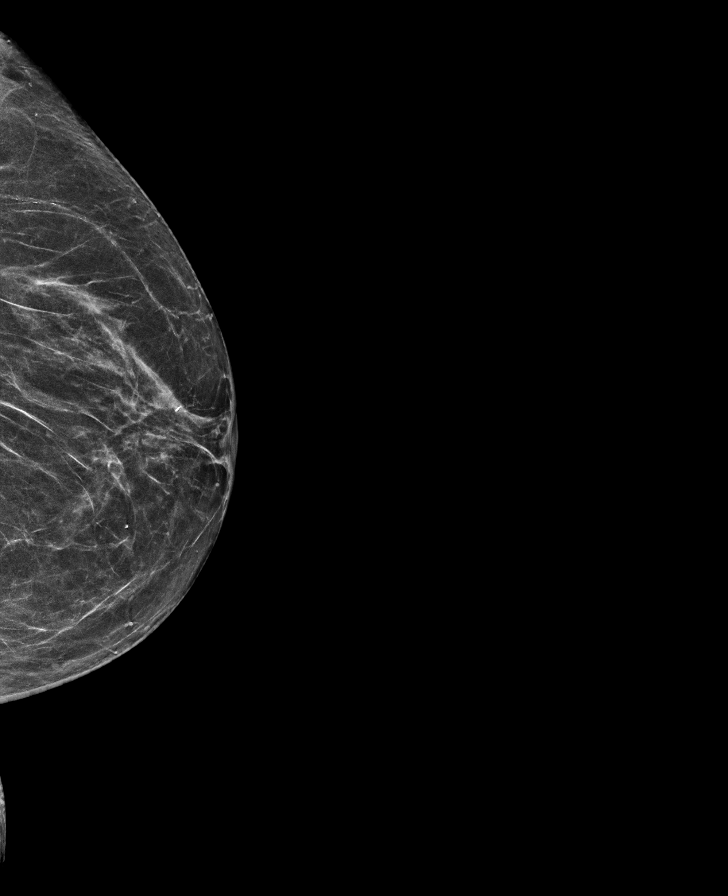

[R CC synth-2D]
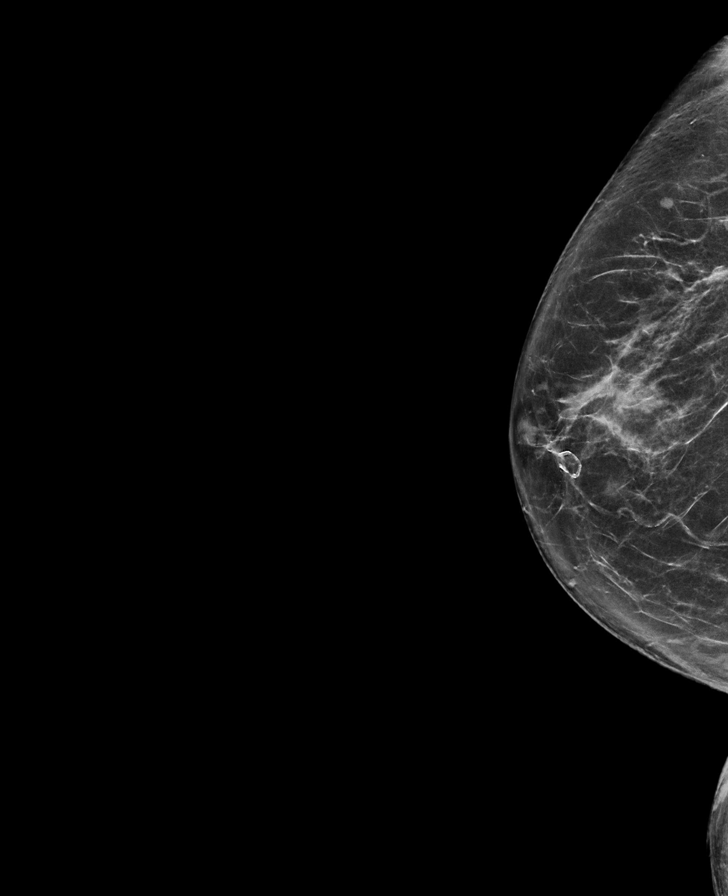

[L MLO tomo · tomo slice 35/70.0]
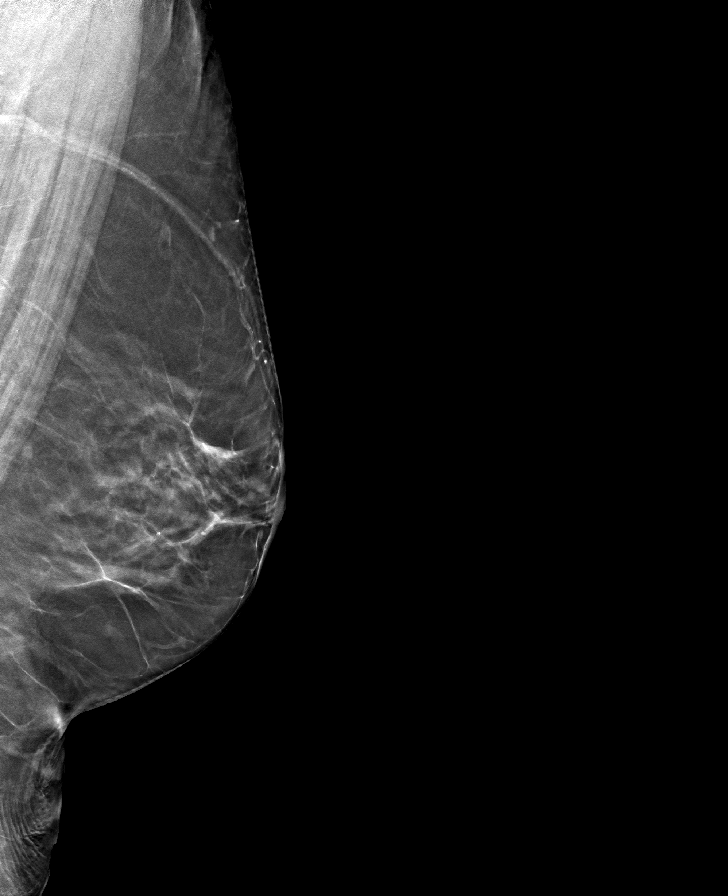

[R CC tomo · tomo slice 37/73.0]
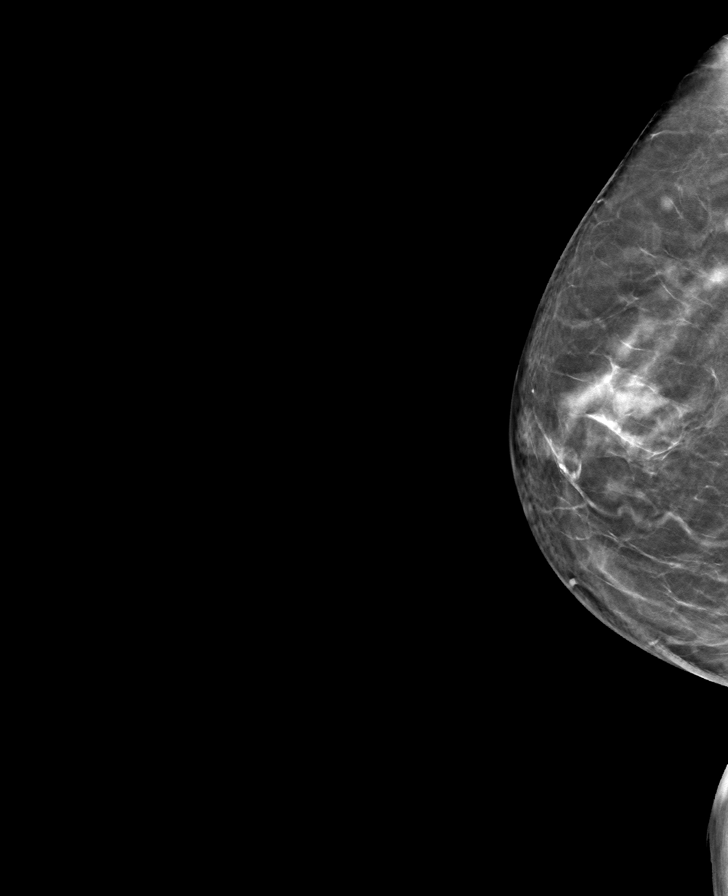

[L CC tomo · tomo slice 31/60.0]
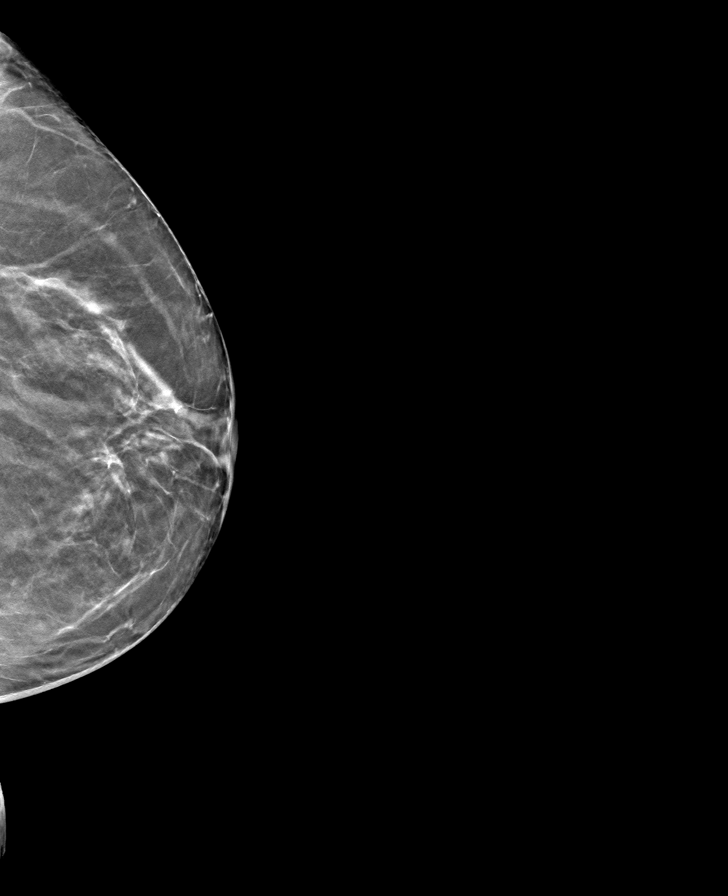

[R MLO tomo · tomo slice 33/64.0]
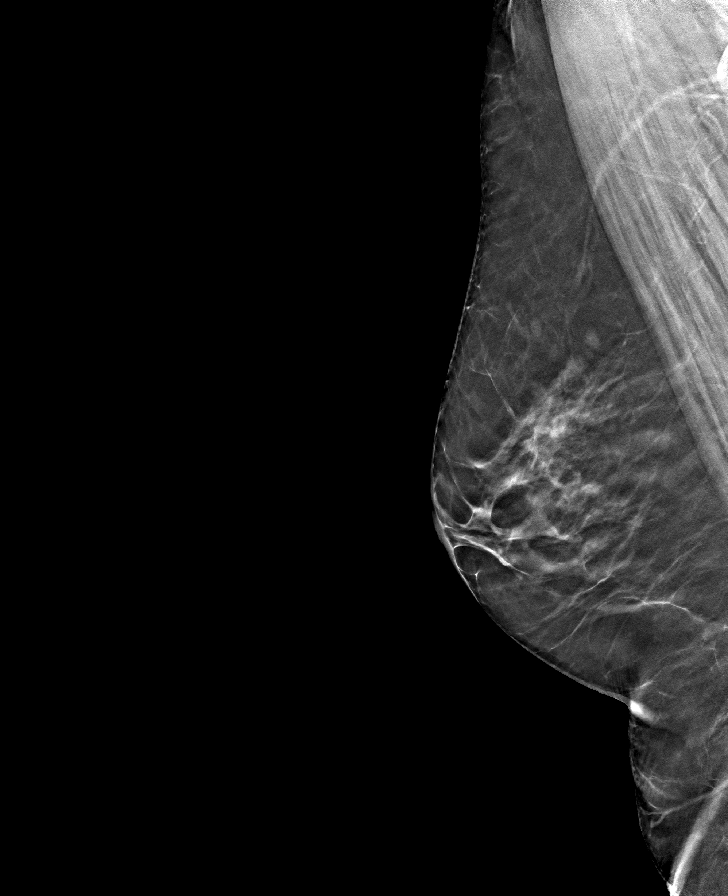

[8 of 24 positions shown; findings below may reference images not displayed]

ACR Breast Density Category b: There are scattered areas of
fibroglandular density.
FINDINGS: There are no findings suspicious for malignancy. Images were
processed with CAD.
IMPRESSION: No mammographic evidence of malignancy. A result letter of this
screening mammogram will be mailed directly to the patient.

RECOMMENDATION:
Screening mammogram in one year. (Code:[TQ])

BI-RADS CATEGORY  1: Negative.

## 2019-11-17 DIAGNOSIS — Z85828 Personal history of other malignant neoplasm of skin: Secondary | ICD-10-CM | POA: Diagnosis not present

## 2019-11-17 DIAGNOSIS — D2272 Melanocytic nevi of left lower limb, including hip: Secondary | ICD-10-CM | POA: Diagnosis not present

## 2019-11-17 DIAGNOSIS — D2261 Melanocytic nevi of right upper limb, including shoulder: Secondary | ICD-10-CM | POA: Diagnosis not present

## 2019-11-17 DIAGNOSIS — D225 Melanocytic nevi of trunk: Secondary | ICD-10-CM | POA: Diagnosis not present

## 2019-11-17 DIAGNOSIS — D2262 Melanocytic nevi of left upper limb, including shoulder: Secondary | ICD-10-CM | POA: Diagnosis not present

## 2020-02-05 ENCOUNTER — Ambulatory Visit: Payer: Medicare HMO | Admitting: Family Medicine

## 2020-02-05 ENCOUNTER — Telehealth: Payer: Self-pay

## 2020-02-05 NOTE — Telephone Encounter (Signed)
[facility] - Client TELEPHONE ADVICE RECORD [facility] Patient Name: Desiree Ortega Gender: Female DOB: 24-May-1946 Age: 74 Y 2 M 7 D Return Phone Number: [phone] (Primary) Address: City/State/Zip: [place] [address] Client [facility] - Client Client Site [facility] Day Physician Renford Dills - MD Contact Type Call Who Is Calling Patient / Member / Family / Caregiver Call Type Triage / Clinical Relationship To Patient Self Return Phone Number [phone] (Primary) Chief Complaint Headache Reason for Call Symptomatic / Request for [place] states that she has had a migraine for five days and she has been traveling. She cannot schedule unless she speaks to an RN first about her symptoms. Translation No Nurse Assessment Nurse: Zenia Resides, RN, Diane Date/Time Eilene Ghazi Time): 02/05/2020 9:19:07 AM Confirm and document reason for call. If symptomatic, describe symptoms. ---Caller states that she has had a migraine for five days. Hx of Migraines. Pt. has tried OTC med and not working. Pain started up the back of her neck and came around to her face and eyes. Sinuses are also painful. Pounds when she moves it. Pt. has a little nausea. No numbness or weakness. Has the patient had close contact with a person known or suspected to have the novel coronavirus illness OR traveled / lives in area with major community spread (including international travel) in the last 14 days from the onset of symptoms? * If Asymptomatic, screen for exposure and travel within the last 14 days. ---No Does the patient have any new or worsening symptoms? ---Yes Will a triage be completed? ---Yes Related visit to physician within the last 2 weeks? ---No Does the PT have any chronic conditions? (i.e. diabetes, asthma, this includes High risk factors for pregnancy, etc.) ---Yes List chronic  conditions. ---Migraines Is this a behavioral health or substance abuse call? ---No Guidelines Guideline Title Affirmed Question Affirmed Notes Nurse Date/Time Eilene Ghazi Time) Headache [1] SEVERE headache (e.g., excruciating) AND Zenia Resides, RN, Diane 02/05/2020 9:21:47 AM PLEASE NOTE: All timestamps contained within this report are represented as [place] Standard Time. CONFIDENTIALTY NOTICE: This fax transmission is intended only for the addressee. It contains information that is legally privileged, confidential or otherwise protected from use or disclosure. If you are not the intended recipient, you are strictly prohibited from reviewing, disclosing, copying using or disseminating any of this information or taking any action in reliance on or regarding this information. If you have received this fax in error, please notify us immediately by telephone so that we can arrange for its return to [place]. Phone: [phone], Toll-Free: [phone], Fax: [phone] Page: 2 of 2 Call Id: [id number] Guidelines Guideline Title Affirmed Question Affirmed Notes Nurse Date/Time Eilene Ghazi Time) [2] "worst headache" of life Disp. Time Eilene Ghazi Time) Disposition Final User 02/05/2020 9:03:28 AM Attempt made - message left Zenia Resides, RN, Diane 02/05/2020 9:24:48 AM Go to ED Now (or PCP triage) Yes Zenia Resides, RN, Diane Caller Disagree/Comply Disagree Caller Understands Yes PreDisposition InappropriateToAsk Care Advice Given Per Guideline GO TO ED NOW (OR PCP TRIAGE): * IF NO PCP (PRIMARY CARE PROVIDER) SECOND-LEVEL TRIAGE: You need to be seen within the next hour. Go to the [facility] at _____________ [facility] as soon as you can. ANOTHER ADULT SHOULD DRIVE: * It is better and safer if another adult drives instead of you. CARE ADVICE given per Headache (Adult) guideline. Comments User: Gloris Ham Date/Time Eilene Ghazi Time): 02/05/2020 9:11:25 AM [facility]: Alerted RN. Referrals GO TO FACILITY  UNDECIDED

## 2020-02-05 NOTE — Telephone Encounter (Signed)
Diane nurse with access nurse called and requested cb with pt; pt has severe migraine for 5 days and hurting across forehead now. Pt was advised by access nurse to go to ED and pt said she wants the same shot we gave her 5 yrs ago at our office. Pt had annual exam on 08/22/19.I spoke with pt;pt has hx of migraine h/a but pt has not had a migraine in 5 yrs. Pt has pain in head; pain started in back of head and today pain is in eyes and forehead. Pt could not turn head 2 days ago with stiffness in back of neck but today can turn her head. If leans over causes head to pound. Now pain level is 7. Pt said she can function but the pain is consistent and causes pt to be nauseated. No vomiting. Pt has generalized weakness from hips down. Had lost her taste 01/31/20 and 02/01/20 but now pt can taste again; attributed lost of taste to pain in face. Pt has no appetite. No other covid symptoms; pt was out of state to [place] from 01/31/20 -02/04/20. Pt did not wear a mask while traveling or when in [place]. Pt did try to social distance. Pt has had covid vaccine.  Pt said even though she did not wear a mask she has been vaccinated and she knows she does not have covid. I explained to pt that due to her symptoms(H/A,weakness,and lost of taste)with traveling with no mask even though pt was vaccinated for covid and the fact we have no available appts at [facility] today I would recommend either ED or UC for eval. Pt said that I am refusing to treat pt; I tried to explain that I am not refusing to treat pt and putting aside the covid issue [facility] does not have any available appts today. Pt said that 5 yrs ago she was promised if she had another H/A like that that we would give her another shot;pt again said "that I am refusing to treat me"and I was trying to explain that I was not refusing to see the pt but we do not have any available appts today and pt yelled" you are refusing to treat me" and hung up the phone. FYI to Dr Damita Dunnings as PCP and Larene Beach  RN.

## 2020-02-05 NOTE — Telephone Encounter (Signed)
Please see what you can do here.  I am not in the office today to see patient.  She could have covid given the loss of taste.   Setting all that aside, (even if no concern for covid and if I were in clinic) ER is still likely the best option here and a reasonable rec.  Thanks.

## 2020-02-06 ENCOUNTER — Other Ambulatory Visit: Payer: Self-pay

## 2020-02-06 ENCOUNTER — Ambulatory Visit (INDEPENDENT_AMBULATORY_CARE_PROVIDER_SITE_OTHER): Payer: Medicare HMO

## 2020-02-06 ENCOUNTER — Encounter: Payer: Self-pay | Admitting: Family Medicine

## 2020-02-06 DIAGNOSIS — G43909 Migraine, unspecified, not intractable, without status migrainosus: Secondary | ICD-10-CM

## 2020-02-06 DIAGNOSIS — G43109 Migraine with aura, not intractable, without status migrainosus: Secondary | ICD-10-CM | POA: Diagnosis not present

## 2020-02-06 MED ORDER — METHYLPREDNISOLONE ACETATE 80 MG/ML IJ SUSP
80.0000 mg | Freq: Once | INTRAMUSCULAR | Status: AC
  Administered 2020-02-06: 80 mg via INTRAMUSCULAR

## 2020-02-06 NOTE — Telephone Encounter (Signed)
Pt reports she had to go out of town due to her older sister, 63 y old, having a bowel resection and her brother in law had a recent stroke and could not take care of her so she did not have a choice. She is currently on day 6 of a migraine she has been "keeping the edge off" with tylenol, ibuprofen and OTC migraine medication. Pt denies any aura currently or any other symptoms.  She cannot get rid of the migraine though. She said she was not very polite on the phone yesterday due to not being able to be seen in the office because she had a "HA" and because of covid. She apologized for not being polite. Pt reports she had an injection at this office about 5-6 years ago that worked for her for a long time that she thinks was a steroid and she just wants to know if she can get that injection again. Advised pt there are currently no apts available but this nurse will discuss with PCP and this nurse will f/u with her. Pt reports right now the migraine is tolerable and she is able to function but really wants to get rid of it and would like some help if possible. Advised this office would f/u but if anything would change to contact the office. Pt verbalized understanding.   Pt had apt with [facility] on 03/28/2014 where she received depo-medrol, 80mg .

## 2020-02-06 NOTE — Progress Notes (Signed)
Per orders of Dr. Damita Dunnings, injection of Depo-Medrol 80 mg given by Brenton Grills. Patient tolerated injection well.

## 2020-02-06 NOTE — Telephone Encounter (Signed)
Given the extra information, it is likely reasonable to go ahead with the depo-medrol shot.  Please advise her not to use ibuprofen or meloxicam for the next 2 days after the shot.  Thanks.   Please set up 80mg  IM today.

## 2020-02-06 NOTE — Telephone Encounter (Signed)
Advised pt of PCP msg. Made NV for 2pm today and advised if any worsening symptoms to contact office. Pt verbalized understanding.

## 2020-02-07 DIAGNOSIS — R69 Illness, unspecified: Secondary | ICD-10-CM | POA: Diagnosis not present

## 2020-08-07 ENCOUNTER — Other Ambulatory Visit: Payer: Self-pay | Admitting: Family Medicine

## 2020-08-07 DIAGNOSIS — E785 Hyperlipidemia, unspecified: Secondary | ICD-10-CM

## 2020-08-07 DIAGNOSIS — M8580 Other specified disorders of bone density and structure, unspecified site: Secondary | ICD-10-CM

## 2020-08-19 ENCOUNTER — Other Ambulatory Visit (INDEPENDENT_AMBULATORY_CARE_PROVIDER_SITE_OTHER): Payer: Medicare HMO

## 2020-08-19 ENCOUNTER — Other Ambulatory Visit: Payer: Self-pay

## 2020-08-19 DIAGNOSIS — M8580 Other specified disorders of bone density and structure, unspecified site: Secondary | ICD-10-CM

## 2020-08-19 DIAGNOSIS — E785 Hyperlipidemia, unspecified: Secondary | ICD-10-CM

## 2020-08-19 LAB — VITAMIN D 25 HYDROXY (VIT D DEFICIENCY, FRACTURES): VITD: 49.82 ng/mL (ref 30.00–100.00)

## 2020-08-19 LAB — LIPID PANEL
Cholesterol: 257 mg/dL — ABNORMAL HIGH (ref 0–200)
HDL: 59.1 mg/dL (ref >39.00)
LDL Cholesterol: 174 mg/dL — ABNORMAL HIGH (ref 0–99)
NonHDL: 197.84
Total CHOL/HDL Ratio: 4
Triglycerides: 120 mg/dL (ref 0.0–149.0)
VLDL: 24 mg/dL (ref 0.0–40.0)

## 2020-08-19 LAB — COMPREHENSIVE METABOLIC PANEL
ALT: 12 U/L (ref 0–35)
AST: 16 U/L (ref 0–37)
Albumin: 4 g/dL (ref 3.5–5.2)
Alkaline Phosphatase: 67 U/L (ref 39–117)
BUN: 23 mg/dL (ref 6–23)
CO2: 29 mEq/L (ref 19–32)
Calcium: 9.3 mg/dL (ref 8.4–10.5)
Chloride: 104 mEq/L (ref 96–112)
Creatinine, Ser: 0.64 mg/dL (ref 0.40–1.20)
GFR: 85.66 mL/min (ref >60.00)
Glucose, Bld: 98 mg/dL (ref 70–99)
Potassium: 4.1 mEq/L (ref 3.5–5.1)
Sodium: 139 mEq/L (ref 135–145)
Total Bilirubin: 0.6 mg/dL (ref 0.2–1.2)
Total Protein: 6.7 g/dL (ref 6.0–8.3)

## 2020-08-20 ENCOUNTER — Other Ambulatory Visit: Payer: Self-pay

## 2020-08-20 ENCOUNTER — Ambulatory Visit (INDEPENDENT_AMBULATORY_CARE_PROVIDER_SITE_OTHER): Payer: Medicare HMO

## 2020-08-20 DIAGNOSIS — Z0000 Encounter for general adult medical examination without abnormal findings: Secondary | ICD-10-CM | POA: Diagnosis not present

## 2020-08-20 NOTE — Progress Notes (Signed)
Subjective:   Desiree Ortega is a 75 y.o. female who presents for Medicare Annual (Subsequent) preventive examination.  Review of Systems: N/A      I connected with the patient today by telephone and verified that I am speaking with the correct person using two identifiers. Location patient: home Location nurse: work Persons participating in the telephone visit: patient, nurse.   I discussed the limitations, risks, security and privacy concerns of performing an evaluation and management service by telephone and the availability of in person appointments. I also discussed with the patient that there may be a patient responsible charge related to this service. The patient expressed understanding and verbally consented to this telephonic visit.       Cardiac Risk Factors include: advanced age (>75men, >72 women);Other (see comment), Risk factor comments: hyperlipidemia     Objective:    Today's Vitals   There is no height or weight on file to calculate BMI.  Advanced Directives 08/20/2020 08/18/2019 02/01/2019 08/12/2018 02/09/2018 02/01/2018 01/27/2018  Does Patient Have a Medical Advance Directive? Yes Yes No Yes Yes Yes Yes  Type of Paramedic of [place];Living will [place];Living will - Living will;Healthcare Power of [place];Living will [place];Living will  Does patient want to make changes to medical advance directive? - - - - No - Patient declined No - Patient declined No - Patient declined  Copy of [place] in Chart? Yes - validated most recent copy scanned in chart (See row information) Yes - validated most recent copy scanned in chart (See row information) - Yes - validated most recent copy scanned in chart (See row information) Yes Yes Yes  Would patient like information on creating a medical advance directive? - - - - - - -    Current  Medications (verified) Outpatient Encounter Medications as of 08/20/2020  Medication Sig  . acetaminophen (TYLENOL) 500 MG tablet Take 500 mg by mouth 2 (two) times daily as needed (for pain.).  Marland Kitchen Ascorbic Acid (VITAMIN C) 1000 MG tablet Take 1 tablet (1,000 mg total) by mouth daily.  Marland Kitchen b complex vitamins tablet Take 1 tablet by mouth daily.  . Cholecalciferol (VITAMIN D PO) Take by mouth.  Marland Kitchen CINNAMON PO Take 1,000 mg by mouth daily.  . Cyanocobalamin (B-12 PO) Take by mouth.  . fexofenadine (ALLEGRA) 180 MG tablet Take 1 tablet (180 mg total) by mouth as needed. Seasonal allergies from Spring to Fall  . Flaxseed, Linseed, (FLAX SEEDS PO) Take by mouth.  . Magnesium 250 MG TABS Take 250 mg by mouth daily.  . Melatonin 3 MG TABS Take 1 tablet (3 mg total) by mouth at bedtime as needed.  . meloxicam (MOBIC) 15 MG tablet Take 1 tablet (15 mg total) by mouth daily. With food.  Don't take with aleve or ibuprofen  . Multiple Vitamin (MULTIVITAMIN) capsule Take 1 capsule by mouth daily.  . Omega-3 Fatty Acids (FISH OIL PO) Take by mouth.  . Turmeric 500 MG CAPS Take by mouth.  . Ubiquinol 100 MG CAPS Take 100 mg by mouth daily.  . Zinc 50 MG CAPS Take by mouth.   No facility-administered encounter medications on file as of 08/20/2020.    Allergies (verified) Alendronate sodium, Codeine, and Influenza vaccines   History: Past Medical History:  Diagnosis Date  . Allergy   . 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 Evorn Gong  . Diverticulosis    Moderate  . Female bladder prolapse   . H/O cold sores   . History of colon polyps   . History of kidney stones   . Hyperlipidemia   . Migraine with aura   . Osteopenia    prev on fosamax for a few years then started evista at age ~18, T score improved  from -2.29/-1.96 to -1.8/-1.7 as of 06/2011, repeat DXA done 2015   Past Surgical History:  Procedure Laterality Date  . BREAST BIOPSY Left    benign  .  CATARACT EXTRACTION W/ INTRAOCULAR LENS IMPLANT Left 11/2016  . CATARACT EXTRACTION W/ INTRAOCULAR LENS IMPLANT Right 12/22/2016  . COLONOSCOPY  08/15/2013  . cyst on back     2 times  . cyst on eyelid     right eye  . CYSTOCELE REPAIR N/A 02/01/2018   Procedure: ANTERIOR REPAIR (CYSTOCELE);  Surgeon: Bjorn Loser, MD;  Location: [facility];  Service: Urology;  Laterality: N/A;  . CYSTOSCOPY N/A 02/01/2018   Procedure: cystoscopy;  Surgeon: Bjorn Loser, MD;  Location: [facility];  Service: Urology;  Laterality: N/A;  . LAPAROSCOPIC VAGINAL HYSTERECTOMY WITH SALPINGO OOPHORECTOMY Bilateral 02/01/2018   Procedure: LAPAROSCOPIC ASSISTED VAGINAL HYSTERECTOMY WITH BILATERAL SALPINGO OOPHORECTOMY;  Surgeon: Servando Salina, MD;  Location: [facility];  Service: Gynecology;  Laterality: Bilateral;  . SQUAMOUS CELL CARCINOMA EXCISION     on back/removed 2 times/and thigh  . SQUAMOUS CELL CARCINOMA EXCISION Left 07/15/2016   left lower calf  . SSC removed  1988   Birthmark removal partial 1/3  . TONSILLECTOMY  1950  . TUBAL LIGATION  1980's   Family History  Problem Relation Age of Onset  . Osteopenia Mother   . Hypertension Father   . Heart disease Father        AFIB, PVD stents LE's, pacer  . Stroke Father   . Cancer Brother        prostate, prostatectomy, radiation 5 years later, Hormone Tx  . Prostate cancer Brother   . Heart disease Brother   . Cancer Brother        prostate, prostatectomy  . Prostate cancer Brother   . Diabetes Other   . Cancer Other        Non-Hodgkin's Lymphoma  . Depression Daughter   . Alcohol abuse Neg Hx   . Drug abuse Neg Hx   . Colon cancer Neg Hx   . Breast cancer Neg Hx   . Bladder Cancer Neg Hx   . Kidney cancer Neg Hx    Social History   Socioeconomic History  . Marital status: Married    Spouse name: Not on file  . Number of children: 4  . Years of education: Not on file  . Highest education level: Not on file  Occupational History  .  Occupation: [employer]    Comment: Teaches swimming  Tobacco Use  . Smoking status: Former Smoker    Packs/day: 0.50    Years: 36.00    Pack years: 18.00    Types: Cigarettes    Quit date: 06/22/2001    Years since quitting: 19.1  . Smokeless tobacco: Never Used  Vaping Use  . Vaping Use: Never used  Substance and Sexual Activity  . Alcohol use: Yes    Alcohol/week: 2.0 standard drinks    Types: 2 Glasses of wine per week    Comment: occasionally  . Drug use: No  .  Sexual activity: Yes    Birth control/protection: Post-menopausal  Other Topics Concern  . Not on file  Social History Narrative   Married 1963, lives with husband   [employer] fan   Mother in ALF as of 2021 (pt's mother was born in [place])   4 daughters   Taught swimming prev   Social Determinants of Health   Financial Resource Strain: Low Risk   . Difficulty of Paying Living Expenses: Not hard at all  Food Insecurity: No Food Insecurity  . Worried About Charity fundraiser in the Last Year: Never true  . Ran Out of Food in the Last Year: Never true  Transportation Needs: No Transportation Needs  . Lack of Transportation (Medical): No  . Lack of Transportation (Non-Medical): No  Physical Activity: Inactive  . Days of Exercise per Week: 0 days  . Minutes of Exercise per Session: 0 min  Stress: No Stress Concern Present  . Feeling of Stress : Not at all  Social Connections: Not on file    Tobacco Counseling Counseling given: Not Answered   Clinical Intake:  Pre-visit preparation completed: Yes  Pain : No/denies pain     Nutritional Risks: None Diabetes: No  How often do you need to have someone help you when you read instructions, pamphlets, or other written materials from your doctor or pharmacy?: 1 - Never What is the last grade level you completed in school?: 12th, 5 years of courses for underwriting  Diabetic: No Nutrition Risk Assessment:  Has the patient had  any N/V/D within the last 2 months?  No  Does the patient have any non-healing wounds?  No  Has the patient had any unintentional weight loss or weight gain?  No   Diabetes:  Is the patient diabetic?  No  If diabetic, was a CBG obtained today?  N/A Did the patient bring in their glucometer from home?  N/A How often do you monitor your CBG's? N/A.   Financial Strains and Diabetes Management:  Are you having any financial strains with the device, your supplies or your medication? N/A.  Does the patient want to be seen by Chronic Care Management for management of their diabetes?  N/A Would the patient like to be referred to a Nutritionist or for Diabetic Management?  N/A   Interpreter Needed?: No  Information entered by :: CJohnson, LPN   Activities of Daily Living In your present state of health, do you have any difficulty performing the following activities: 08/20/2020  Hearing? N  Vision? N  Difficulty concentrating or making decisions? N  Walking or climbing stairs? N  Dressing or bathing? N  Doing errands, shopping? N  Preparing Food and eating ? N  Using the Toilet? N  In the past six months, have you accidently leaked urine? N  Do you have problems with loss of bowel control? N  Managing your Medications? N  Managing your Finances? N  Housekeeping or managing your Housekeeping? N  Some recent data might be hidden    Patient Care Team: Tonia Ghent, MD as PCP - General (Family Medicine) Bryson Ha, OD as Consulting Physician (Optometry) Dasher, Rayvon Char, MD as Consulting Physician (Dermatology) Altha Harm, DDS as Consulting Physician (Dentistry)  Indicate any recent Medical Services you may have received from other than [facility] providers in the past year (date may be approximate).     Assessment:   This is a routine wellness examination for Unnamed.  Hearing/Vision screen  Hearing Screening   125Hz  250Hz  500Hz  1000Hz  2000Hz  3000Hz  4000Hz  6000Hz  8000Hz    Right ear:           Left ear:           Vision Screening Comments: Patient gets annual eye exams   Dietary issues and exercise activities discussed: Current Exercise Habits: The patient does not participate in regular exercise at present, Exercise limited by: None identified  Goals    . Increase physical activity     Starting 08/12/2018, I will continue to exercise for at least 60 minutes 6 days per week.     . Patient Stated     08/18/2019, I will continue doing cardio, yoga and weight lifting 3-4 days a week for about 1 hour.     . Patient Stated     08/20/2020, I will maintain and continue medications as prescribed.       Depression Screen PHQ 2/9 Scores 08/20/2020 08/18/2019 08/12/2018 08/04/2017 07/16/2016 07/15/2015 07/12/2014  PHQ - 2 Score 0 0 0 0 0 0 0  PHQ- 9 Score 0 0 0 0 - - -    Fall Risk Fall Risk  08/20/2020 08/18/2019 08/12/2018 08/04/2017 07/16/2016  Falls in the past year? 0 0 0 No No  Number falls in past yr: 0 0 - - -  Injury with Fall? 0 0 - - -  Risk for fall due to : No Fall Risks No Fall Risks - - -  Follow up Falls evaluation completed;Falls prevention discussed Falls evaluation completed;Falls prevention discussed - - -    FALL RISK PREVENTION PERTAINING TO THE HOME:  Any stairs in or around the home? Yes  If so, are there any without handrails? No  Home free of loose throw rugs in walkways, pet beds, electrical cords, etc? Yes  Adequate lighting in your home to reduce risk of falls? Yes   ASSISTIVE DEVICES UTILIZED TO PREVENT FALLS:  Life alert? No  Use of a cane, walker or w/c? No  Grab bars in the bathroom? No  Shower chair or bench in shower? No  Elevated toilet seat or a handicapped toilet? No   TIMED UP AND GO:  Was the test performed? N/A telephone visit.    Cognitive Function: MMSE - Mini Mental State Exam 08/20/2020 08/18/2019 08/12/2018 08/04/2017 07/16/2016  Orientation to time 5 5 5 5 5   Orientation to Place 5 5 5 5 5   Registration 3 3 3 3 3    Attention/ Calculation 5 5 0 0 0  Recall 3 3 3 2 3   Recall-comments - - - unable to recall 1 of 3 words -  Language- name 2 objects - - 0 0 0  Language- repeat 1 1 1 1 1   Language- follow 3 step command - - 3 3 3   Language- read & follow direction - - 0 0 0  Write a sentence - - 0 0 0  Copy design - - 0 0 0  Total score - - 20 19 20   Mini Cog  Mini-Cog screen was completed. Maximum score is 22. A value of 0 denotes this part of the MMSE was not completed or the patient failed this part of the Mini-Cog screening.       Immunizations Immunization History  Administered Date(s) Administered  . Influenza, Seasonal, Injecte, Preservative Fre 07/01/2012  . PFIZER(Purple Top)SARS-COV-2 Vaccination 09/05/2019, 09/26/2019, 03/27/2020  . Pneumococcal Conjugate-13 07/15/2015  . Pneumococcal Polysaccharide-23 06/25/2011  . Td 11/20/1997, 06/11/2008  . Tdap 04/29/2015  TDAP status: Up to date  Flu Vaccine status: allergic  Pneumococcal vaccine status: Up to date  Covid-19 vaccine status: Completed vaccines  Qualifies for Shingles Vaccine? Yes   Zostavax completed No   Shingrix Completed?: No.    Education has been provided regarding the importance of this vaccine. Patient has been advised to call insurance company to determine out of pocket expense if they have not yet received this vaccine. Advised may also receive vaccine at local pharmacy or Health Dept. Verbalized acceptance and understanding.  Screening Tests Health Maintenance  Topic Date Due  . COLONOSCOPY (Pts 45-61yrs Insurance coverage will need to be confirmed)  08/20/2021 (Originally 08/15/2018)  . MAMMOGRAM  09/13/2020  . TETANUS/TDAP  04/28/2025  . DEXA SCAN  Completed  . COVID-19 Vaccine  Completed  . Hepatitis C Screening  Completed  . PNA vac Low Risk Adult  Completed  . HPV VACCINES  Aged Out    Health Maintenance  There are no preventive care reminders to display for this patient.  Colorectal cancer  screening: declined  Mammogram status: Completed 09/14/2019. Repeat every year  Bone Density status: declined  Lung Cancer Screening: (Low Dose CT Chest recommended if Age 42-80 years, 30 pack-year currently smoking OR have quit w/in 15years.) does not qualify.   Additional Screening:  Hepatitis C Screening: does qualify; Completed 07/16/2016  Vision Screening: Recommended annual ophthalmology exams for early detection of glaucoma and other disorders of the eye. Is the patient up to date with their annual eye exam?  Yes  Who is the provider or what is the name of the office in which the patient attends annual eye exams? Dr. Phineas Douglas If pt is not established with a provider, would they like to be referred to a provider to establish care? No .   Dental Screening: Recommended annual dental exams for proper oral hygiene  Community Resource Referral / Chronic Care Management: CRR required this visit?  No   CCM required this visit?  No      Plan:     I have personally reviewed and noted the following in the patient's chart:   . Medical and social history . Use of alcohol, tobacco or illicit drugs  . Current medications and supplements . Functional ability and status . Nutritional status . Physical activity . Advanced directives . List of other physicians . Hospitalizations, surgeries, and ER visits in previous 12 months . Vitals . Screenings to include cognitive, depression, and falls . Referrals and appointments  In addition, I have reviewed and discussed with patient certain preventive protocols, quality metrics, and best practice recommendations. A written personalized care plan for preventive services as well as general preventive health recommendations were provided to patient.   Due to this being a telephonic visit, the after visit summary with patients personalized plan was offered to patient via office or my-chart. Patient preferred to pick up at office at next visit  or via mychart.   Andrez Grime, LPN   [id number]

## 2020-08-20 NOTE — Patient Instructions (Signed)
Desiree Ortega , Thank you for taking time to come for your Medicare Wellness Visit. I appreciate your ongoing commitment to your health goals. Please review the following plan we discussed and let me know if I can assist you in the future.   Screening recommendations/referrals: Colonoscopy: declined Mammogram: Up to date, completed 09/14/2019, due 09/13/2020 Bone Density: declined Recommended yearly ophthalmology/optometry visit for glaucoma screening and checkup Recommended yearly dental visit for hygiene and checkup  Vaccinations: Influenza vaccine: allergic  Pneumococcal vaccine: Completed series Tdap vaccine: Up to date, completed 04/29/2015, due 04/2025 Shingles vaccine: due, check with your insurance regarding coverage if interested    Covid-19:Completed series  Advanced directives: copy in chart  Conditions/risks identified: hyperlipidemia   Next appointment: Follow up in one year for your annual wellness visit    Preventive Care 77 Years and Older, Female Preventive care refers to lifestyle choices and visits with your health care provider that can promote health and wellness. What does preventive care include?  A yearly physical exam. This is also called an annual well check.  Dental exams once or twice a year.  Routine eye exams. Ask your health care provider how often you should have your eyes checked.  Personal lifestyle choices, including:  Daily care of your teeth and gums.  Regular physical activity.  Eating a healthy diet.  Avoiding tobacco and drug use.  Limiting alcohol use.  Practicing safe sex.  Taking low-dose aspirin every day.  Taking vitamin and mineral supplements as recommended by your health care provider. What happens during an annual well check? The services and screenings done by your health care provider during your annual well check will depend on your age, overall health, lifestyle risk factors, and family history of disease. Counseling   Your health care provider may ask you questions about your:  Alcohol use.  Tobacco use.  Drug use.  Emotional well-being.  Home and relationship well-being.  Sexual activity.  Eating habits.  History of falls.  Memory and ability to understand (cognition).  Work and work Statistician.  Reproductive health. Screening  You may have the following tests or measurements:  Height, weight, and BMI.  Blood pressure.  Lipid and cholesterol levels. These may be checked every 5 years, or more frequently if you are over 75 years old.  Skin check.  Lung cancer screening. You may have this screening every year starting at age 35 if you have a 30-pack-year history of smoking and currently smoke or have quit within the past 15 years.  Fecal occult blood test (FOBT) of the stool. You may have this test every year starting at age 65.  Flexible sigmoidoscopy or colonoscopy. You may have a sigmoidoscopy every 5 years or a colonoscopy every 10 years starting at age 46.  Hepatitis C blood test.  Hepatitis B blood test.  Sexually transmitted disease (STD) testing.  Diabetes screening. This is done by checking your blood sugar (glucose) after you have not eaten for a while (fasting). You may have this done every 1-3 years.  Bone density scan. This is done to screen for osteoporosis. You may have this done starting at age 48.  Mammogram. This may be done every 1-2 years. Talk to your health care provider about how often you should have regular mammograms. Talk with your health care provider about your test results, treatment options, and if necessary, the need for more tests. Vaccines  Your health care provider may recommend certain vaccines, such as:  Influenza vaccine. This is  recommended every year.  Tetanus, diphtheria, and acellular pertussis (Tdap, Td) vaccine. You may need a Td booster every 10 years.  Zoster vaccine. You may need this after age 19.  Pneumococcal 13-valent  conjugate (PCV13) vaccine. One dose is recommended after age 52.  Pneumococcal polysaccharide (PPSV23) vaccine. One dose is recommended after age 32. Talk to your health care provider about which screenings and vaccines you need and how often you need them. This information is not intended to replace advice given to you by your health care provider. Make sure you discuss any questions you have with your health care provider. Document Released: 07/05/2015 Document Revised: 02/26/2016 Document Reviewed: 04/09/2015 Elsevier Interactive Patient Education  2017 [place] Prevention in the Home Falls can cause injuries. They can happen to people of all ages. There are many things you can do to make your home safe and to help prevent falls. What can I do on the outside of my home?  Regularly fix the edges of walkways and driveways and fix any cracks.  Remove anything that might make you trip as you walk through a door, such as a raised step or threshold.  Trim any bushes or trees on the path to your home.  Use bright outdoor lighting.  Clear any walking paths of anything that might make someone trip, such as rocks or tools.  Regularly check to see if handrails are loose or broken. Make sure that both sides of any steps have handrails.  Any raised decks and porches should have guardrails on the edges.  Have any leaves, snow, or ice cleared regularly.  Use sand or salt on walking paths during winter.  Clean up any spills in your garage right away. This includes oil or grease spills. What can I do in the bathroom?  Use night lights.  Install grab bars by the toilet and in the tub and shower. Do not use towel bars as grab bars.  Use non-skid mats or decals in the tub or shower.  If you need to sit down in the shower, use a plastic, non-slip stool.  Keep the floor dry. Clean up any water that spills on the floor as soon as it happens.  Remove soap buildup in the tub or  shower regularly.  Attach bath mats securely with double-sided non-slip rug tape.  Do not have throw rugs and other things on the floor that can make you trip. What can I do in the bedroom?  Use night lights.  Make sure that you have a light by your bed that is easy to reach.  Do not use any sheets or blankets that are too big for your bed. They should not hang down onto the floor.  Have a firm chair that has side arms. You can use this for support while you get dressed.  Do not have throw rugs and other things on the floor that can make you trip. What can I do in the kitchen?  Clean up any spills right away.  Avoid walking on wet floors.  Keep items that you use a lot in easy-to-reach places.  If you need to reach something above you, use a strong step stool that has a grab bar.  Keep electrical cords out of the way.  Do not use floor polish or wax that makes floors slippery. If you must use wax, use non-skid floor wax.  Do not have throw rugs and other things on the floor that can make you trip.  What can I do with my stairs?  Do not leave any items on the stairs.  Make sure that there are handrails on both sides of the stairs and use them. Fix handrails that are broken or loose. Make sure that handrails are as long as the stairways.  Check any carpeting to make sure that it is firmly attached to the stairs. Fix any carpet that is loose or worn.  Avoid having throw rugs at the top or bottom of the stairs. If you do have throw rugs, attach them to the floor with carpet tape.  Make sure that you have a light switch at the top of the stairs and the bottom of the stairs. If you do not have them, ask someone to add them for you. What else can I do to help prevent falls?  Wear shoes that:  Do not have high heels.  Have rubber bottoms.  Are comfortable and fit you well.  Are closed at the toe. Do not wear sandals.  If you use a stepladder:  Make sure that it is fully  opened. Do not climb a closed stepladder.  Make sure that both sides of the stepladder are locked into place.  Ask someone to hold it for you, if possible.  Clearly mark and make sure that you can see:  Any grab bars or handrails.  First and last steps.  Where the edge of each step is.  Use tools that help you move around (mobility aids) if they are needed. These include:  Canes.  Walkers.  Scooters.  Crutches.  Turn on the lights when you go into a dark area. Replace any light bulbs as soon as they burn out.  Set up your furniture so you have a clear path. Avoid moving your furniture around.  If any of your floors are uneven, fix them.  If there are any pets around you, be aware of where they are.  Review your medicines with your doctor. Some medicines can make you feel dizzy. This can increase your chance of falling. Ask your doctor what other things that you can do to help prevent falls. This information is not intended to replace advice given to you by your health care provider. Make sure you discuss any questions you have with your health care provider. Document Released: 04/04/2009 Document Revised: 11/14/2015 Document Reviewed: 07/13/2014 Elsevier Interactive Patient Education  2017 [employer].

## 2020-08-20 NOTE — Progress Notes (Signed)
PCP notes:  Health Maintenance: Colonoscopy- declined Dexa- declined   Abnormal Screenings: none   Patient concerns: none   Nurse concerns: none   Next PCP appt.: 08/23/2020 @ 9 am

## 2020-08-23 ENCOUNTER — Encounter: Payer: Self-pay | Admitting: Family Medicine

## 2020-08-23 ENCOUNTER — Other Ambulatory Visit: Payer: Self-pay

## 2020-08-23 ENCOUNTER — Ambulatory Visit (INDEPENDENT_AMBULATORY_CARE_PROVIDER_SITE_OTHER): Payer: Medicare HMO | Admitting: Family Medicine

## 2020-08-23 VITALS — BP 120/82 | HR 70 | Temp 97.5°F | Ht 66.0 in | Wt 159.0 lb

## 2020-08-23 DIAGNOSIS — E785 Hyperlipidemia, unspecified: Secondary | ICD-10-CM

## 2020-08-23 DIAGNOSIS — Z0000 Encounter for general adult medical examination without abnormal findings: Secondary | ICD-10-CM

## 2020-08-23 DIAGNOSIS — Z7189 Other specified counseling: Secondary | ICD-10-CM

## 2020-08-23 MED ORDER — RED YEAST RICE EXTRACT 600 MG PO CAPS: 600.0000 mg | ORAL_CAPSULE | Freq: Every day | ORAL | Status: DC

## 2020-08-23 NOTE — Progress Notes (Signed)
This visit occurred during the SARS-CoV-2 public health emergency.  Safety protocols were in place, including screening questions prior to the visit, additional usage of staff PPE, and extensive cleaning of exam room while observing appropriate contact time as indicated for disinfecting solutions.  Elevated Cholesterol: Not on statin. Diet and exercise d/w pt. discussed with patient about trying red yeast rice.  We can always recheck her lipids later on and consider statin later on.  See after visit summary.  Living will d/w pt. Husband would be designated if she were incapacitated She has been treated with evista for many years in the past and her fx risk is likely lower than it would be at that same score (-2.3) if she hadn't been treated prev. d/w pt about options. She didn't want further treatment now so it would be reasonable to defer the test. Mammogram 2021 Colonoscopy d/w pt.  She declined.  D/w pt.  I asked her to consider.   Diet and exercise d/w pt.  Weight up in the meantime, after hysterectomy. She is exercising at home.    Meloxicam use most days but not every day.  She tries to limit use.    Meds, vitals, and allergies reviewed.   ROS: Per HPI unless specifically indicated in ROS section   GEN: nad, alert and oriented HEENT: ncat NECK: supple w/o LA CV: rrr PULM: ctab, no inc wob ABD: soft, +bs EXT: no edema SKIN: no acute rash  The 10-year ASCVD risk score Mikey Bussing DC Jr., et al., 2013) is: 16.1%   Values used to calculate the score:     Age: 75 years     Sex: Female     Is Non-Hispanic African American: No     Diabetic: No     Tobacco smoker: No     Systolic Blood Pressure: 308 mmHg     Is BP treated: No     HDL Cholesterol: 59.1 mg/dL     Total Cholesterol: 257 mg/dL   32 minutes were devoted to patient care in this encounter (this includes time spent reviewing the patient's file/history, interviewing and examining the patient, counseling/reviewing plan with  patient).

## 2020-08-23 NOTE — Patient Instructions (Signed)
Try RYR and see if you can tolerate that.   If so, then recheck labs in about 3 months at fasting lab visit.  Take care.  Glad to see you.

## 2020-08-24 NOTE — Assessment & Plan Note (Signed)
Living will d/w pt. Husband would be designated if she were incapacitated

## 2020-08-24 NOTE — Assessment & Plan Note (Signed)
Living will d/w pt. Husband would be designated if she were incapacitated She has been treated with evista for many years in the past and her fx risk is likely lower than it would be at that same score (-2.3) if she hadn't been treated prev. d/w pt about options. She didn't want further treatment now so it would be reasonable to defer the test. Mammogram 2021 Colonoscopy d/w pt.  She declined.  D/w pt.  I asked her to consider.   Diet and exercise d/w pt.  Weight up in the meantime, after hysterectomy. She is exercising at home.

## 2020-08-24 NOTE — Assessment & Plan Note (Signed)
Not on statin. Diet and exercise d/w pt. discussed with patient about trying red yeast rice.  We can always recheck her lipids later on and consider statin later on.  See after visit summary.

## 2020-09-02 DIAGNOSIS — M545 Low back pain, unspecified: Secondary | ICD-10-CM | POA: Diagnosis not present

## 2020-09-02 DIAGNOSIS — M4316 Spondylolisthesis, lumbar region: Secondary | ICD-10-CM | POA: Diagnosis not present

## 2020-09-02 DIAGNOSIS — M546 Pain in thoracic spine: Secondary | ICD-10-CM | POA: Diagnosis not present

## 2020-09-02 DIAGNOSIS — R200 Anesthesia of skin: Secondary | ICD-10-CM | POA: Diagnosis not present

## 2020-09-02 DIAGNOSIS — R202 Paresthesia of skin: Secondary | ICD-10-CM | POA: Diagnosis not present

## 2020-11-19 DIAGNOSIS — L309 Dermatitis, unspecified: Secondary | ICD-10-CM | POA: Diagnosis not present

## 2020-11-19 DIAGNOSIS — L821 Other seborrheic keratosis: Secondary | ICD-10-CM | POA: Diagnosis not present

## 2020-11-19 DIAGNOSIS — D2262 Melanocytic nevi of left upper limb, including shoulder: Secondary | ICD-10-CM | POA: Diagnosis not present

## 2020-11-19 DIAGNOSIS — D2272 Melanocytic nevi of left lower limb, including hip: Secondary | ICD-10-CM | POA: Diagnosis not present

## 2020-11-19 DIAGNOSIS — D225 Melanocytic nevi of trunk: Secondary | ICD-10-CM | POA: Diagnosis not present

## 2020-11-19 DIAGNOSIS — Z85828 Personal history of other malignant neoplasm of skin: Secondary | ICD-10-CM | POA: Diagnosis not present

## 2020-11-19 DIAGNOSIS — D485 Neoplasm of uncertain behavior of skin: Secondary | ICD-10-CM | POA: Diagnosis not present

## 2020-11-19 DIAGNOSIS — D2362 Other benign neoplasm of skin of left upper limb, including shoulder: Secondary | ICD-10-CM | POA: Diagnosis not present

## 2020-12-09 ENCOUNTER — Telehealth (INDEPENDENT_AMBULATORY_CARE_PROVIDER_SITE_OTHER): Payer: Medicare HMO | Admitting: Family Medicine

## 2020-12-09 ENCOUNTER — Other Ambulatory Visit (INDEPENDENT_AMBULATORY_CARE_PROVIDER_SITE_OTHER): Payer: Medicare HMO

## 2020-12-09 ENCOUNTER — Encounter: Payer: Self-pay | Admitting: Family Medicine

## 2020-12-09 ENCOUNTER — Other Ambulatory Visit: Payer: Self-pay | Admitting: Family Medicine

## 2020-12-09 VITALS — Temp 97.4°F | Ht 66.0 in

## 2020-12-09 DIAGNOSIS — R059 Cough, unspecified: Secondary | ICD-10-CM | POA: Diagnosis not present

## 2020-12-09 DIAGNOSIS — Z20822 Contact with and (suspected) exposure to covid-19: Secondary | ICD-10-CM

## 2020-12-09 LAB — POC INFLUENZA A&B (BINAX/QUICKVUE)
Influenza A, POC: NEGATIVE
Influenza B, POC: NEGATIVE

## 2020-12-09 NOTE — Progress Notes (Signed)
      Deaja Rizo T. Alya Smaltz, MD Primary Care and Sports Medicine [facility] [place], [address] Phone: [phone]  FAX: [phone]  MAZAL EBEY - 75 y.o. female  MRN [id number]  Date of Birth: Nov 08, 1945  Visit Date: 12/09/2020  PCP: Tonia Ghent, MD  Referred by: Tonia Ghent, MD  Virtual Visit via Video Note:  I connected with  [name] on 12/09/2020  9:00 AM EDT by a video enabled telemedicine application and verified that I am speaking with the correct person using two identifiers.   Location patient: home computer, tablet, or smartphone Location provider: work or home office Consent: Verbal consent directly obtained from [name]. Persons participating in the virtual visit: patient, provider  I discussed the limitations of evaluation and management by telemedicine and the availability of in person appointments. The patient expressed understanding and agreed to proceed.  Chief Complaint  Patient presents with   Fatigue   Cough    History of Present Illness:  Has a sore throat for about a week with a cough. Last Wednesday woke up and had a headache.  Treated herself with cough drops and on Thursday, and now her cough is lingering.  No HA, no other symptoms.   No fever.   Has not done a covid test.  Immunization History  Administered Date(s) Administered   Influenza, Seasonal, Injecte, Preservative Fre 07/01/2012   PFIZER(Purple Top)SARS-COV-2 Vaccination 09/05/2019, 09/26/2019, 03/27/2020   Pneumococcal Conjugate-13 07/15/2015   Pneumococcal Polysaccharide-23 06/25/2011   Td 11/20/1997, 06/11/2008   Tdap 04/29/2015     Review of Systems as above: See pertinent positives and pertinent negatives per HPI No acute distress verbally   Observations/Objective/Exam:  An attempt was made to discern vital signs over the phone and per patient if applicable and possible.   General:     Alert, Oriented, appears well and in no acute distress  Pulmonary:     On inspection no signs of respiratory distress.  Psych / Neurological:     Pleasant and cooperative.  Assessment and Plan:    ICD-10-CM   1. Suspected COVID-19 virus infection  Z20.822     2. Cough  R05.9      Persistent cough x7 days with other symptoms resolving. Concern for COVID-19 given high levels right now in our community.  Check COVID as well as influenza.  She will do this at our office this afternoon.  I discussed the assessment and treatment plan with the patient. The patient was provided an opportunity to ask questions and all were answered. The patient agreed with the plan and demonstrated an understanding of the instructions.   The patient was advised to call back or seek an in-person evaluation if the symptoms worsen or if the condition fails to improve as anticipated.  Follow-up: prn unless noted otherwise below No follow-ups on file.  No orders of the defined types were placed in this encounter.  No orders of the defined types were placed in this encounter.   Signed,  Maud Deed. Brenya Taulbee, MD

## 2020-12-09 NOTE — Progress Notes (Signed)
Check Covid status.

## 2020-12-10 LAB — NOVEL CORONAVIRUS, NAA: SARS-CoV-2, NAA: NOT DETECTED

## 2020-12-10 LAB — SARS-COV-2, NAA 2 DAY TAT

## 2020-12-11 ENCOUNTER — Telehealth: Payer: Self-pay | Admitting: Family Medicine

## 2020-12-11 MED ORDER — BENZONATATE 200 MG PO CAPS: 200.0000 mg | ORAL_CAPSULE | Freq: Three times a day (TID) | ORAL | 1 refills | Status: DC | PRN

## 2020-12-11 NOTE — Telephone Encounter (Signed)
Given a documented intolerance to codeine, I will send in only tessalon perles  Pleast let know  Meds ordered this encounter  Medications   benzonatate (TESSALON) 200 MG capsule    Sig: Take 1 capsule (200 mg total) by mouth 3 (three) times daily as needed for cough.    Dispense:  50 capsule    Refill:  1

## 2020-12-11 NOTE — Telephone Encounter (Signed)
Patient called in asking for some medication to help with her cough. Patient did video visit last week with Copland. Patient had neg covid test. EM

## 2020-12-11 NOTE — Telephone Encounter (Signed)
I will forward this message to Dr Lorelei Pont since he is in the office today and he is the provider who saw her.

## 2020-12-11 NOTE — Telephone Encounter (Signed)
Ms. Fishbaugh notified as instructed by telephone.

## 2020-12-11 NOTE — Addendum Note (Signed)
Addended by: Owens Loffler on: 12/11/2020 04:49 PM   Modules accepted: Orders

## 2020-12-28 ENCOUNTER — Other Ambulatory Visit: Payer: Self-pay | Admitting: Family Medicine

## 2020-12-28 DIAGNOSIS — R339 Retention of urine, unspecified: Secondary | ICD-10-CM

## 2021-07-12 DIAGNOSIS — M1990 Unspecified osteoarthritis, unspecified site: Secondary | ICD-10-CM | POA: Diagnosis not present

## 2021-07-12 DIAGNOSIS — E785 Hyperlipidemia, unspecified: Secondary | ICD-10-CM | POA: Diagnosis not present

## 2021-07-12 DIAGNOSIS — M8580 Other specified disorders of bone density and structure, unspecified site: Secondary | ICD-10-CM | POA: Diagnosis not present

## 2021-07-12 DIAGNOSIS — G8929 Other chronic pain: Secondary | ICD-10-CM | POA: Diagnosis not present

## 2021-07-12 DIAGNOSIS — R32 Unspecified urinary incontinence: Secondary | ICD-10-CM | POA: Diagnosis not present

## 2021-07-12 DIAGNOSIS — Z791 Long term (current) use of non-steroidal anti-inflammatories (NSAID): Secondary | ICD-10-CM | POA: Diagnosis not present

## 2021-07-12 DIAGNOSIS — Z823 Family history of stroke: Secondary | ICD-10-CM | POA: Diagnosis not present

## 2021-07-12 DIAGNOSIS — Z008 Encounter for other general examination: Secondary | ICD-10-CM | POA: Diagnosis not present

## 2021-07-12 DIAGNOSIS — R69 Illness, unspecified: Secondary | ICD-10-CM | POA: Diagnosis not present

## 2021-07-12 DIAGNOSIS — Z8249 Family history of ischemic heart disease and other diseases of the circulatory system: Secondary | ICD-10-CM | POA: Diagnosis not present

## 2021-07-12 DIAGNOSIS — I10 Essential (primary) hypertension: Secondary | ICD-10-CM | POA: Diagnosis not present

## 2021-07-12 DIAGNOSIS — Z809 Family history of malignant neoplasm, unspecified: Secondary | ICD-10-CM | POA: Diagnosis not present

## 2021-07-12 DIAGNOSIS — J309 Allergic rhinitis, unspecified: Secondary | ICD-10-CM | POA: Diagnosis not present

## 2021-07-14 ENCOUNTER — Emergency Department (HOSPITAL_BASED_OUTPATIENT_CLINIC_OR_DEPARTMENT_OTHER): Payer: Medicare HMO

## 2021-07-14 ENCOUNTER — Emergency Department (HOSPITAL_BASED_OUTPATIENT_CLINIC_OR_DEPARTMENT_OTHER)
Admission: EM | Admit: 2021-07-14 | Discharge: 2021-07-14 | Disposition: A | Discharge status: Home / Self Care | Payer: Medicare HMO | Attending: Emergency Medicine | Admitting: Emergency Medicine

## 2021-07-14 ENCOUNTER — Encounter (HOSPITAL_BASED_OUTPATIENT_CLINIC_OR_DEPARTMENT_OTHER): Payer: Self-pay

## 2021-07-14 ENCOUNTER — Other Ambulatory Visit: Payer: Self-pay

## 2021-07-14 DIAGNOSIS — N390 Urinary tract infection, site not specified: Secondary | ICD-10-CM | POA: Insufficient documentation

## 2021-07-14 DIAGNOSIS — R9431 Abnormal electrocardiogram [ECG] [EKG]: Secondary | ICD-10-CM | POA: Diagnosis not present

## 2021-07-14 DIAGNOSIS — R109 Unspecified abdominal pain: Secondary | ICD-10-CM | POA: Diagnosis not present

## 2021-07-14 DIAGNOSIS — R1031 Right lower quadrant pain: Secondary | ICD-10-CM | POA: Diagnosis not present

## 2021-07-14 DIAGNOSIS — R1084 Generalized abdominal pain: Secondary | ICD-10-CM | POA: Diagnosis present

## 2021-07-14 DIAGNOSIS — I700 Atherosclerosis of aorta: Secondary | ICD-10-CM | POA: Diagnosis not present

## 2021-07-14 LAB — COMPREHENSIVE METABOLIC PANEL
ALT: 11 U/L (ref 0–44)
AST: 17 U/L (ref 15–41)
Albumin: 4.2 g/dL (ref 3.5–5.0)
Alkaline Phosphatase: 61 U/L (ref 38–126)
Anion gap: 11 (ref 5–15)
BUN: 16 mg/dL (ref 8–23)
CO2: 26 mmol/L (ref 22–32)
Calcium: 9.6 mg/dL (ref 8.9–10.3)
Chloride: 102 mmol/L (ref 98–111)
Creatinine, Ser: 0.62 mg/dL (ref 0.44–1.00)
GFR, Estimated: >60 mL/min (ref >60)
Glucose, Bld: 108 mg/dL — ABNORMAL HIGH (ref 70–99)
Potassium: 3.8 mmol/L (ref 3.5–5.1)
Sodium: 139 mmol/L (ref 135–145)
Total Bilirubin: 0.7 mg/dL (ref 0.3–1.2)
Total Protein: 6.8 g/dL (ref 6.5–8.1)

## 2021-07-14 LAB — CBC
HCT: 45.1 % (ref 36.0–46.0)
Hemoglobin: 15.1 g/dL — ABNORMAL HIGH (ref 12.0–15.0)
MCH: 29.3 pg (ref 26.0–34.0)
MCHC: 33.5 g/dL (ref 30.0–36.0)
MCV: 87.6 fL (ref 80.0–100.0)
Platelets: 230 10*3/uL (ref 150–400)
RBC: 5.15 MIL/uL — ABNORMAL HIGH (ref 3.87–5.11)
RDW: 12.3 % (ref 11.5–15.5)
WBC: 11.6 10*3/uL — ABNORMAL HIGH (ref 4.0–10.5)
nRBC: 0 % (ref 0.0–0.2)

## 2021-07-14 LAB — URINALYSIS, ROUTINE W REFLEX MICROSCOPIC
Bilirubin Urine: NEGATIVE
Glucose, UA: NEGATIVE mg/dL
Hgb urine dipstick: NEGATIVE
Nitrite: NEGATIVE
Protein, ur: NEGATIVE mg/dL
Specific Gravity, Urine: 1.014 (ref 1.005–1.030)
pH: 7 (ref 5.0–8.0)

## 2021-07-14 LAB — LIPASE, BLOOD: Lipase: 16 U/L (ref 11–51)

## 2021-07-14 IMAGING — CT CT ABD-PELV W/O CM
2 of 4 series · 16 of 46 positions shown, 18 images · non-contrast
Comparison: None.

CLINICAL DATA: Abdominal pain, nausea



[Series 2: abd pel wo · axial · 0.68mm/px · z∈[-395,-20]mm · 13 of 83 slices shown, 15 images]
[im 4/83  soft-tissue]
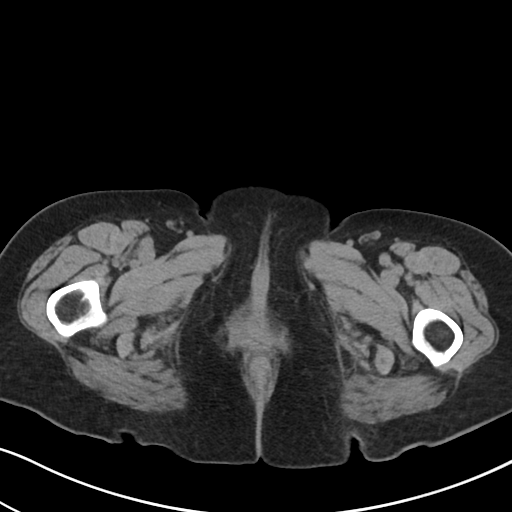
[im 4/83  bone]
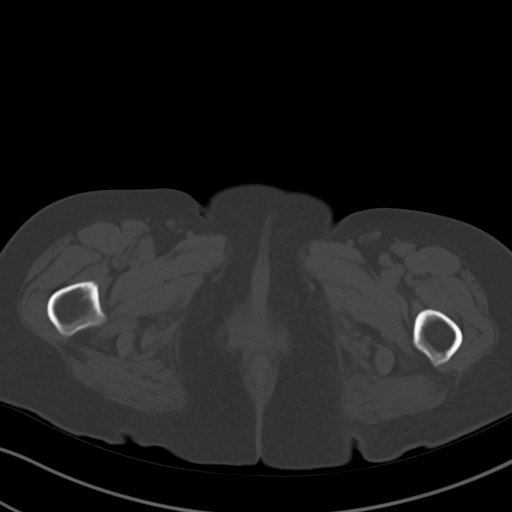
[im 11/83  soft-tissue]
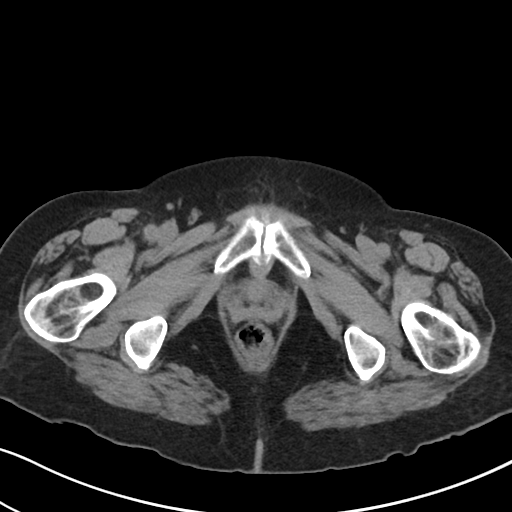
[im 18/83  soft-tissue]
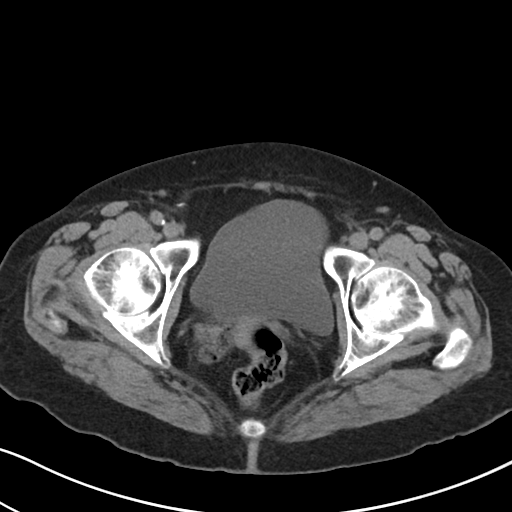
[im 24/83  soft-tissue]
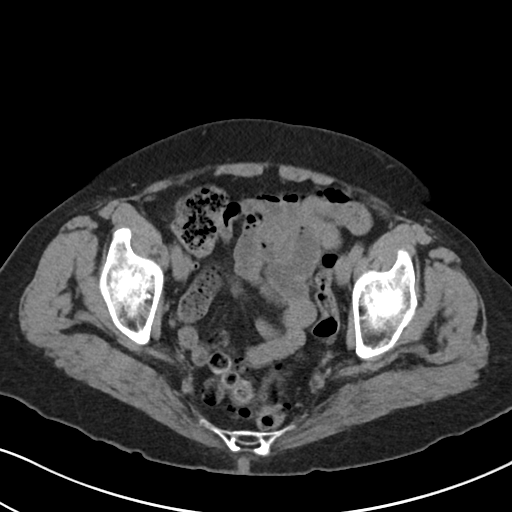
[im 28/83  soft-tissue]
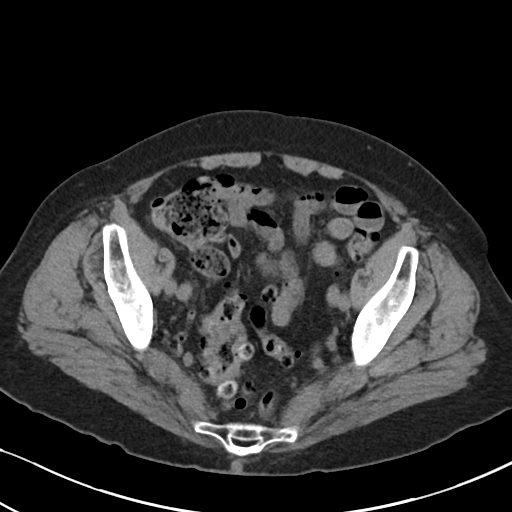
[im 35/83  soft-tissue]
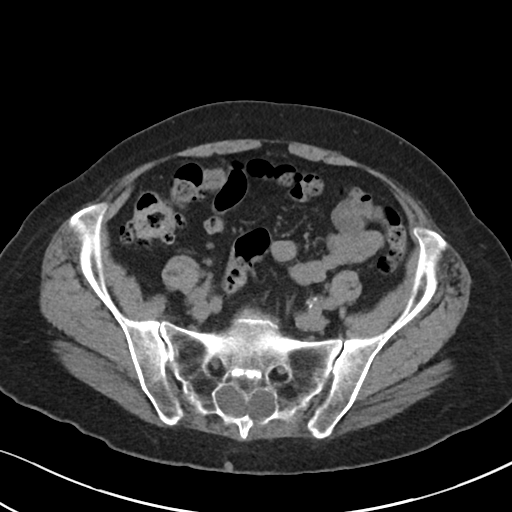
[im 42/83  soft-tissue]
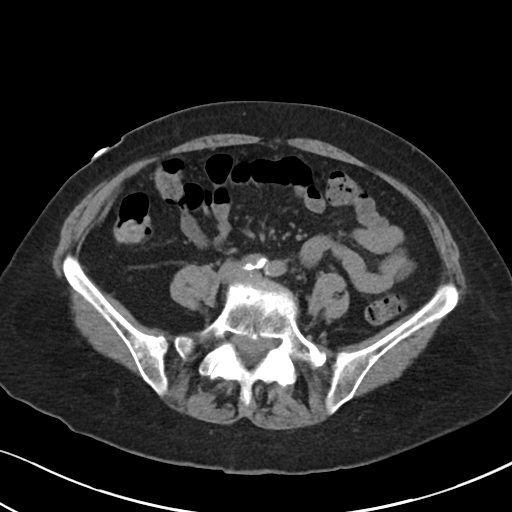
[im 48/83  soft-tissue]
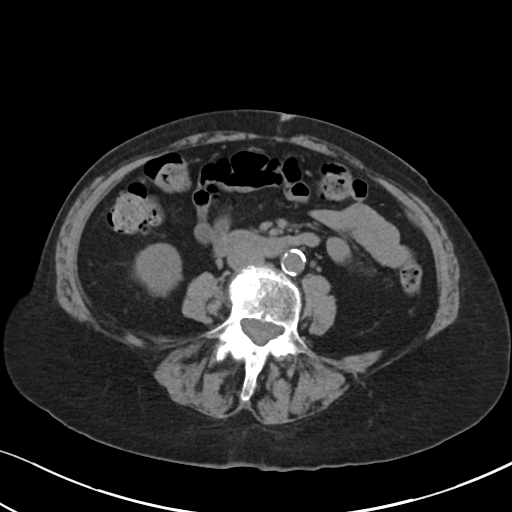
[im 55/83  soft-tissue]
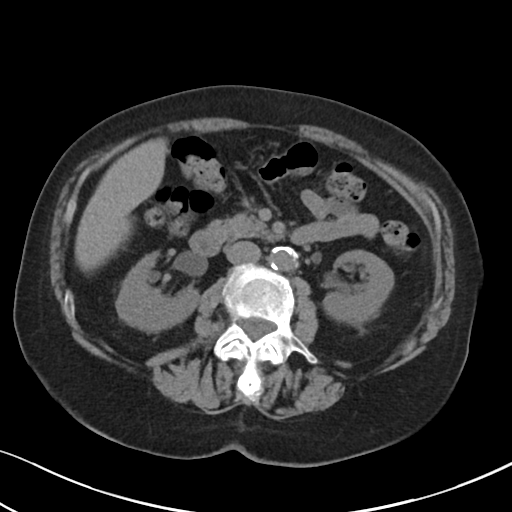
[im 55/83  bone]
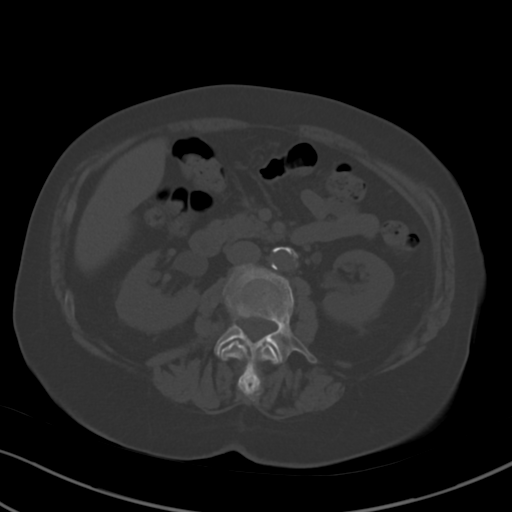
[im 59/83  soft-tissue]
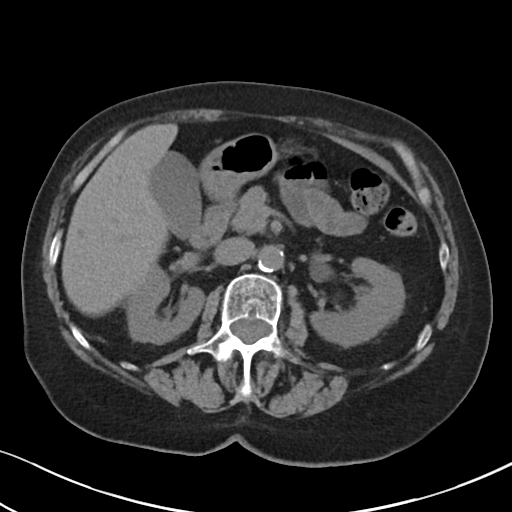
[im 65/83  soft-tissue]
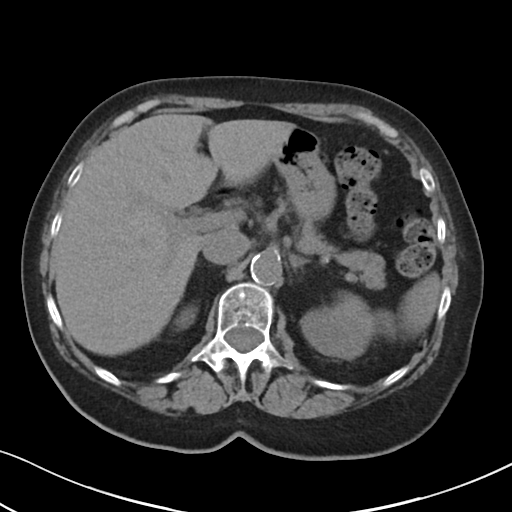
[im 72/83  soft-tissue]
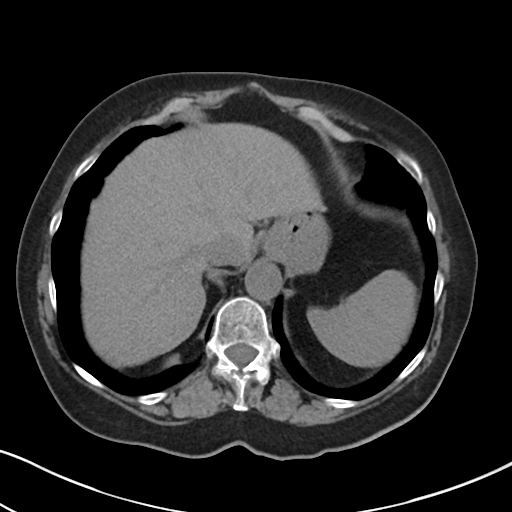
[im 79/83  soft-tissue]
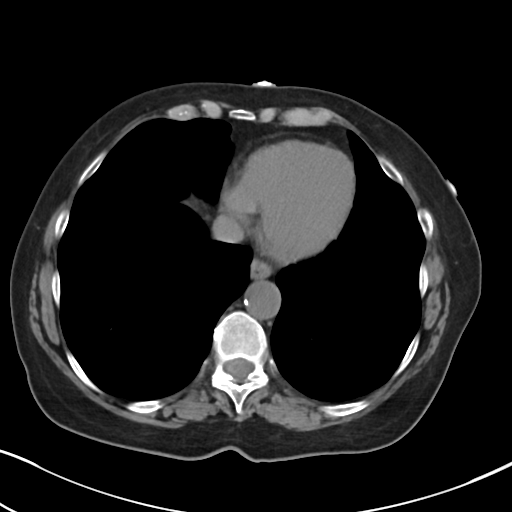

[Series 5: coronal · coronal · 0.67mm/px · 3 of 91 slices shown]
[im 31/91  soft-tissue]
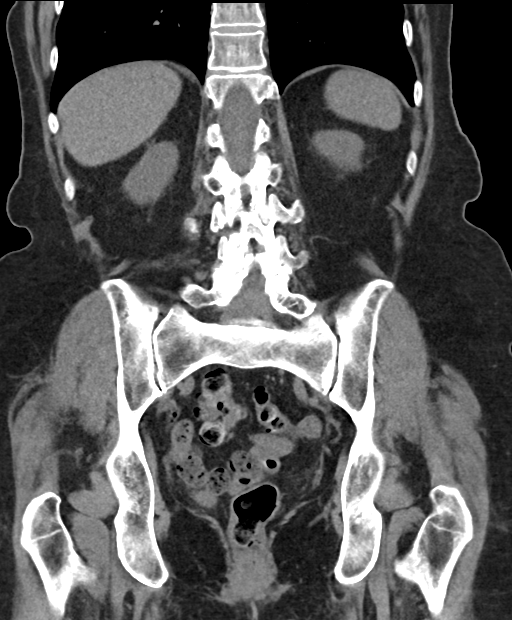
[im 41/91  soft-tissue]
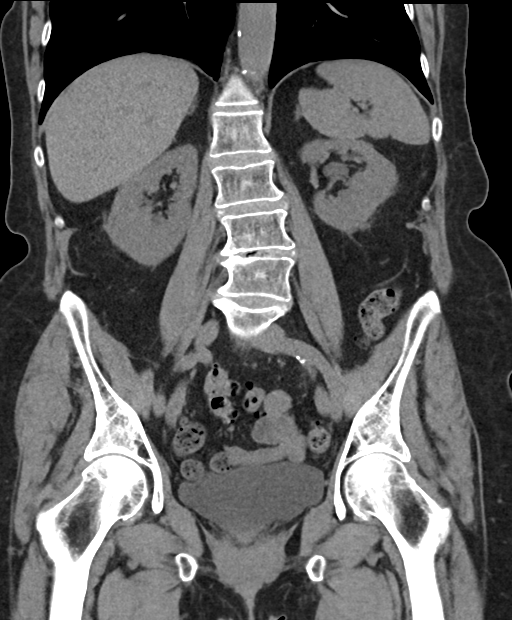
[im 51/91  soft-tissue]
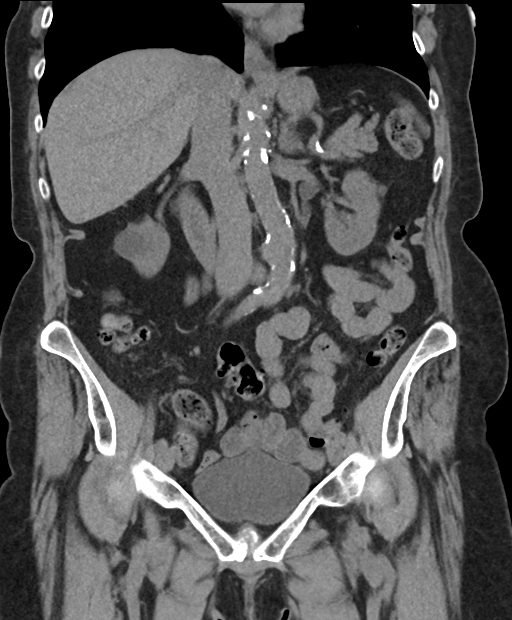

[16 of 46 positions shown; findings below may reference images not displayed]

FINDINGS: Lower chest: No acute abnormality.

Hepatobiliary: Small hypodensities scattered throughout the liver,
difficult to characterize due to their small size and lack of IV
contrast but most likely cysts. Gallbladder unremarkable.

Pancreas: No focal abnormality or ductal dilatation.

Spleen: No focal abnormality.  Normal size.

Adrenals/Urinary Tract: 2.7 cm cyst in the midpole of the right
kidney. No stones or hydronephrosis. Adrenal glands and urinary
bladder unremarkable.

Stomach/Bowel: Colonic diverticulosis. No active diverticulitis.
Normal appendix. Stomach and small bowel grossly unremarkable.

Vascular/Lymphatic: Aortic atherosclerosis. No evidence of aneurysm
or adenopathy.

Reproductive: Prior hysterectomy.  No adnexal masses.

Other: No free fluid or free air.

Musculoskeletal: No acute bony abnormality.
IMPRESSION: Diffuse colonic diverticulosis.  No active diverticulitis.

Aortic atherosclerosis.

No acute findings.

## 2021-07-14 MED ORDER — HYDROCODONE-ACETAMINOPHEN 5-325 MG PO TABS
2.0000 | ORAL_TABLET | Freq: Once | ORAL | Status: AC
Start: 2021-07-14 — End: 2021-07-14
  Administered 2021-07-14: 2 via ORAL
  Filled 2021-07-14: qty 2

## 2021-07-14 MED ORDER — HYDROCODONE-ACETAMINOPHEN 5-325 MG PO TABS: 1.0000 | ORAL_TABLET | ORAL | 0 refills | Status: DC | PRN

## 2021-07-14 MED ORDER — CEPHALEXIN 250 MG PO CAPS
500.0000 mg | ORAL_CAPSULE | Freq: Once | ORAL | Status: AC
Start: 2021-07-14 — End: 2021-07-14
  Administered 2021-07-14: 500 mg via ORAL
  Filled 2021-07-14: qty 2

## 2021-07-14 MED ORDER — CEPHALEXIN 500 MG PO CAPS: 500.0000 mg | ORAL_CAPSULE | Freq: Four times a day (QID) | ORAL | 0 refills | Status: DC

## 2021-07-14 NOTE — ED Provider Notes (Addendum)
[facility] EMERGENCY DEPT Provider Note   CSN: [id number] Arrival date & time: 07/14/21  1727     History  Chief Complaint  Patient presents with   Abdominal Pain   Generalized Body Aches    Desiree Ortega is a 76 y.o. female.  76 year old female presents with abdominal pain that began approximately 10 hours ago.  States the pain started in her left flank and then went to her right lower quadrant.  She denies any nausea, vomiting, diarrhea.  No urinary symptoms.  No treatment use prior to arrival      Home Medications Prior to Admission medications   Medication Sig Start Date End Date Taking? Authorizing Provider  acetaminophen (TYLENOL) 500 MG tablet Take 500 mg by mouth 2 (two) times daily as needed (for pain.).    [provider]  Ascorbic Acid (VITAMIN C) 1000 MG tablet Take 1 tablet (1,000 mg total) by mouth daily. 08/21/18   Tonia Ghent, MD  b complex vitamins tablet Take 1 tablet by mouth daily.    [provider]  benzonatate (TESSALON) 200 MG capsule Take 1 capsule (200 mg total) by mouth 3 (three) times daily as needed for cough. 12/11/20   Copland, Frederico Hamman, MD  Cholecalciferol (VITAMIN D PO) Take by mouth.    [provider]  CINNAMON PO Take 1,000 mg by mouth daily.    [provider]  Cyanocobalamin (B-12 PO) Take by mouth.    [provider]  fexofenadine (ALLEGRA) 180 MG tablet Take 1 tablet (180 mg total) by mouth as needed. Seasonal allergies from Spring to Fall 08/21/18   Tonia Ghent, MD  Flaxseed, Linseed, (FLAX SEEDS PO) Take by mouth.    [provider]  Magnesium 250 MG TABS Take 250 mg by mouth daily.    [provider]  Melatonin 3 MG TABS Take 1 tablet (3 mg total) by mouth at bedtime as needed. 08/21/18   Tonia Ghent, MD  meloxicam (MOBIC) 15 MG tablet TAKE 1 TABLET BY MOUTH ONCE DAILY WITH FOOD (DO  NOT  TAKE  WITH  ALEVE  OR  IBUPROFEN) 01/01/21   Tonia Ghent,  MD  Multiple Vitamin (MULTIVITAMIN) capsule Take 1 capsule by mouth daily. 08/21/18   Tonia Ghent, MD  Omega-3 Fatty Acids (FISH OIL PO) Take by mouth.    [provider]  Red Yeast Rice Extract 600 MG CAPS Take 1 capsule (600 mg total) by mouth daily. 08/23/20   Tonia Ghent, MD  Turmeric 500 MG CAPS Take by mouth.    [provider]  Ubiquinol 100 MG CAPS Take 100 mg by mouth daily. 08/21/18   Tonia Ghent, MD  Zinc 50 MG CAPS Take by mouth.    [provider]      Allergies    Alendronate sodium, Codeine, and Influenza vaccines    Review of Systems   Review of Systems  All other systems reviewed and are negative.  Physical Exam Updated Vital Signs BP (!) 203/86 (BP Location: Right Arm)    Pulse 77    Temp 97.8 F (36.6 C)    Resp 18    Ht 1.689 m (5' 6.5")    Wt 67.6 kg    SpO2 100%    BMI 23.69 kg/m  Physical Exam Vitals and nursing note reviewed.  Constitutional:      General: She is not in acute distress.    Appearance: Normal appearance. She is  well-developed. She is not toxic-appearing.  HENT:     Head: Normocephalic and atraumatic.  Eyes:     General: Lids are normal.     Conjunctiva/sclera: Conjunctivae normal.     Pupils: Pupils are equal, round, and reactive to light.  Neck:     Thyroid: No thyroid mass.     Trachea: No tracheal deviation.  Cardiovascular:     Rate and Rhythm: Normal rate and regular rhythm.     Heart sounds: Normal heart sounds. No murmur heard.   No gallop.  Pulmonary:     Effort: Pulmonary effort is normal. No respiratory distress.     Breath sounds: Normal breath sounds. No stridor. No decreased breath sounds, wheezing, rhonchi or rales.  Abdominal:     General: There is no distension.     Palpations: Abdomen is soft.     Tenderness: There is no abdominal tenderness. There is no rebound.    Musculoskeletal:        General: No tenderness. Normal range of motion.     Cervical back: Normal range of  motion and neck supple.  Skin:    General: Skin is warm and dry.     Findings: No abrasion or rash.  Neurological:     Mental Status: She is alert and oriented to person, place, and time. Mental status is at baseline.     GCS: GCS eye subscore is 4. GCS verbal subscore is 5. GCS motor subscore is 6.     Cranial Nerves: No cranial nerve deficit.     Sensory: No sensory deficit.     Motor: Motor function is intact.  Psychiatric:        Attention and Perception: Attention normal.        Speech: Speech normal.        Behavior: Behavior normal.    ED Results / Procedures / Treatments   Labs (all labs ordered are listed, but only abnormal results are displayed) Labs Reviewed  COMPREHENSIVE METABOLIC PANEL - Abnormal; Notable for the following components:      Result Value   Glucose, Bld 108 (*)    All other components within normal limits  CBC - Abnormal; Notable for the following components:   WBC 11.6 (*)    RBC 5.15 (*)    Hemoglobin 15.1 (*)    All other components within normal limits  LIPASE, BLOOD  URINALYSIS, ROUTINE W REFLEX MICROSCOPIC    EKG None  Radiology No results found.  Procedures Procedures    Medications Ordered in ED Medications - No data to display  ED Course/ Medical Decision Making/ A&P                           Medical Decision Making Amount and/or Complexity of Data Reviewed Labs: ordered. Radiology: ordered.  Risk Prescription drug management.   Patient is pain treated here with oral medications.  Patient had recent complaint back and flank pain.  Possible UTI will place on antibiotics.  Abdominal CT without acute findings.  Doubt that patient has pyelonephritis.  Blood work shows mild leukocytosis 11.6 thousand.  Will discharge home return precautions given        Final Clinical Impression(s) / ED Diagnoses Final diagnoses:  None    Rx / DC Orders ED Discharge Orders     None         Lacretia Leigh, MD 07/14/21  2202    Lacretia Leigh, MD 07/14/21  2205 ° °

## 2021-07-14 NOTE — ED Triage Notes (Signed)
Pt c/o lower abdominal pain and nausea that started this morning. Pt states the pain has now radiated all over.

## 2021-07-14 NOTE — ED Notes (Signed)
Patient transported to CT 

## 2021-07-15 ENCOUNTER — Other Ambulatory Visit: Payer: Self-pay

## 2021-07-15 ENCOUNTER — Ambulatory Visit: Payer: Self-pay

## 2021-07-15 ENCOUNTER — Telehealth: Payer: Self-pay

## 2021-07-15 ENCOUNTER — Emergency Department (HOSPITAL_COMMUNITY)
Admission: EM | Admit: 2021-07-15 | Discharge: 2021-07-16 | Disposition: A | Discharge status: Home / Self Care | Payer: Medicare HMO | Attending: Emergency Medicine | Admitting: Emergency Medicine

## 2021-07-15 ENCOUNTER — Encounter (HOSPITAL_COMMUNITY): Payer: Self-pay | Admitting: Emergency Medicine

## 2021-07-15 DIAGNOSIS — R1011 Right upper quadrant pain: Secondary | ICD-10-CM | POA: Diagnosis not present

## 2021-07-15 DIAGNOSIS — M79601 Pain in right arm: Secondary | ICD-10-CM | POA: Diagnosis not present

## 2021-07-15 DIAGNOSIS — M25551 Pain in right hip: Secondary | ICD-10-CM | POA: Diagnosis not present

## 2021-07-15 DIAGNOSIS — Z85828 Personal history of other malignant neoplasm of skin: Secondary | ICD-10-CM | POA: Diagnosis not present

## 2021-07-15 DIAGNOSIS — Z20822 Contact with and (suspected) exposure to covid-19: Secondary | ICD-10-CM | POA: Insufficient documentation

## 2021-07-15 DIAGNOSIS — M79602 Pain in left arm: Secondary | ICD-10-CM | POA: Insufficient documentation

## 2021-07-15 DIAGNOSIS — R000 Tachycardia, unspecified: Secondary | ICD-10-CM | POA: Diagnosis not present

## 2021-07-15 DIAGNOSIS — Z87442 Personal history of urinary calculi: Secondary | ICD-10-CM | POA: Insufficient documentation

## 2021-07-15 DIAGNOSIS — Z743 Need for continuous supervision: Secondary | ICD-10-CM | POA: Diagnosis not present

## 2021-07-15 DIAGNOSIS — R110 Nausea: Secondary | ICD-10-CM | POA: Diagnosis not present

## 2021-07-15 DIAGNOSIS — M25552 Pain in left hip: Secondary | ICD-10-CM | POA: Diagnosis not present

## 2021-07-15 DIAGNOSIS — R509 Fever, unspecified: Secondary | ICD-10-CM | POA: Insufficient documentation

## 2021-07-15 DIAGNOSIS — I10 Essential (primary) hypertension: Secondary | ICD-10-CM | POA: Diagnosis not present

## 2021-07-15 DIAGNOSIS — R1084 Generalized abdominal pain: Secondary | ICD-10-CM | POA: Diagnosis not present

## 2021-07-15 DIAGNOSIS — I700 Atherosclerosis of aorta: Secondary | ICD-10-CM | POA: Diagnosis not present

## 2021-07-15 DIAGNOSIS — R200 Anesthesia of skin: Secondary | ICD-10-CM | POA: Diagnosis not present

## 2021-07-15 DIAGNOSIS — R109 Unspecified abdominal pain: Secondary | ICD-10-CM | POA: Diagnosis not present

## 2021-07-15 MED ORDER — OXYCODONE-ACETAMINOPHEN 5-325 MG PO TABS
1.0000 | ORAL_TABLET | Freq: Once | ORAL | Status: AC
  Administered 2021-07-15: 1 via ORAL
  Filled 2021-07-15: qty 1

## 2021-07-15 NOTE — Telephone Encounter (Signed)
[place] [facility] - Client TELEPHONE ADVICE RECORD [facility] Patient Name: Desiree Ortega [facility] Gender: Female DOB: 1945/06/25 Age: 76 Y 90 M 14 D Return Phone Number: [phone] (Primary) Address: City/ State/ Zip: [place] [address] Client [place] [facility] - Client Client Site [place] - Day Provider Renford Dills - MD Contact Type Call Who Is Calling Patient / Member / Family / Caregiver Call Type Triage / Clinical Relationship To Patient Self Return Phone Number [phone] (Primary) Chief Complaint SEVERE ABDOMINAL PAIN - Severe pain in abdomen Reason for Call Symptomatic / Request for Health Information Initial Comment veronica, [name] abdomnal stomcah pain , moving down left side moving to back and hip. rib is sore. move to arm pit down to arm Translation No Nurse Assessment Nurse: Glean Salvo, RN, Hayden Rasmussen Date/Time (Eastern Time): 07/14/2021 4:16:42 PM Confirm and document reason for call. If symptomatic, describe symptoms. ---Caller states she is having severe abdominal pain . Started this morning. It is now radiating across the hip on the left side up the back up to the rib cage and up to the arm pit. Arm felt like it was falling asleep. Denies chest pain. Most of the radiating pain has subsided but the knot is still in the stomach. A couple of years ago there was a bladder repair with a full hysterectomy. Urinating normally, no leakage of urine or incontinent episode. denies fever, denies diarrhea, denies vomiting, Nausea present. eating normally. Does the patient have any new or worsening symptoms? ---Yes Will a triage be completed? ---Yes Related visit to physician within the last 2 weeks? ---No Does the PT have any chronic conditions? (i.e. diabetes, asthma, this includes High risk factors for pregnancy, etc.) ---Yes List chronic conditions. ---Bladder Repair, Hysterectomy Is  this a behavioral health or substance abuse call? ---No Guidelines Guideline Title Affirmed Question Affirmed Notes Nurse Date/Time Eilene Ghazi Time) Abdominal Pain - Female Age > 80 years Glean Salvo, Therapist, sports, Hayden Rasmussen 07/14/2021 4:22:37 PM PLEASE NOTE: All timestamps contained within this report are represented as [place] Standard Time. CONFIDENTIALTY NOTICE: This fax transmission is intended only for the addressee. It contains information that is legally privileged, confidential or otherwise protected from use or disclosure. If you are not the intended recipient, you are strictly prohibited from reviewing, disclosing, copying using or disseminating any of this information or taking any action in reliance on or regarding this information. If you have received this fax in error, please notify us immediately by telephone so that we can arrange for its return to [place]. Phone: [phone], Toll-Free: [phone], Fax: [phone] Page: 2 of 2 Call Id: [id number] [place]. Time Eilene Ghazi Time) Disposition Final User 07/14/2021 4:14:43 PM Send to Urgent Katha Cabal 07/14/2021 4:25:17 PM See PCP within 24 Hours Yes Walker-Foster, RN, Hayden Rasmussen Caller Disagree/Comply Comply Caller Understands Yes PreDisposition Did not know what to do Care Advice Given Per Guideline SEE PCP WITHIN 24 HOURS: * Drink clear fluids only (e.g., water, flat soft drinks or half-strength Gatorade). DRINK CLEAR FLUIDS: * Sip small amounts at a time, until you feel better and the pain is gone. CALL BACK IF: * You become worse * Severe pain lasts over 1 hour CARE ADVICE given per Abdominal Pain, Female (Adult) guideline. Referrals REFERRED TO PCP OFFIC

## 2021-07-15 NOTE — ED Triage Notes (Signed)
Patient reports pain across her abdomen and low back pain onset this week , diagnosed with UTI yesterday , low grade fever .

## 2021-07-15 NOTE — Telephone Encounter (Signed)
Per chart review tab pt was seen at [facility]. Sending note to Dr Damita Dunnings and Janett Billow CMA.

## 2021-07-15 NOTE — ED Provider Triage Note (Signed)
Emergency Medicine Provider Triage Evaluation Note  Desiree Ortega , a 76 y.o. female  was evaluated in triage.  Pt complains of continued pain. States she was seen yesterday and started on abx and given pain medicine and states the pain is the same as yesterday. She's concerned that the pain isn't improving.  Has had 3 doses of keflex.   No dysuria, frequency or urgency.   Review of Systems  Positive: Abd pain, nausea Negative: Vomiting   Physical Exam  BP (!) 189/83 (BP Location: Right Arm)    Pulse 67    Temp 98.9 F (37.2 C) (Oral)    Resp (!) 22    Ht 5\' 6"  (1.676 m)    Wt 74 kg    SpO2 94%    BMI 26.33 kg/m  Gen:   Awake, no distress   Resp:  Normal effort  MSK:   Moves extremities without difficulty  Other:  Abd soft NTTP.   Medical Decision Making  Medically screening exam initiated at 11:45 PM.  Appropriate orders placed.  Desiree Ortega was informed that the remainder of the evaluation will be completed by another provider, this initial triage assessment does not replace that evaluation, and the importance of remaining in the ED until their evaluation is complete.  Will hold off on repeating CT.    Pati Gallo [place], [place] 07/15/21 [id number]

## 2021-07-15 NOTE — Telephone Encounter (Signed)
Noted.  Please get update on patient later this week.  Thanks.

## 2021-07-16 ENCOUNTER — Emergency Department (HOSPITAL_COMMUNITY): Payer: Medicare HMO

## 2021-07-16 DIAGNOSIS — R109 Unspecified abdominal pain: Secondary | ICD-10-CM | POA: Diagnosis not present

## 2021-07-16 DIAGNOSIS — R1084 Generalized abdominal pain: Secondary | ICD-10-CM | POA: Diagnosis not present

## 2021-07-16 DIAGNOSIS — I700 Atherosclerosis of aorta: Secondary | ICD-10-CM | POA: Diagnosis not present

## 2021-07-16 DIAGNOSIS — R1011 Right upper quadrant pain: Secondary | ICD-10-CM | POA: Diagnosis not present

## 2021-07-16 LAB — COMPREHENSIVE METABOLIC PANEL
ALT: 13 U/L (ref 0–44)
AST: 20 U/L (ref 15–41)
Albumin: 3.9 g/dL (ref 3.5–5.0)
Alkaline Phosphatase: 69 U/L (ref 38–126)
Anion gap: 7 (ref 5–15)
BUN: 12 mg/dL (ref 8–23)
CO2: 24 mmol/L (ref 22–32)
Calcium: 9.2 mg/dL (ref 8.9–10.3)
Chloride: 104 mmol/L (ref 98–111)
Creatinine, Ser: 0.78 mg/dL (ref 0.44–1.00)
GFR, Estimated: >60 mL/min (ref >60)
Glucose, Bld: 172 mg/dL — ABNORMAL HIGH (ref 70–99)
Potassium: 3.8 mmol/L (ref 3.5–5.1)
Sodium: 135 mmol/L (ref 135–145)
Total Bilirubin: 0.8 mg/dL (ref 0.3–1.2)
Total Protein: 7.1 g/dL (ref 6.5–8.1)

## 2021-07-16 LAB — CBC
HCT: 49 % — ABNORMAL HIGH (ref 36.0–46.0)
Hemoglobin: 15.8 g/dL — ABNORMAL HIGH (ref 12.0–15.0)
MCH: 29 pg (ref 26.0–34.0)
MCHC: 32.2 g/dL (ref 30.0–36.0)
MCV: 90.1 fL (ref 80.0–100.0)
Platelets: 281 10*3/uL (ref 150–400)
RBC: 5.44 MIL/uL — ABNORMAL HIGH (ref 3.87–5.11)
RDW: 12.5 % (ref 11.5–15.5)
WBC: 12.2 10*3/uL — ABNORMAL HIGH (ref 4.0–10.5)
nRBC: 0 % (ref 0.0–0.2)

## 2021-07-16 LAB — LIPASE, BLOOD: Lipase: 28 U/L (ref 11–51)

## 2021-07-16 LAB — CBC WITH DIFFERENTIAL/PLATELET
Abs Immature Granulocytes: 0.04 10*3/uL (ref 0.00–0.07)
Basophils Absolute: 0.1 10*3/uL (ref 0.0–0.1)
Basophils Relative: 0 %
Eosinophils Absolute: 0 10*3/uL (ref 0.0–0.5)
Eosinophils Relative: 0 %
HCT: 45.7 % (ref 36.0–46.0)
Hemoglobin: 14.8 g/dL (ref 12.0–15.0)
Immature Granulocytes: 0 %
Lymphocytes Relative: 29 %
Lymphs Abs: 3.4 10*3/uL (ref 0.7–4.0)
MCH: 29.1 pg (ref 26.0–34.0)
MCHC: 32.4 g/dL (ref 30.0–36.0)
MCV: 90 fL (ref 80.0–100.0)
Monocytes Absolute: 0.9 10*3/uL (ref 0.1–1.0)
Monocytes Relative: 8 %
Neutro Abs: 7.3 10*3/uL (ref 1.7–7.7)
Neutrophils Relative %: 63 %
Platelets: 243 10*3/uL (ref 150–400)
RBC: 5.08 MIL/uL (ref 3.87–5.11)
RDW: 12.5 % (ref 11.5–15.5)
WBC: 11.7 10*3/uL — ABNORMAL HIGH (ref 4.0–10.5)
nRBC: 0 % (ref 0.0–0.2)

## 2021-07-16 LAB — URINALYSIS, ROUTINE W REFLEX MICROSCOPIC
Bacteria, UA: NONE SEEN
Bilirubin Urine: NEGATIVE
Glucose, UA: NEGATIVE mg/dL
Hgb urine dipstick: NEGATIVE
Ketones, ur: 5 mg/dL — AB
Nitrite: NEGATIVE
Protein, ur: NEGATIVE mg/dL
Specific Gravity, Urine: 1.016 (ref 1.005–1.030)
pH: 6 (ref 5.0–8.0)

## 2021-07-16 LAB — RESP PANEL BY RT-PCR (FLU A&B, COVID) ARPGX2
Influenza A by PCR: NEGATIVE
Influenza B by PCR: NEGATIVE
SARS Coronavirus 2 by RT PCR: NEGATIVE

## 2021-07-16 LAB — LACTIC ACID, PLASMA: Lactic Acid, Venous: 1.8 mmol/L (ref 0.5–1.9)

## 2021-07-16 IMAGING — CT CT ABD-PELV W/ CM
2 of 5 series · 16 of 46 positions shown, 18 images · IV contrast (APPLIED)
Comparison: CT abdomen pelvis without contrast [DATE]

CLINICAL DATA: Abdominal pain, acute, nonlocalized.

EXAM:
CT ABDOMEN AND PELVIS WITH CONTRAST
TECHNIQUE: Multidetector CT imaging of the abdomen and pelvis was performed
using the standard protocol following bolus administration of
intravenous contrast.

[Series 3: abd/ pelvis 5.0 i30f 2 · axial · 0.79mm/px · z∈[+697,+1082]mm · 13 of 87 slices shown, 15 images]
[im 5/87  soft-tissue]
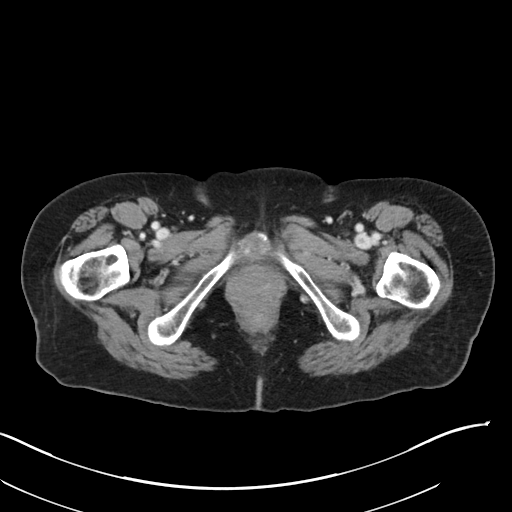
[im 5/87  bone]
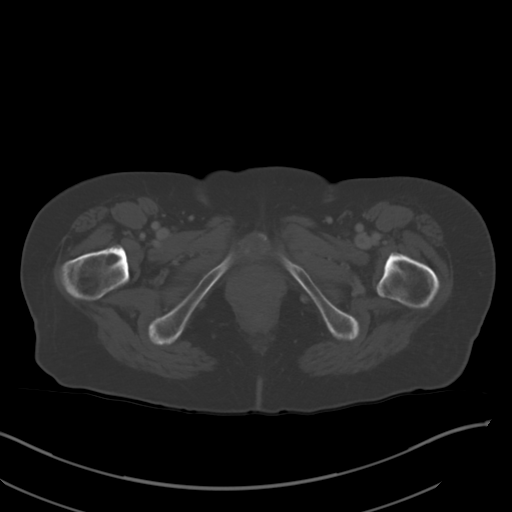
[im 13/87  soft-tissue]
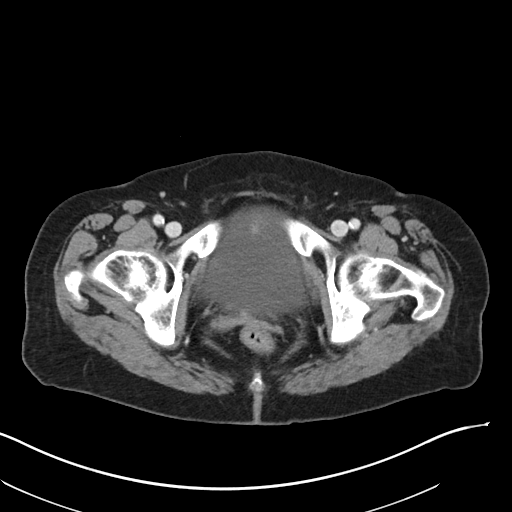
[im 18/87  soft-tissue]
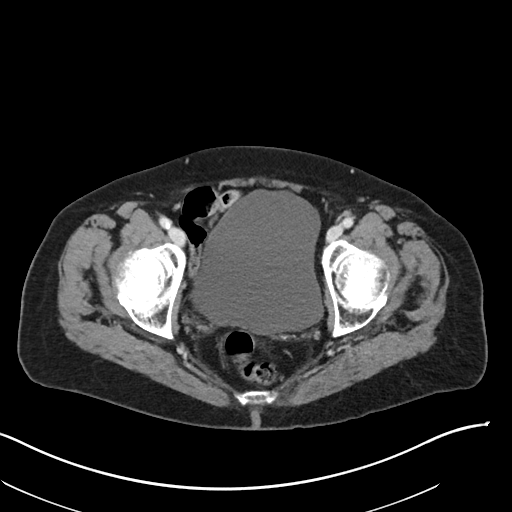
[im 26/87  soft-tissue]
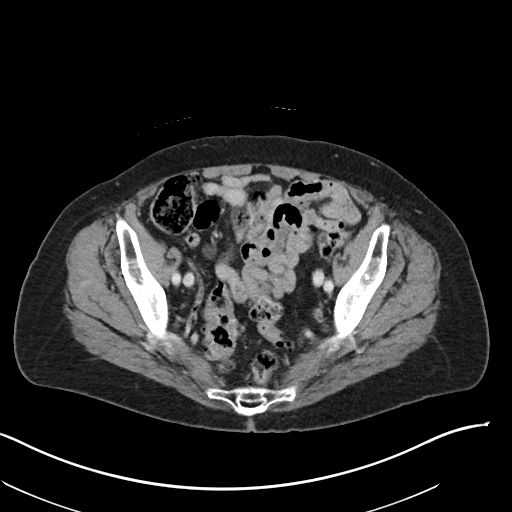
[im 31/87  soft-tissue]
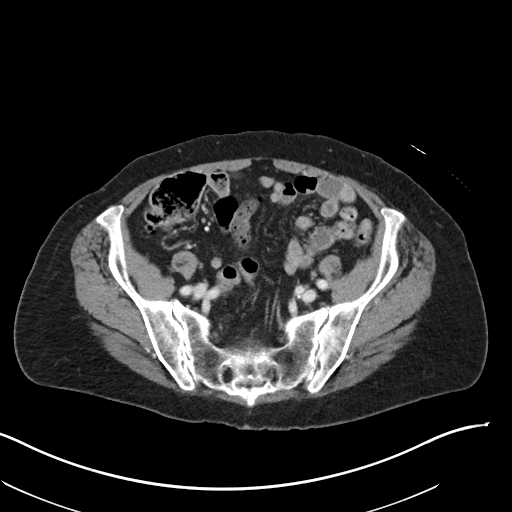
[im 39/87  soft-tissue]
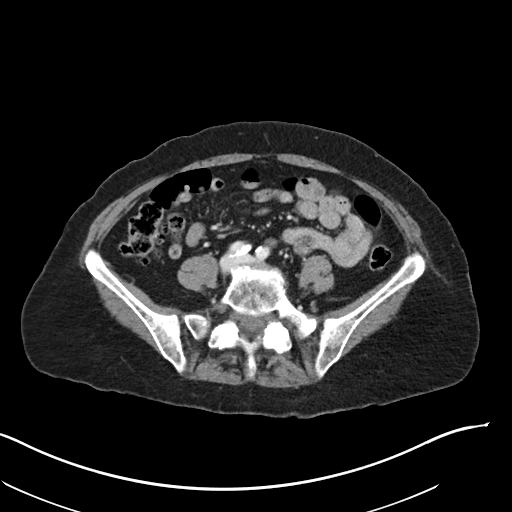
[im 44/87  soft-tissue]
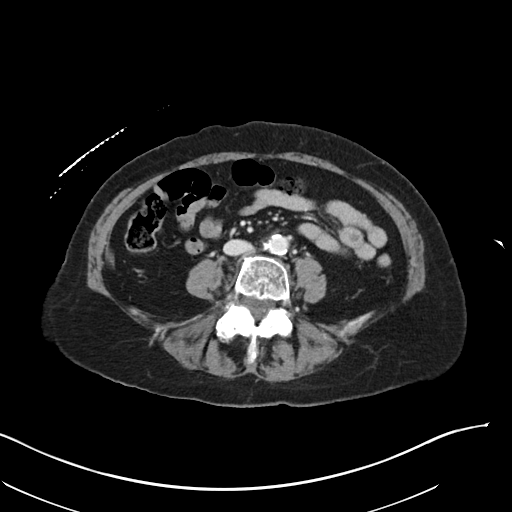
[im 48/87  soft-tissue]
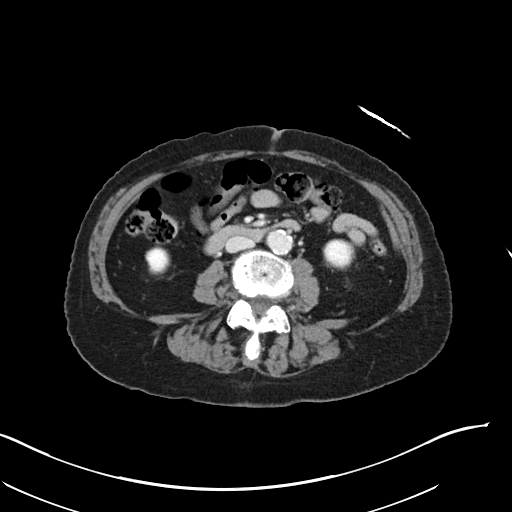
[im 56/87  soft-tissue]
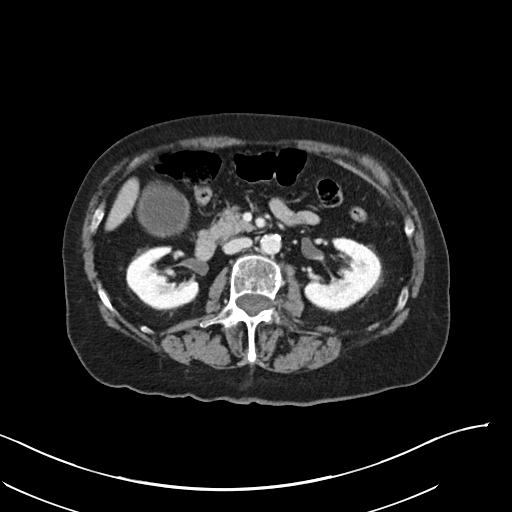
[im 56/87  bone]
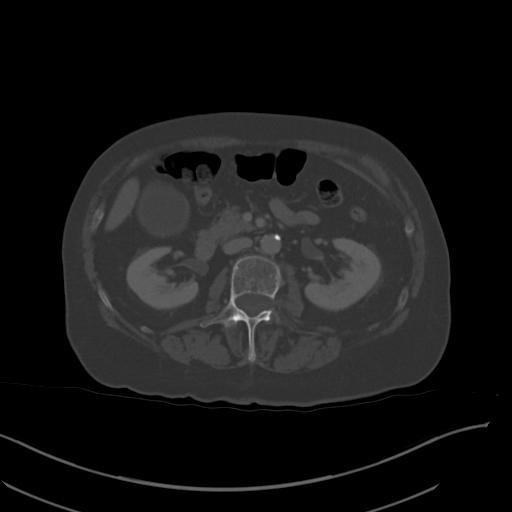
[im 61/87  soft-tissue]
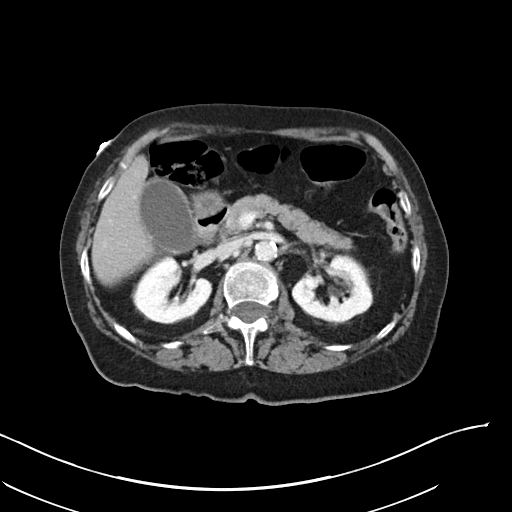
[im 69/87  soft-tissue]
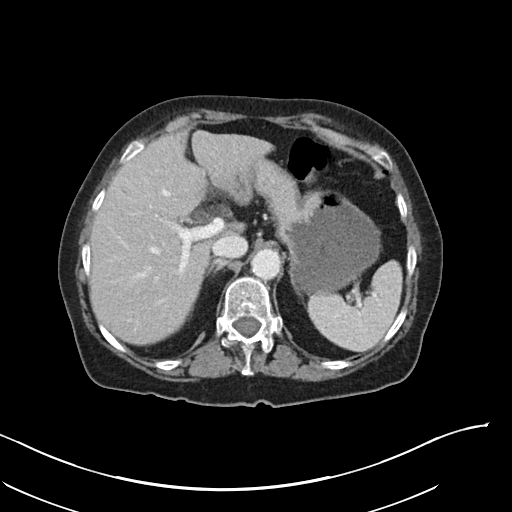
[im 74/87  soft-tissue]
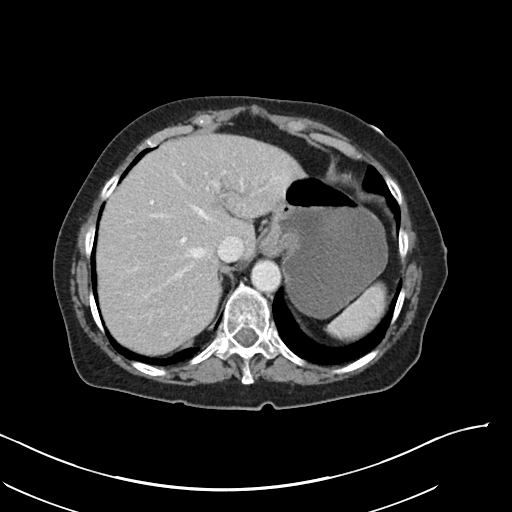
[im 82/87  soft-tissue]
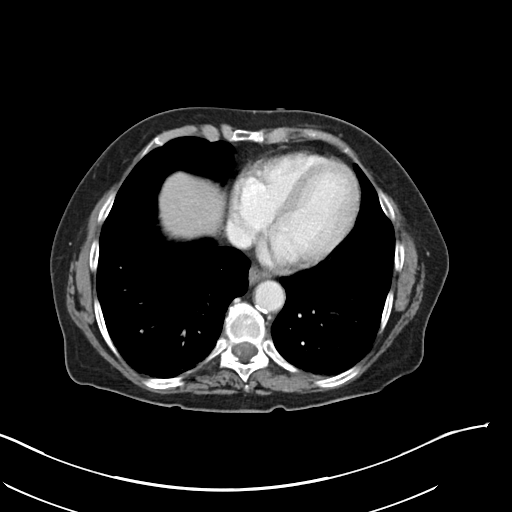

[Series 6: coronal soft tissue · coronal · 0.84mm/px · 3 of 101 slices shown]
[im 34/101  soft-tissue]
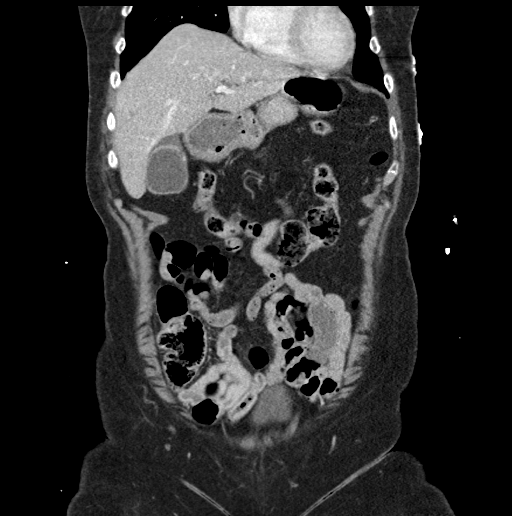
[im 45/101  soft-tissue]
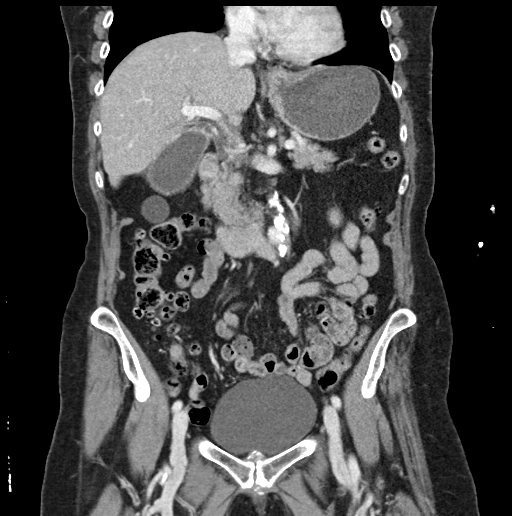
[im 56/101  soft-tissue]
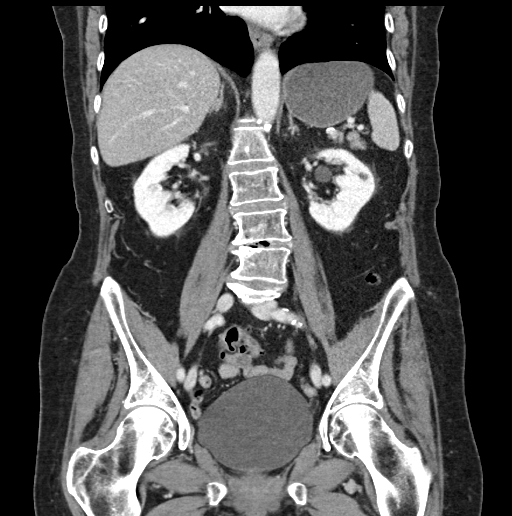

[16 of 46 positions shown; findings below may reference images not displayed]

RADIATION DOSE REDUCTION: This exam was performed according to the
departmental dose-optimization program which includes automated
exposure control, adjustment of the mA and/or kV according to
patient size and/or use of iterative reconstruction technique.

CONTRAST:  100mL OMNIPAQUE IOHEXOL 300 MG/ML  SOLN
FINDINGS: Lower chest: The lung bases are clear without focal nodule, mass, or
airspace disease. Heart size is normal. No significant pleural or
pericardial effusion is present.

Hepatobiliary: 7 mm simple cyst is present in the liver on image 12
of series 3 no other focal hepatic lesions are present. Stranding is
noted about the gallbladder with potential pericholecystic fluid,
not present 2 days ago. The common bile duct is within normal limits
for age.

Pancreas: Unremarkable. No pancreatic ductal dilatation or
surrounding inflammatory changes.

Spleen: Normal in size without focal abnormality.

Adrenals/Urinary Tract: An 8 mm simple cyst is present in the left
kidney on image 13 of series 8. Lower pole exophytic cyst is present
in the right kidney on image 18, measuring 29 mm. No other solid
lesions are present. No stone or obstruction is present. Ureters are
within normal limits bilaterally. The urinary bladder is
unremarkable.

Stomach/Bowel: Stomach and duodenum are within normal limits. Small
bowel is unremarkable. Terminal ileum is within normal limits. The
appendix is visualized and normal. Diverticular changes are present
in the ascending colon and distal descending and sigmoid colon
without inflammation to suggest diverticulitis. Colon is otherwise
unremarkable.

Vascular/Lymphatic: Atherosclerotic calcifications are present in
the aorta and branch vessels without aneurysm. No significant
adenopathy is present.

Reproductive: Status post hysterectomy. No adnexal masses.

Other: No abdominal wall hernia or abnormality. No abdominopelvic
ascites.

Musculoskeletal: Degenerative endplate changes are greatest on the
right at L3-4 secondary to leftward curvature. Vertebral body
heights otherwise within normal limits. Are left cyst noted at S2.
Facet degenerative changes are greatest at L5-S1. No focal lytic or
blastic lesions are present.
IMPRESSION: 1. Stranding about the gallbladder with potential pericholecystic
fluid, not present 2 days ago. This raises concern for acute
cholecystitis. Right upper quadrant ultrasound could be used for
further evaluation.
2. Colonic diverticulosis without diverticulitis.
3. Benign-appearing hepatic and renal cysts.
4. Aortic Atherosclerosis ([ZF]-[ZF]).

## 2021-07-16 IMAGING — US US ABDOMEN LIMITED
1 series · 14 of 25 positions shown · non-contrast
Comparison: Semi CT abdomen and pelvis dated [DATE]

CLINICAL DATA: Right upper quadrant pain

EXAM:
ULTRASOUND ABDOMEN LIMITED RIGHT UPPER QUADRANT

[Series 1: us abdomen limited ruq (liver/gb) · 14 of 66 slices shown]
[im 1/66]
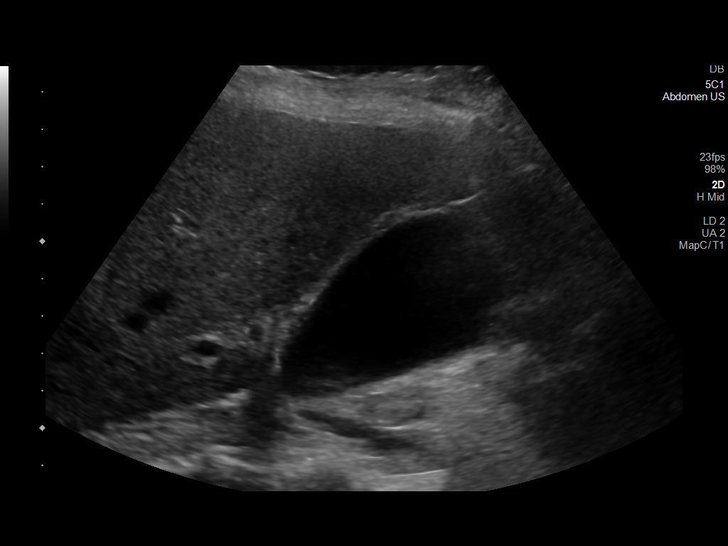
[im 6/66]
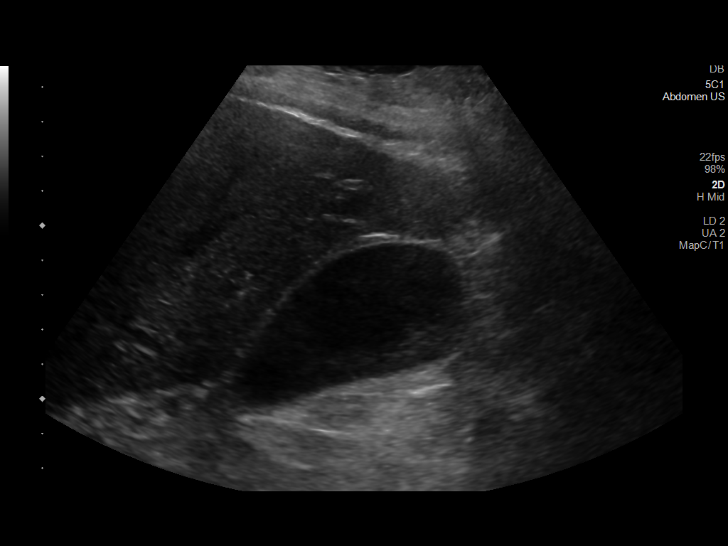
[im 11/66]
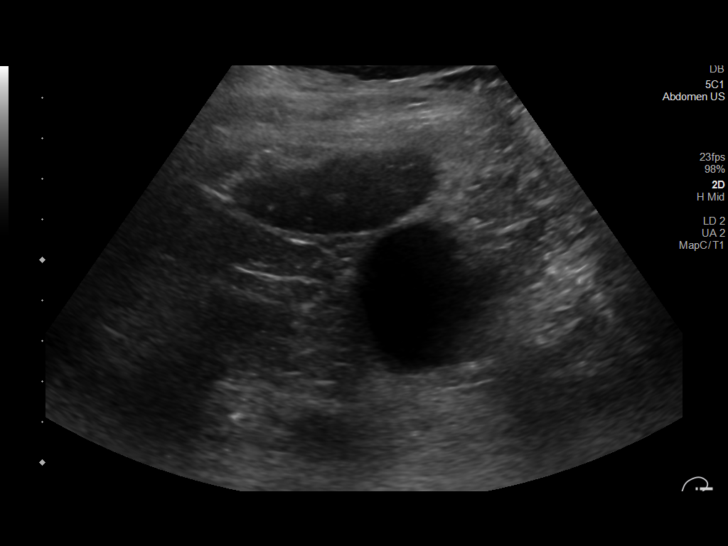
[im 17/66]
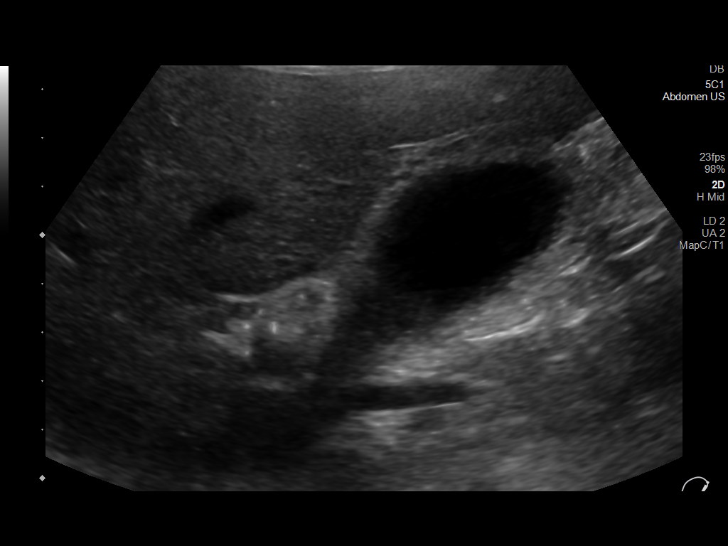
[im 22/66]
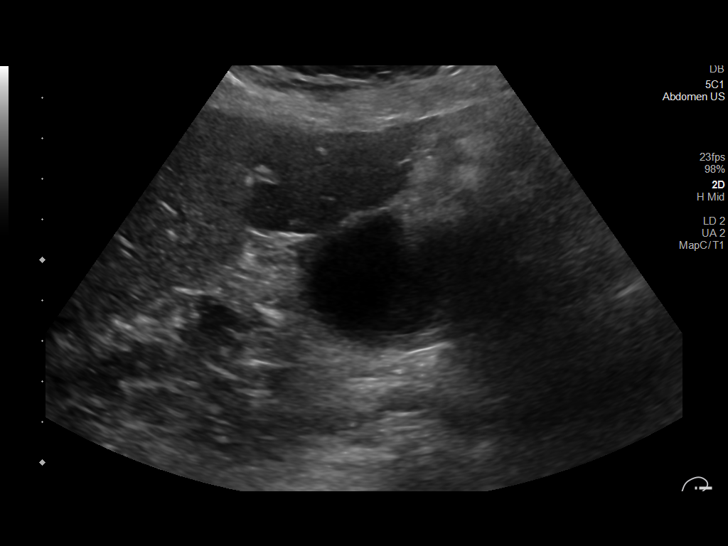
[im 25/66]
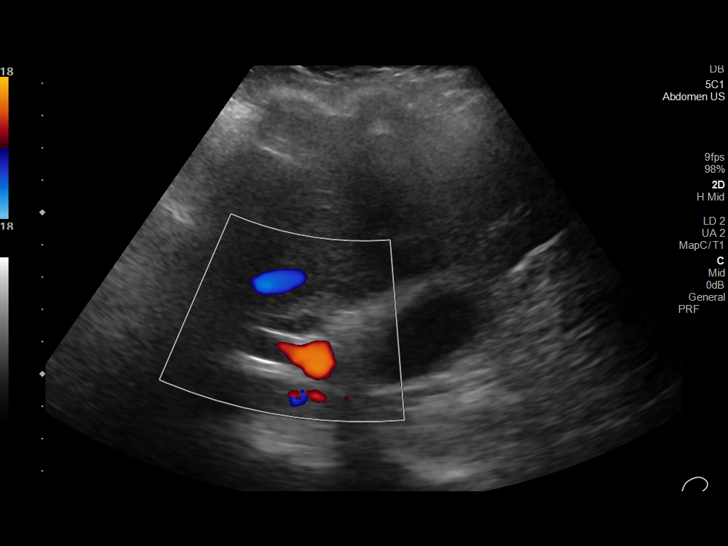
[im 30/66]
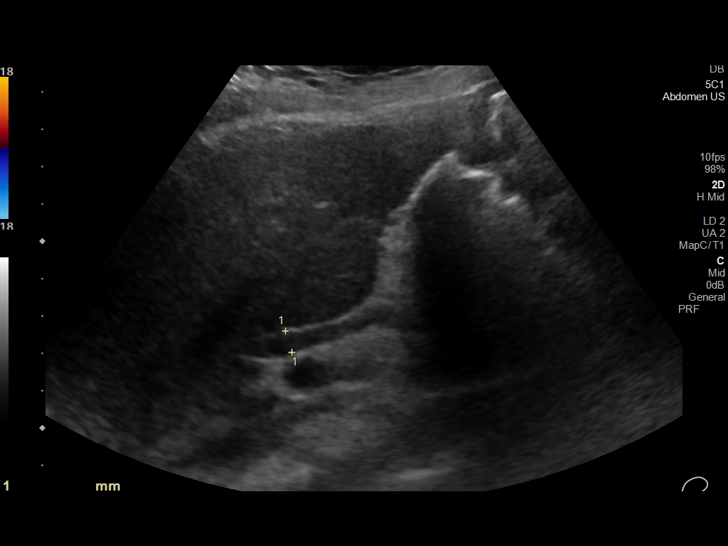
[im 36/66]
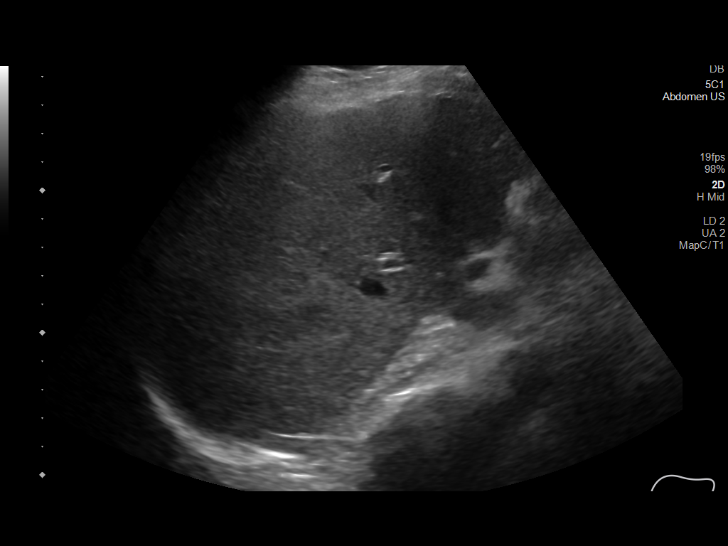
[im 41/66]
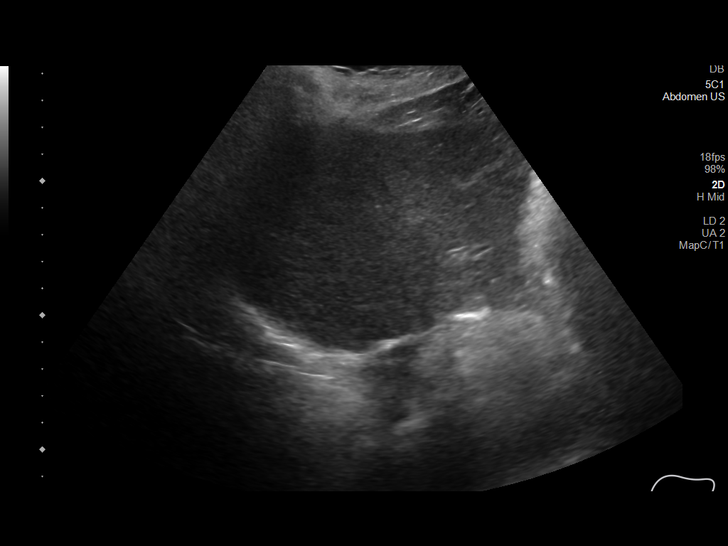
[im 44/66]
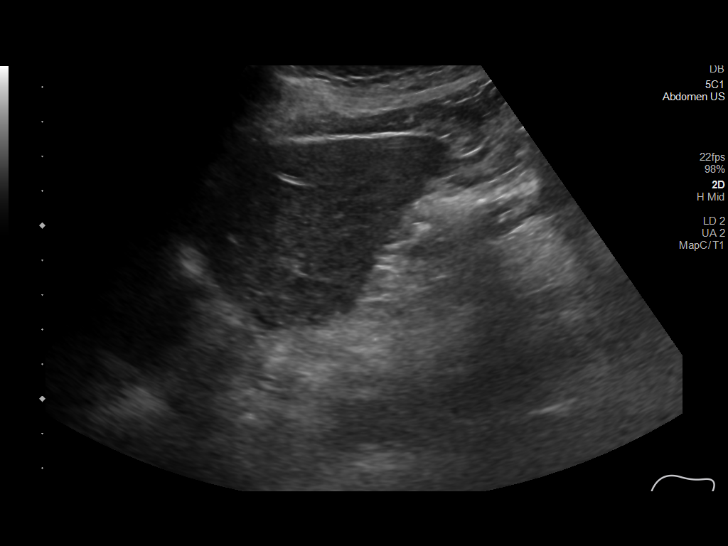
[im 49/66]
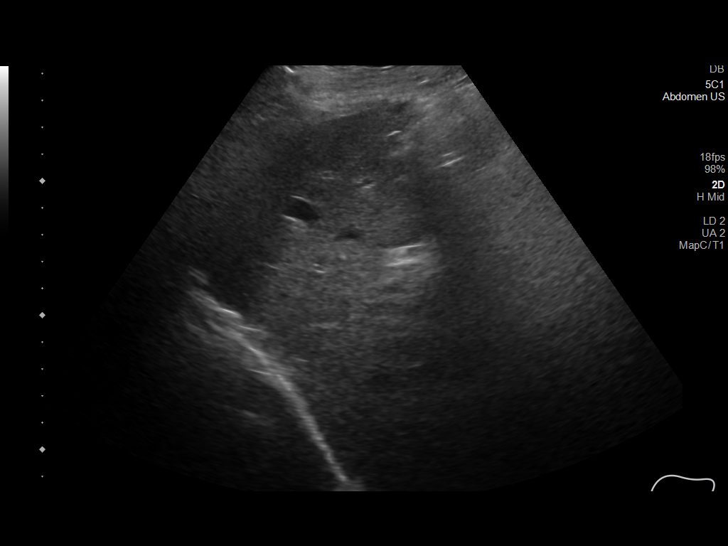
[im 55/66]
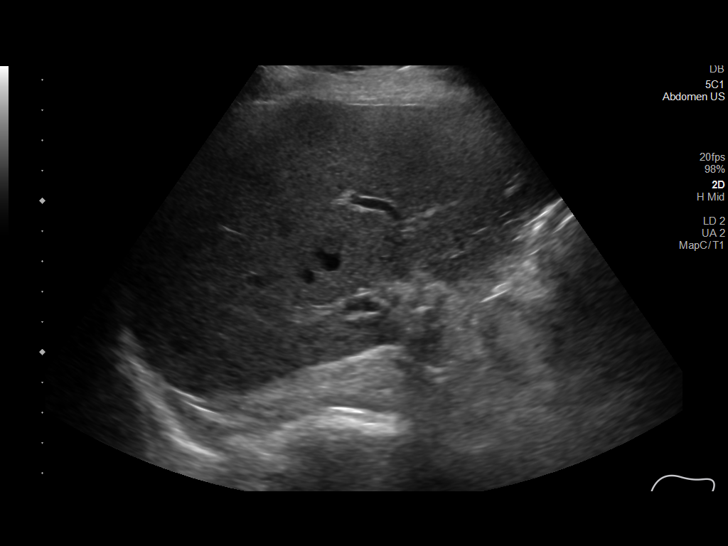
[im 60/66]
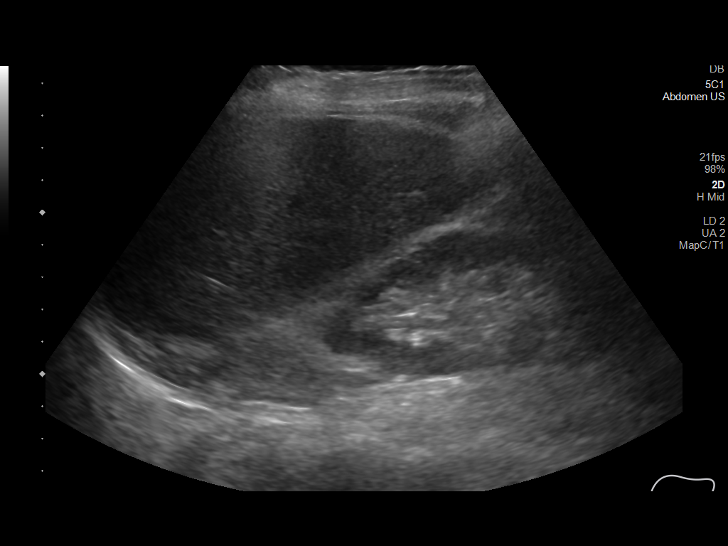
[im 66/66]
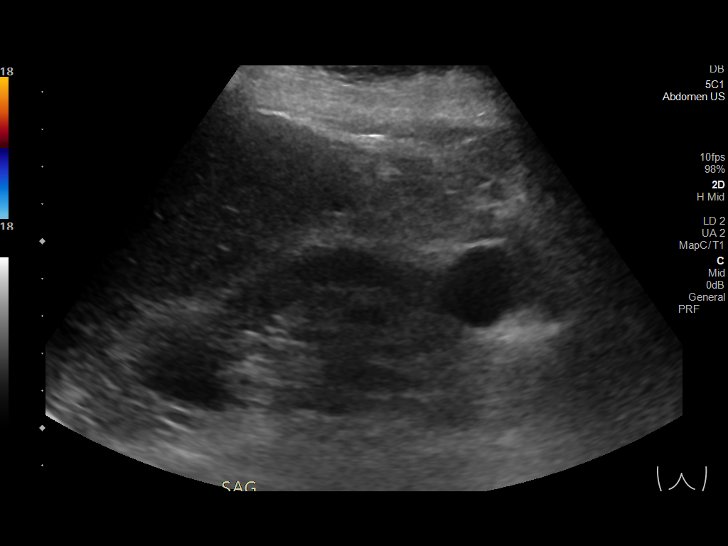

[14 of 25 positions shown; findings below may reference images not displayed]

FINDINGS: Gallbladder:

No gallstones or wall thickening visualized. Trace pericholecystic
fluid. No sonographic Murphy sign noted by sonographer.

Common bile duct:

Diameter: 6 mm

Liver:

No suspicious focal lesion identified. Simple cyst of the right
lower the liver measuring 1.1 x 1.0 x 1.0 cm. Within normal limits
in parenchymal echogenicity. Portal vein is patent on color Doppler
imaging with normal direction of blood flow towards the liver.

Other: Incidental note is made of a simple cyst of the lower pole
the right kidney with a single septation measuring 2.1 x 2.4 x
cm.
IMPRESSION: Trace pericholecystic fluid with no evidence of gallbladder wall
thickening and negative sonographic Murphy sign, findings are
equivocal for acute cholecystitis. HIDA scan could be performed if
there is continued clinical concern.

## 2021-07-16 MED ORDER — MORPHINE SULFATE (PF) 4 MG/ML IV SOLN
4.0000 mg | Freq: Once | INTRAVENOUS | Status: AC
  Administered 2021-07-16: 06:00:00 4 mg via INTRAVENOUS
  Filled 2021-07-16: qty 1

## 2021-07-16 MED ORDER — MORPHINE SULFATE (PF) 4 MG/ML IV SOLN
4.0000 mg | Freq: Once | INTRAVENOUS | Status: AC
  Administered 2021-07-16: 18:00:00 4 mg via INTRAVENOUS
  Filled 2021-07-16: qty 1

## 2021-07-16 MED ORDER — ONDANSETRON HCL 4 MG/2ML IJ SOLN
4.0000 mg | Freq: Once | INTRAMUSCULAR | Status: AC
  Administered 2021-07-16: 06:00:00 4 mg via INTRAVENOUS
  Filled 2021-07-16: qty 2

## 2021-07-16 MED ORDER — ONDANSETRON 4 MG PO TBDP: 4.0000 mg | ORAL_TABLET | Freq: Once | ORAL | Status: DC

## 2021-07-16 MED ORDER — IOHEXOL 300 MG/ML  SOLN
100.0000 mL | Freq: Once | INTRAMUSCULAR | Status: AC | PRN
  Administered 2021-07-16: 09:00:00 100 mL via INTRAVENOUS

## 2021-07-16 MED ORDER — OXYCODONE-ACETAMINOPHEN 5-325 MG PO TABS: 1.0000 | ORAL_TABLET | Freq: Once | ORAL | Status: DC

## 2021-07-16 MED ORDER — HYDROCODONE-ACETAMINOPHEN 5-325 MG PO TABS: 1.0000 | ORAL_TABLET | Freq: Four times a day (QID) | ORAL | 0 refills | Status: DC | PRN

## 2021-07-16 MED ORDER — KETOROLAC TROMETHAMINE 30 MG/ML IJ SOLN
30.0000 mg | Freq: Once | INTRAMUSCULAR | Status: AC
Start: 2021-07-16 — End: 2021-07-16
  Administered 2021-07-16: 18:00:00 30 mg via INTRAVENOUS
  Filled 2021-07-16: qty 1

## 2021-07-16 NOTE — ED Provider Notes (Signed)
[facility] EMERGENCY DEPARTMENT Provider Note   CSN: [id number] Arrival date & time: 07/15/21  2323     History  Chief Complaint  Patient presents with   Abdominal Pain    UTI    Desiree Ortega is a 76 y.o. female.  76 year old female presents today for evaluation of persistent abdominal pain, chills, nausea without vomiting.  Patient was evaluated in the emergency room on Monday and discharge on antibiotics for UTI treatment.  Patient reports since then her pain is remained severe, also endorses pain in bilateral hips making it difficult for her to sit, as well as pain in bilateral armpits that travel down bilateral arms with hand numbness.  She denies dysuria, urinary frequency however does report she has had a couple accidents which is not unusual for her.  She reports in the past couple days she has not had a bowel movement because she has not eaten much given multiple emergency room visits.  She does endorse lack of appetite but reports that is chronic as she typically only eats when is time to eat not because she is hungry.  She was discharged with Vicodin on Monday reports that gave her some relief but still notes the effects wore off the pain returns and is worse than before.  She describes abdominal pain as a sharp shooting pain that starts on the left side extensively across to the right.   Abdominal Pain Associated symptoms: chills and fever   Associated symptoms: no chest pain, no cough and no shortness of breath       Home Medications Prior to Admission medications   Medication Sig Start Date End Date Taking? Authorizing Provider  acetaminophen (TYLENOL) 500 MG tablet Take 500 mg by mouth 2 (two) times daily as needed (for pain.).    [provider]  Ascorbic Acid (VITAMIN C) 1000 MG tablet Take 1 tablet (1,000 mg total) by mouth daily. 08/21/18   Tonia Ghent, MD  b complex vitamins tablet Take 1 tablet by mouth daily.    [provider]  benzonatate (TESSALON) 200 MG capsule Take 1 capsule (200 mg total) by mouth 3 (three) times daily as needed for cough. 12/11/20   Copland, Frederico Hamman, MD  cephALEXin (KEFLEX) 500 MG capsule Take 1 capsule (500 mg total) by mouth 4 (four) times daily. 07/14/21   Lacretia Leigh, MD  Cholecalciferol (VITAMIN D PO) Take by mouth.    [provider]  CINNAMON PO Take 1,000 mg by mouth daily.    [provider]  Cyanocobalamin (B-12 PO) Take by mouth.    [provider]  fexofenadine (ALLEGRA) 180 MG tablet Take 1 tablet (180 mg total) by mouth as needed. Seasonal allergies from Spring to Fall 08/21/18   Tonia Ghent, MD  Flaxseed, Linseed, (FLAX SEEDS PO) Take by mouth.    [provider]  HYDROcodone-acetaminophen (NORCO/VICODIN) 5-325 MG tablet Take 1-2 tablets by mouth every 4 (four) hours as needed. 07/14/21   Lacretia Leigh, MD  Magnesium 250 MG TABS Take 250 mg by mouth daily.    [provider]  Melatonin 3 MG TABS Take 1 tablet (3 mg total) by mouth at bedtime as needed. 08/21/18   Tonia Ghent, MD  meloxicam (MOBIC) 15 MG tablet TAKE 1 TABLET BY MOUTH ONCE DAILY WITH FOOD (DO  NOT  TAKE  WITH  ALEVE  OR  IBUPROFEN) 01/01/21   Tonia Ghent, MD  Multiple Vitamin (MULTIVITAMIN) capsule Take  1 capsule by mouth daily. 08/21/18   Tonia Ghent, MD  Omega-3 Fatty Acids (FISH OIL PO) Take by mouth.    [provider]  Red Yeast Rice Extract 600 MG CAPS Take 1 capsule (600 mg total) by mouth daily. 08/23/20   Tonia Ghent, MD  Turmeric 500 MG CAPS Take by mouth.    [provider]  Ubiquinol 100 MG CAPS Take 100 mg by mouth daily. 08/21/18   Tonia Ghent, MD  Zinc 50 MG CAPS Take by mouth.    [provider]      Allergies    Alendronate sodium, Codeine, and Influenza vaccines    Review of Systems   Review of Systems  Constitutional:  Positive for chills and fever. Negative for activity change.   Respiratory:  Negative for cough and shortness of breath.   Cardiovascular:  Negative for chest pain and palpitations.  Gastrointestinal:  Positive for abdominal pain.  All other systems reviewed and are negative.  Physical Exam Updated Vital Signs BP (!) 163/78 (BP Location: Left Arm)    Pulse 97    Temp 98.1 F (36.7 C) (Oral)    Resp 18    Ht 5\' 6"  (1.676 m)    Wt 74 kg    SpO2 95%    BMI 26.33 kg/m  Physical Exam Vitals and nursing note reviewed.  Constitutional:      General: She is not in acute distress.    Appearance: Normal appearance. She is not ill-appearing.  HENT:     Head: Normocephalic and atraumatic.     Nose: Nose normal.  Eyes:     General: No scleral icterus.    Extraocular Movements: Extraocular movements intact.     Conjunctiva/sclera: Conjunctivae normal.  Cardiovascular:     Rate and Rhythm: Normal rate and regular rhythm.     Pulses: Normal pulses.     Heart sounds: Normal heart sounds.  Pulmonary:     Effort: Pulmonary effort is normal. No respiratory distress.     Breath sounds: Normal breath sounds. No wheezing or rales.  Abdominal:     General: There is no distension.     Palpations: Abdomen is soft.     Tenderness: There is abdominal tenderness (Left lower quadrant, right lower quadrant). There is no right CVA tenderness, left CVA tenderness, guarding or rebound.  Musculoskeletal:        General: Normal range of motion.     Cervical back: Normal range of motion.     Right lower leg: No edema.     Left lower leg: No edema.  Skin:    General: Skin is warm and dry.  Neurological:     General: No focal deficit present.     Mental Status: She is alert. Mental status is at baseline.    ED Results / Procedures / Treatments   Labs (all labs ordered are listed, but only abnormal results are displayed) Labs Reviewed  COMPREHENSIVE METABOLIC PANEL - Abnormal; Notable for the following components:      Result Value   Glucose, Bld 172 (*)    All  other components within normal limits  CBC - Abnormal; Notable for the following components:   WBC 12.2 (*)    RBC 5.44 (*)    Hemoglobin 15.8 (*)    HCT 49.0 (*)    All other components within normal limits  URINALYSIS, ROUTINE W REFLEX MICROSCOPIC - Abnormal; Notable for the following components:   Ketones,  ur 5 (*)    Leukocytes,Ua TRACE (*)    All other components within normal limits  URINE CULTURE  LIPASE, BLOOD  LACTIC ACID, PLASMA  CBC WITH DIFFERENTIAL/PLATELET    EKG EKG Interpretation  Date/Time:  Wednesday July 16 2021 05:30:38 EST Ventricular Rate:  117 PR Interval:  142 QRS Duration: 72 QT Interval:  326 QTC Calculation: 454 R Axis:   74 Text Interpretation: Sinus tachycardia Right atrial enlargement Nonspecific ST abnormality Abnormal ECG When compared with ECG of 14-Jul-2021 18:25, ST abnormality is more prominent Confirmed by Delora Fuel ([id number]) on 07/16/2021 7:32:51 AM  Radiology CT Abdomen Pelvis Wo Contrast  Result Date: 07/14/2021 CLINICAL DATA:  Abdominal pain, nausea EXAM: CT ABDOMEN AND PELVIS WITHOUT CONTRAST TECHNIQUE: Multidetector CT imaging of the abdomen and pelvis was performed following the standard protocol without IV contrast. RADIATION DOSE REDUCTION: This exam was performed according to the departmental dose-optimization program which includes automated exposure control, adjustment of the mA and/or kV according to patient size and/or use of iterative reconstruction technique. COMPARISON:  None. FINDINGS: Lower chest: No acute abnormality. Hepatobiliary: Small hypodensities scattered throughout the liver, difficult to characterize due to their small size and lack of IV contrast but most likely cysts. Gallbladder unremarkable. Pancreas: No focal abnormality or ductal dilatation. Spleen: No focal abnormality.  Normal size. Adrenals/Urinary Tract: 2.7 cm cyst in the midpole of the right kidney. No stones or hydronephrosis. Adrenal glands and urinary  bladder unremarkable. Stomach/Bowel: Colonic diverticulosis. No active diverticulitis. Normal appendix. Stomach and small bowel grossly unremarkable. Vascular/Lymphatic: Aortic atherosclerosis. No evidence of aneurysm or adenopathy. Reproductive: Prior hysterectomy.  No adnexal masses. Other: No free fluid or free air. Musculoskeletal: No acute bony abnormality. IMPRESSION: Diffuse colonic diverticulosis.  No active diverticulitis. Aortic atherosclerosis. No acute findings. Electronically Signed   By: Rolm Baptise M.D.   On: 07/14/2021 20:54   CT Abdomen Pelvis W Contrast  Result Date: 07/16/2021 CLINICAL DATA:  Abdominal pain, acute, nonlocalized. EXAM: CT ABDOMEN AND PELVIS WITH CONTRAST TECHNIQUE: Multidetector CT imaging of the abdomen and pelvis was performed using the standard protocol following bolus administration of intravenous contrast. RADIATION DOSE REDUCTION: This exam was performed according to the departmental dose-optimization program which includes automated exposure control, adjustment of the mA and/or kV according to patient size and/or use of iterative reconstruction technique. CONTRAST:  175mL OMNIPAQUE IOHEXOL 300 MG/ML  SOLN COMPARISON:  CT abdomen pelvis without contrast 07/14/2021 FINDINGS: Lower chest: The lung bases are clear without focal nodule, mass, or airspace disease. Heart size is normal. No significant pleural or pericardial effusion is present. Hepatobiliary: 7 mm simple cyst is present in the liver on image 12 of series 3 no other focal hepatic lesions are present. Stranding is noted about the gallbladder with potential pericholecystic fluid, not present 2 days ago. The common bile duct is within normal limits for age. Pancreas: Unremarkable. No pancreatic ductal dilatation or surrounding inflammatory changes. Spleen: Normal in size without focal abnormality. Adrenals/Urinary Tract: An 8 mm simple cyst is present in the left kidney on image 13 of series 8. Lower pole  exophytic cyst is present in the right kidney on image 18, measuring 29 mm. No other solid lesions are present. No stone or obstruction is present. Ureters are within normal limits bilaterally. The urinary bladder is unremarkable. Stomach/Bowel: Stomach and duodenum are within normal limits. Small bowel is unremarkable. Terminal ileum is within normal limits. The appendix is visualized and normal. Diverticular changes are present in the ascending colon  and distal descending and sigmoid colon without inflammation to suggest diverticulitis. Colon is otherwise unremarkable. Vascular/Lymphatic: Atherosclerotic calcifications are present in the aorta and branch vessels without aneurysm. No significant adenopathy is present. Reproductive: Status post hysterectomy. No adnexal masses. Other: No abdominal wall hernia or abnormality. No abdominopelvic ascites. Musculoskeletal: Degenerative endplate changes are greatest on the right at L3-4 secondary to leftward curvature. Vertebral body heights otherwise within normal limits. Are left cyst noted at S2. Facet degenerative changes are greatest at L5-S1. No focal lytic or blastic lesions are present. IMPRESSION: 1. Stranding about the gallbladder with potential pericholecystic fluid, not present 2 days ago. This raises concern for acute cholecystitis. Right upper quadrant ultrasound could be used for further evaluation. 2. Colonic diverticulosis without diverticulitis. 3. Benign-appearing hepatic and renal cysts. 4. Aortic Atherosclerosis (ICD10-I70.0). Electronically Signed   By: San Morelle M.D.   On: 07/16/2021 09:05   US Abdomen Limited RUQ (LIVER/GB)  Result Date: 07/16/2021 CLINICAL DATA:  Right upper quadrant pain EXAM: ULTRASOUND ABDOMEN LIMITED RIGHT UPPER QUADRANT COMPARISON:  Semi CT abdomen and pelvis dated July 16, 2021 FINDINGS: Gallbladder: No gallstones or wall thickening visualized. Trace pericholecystic fluid. No sonographic Murphy sign noted by  sonographer. Common bile duct: Diameter: 6 mm Liver: No suspicious focal lesion identified. Simple cyst of the right lower the liver measuring 1.1 x 1.0 x 1.0 cm. Within normal limits in parenchymal echogenicity. Portal vein is patent on color Doppler imaging with normal direction of blood flow towards the liver. Other: Incidental note is made of a simple cyst of the lower pole the right kidney with a single septation measuring 2.1 x 2.4 x 2.6 cm. IMPRESSION: Trace pericholecystic fluid with no evidence of gallbladder wall thickening and negative sonographic Murphy sign, findings are equivocal for acute cholecystitis. HIDA scan could be performed if there is continued clinical concern. Electronically Signed   By: Yetta Glassman M.D.   On: 07/16/2021 12:14    Procedures Procedures    Medications Ordered in ED Medications  oxyCODONE-acetaminophen (PERCOCET/ROXICET) 5-325 MG per tablet 1 tablet (1 tablet Oral Given 07/15/21 2349)  morphine 4 MG/ML injection 4 mg (4 mg Intravenous Given 07/16/21 0544)  ondansetron (ZOFRAN) injection 4 mg (4 mg Intravenous Given 07/16/21 0543)  iohexol (OMNIPAQUE) 300 MG/ML solution 100 mL (100 mLs Intravenous Contrast Given 07/16/21 0844)    ED Course/ Medical Decision Making/ A&P Clinical Course as of 07/16/21 1854  Wed Jul 16, 2021  1850 This is a 76 year old female presenting to emergency department with lower abdominal pain and bilateral leg pain.  Patient reports that she had onset of her symptoms abruptly a few days ago, prompting a visit to the ER 2 days ago on January 23, at which point she had a CT scan of the abdomen which is largely unremarkable.  She was diagnosed with a possible UTI, with only trace leuks on her UA, started on Keflex.  She did take 1 dose but feels that she developed a rash may have had an allergic reaction to it.  No urine culture was sent at the time.  She comes back yesterday evening into the ED complaining of persistent lower abdominal  pain, also feeling she is having pain traveling down the back of both of her legs, worse with standing.  She reported some nausea 2 days ago, no persistent nausea or vomiting.  On exam she had very mild lower abdominal tenderness, no focal right upper quadrant tenderness, no rebound or guarding.  She had no  pulsatile abdominal mass.  She did have reproducible pain in her legs with straight leg test. [MT]  1851 Patient initially had a normal lactate of 1.8, had a very mild leukocytosis which showed some slight improvement with repeat checking over 12 hours later.  UA again did not show convincing signs of UTI, we will send a urine culture, but I think it is reasonable discontinue her Keflex at this time.  Right upper quadrant ultrasound and CT scan that were performed today did show some possibility of pericholecystic fluid.  General surgery was consulted, and per his evaluation, felt it was extremely unlikely that the patient's symptomatology was coming from her gallbladder.  The general surgeon did not recommend any gallbladder intervention or cholecystectomy.  The patient was reassessed, pain improved with medications given in the ED.  Advised to continue treating for possible sciatica.  She can continue with Norco and NSAIDs as needed at home.  Return precautions were discussed.  Overall, my clinical suspicion for mesenteric ischemia, AAA, colitis, or other emergent abdominal process was low at this time. [MT]    Clinical Course User Index [MT] Trifan, Carola Rhine, MD                           Medical Decision Making Amount and/or Complexity of Data Reviewed Labs: ordered.  Risk Prescription drug management.   Medical Decision Making / ED Course   This patient presents to the ED for concern of abdominal pain, this involves an extensive number of treatment options, and is a complaint that carries with it a high risk of complications and morbidity.  The differential diagnosis includes appendicitis,  acute cholecystitis, diverticulitis, other acute intra-abdominal process, ACS, pneumonia,  MDM: 76 year old female presents today for evaluation of worsening abdominal pain with associated nausea and chills of 3-day duration.  Patient was evaluated in the emergency room on Monday with unremarkable CT scan at that time.  Patient was started on treatment for UTI and given Vicodin for abdominal pain.  Patient reports Vicodin transiently helps her abdominal pain and she has no improvement in her symptoms.  CT scan this visit does show signs concerning for acute cholecystitis.  Right upper quadrant ultrasound shows equivocal findings of acute cholecystitis.  Mild leukocytosis of 12.2.  CBC otherwise without acute findings.  CMP with glucose 172 otherwise without acute concern.  UA with trace leukocytes and nitrite negative.  Urine culture pending.  Lactic acid 1.8.  Lipase within normal limits.  We will repeat CBC given that was collected around 2300 last night to follow trend of leukocytosis.  Will discuss case with general surgery to discuss imaging findings.  Patient denies complaints of cough, shortness of breath or other URI symptoms to raise concern for pneumonia.  Patient denies chest pain however she does report bilateral axilla pain that radiates down both arms with numbness.  EKG without acute ischemic changes.  No chest pain, shortness of breath.  Unlikely to be ACS.  Patient reports the pain she has in lower extremities has occurred in the past with exercise.  This could be sciatica.  Discussed offering steroid injection or prednisone Dosepak however patient reports this is not bothersome enough to worry about treating.  Patient evaluated by general surgeon Dr. Rosendo Gros who after reviewing CT scan and evaluating patient does not feel this is acute cholecystitis.  Given that there is no concern for acute intra-abdominal process.  Discussed with patient who is agreeable for discharge  and close follow-up  with primary care provider.  Patient has 4 tablets of Norco left at home will provide few additional doses until she can follow-up with her primary care provider.  Patient's UA does not show significant concern for UTI.  Will send urine culture.  It is okay for patient to stop Keflex in the meantime.  COVID/flu test ordered prior to discharge.  Patient does not want to wait for this and will review results on MyChart.  This is appropriate and patient is okay for discharge.  This likely would not change management.     Additional history obtained: -Additional history obtained from daughter Joellen Jersey who is at bedside. -External records from outside source obtained and reviewed including: Chart review including previous notes, labs, imaging, consultation notes   Lab Tests: -I ordered, reviewed, and interpreted labs.   The pertinent results include:   Labs Reviewed  COMPREHENSIVE METABOLIC PANEL - Abnormal; Notable for the following components:      Result Value   Glucose, Bld 172 (*)    All other components within normal limits  CBC - Abnormal; Notable for the following components:   WBC 12.2 (*)    RBC 5.44 (*)    Hemoglobin 15.8 (*)    HCT 49.0 (*)    All other components within normal limits  URINALYSIS, ROUTINE W REFLEX MICROSCOPIC - Abnormal; Notable for the following components:   Ketones, ur 5 (*)    Leukocytes,Ua TRACE (*)    All other components within normal limits  URINE CULTURE  LIPASE, BLOOD  LACTIC ACID, PLASMA  CBC WITH DIFFERENTIAL/PLATELET      EKG  EKG Interpretation  Date/Time:  Wednesday July 16 2021 05:30:38 EST Ventricular Rate:  117 PR Interval:  142 QRS Duration: 72 QT Interval:  326 QTC Calculation: 454 R Axis:   74 Text Interpretation: Sinus tachycardia Right atrial enlargement Nonspecific ST abnormality Abnormal ECG When compared with ECG of 14-Jul-2021 18:25, ST abnormality is more prominent Confirmed by Delora Fuel ([id number]) on 07/16/2021 7:32:51  AM         Imaging Studies ordered: I ordered imaging studies including CT abdomen and pelvis.  Right upper quadrant ultrasound I independently visualized and interpreted imaging. I agree with the radiologist interpretation   Medicines ordered and prescription drug management: Meds ordered this encounter  Medications   oxyCODONE-acetaminophen (PERCOCET/ROXICET) 5-325 MG per tablet 1 tablet   DISCONTD: oxyCODONE-acetaminophen (PERCOCET/ROXICET) 5-325 MG per tablet 1 tablet   DISCONTD: ondansetron (ZOFRAN-ODT) disintegrating tablet 4 mg   morphine 4 MG/ML injection 4 mg   ondansetron (ZOFRAN) injection 4 mg   iohexol (OMNIPAQUE) 300 MG/ML solution 100 mL    -I have reviewed the patients home medicines and have made adjustments as needed  Consultations Obtained: I requested consultation with the general surgery,  and discussed lab and imaging findings as well as pertinent plan - they recommend: Exam and imaging not concerning for acute cholecystitis and patient is okay for discharge from their standpoint.   Reevaluation: After the interventions noted above, I reevaluated the patient and found that they have :improved Patient reports significant improvement in her symptoms since the time of arrival.  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 Evorn Gong   Diverticulosis    Moderate   Female bladder prolapse    H/O cold sores  History of colon polyps    History of kidney stones    Hyperlipidemia    Migraine with aura    Osteopenia    prev on fosamax for a few years then started evista at age ~52, T score improved  from -2.29/-1.96 to -1.8/-1.7 as of 06/2011, repeat DXA done 2015      Dispostion: Patient is appropriate for discharge.  Patient discharged in stable condition.  Discussed importance of follow-up with primary care  provider.  Patient voices understanding and is in agreement with plan.   Final Clinical Impression(s) / ED Diagnoses Final diagnoses:  RUQ abdominal pain  Generalized abdominal pain    Rx / DC Orders ED Discharge Orders          Ordered    HYDROcodone-acetaminophen (NORCO/VICODIN) 5-325 MG tablet  Every 6 hours PRN        07/16/21 1908              Evlyn Courier, PA-C 07/16/21 2022    Wyvonnia Dusky, MD 07/17/21 [phone]

## 2021-07-16 NOTE — Discharge Instructions (Addendum)
Your work-up today was reassuring against serious cause of your abdominal pain.  There was concern for infection of your gallbladder and surgeon was consulted who evaluated you in the emergency room and does not feel that that is the source of your symptoms.  Your infection count on your blood work has remained stable without significant increase.  You have remained without a fever in the emergency room.  CT scan did not show any other concerns.  You are getting improvement in your pain with the pain medicine.  You state you have 4 tablets left at home.  I will send in a few additional doses to the pharmacy for you.  As discussed please follow-up with your primary care provider this week if possible to be reevaluated.  If your symptoms worsen please return to the emergency room for evaluation.  Your urine also does not show significant signs of a UTI.  You can stop antibiotic you are prescribed 2 days ago.  We have sent a urine culture if this shows concern for an infection we can call and get you started on antibiotics at that time. He did have a COVID test performed in the emergency room.  You decided not to wait for this and he will follow-up on your results on MyChart.

## 2021-07-16 NOTE — Consult Note (Signed)
Reason for Consult: Abdominal pain Referring Physician: Dr. Cipriano Mile Desiree Ortega is an 76 y.o. female.  HPI: Patient is a 76 year old female who comes in secondary to abdominal pain since Monday.  She states that she has had abdominal pain that radiates down to her legs.  She states this also radiates down her back and lower legs when she lays down.  Patient states that she had some nausea no emesis, no diarrhea, fever to 100 degrees, no chills.    Patient was seen yesterday in the ER and underwent laboratory studies and CT scan.  There were no signs of gallstones, no thickening.  Patient was sent home with antibiotics secondary to UTI.  Patient underwent ultrasound and CT scan today.  This again showed no gallstones and some minimal pericholecystic fluid.  Patient with leukocytosis, and no elevation on her liver enzymes.  General surgery was consulted for further evaluation.  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 Evorn Gong   Diverticulosis    Moderate   Female bladder prolapse    H/O cold sores    History of colon polyps    History of kidney stones    Hyperlipidemia    Migraine with aura    Osteopenia    prev on fosamax for a few years then started evista at age ~60, T score improved  from -2.29/-1.96 to -1.8/-1.7 as of 06/2011, repeat DXA done 2015    Past Surgical History:  Procedure Laterality Date   BREAST BIOPSY Left    benign   CATARACT EXTRACTION W/ INTRAOCULAR LENS IMPLANT Left 11/2016   CATARACT EXTRACTION W/ INTRAOCULAR LENS IMPLANT Right 12/22/2016   COLONOSCOPY  08/15/2013   cyst on back     2 times   cyst on eyelid     right eye   CYSTOCELE REPAIR N/A 02/01/2018   Procedure: ANTERIOR REPAIR (CYSTOCELE);  Surgeon: Bjorn Loser, MD;  Location: [facility];  Service: Urology;  Laterality: N/A;   CYSTOSCOPY N/A 02/01/2018   Procedure: cystoscopy;  Surgeon: Bjorn Loser, MD;  Location: [facility];  Service: Urology;  Laterality: N/A;   LAPAROSCOPIC VAGINAL HYSTERECTOMY WITH SALPINGO OOPHORECTOMY Bilateral 02/01/2018   Procedure: LAPAROSCOPIC ASSISTED VAGINAL HYSTERECTOMY WITH BILATERAL SALPINGO OOPHORECTOMY;  Surgeon: Servando Salina, MD;  Location: [facility];  Service: Gynecology;  Laterality: Bilateral;   SQUAMOUS CELL CARCINOMA EXCISION     on back/removed 2 times/and thigh   SQUAMOUS CELL CARCINOMA EXCISION Left 07/15/2016   left lower calf   SSC removed  1988   Birthmark removal partial 1/3   TONSILLECTOMY  1950   TUBAL LIGATION  1980's    Family History  Problem Relation Age of Onset   Osteopenia Mother    Hypertension Father    Heart disease Father        AFIB, PVD stents LE's, pacer   Stroke Father    Cancer Brother        prostate, prostatectomy, radiation 5 years later, Hormone Tx   Prostate cancer Brother    Heart disease Brother    Cancer Brother        prostate, prostatectomy   Prostate cancer Brother    Diabetes Other    Cancer Other        Non-Hodgkin's Lymphoma   Depression Daughter    Alcohol abuse Neg Hx    Drug abuse Neg Hx  Colon cancer Neg Hx    Breast cancer Neg Hx    Bladder Cancer Neg Hx    Kidney cancer Neg Hx     Social History:  reports that she quit smoking about 20 years ago. Her smoking use included cigarettes. She has a 18.00 pack-year smoking history. She has never used smokeless tobacco. She reports current alcohol use of about 2.0 standard drinks per week. She reports that she does not use drugs.  Allergies:  Allergies  Allergen Reactions   Alendronate Sodium Other (See Comments)    heartburn   Codeine Nausea Only   Influenza Vaccines     Local reaction to vaccine in 2014    Medications: I have reviewed the patient's current medications.  Results for orders placed or performed during the hospital encounter of 07/15/21 (from the past 48 hour(s))  Lipase, blood     Status: None   Collection  Time: 07/15/21 11:55 PM  Result Value Ref Range   Lipase 28 11 - 51 U/L    Comment: Performed at [facility], [place] [address], [place], [place] [address]  Comprehensive metabolic panel     Status: Abnormal   Collection Time: 07/15/21 11:55 PM  Result Value Ref Range   Sodium 135 135 - 145 mmol/L   Potassium 3.8 3.5 - 5.1 mmol/L   Chloride 104 98 - 111 mmol/L   CO2 24 22 - 32 mmol/L   Glucose, Bld 172 (H) 70 - 99 mg/dL    Comment: Glucose reference range applies only to samples taken after fasting for at least 8 hours.   BUN 12 8 - 23 mg/dL   Creatinine, Ser 0.78 0.44 - 1.00 mg/dL   Calcium 9.2 8.9 - 10.3 mg/dL   Total Protein 7.1 6.5 - 8.1 g/dL   Albumin 3.9 3.5 - 5.0 g/dL   AST 20 15 - 41 U/L   ALT 13 0 - 44 U/L   Alkaline Phosphatase 69 38 - 126 U/L   Total Bilirubin 0.8 0.3 - 1.2 mg/dL   GFR, Estimated >60 >60 mL/min    Comment: (NOTE) Calculated using the CKD-EPI Creatinine Equation (2021)    Anion gap 7 5 - 15    Comment: Performed at [facility] [address] [place], [place] [address]  CBC     Status: Abnormal   Collection Time: 07/15/21 11:55 PM  Result Value Ref Range   WBC 12.2 (H) 4.0 - 10.5 K/uL   RBC 5.44 (H) 3.87 - 5.11 MIL/uL   Hemoglobin 15.8 (H) 12.0 - 15.0 g/dL   HCT 49.0 (H) 36.0 - 46.0 %   MCV 90.1 80.0 - 100.0 fL   MCH 29.0 26.0 - 34.0 pg   MCHC 32.2 30.0 - 36.0 g/dL   RDW 12.5 11.5 - 15.5 %   Platelets 281 150 - 400 K/uL   nRBC 0.0 0.0 - 0.2 %    Comment: Performed at [facility], [place] [address], [place], [place] [address]  Urinalysis, Routine w reflex microscopic Urine, Clean Catch     Status: Abnormal   Collection Time: 07/16/21 12:36 AM  Result Value Ref Range   Color, Urine YELLOW YELLOW   APPearance CLEAR CLEAR   Specific Gravity, Urine 1.016 1.005 - 1.030   pH 6.0 5.0 - 8.0   Glucose, UA NEGATIVE NEGATIVE mg/dL   Hgb urine dipstick NEGATIVE NEGATIVE   Bilirubin Urine NEGATIVE NEGATIVE   Ketones, ur 5 (A) NEGATIVE  mg/dL  Protein, ur NEGATIVE NEGATIVE mg/dL   Nitrite NEGATIVE NEGATIVE   Leukocytes,Ua TRACE (A) NEGATIVE   RBC / HPF 0-5 0 - 5 RBC/hpf   WBC, UA 0-5 0 - 5 WBC/hpf   Bacteria, UA NONE SEEN NONE SEEN   Squamous Epithelial / LPF 0-5 0 - 5   Mucus PRESENT     Comment: Performed at [facility], [place] [address] [place], [place] [address]  Lactic acid, plasma     Status: None   Collection Time: 07/16/21  6:13 AM  Result Value Ref Range   Lactic Acid, Venous 1.8 0.5 - 1.9 mmol/L    Comment: Performed at [facility] [address] [place], [place] [address]  CBC with Differential     Status: Abnormal   Collection Time: 07/16/21  5:55 PM  Result Value Ref Range   WBC 11.7 (H) 4.0 - 10.5 K/uL   RBC 5.08 3.87 - 5.11 MIL/uL   Hemoglobin 14.8 12.0 - 15.0 g/dL   HCT 45.7 36.0 - 46.0 %   MCV 90.0 80.0 - 100.0 fL   MCH 29.1 26.0 - 34.0 pg   MCHC 32.4 30.0 - 36.0 g/dL   RDW 12.5 11.5 - 15.5 %   Platelets 243 150 - 400 K/uL   nRBC 0.0 0.0 - 0.2 %   Neutrophils Relative % 63 %   Neutro Abs 7.3 1.7 - 7.7 K/uL   Lymphocytes Relative 29 %   Lymphs Abs 3.4 0.7 - 4.0 K/uL   Monocytes Relative 8 %   Monocytes Absolute 0.9 0.1 - 1.0 K/uL   Eosinophils Relative 0 %   Eosinophils Absolute 0.0 0.0 - 0.5 K/uL   Basophils Relative 0 %   Basophils Absolute 0.1 0.0 - 0.1 K/uL   Immature Granulocytes 0 %   Abs Immature Granulocytes 0.04 0.00 - 0.07 K/uL    Comment: Performed at [facility] [address] [place], [place] [address]    CT Abdomen Pelvis Wo Contrast  Result Date: 07/14/2021 CLINICAL DATA:  Abdominal pain, nausea EXAM: CT ABDOMEN AND PELVIS WITHOUT CONTRAST TECHNIQUE: Multidetector CT imaging of the abdomen and pelvis was performed following the standard protocol without IV contrast. RADIATION DOSE REDUCTION: This exam was performed according to the departmental dose-optimization program which includes automated exposure control, adjustment of the mA and/or kV  according to patient size and/or use of iterative reconstruction technique. COMPARISON:  None. FINDINGS: Lower chest: No acute abnormality. Hepatobiliary: Small hypodensities scattered throughout the liver, difficult to characterize due to their small size and lack of IV contrast but most likely cysts. Gallbladder unremarkable. Pancreas: No focal abnormality or ductal dilatation. Spleen: No focal abnormality.  Normal size. Adrenals/Urinary Tract: 2.7 cm cyst in the midpole of the right kidney. No stones or hydronephrosis. Adrenal glands and urinary bladder unremarkable. Stomach/Bowel: Colonic diverticulosis. No active diverticulitis. Normal appendix. Stomach and small bowel grossly unremarkable. Vascular/Lymphatic: Aortic atherosclerosis. No evidence of aneurysm or adenopathy. Reproductive: Prior hysterectomy.  No adnexal masses. Other: No free fluid or free air. Musculoskeletal: No acute bony abnormality. IMPRESSION: Diffuse colonic diverticulosis.  No active diverticulitis. Aortic atherosclerosis. No acute findings. Electronically Signed   By: Rolm Baptise M.D.   On: 07/14/2021 20:54   CT Abdomen Pelvis W Contrast  Result Date: 07/16/2021 CLINICAL DATA:  Abdominal pain, acute, nonlocalized. EXAM: CT ABDOMEN AND PELVIS WITH CONTRAST TECHNIQUE: Multidetector CT imaging of the abdomen and pelvis was performed using the standard protocol following bolus administration of  intravenous contrast. RADIATION DOSE REDUCTION: This exam was performed according to the departmental dose-optimization program which includes automated exposure control, adjustment of the mA and/or kV according to patient size and/or use of iterative reconstruction technique. CONTRAST:  133mL OMNIPAQUE IOHEXOL 300 MG/ML  SOLN COMPARISON:  CT abdomen pelvis without contrast 07/14/2021 FINDINGS: Lower chest: The lung bases are clear without focal nodule, mass, or airspace disease. Heart size is normal. No significant pleural or pericardial effusion  is present. Hepatobiliary: 7 mm simple cyst is present in the liver on image 12 of series 3 no other focal hepatic lesions are present. Stranding is noted about the gallbladder with potential pericholecystic fluid, not present 2 days ago. The common bile duct is within normal limits for age. Pancreas: Unremarkable. No pancreatic ductal dilatation or surrounding inflammatory changes. Spleen: Normal in size without focal abnormality. Adrenals/Urinary Tract: An 8 mm simple cyst is present in the left kidney on image 13 of series 8. Lower pole exophytic cyst is present in the right kidney on image 18, measuring 29 mm. No other solid lesions are present. No stone or obstruction is present. Ureters are within normal limits bilaterally. The urinary bladder is unremarkable. Stomach/Bowel: Stomach and duodenum are within normal limits. Small bowel is unremarkable. Terminal ileum is within normal limits. The appendix is visualized and normal. Diverticular changes are present in the ascending colon and distal descending and sigmoid colon without inflammation to suggest diverticulitis. Colon is otherwise unremarkable. Vascular/Lymphatic: Atherosclerotic calcifications are present in the aorta and branch vessels without aneurysm. No significant adenopathy is present. Reproductive: Status post hysterectomy. No adnexal masses. Other: No abdominal wall hernia or abnormality. No abdominopelvic ascites. Musculoskeletal: Degenerative endplate changes are greatest on the right at L3-4 secondary to leftward curvature. Vertebral body heights otherwise within normal limits. Are left cyst noted at S2. Facet degenerative changes are greatest at L5-S1. No focal lytic or blastic lesions are present. IMPRESSION: 1. Stranding about the gallbladder with potential pericholecystic fluid, not present 2 days ago. This raises concern for acute cholecystitis. Right upper quadrant ultrasound could be used for further evaluation. 2. Colonic  diverticulosis without diverticulitis. 3. Benign-appearing hepatic and renal cysts. 4. Aortic Atherosclerosis (ICD10-I70.0). Electronically Signed   By: San Morelle M.D.   On: 07/16/2021 09:05   US Abdomen Limited RUQ (LIVER/GB)  Result Date: 07/16/2021 CLINICAL DATA:  Right upper quadrant pain EXAM: ULTRASOUND ABDOMEN LIMITED RIGHT UPPER QUADRANT COMPARISON:  Semi CT abdomen and pelvis dated July 16, 2021 FINDINGS: Gallbladder: No gallstones or wall thickening visualized. Trace pericholecystic fluid. No sonographic Murphy sign noted by sonographer. Common bile duct: Diameter: 6 mm Liver: No suspicious focal lesion identified. Simple cyst of the right lower the liver measuring 1.1 x 1.0 x 1.0 cm. Within normal limits in parenchymal echogenicity. Portal vein is patent on color Doppler imaging with normal direction of blood flow towards the liver. Other: Incidental note is made of a simple cyst of the lower pole the right kidney with a single septation measuring 2.1 x 2.4 x 2.6 cm. IMPRESSION: Trace pericholecystic fluid with no evidence of gallbladder wall thickening and negative sonographic Murphy sign, findings are equivocal for acute cholecystitis. HIDA scan could be performed if there is continued clinical concern. Electronically Signed   By: Yetta Glassman M.D.   On: 07/16/2021 12:14    Review of Systems  Constitutional: Negative.  Negative for chills and fever.  HENT: Negative.  Negative for ear discharge, hearing loss and sore throat.   Eyes:  Negative for discharge.  Respiratory: Negative.  Negative for cough and shortness of breath.   Cardiovascular: Negative.  Negative for chest pain and leg swelling.  Gastrointestinal:  Positive for abdominal pain. Negative for constipation, diarrhea, nausea and vomiting.  Musculoskeletal:  Negative for myalgias and neck pain.  Skin:  Negative for rash.  Allergic/Immunologic: Negative for environmental allergies.  Neurological: Negative.   Negative for dizziness and seizures.  Hematological:  Does not bruise/bleed easily.  Psychiatric/Behavioral:  Negative for suicidal ideas.   All other systems reviewed and are negative. Blood pressure (!) 163/78, pulse 97, temperature 98.1 F (36.7 C), temperature source Oral, resp. rate 18, height 5\' 6"  (1.676 m), weight 74 kg, SpO2 95 %. Physical Exam Constitutional:      Appearance: She is well-developed.     Comments: Conversant No acute distress  HENT:     Head: Normocephalic and atraumatic.  Eyes:     General: Lids are normal. No scleral icterus.    Pupils: Pupils are equal, round, and reactive to light.     Comments: Pupils are equal round and reactive No lid lag Moist conjunctiva  Neck:     Thyroid: No thyromegaly.     Trachea: No tracheal tenderness.     Comments: No cervical lymphadenopathy Cardiovascular:     Rate and Rhythm: Normal rate and regular rhythm.     Heart sounds: No murmur heard. Pulmonary:     Effort: Pulmonary effort is normal.     Breath sounds: Normal breath sounds. No wheezing or rales.  Abdominal:     Tenderness: There is no abdominal tenderness. There is no guarding or rebound. Negative signs include Murphy's sign.     Hernia: No hernia is present.  Musculoskeletal:     Cervical back: Normal range of motion and neck supple.  Skin:    General: Skin is warm.     Findings: No rash.     Nails: There is no clubbing.     Comments: Normal skin turgor  Neurological:     Mental Status: She is alert and oriented to person, place, and time.     Comments: Normal gait and station  Psychiatric:        Mood and Affect: Mood normal.        Thought Content: Thought content normal.        Judgment: Judgment normal.     Comments: Appropriate affect    Assessment/Plan: 76 year old female with generalized abdominal pain No signs of cholecystitis on exam or ultrasound/CT scan. Patient may have ileus secondary to UTI.  No surgical plans at this  time.  Patient can follow-up as needed.  Ralene Ok 07/16/2021, 6:50 PM

## 2021-07-16 NOTE — ED Provider Notes (Signed)
Patient reassessed.  Seems to be having pain, which of proportion to abdominal exam.  Will obtain lactic acid.  I will also provide with Zofran and morphine considering patient is complaining of severe pain and nausea.   Desiree Ortega [place], [place] 28/63/81 [id number]    Delora Fuel, MD 65/79/03 [phone]

## 2021-07-17 LAB — URINE CULTURE: Culture: NO GROWTH

## 2021-07-18 ENCOUNTER — Other Ambulatory Visit: Payer: Self-pay

## 2021-07-18 ENCOUNTER — Ambulatory Visit (INDEPENDENT_AMBULATORY_CARE_PROVIDER_SITE_OTHER): Payer: Medicare HMO | Admitting: Nurse Practitioner

## 2021-07-18 VITALS — BP 184/96 | HR 86 | Temp 98.2°F | Resp 16 | Ht 66.0 in | Wt 146.2 lb

## 2021-07-18 DIAGNOSIS — M5431 Sciatica, right side: Secondary | ICD-10-CM | POA: Diagnosis not present

## 2021-07-18 DIAGNOSIS — M5432 Sciatica, left side: Secondary | ICD-10-CM | POA: Diagnosis not present

## 2021-07-18 MED ORDER — PREDNISONE 20 MG PO TABS: ORAL_TABLET | ORAL | 0 refills | Status: AC

## 2021-07-18 NOTE — Telephone Encounter (Signed)
Noted, thanks.  Will await the consult note.

## 2021-07-18 NOTE — Progress Notes (Signed)
Established Patient Office Visit  Subjective:  Patient ID: Desiree Ortega, female    DOB: 05-14-1946  Age: 76 y.o. MRN: [id number]  CC:  Chief Complaint  Patient presents with   ER follow up    Went to ER x 2 the last few days. Feels the same and maybe a little worse. Headache present now. Pain in both legs, gets a sharp/shooting pain and makes her legs give out. Pain in the lower and top part of the back.     HPI Desiree Ortega presents for ED follow up  Patient went to emergency department on 07/14/2021 for abdominal pain. She underwent lab work and abdominal CT that was largely benign. She was discharged on antibiotics for possible UTI  Patient went back to ED on 07/16/2021 for continued pain and symptoms. They did a repeat CT abdomin with contrast this time. No acute findings for her abdominal pain. They thought her discomfort was related to her back and discharged her with oral pain medication and instructed her to stop the oral abx and sent her urine off for culture. The CT did show possible choly fluid. General surgery was consulted and cleared patient from having an acute gallbladder issue  Urine culture and respiratory panel both resulted negative  Pain is intermittent in nature and started on the left side of her body around her hip. Then she would have shooting pain across her stomach, up through her chest and to her arms. Had ekgs each time in the emergency department   Has been given hydrocodone and states that it was not very beneficial. 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 Did review last CT abdomen with patient verbally in office.    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 Evorn Gong   Diverticulosis    Moderate   Female bladder prolapse    H/O cold sores    History of colon  polyps    History of kidney stones    Hyperlipidemia    Migraine with aura    Osteopenia    prev on fosamax for a few years then started evista at age ~27, T score improved  from -2.29/-1.96 to -1.8/-1.7 as of 06/2011, repeat DXA done 2015    Past Surgical History:  Procedure Laterality Date   BREAST BIOPSY Left    benign   CATARACT EXTRACTION W/ INTRAOCULAR LENS IMPLANT Left 11/2016   CATARACT EXTRACTION W/ INTRAOCULAR LENS IMPLANT Right 12/22/2016   COLONOSCOPY  08/15/2013   cyst on back     2 times   cyst on eyelid     right eye   CYSTOCELE REPAIR N/A 02/01/2018   Procedure: ANTERIOR REPAIR (CYSTOCELE);  Surgeon: Bjorn Loser, MD;  Location: [facility];  Service: Urology;  Laterality: N/A;   CYSTOSCOPY N/A 02/01/2018   Procedure: cystoscopy;  Surgeon: Bjorn Loser, MD;  Location: [facility];  Service: Urology;  Laterality: N/A;   LAPAROSCOPIC VAGINAL HYSTERECTOMY WITH SALPINGO OOPHORECTOMY Bilateral 02/01/2018   Procedure: LAPAROSCOPIC ASSISTED VAGINAL HYSTERECTOMY WITH BILATERAL SALPINGO OOPHORECTOMY;  Surgeon: Servando Salina, MD;  Location: [facility];  Service: Gynecology;  Laterality: Bilateral;   SQUAMOUS CELL CARCINOMA EXCISION     on back/removed 2 times/and thigh   SQUAMOUS CELL CARCINOMA EXCISION Left 07/15/2016   left lower calf  SSC removed  1988   Birthmark removal partial 1/3   [place]  1980's    Family History  Problem Relation Age of Onset   Osteopenia Mother    Hypertension Father    Heart disease Father        AFIB, PVD stents LE's, pacer   Stroke Father    Cancer Brother        prostate, prostatectomy, radiation 5 years later, Hormone Tx   Prostate cancer Brother    Heart disease Brother    Cancer Brother        prostate, prostatectomy   Prostate cancer Brother    Diabetes Other    Cancer Other        Non-Hodgkin's Lymphoma   Depression Daughter    Alcohol abuse Neg Hx    Drug abuse Neg Hx    Colon cancer Neg Hx     Breast cancer Neg Hx    Bladder Cancer Neg Hx    Kidney cancer Neg Hx     Social History   Socioeconomic History   Marital status: Married    Spouse name: Not on file   Number of children: 4   Years of education: Not on file   Highest education level: Not on file  Occupational History   Occupation: [employer], [employer]    Comment: Teaches swimming  Tobacco Use   Smoking status: Former    Packs/day: 0.50    Years: 36.00    Pack years: 18.00    Types: Cigarettes    Quit date: 06/22/2001    Years since quitting: 20.0   Smokeless tobacco: Never  Vaping Use   Vaping Use: Never used  Substance and Sexual Activity   Alcohol use: Yes    Alcohol/week: 2.0 standard drinks    Types: 2 Glasses of wine per week    Comment: occasionally   Drug use: No   Sexual activity: Yes    Birth control/protection: Post-menopausal  Other Topics Concern   Not on file  Social History Narrative   Married 1963, lives with husband   C[employer] and [employer] fan   Mother in ALF as of 2022 (pt's mother was born in [place])   11 daughters   Taught swimming prev   Social Determinants of Health   Financial Resource Strain: Low Risk    Difficulty of Paying Living Expenses: Not hard at all  Food Insecurity: No Food Insecurity   Worried About Charity fundraiser in the Last Year: Never true   Arboriculturist in the Last Year: Never true  Transportation Needs: No Transportation Needs   Lack of Transportation (Medical): No   Lack of Transportation (Non-Medical): No  Physical Activity: Inactive   Days of Exercise per Week: 0 days   Minutes of Exercise per Session: 0 min  Stress: No Stress Concern Present   Feeling of Stress : Not at all  Social Connections: Not on file  Intimate Partner Violence: Not At Risk   Fear of Current or Ex-Partner: No   Emotionally Abused: No   Physically Abused: No   Sexually Abused: No    Outpatient Medications Prior to Visit  Medication Sig Dispense Refill    acetaminophen (TYLENOL) 500 MG tablet Take 500 mg by mouth 2 (two) times daily as needed (for pain.).     Ascorbic Acid (VITAMIN C) 1000 MG tablet Take 1 tablet (1,000 mg total) by mouth daily.  b complex vitamins tablet Take 1 tablet by mouth daily.     Cholecalciferol (VITAMIN D PO) Take by mouth.     CINNAMON PO Take 1,000 mg by mouth daily.     Cyanocobalamin (B-12 PO) Take by mouth.     fexofenadine (ALLEGRA) 180 MG tablet Take 1 tablet (180 mg total) by mouth as needed. Seasonal allergies from Spring to Fall     Flaxseed, Linseed, (FLAX SEEDS PO) Take by mouth.     Magnesium 250 MG TABS Take 250 mg by mouth daily.     Melatonin 3 MG TABS Take 1 tablet (3 mg total) by mouth at bedtime as needed.     Multiple Vitamin (MULTIVITAMIN) capsule Take 1 capsule by mouth daily.     Omega-3 Fatty Acids (FISH OIL PO) Take by mouth.     Red Yeast Rice Extract 600 MG CAPS Take 1 capsule (600 mg total) by mouth daily.     Turmeric 500 MG CAPS Take by mouth.     Ubiquinol 100 MG CAPS Take 100 mg by mouth daily.     Zinc 50 MG CAPS Take by mouth.     HYDROcodone-acetaminophen (NORCO/VICODIN) 5-325 MG tablet Take 1 tablet by mouth every 6 (six) hours as needed for up to 5 doses for severe pain. (Patient not taking: Reported on 07/18/2021) 5 tablet 0   meloxicam (MOBIC) 15 MG tablet TAKE 1 TABLET BY MOUTH ONCE DAILY WITH FOOD (DO  NOT  TAKE  WITH  ALEVE  OR  IBUPROFEN) (Patient not taking: Reported on 07/18/2021) 90 tablet 0   benzonatate (TESSALON) 200 MG capsule Take 1 capsule (200 mg total) by mouth 3 (three) times daily as needed for cough. 50 capsule 1   cephALEXin (KEFLEX) 500 MG capsule Take 1 capsule (500 mg total) by mouth 4 (four) times daily. 20 capsule 0   No facility-administered medications prior to visit.    Allergies  Allergen Reactions   Alendronate Sodium Other (See Comments)    heartburn   Codeine Nausea Only   Influenza Vaccines     Local reaction to vaccine in 2014   Keflex  [Cephalexin] Rash    All around her torso area    ROS Review of Systems  Constitutional:  Positive for fever (per patient report of 100.). Negative for appetite change.  Respiratory:  Negative for cough and shortness of breath.   Cardiovascular:  Negative for chest pain.  Gastrointestinal:  Positive for abdominal pain and nausea. Negative for diarrhea and vomiting.  Musculoskeletal:  Positive for back pain. Negative for arthralgias and myalgias.  Neurological:  Positive for weakness. Negative for numbness.     Objective:    Physical Exam Vitals and nursing note reviewed.  Constitutional:      Appearance: Normal appearance.  Cardiovascular:     Rate and Rhythm: Normal rate and regular rhythm.     Heart sounds: Normal heart sounds.  Pulmonary:     Effort: Pulmonary effort is normal.     Breath sounds: Normal breath sounds.  Abdominal:     General: Bowel sounds are normal. There is no distension.     Palpations: There is no mass.     Tenderness: There is abdominal tenderness in the right upper quadrant.     Hernia: No hernia is present.    Musculoskeletal:        General: No signs of injury.     Lumbar back: Spasms present. No bony tenderness. Positive left straight leg  raise test. Negative right straight leg raise test.     Right lower leg: No edema.     Left lower leg: No edema.  Neurological:     Mental Status: She is alert.     Deep Tendon Reflexes:     Reflex Scores:      Patellar reflexes are 2+ on the right side and 2+ on the left side.    Comments: Bilateral lower extremity strength 5/5 Sensatoin intact DP and PT bilateral pulses 2+    BP (!) 184/96    Pulse 86    Temp 98.2 F (36.8 C)    Resp 16    Ht 5\' 6"  (1.676 m)    Wt 146 lb 4 oz (66.3 kg)    SpO2 97%    BMI 23.61 kg/m  Wt Readings from Last 3 Encounters:  07/18/21 146 lb 4 oz (66.3 kg)  07/15/21 163 lb 2.3 oz (74 kg)  07/14/21 149 lb (67.6 kg)     Health Maintenance Due  Topic Date Due   Zoster  Vaccines- Shingrix (1 of 2) Never done   COVID-19 Vaccine (4 - Booster for Pfizer series) 05/22/2020   MAMMOGRAM  09/13/2020    There are no preventive care reminders to display for this patient.  Lab Results  Component Value Date   TSH 1.25 06/17/2010   Lab Results  Component Value Date   WBC 11.7 (H) 07/16/2021   HGB 14.8 07/16/2021   HCT 45.7 07/16/2021   MCV 90.0 07/16/2021   PLT 243 07/16/2021   Lab Results  Component Value Date   NA 135 07/15/2021   K 3.8 07/15/2021   CO2 24 07/15/2021   GLUCOSE 172 (H) 07/15/2021   BUN 12 07/15/2021   CREATININE 0.78 07/15/2021   BILITOT 0.8 07/15/2021   ALKPHOS 69 07/15/2021   AST 20 07/15/2021   ALT 13 07/15/2021   PROT 7.1 07/15/2021   ALBUMIN 3.9 07/15/2021   CALCIUM 9.2 07/15/2021   ANIONGAP 7 07/15/2021   GFR 85.66 08/19/2020   Lab Results  Component Value Date   CHOL 257 (H) 08/19/2020   Lab Results  Component Value Date   HDL 59.10 08/19/2020   Lab Results  Component Value Date   LDLCALC 174 (H) 08/19/2020   Lab Results  Component Value Date   TRIG 120.0 08/19/2020   Lab Results  Component Value Date   CHOLHDL 4 08/19/2020   No results found for: HGBA1C    Assessment & Plan:   Problem List Items Addressed This Visit       Nervous and Auditory   Bilateral sciatica - Primary    Patient here for emergency department follow-up.  Did review latest CT scan of abdomen pelvis with patient verbally in office.  Exam is consistent with sciatica.  CT did note some degenerative changes on lumbar spine did not feel like we need to repeat imaging today.  Additionally no x-ray in office at time of office visit.  We will start patient on a prednisone taper 40 mg for 3 days 20 mg for 3 days then stop.  Did review prednisone precautions inclusive of use of her meloxicam and other NSAID medications.  Patient to continue using hydrocodone as prescribed along with prednisone.  Continue to monitor if no improvement  follow-up.  Also discussed signs and symptoms when she needs to be seen emergently      Relevant Medications   predniSONE (DELTASONE) 20 MG tablet    Meds  ordered this encounter  Medications   predniSONE (DELTASONE) 20 MG tablet    Sig: Take 1 tablet (20 mg total) by mouth 2 (two) times daily with a meal for 3 days, THEN 1 tablet (20 mg total) daily with breakfast for 3 days. Avoid NSAID use while on medication.    Dispense:  9 tablet    Refill:  0    Order Specific Question:   Supervising Provider    Answer:   Loura Pardon A [[id number]]    Follow-up: No follow-ups on file.   This visit occurred during the SARS-CoV-2 public health emergency.  Safety protocols were in place, including screening questions prior to the visit, additional usage of staff PPE, and extensive cleaning of exam room while observing appropriate contact time as indicated for disinfecting solutions.   Romilda Garret, NP

## 2021-07-18 NOTE — Patient Instructions (Signed)
Nice to see you today I will send in some prednisone for the back. You can take this with the hydrocodone that the ED prescribed you Follow up with Dr. Damita Dunnings if no improvement in symptoms or your symptoms worsen

## 2021-07-18 NOTE — Assessment & Plan Note (Signed)
Patient here for emergency department follow-up.  Did review latest CT scan of abdomen pelvis with patient verbally in office.  Exam is consistent with sciatica.  CT did note some degenerative changes on lumbar spine did not feel like we need to repeat imaging today.  Additionally no x-ray in office at time of office visit.  We will start patient on a prednisone taper 40 mg for 3 days 20 mg for 3 days then stop.  Did review prednisone precautions inclusive of use of her meloxicam and other NSAID medications.  Patient to continue using hydrocodone as prescribed along with prednisone.  Continue to monitor if no improvement follow-up.  Also discussed signs and symptoms when she needs to be seen emergently

## 2021-07-18 NOTE — Telephone Encounter (Signed)
Patient will be seeing Romilda Garret, NP today at [facility]

## 2021-07-21 ENCOUNTER — Ambulatory Visit: Payer: Medicare HMO | Admitting: Family Medicine

## 2021-07-21 ENCOUNTER — Encounter: Payer: Self-pay | Admitting: Nurse Practitioner

## 2021-07-21 MED ORDER — GABAPENTIN 100 MG PO CAPS: 100.0000 mg | ORAL_CAPSULE | Freq: Every day | ORAL | 0 refills | Status: DC

## 2021-07-25 ENCOUNTER — Emergency Department (HOSPITAL_COMMUNITY): Payer: Medicare HMO

## 2021-07-25 ENCOUNTER — Emergency Department (HOSPITAL_COMMUNITY)
Admission: EM | Admit: 2021-07-25 | Discharge: 2021-07-25 | Disposition: A | Discharge status: Home / Self Care | Payer: Medicare HMO | Attending: Student | Admitting: Student

## 2021-07-25 ENCOUNTER — Other Ambulatory Visit: Payer: Self-pay

## 2021-07-25 DIAGNOSIS — M48061 Spinal stenosis, lumbar region without neurogenic claudication: Secondary | ICD-10-CM | POA: Diagnosis not present

## 2021-07-25 DIAGNOSIS — Z85828 Personal history of other malignant neoplasm of skin: Secondary | ICD-10-CM | POA: Insufficient documentation

## 2021-07-25 DIAGNOSIS — M25552 Pain in left hip: Secondary | ICD-10-CM | POA: Diagnosis not present

## 2021-07-25 DIAGNOSIS — M79605 Pain in left leg: Secondary | ICD-10-CM | POA: Insufficient documentation

## 2021-07-25 DIAGNOSIS — M79604 Pain in right leg: Secondary | ICD-10-CM | POA: Diagnosis not present

## 2021-07-25 DIAGNOSIS — M4807 Spinal stenosis, lumbosacral region: Secondary | ICD-10-CM | POA: Diagnosis not present

## 2021-07-25 DIAGNOSIS — M25551 Pain in right hip: Secondary | ICD-10-CM | POA: Diagnosis not present

## 2021-07-25 DIAGNOSIS — M5136 Other intervertebral disc degeneration, lumbar region: Secondary | ICD-10-CM | POA: Diagnosis not present

## 2021-07-25 DIAGNOSIS — Z87891 Personal history of nicotine dependence: Secondary | ICD-10-CM | POA: Diagnosis not present

## 2021-07-25 IMAGING — DX DG HIP (WITH OR WITHOUT PELVIS) 2-3V*L*
3 series · 3 of 3 positions shown · non-contrast
Comparison: None.

CLINICAL DATA: Left hip pain

EXAM:
DG HIP (WITH OR WITHOUT PELVIS) 2-3V LEFT

[pelvis ap]
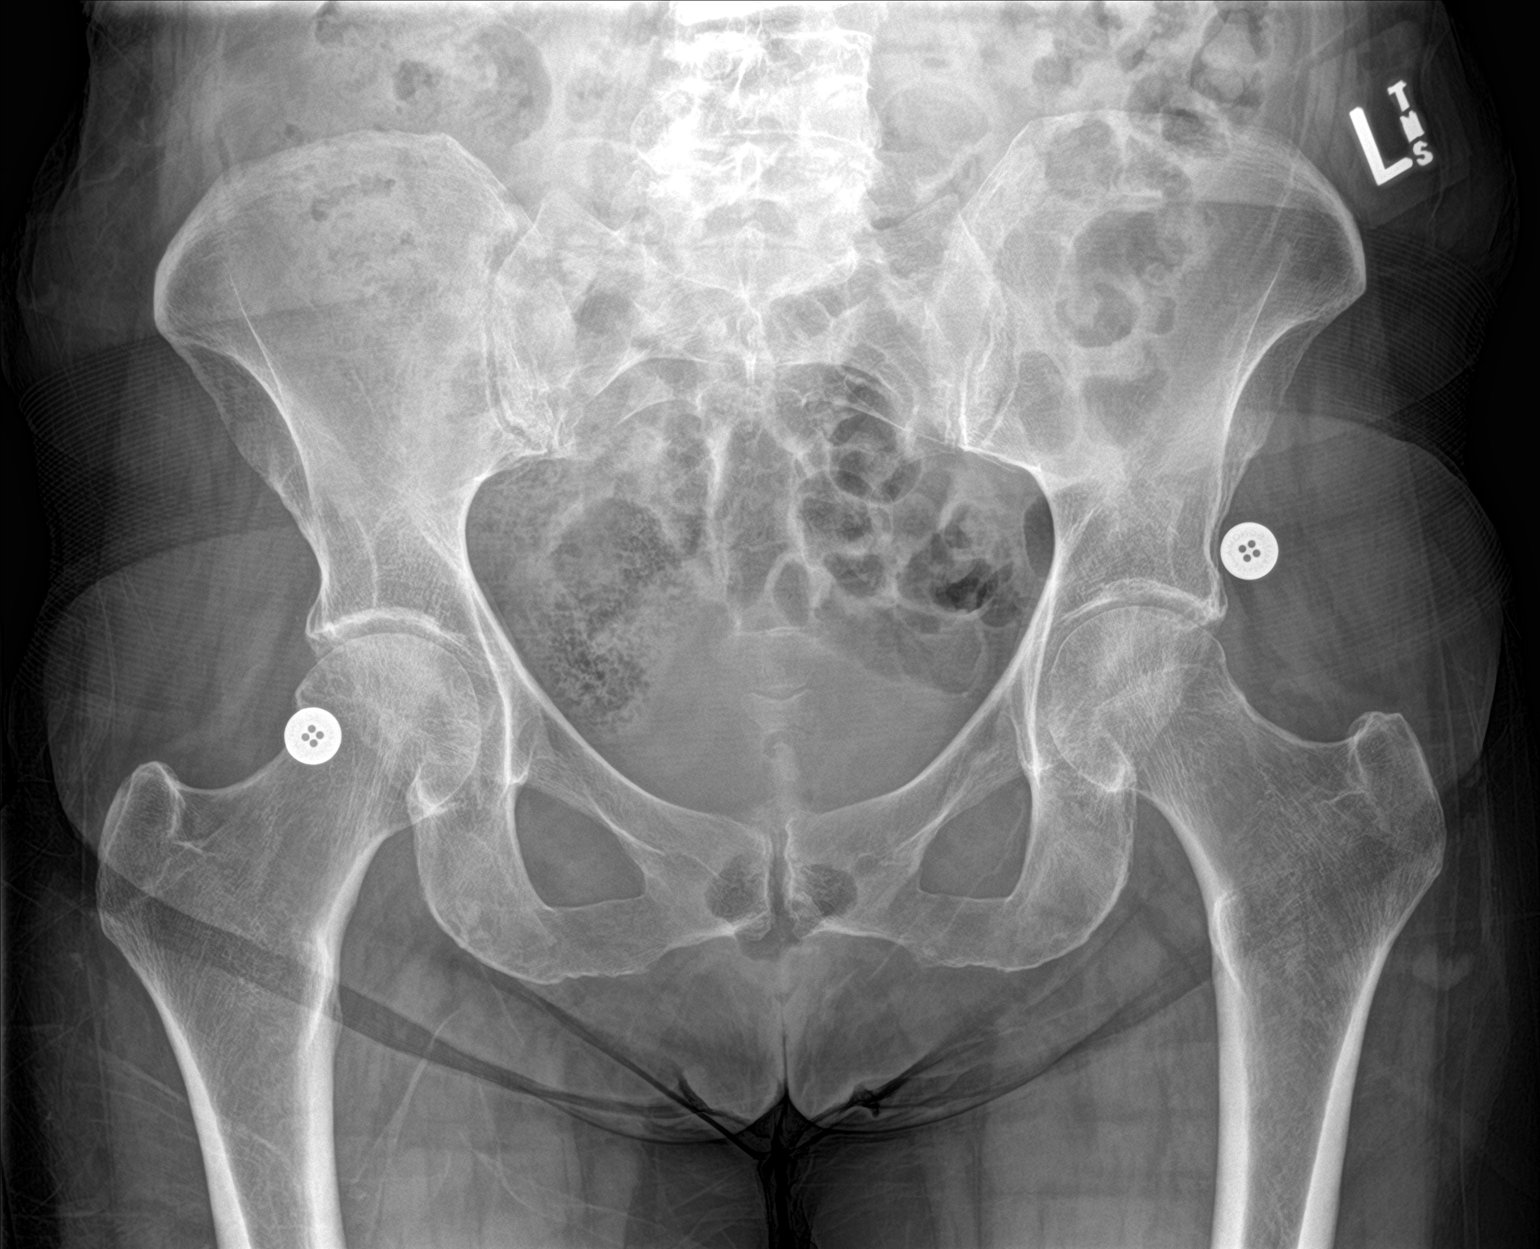

[hip ap]
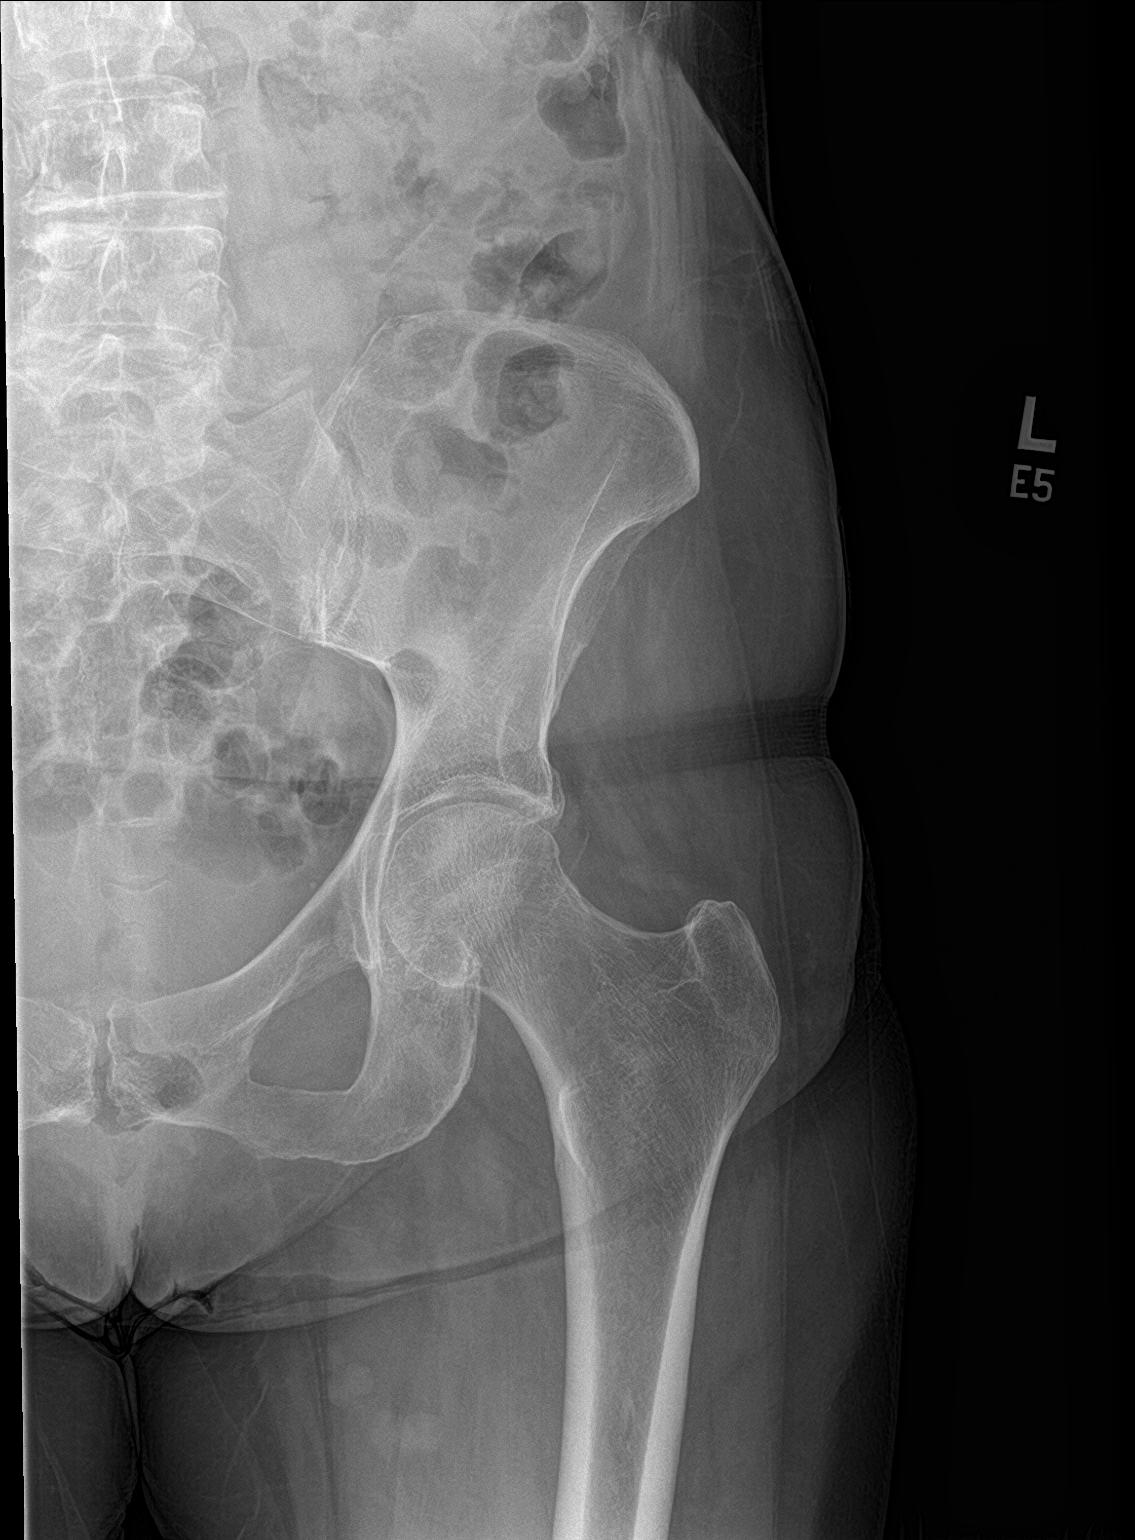

[hip lat]
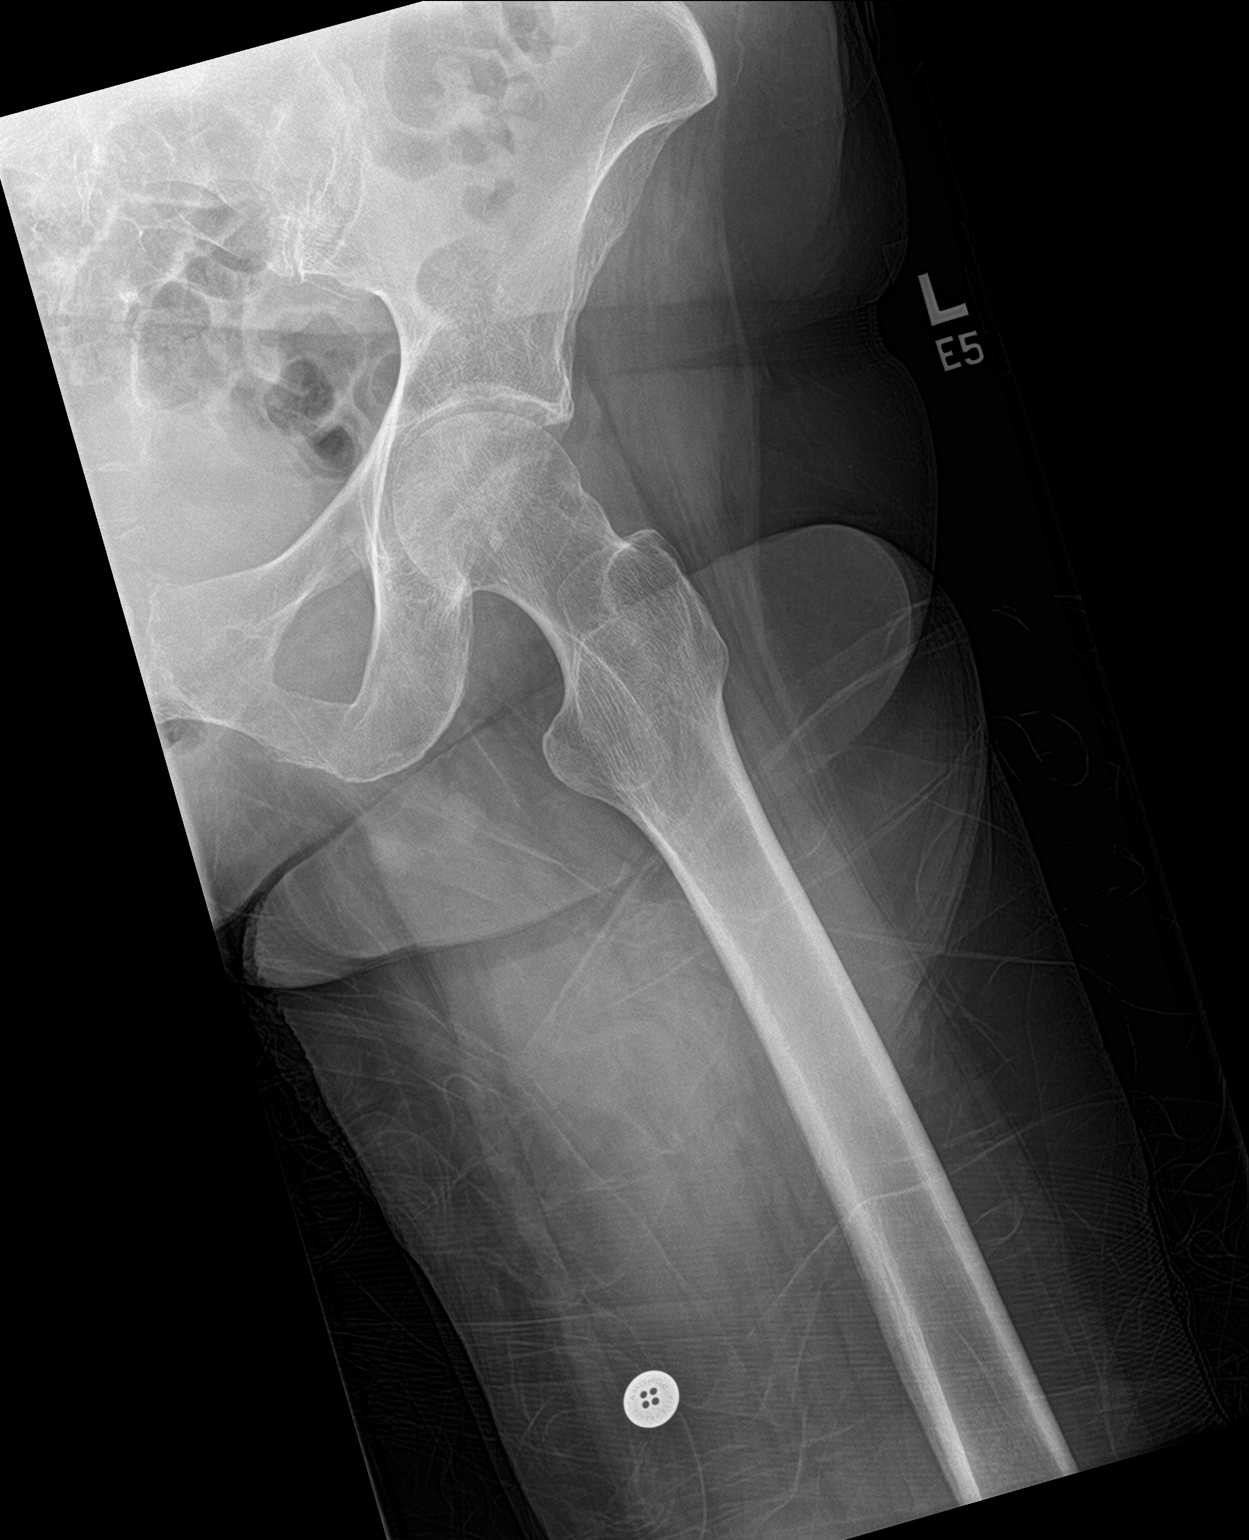

[3 of 3 positions shown; findings below may reference images not displayed]

FINDINGS: No acute fracture or dislocation identified. Mild narrowing of the
hip joint space with mild acetabular subchondral sclerosis. Soft
tissues are unremarkable.
IMPRESSION: No acute osseous abnormality identified.

## 2021-07-25 IMAGING — CT CT L SPINE W/O CM
3 of 4 series · 10 of 33 positions shown, 12 images · non-contrast
Comparison: None.

CLINICAL DATA: Concern for sacral fracture



[Series 3: l-spine 2.0 st · axial · 0.31mm/px · z∈[-892,-726]mm · 4 of 121 slices shown, 5 images]
[im 19/121  soft-tissue]
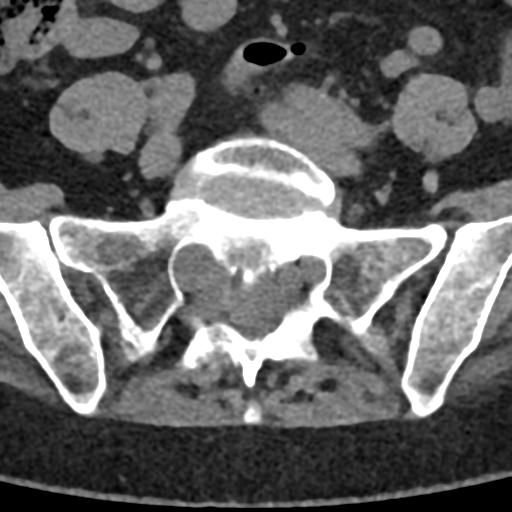
[im 19/121  bone]
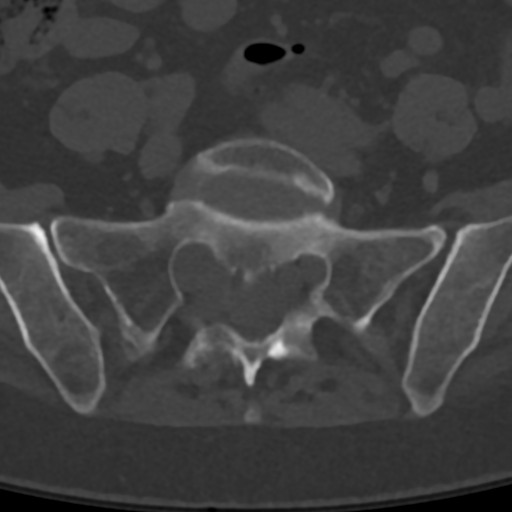
[im 47/121  bone]
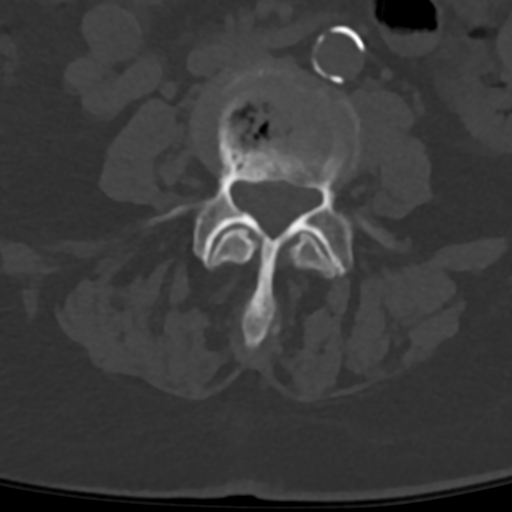
[im 74/121  bone]
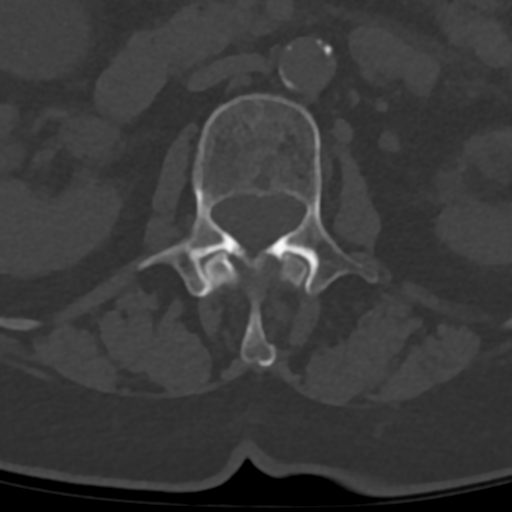
[im 102/121  bone]
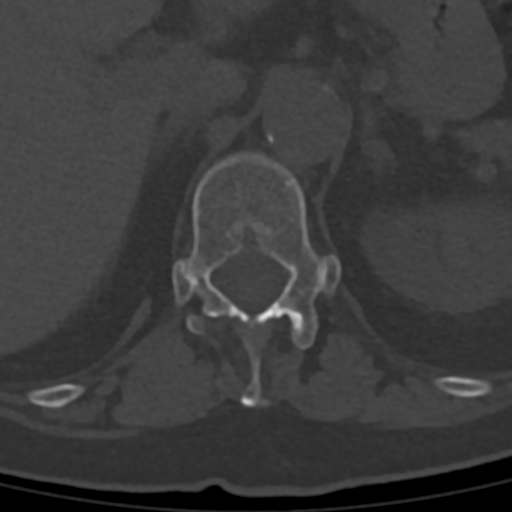

[Series 5: l-spine 2.0 cor bone · coronal · 0.28mm/px · 1 of 81 slices shown]
[im 41/81  bone]
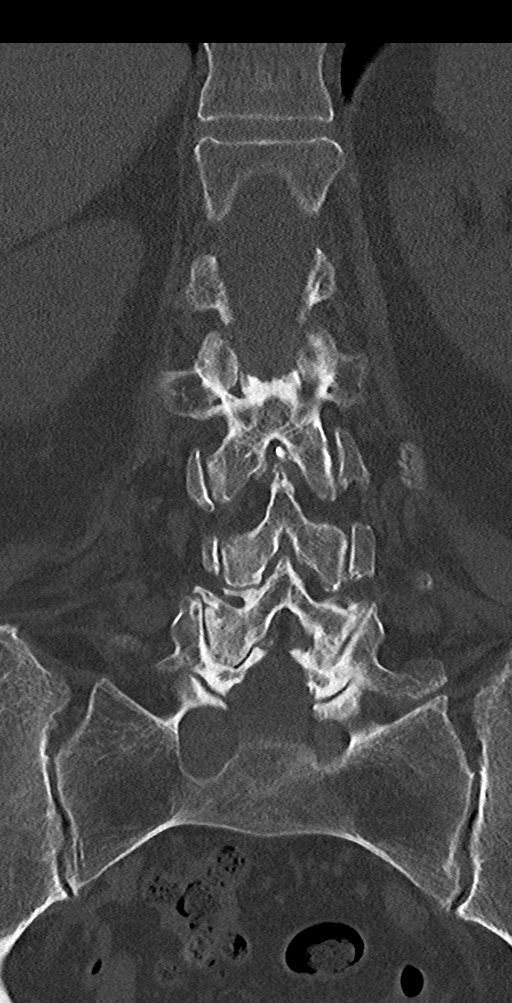

[Series 8: l-spine 2.0 sag · sagittal · 0.30mm/px · 5 of 77 slices shown, 6 images]
[im 26/77  bone]
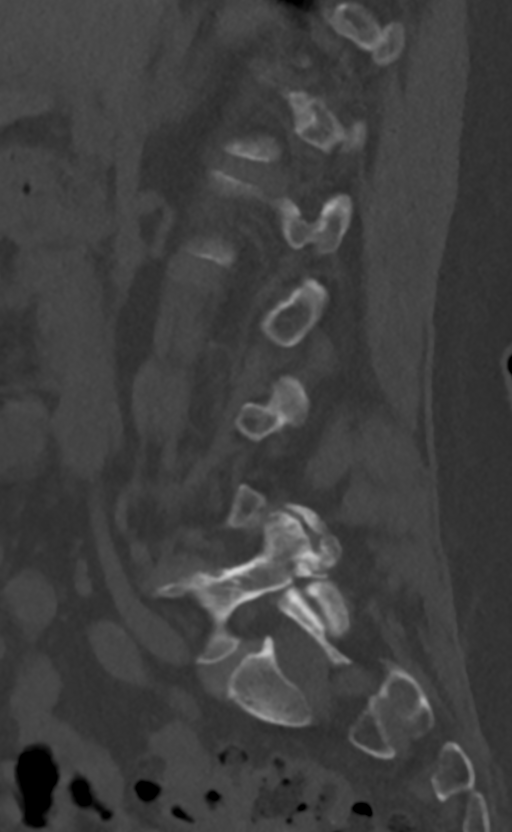
[im 32/77  bone]
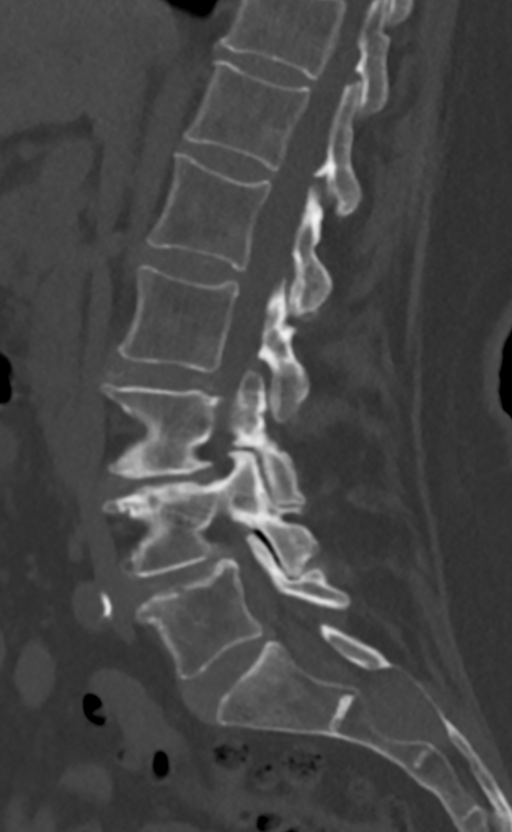
[im 39/77  soft-tissue]
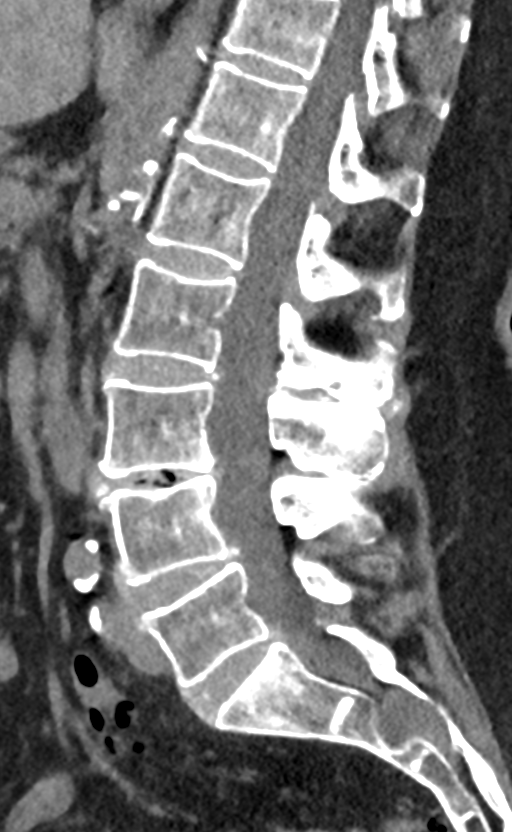
[im 39/77  bone]
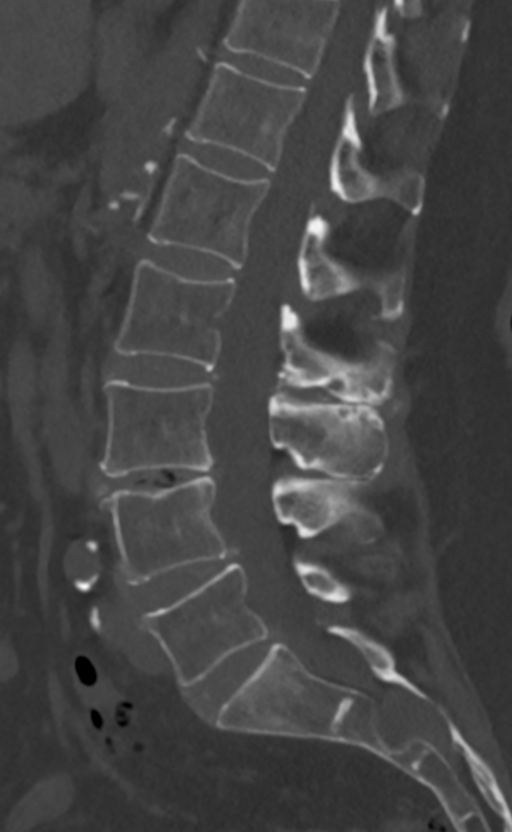
[im 45/77  bone]
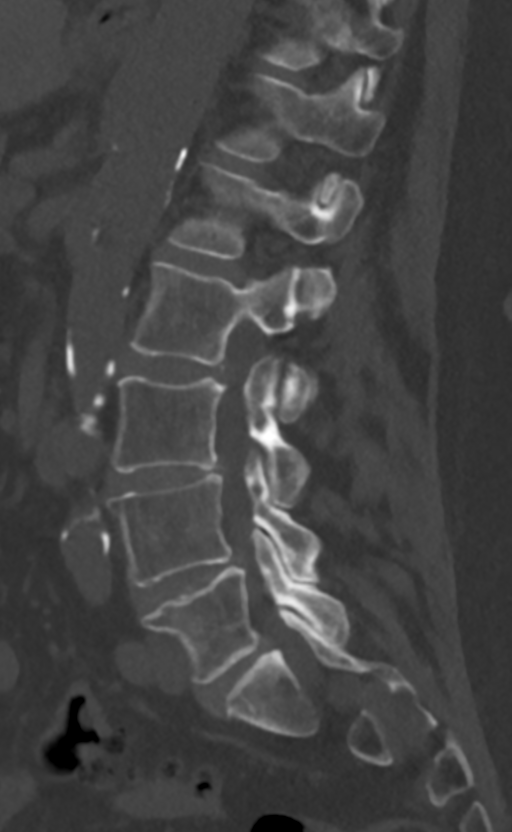
[im 51/77  bone]
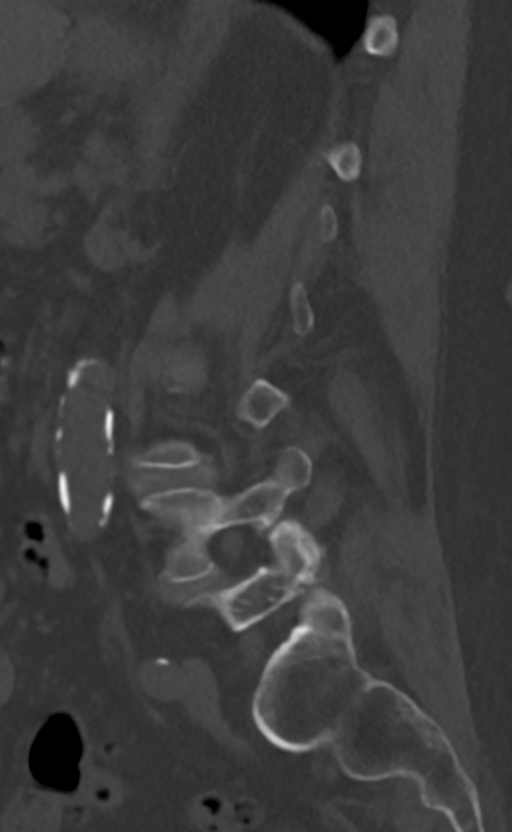

[10 of 33 positions shown; findings below may reference images not displayed]

FINDINGS: Segmentation: 5 lumbar type vertebrae.

Alignment: Trace anterolisthesis at L4-L5.

Vertebrae: There is no acute lumbar spine fracture. No aggressive
osseous lesion. Moderate bilateral SI joint osteoarthritis.

Paraspinal and other soft tissues: Sigmoid diverticulosis.
Atherosclerotic calcifications of the aorta.

Disc levels:

T11-T12: No significant spinal canal or neural foraminal narrowing.

T12-L1: No significant spinal canal or neural foraminal narrowing.

L1-L2: Shallow left foraminal disc protrusion results in mild left
neural foraminal narrowing. No spinal canal stenosis or right neural
foraminal narrowing.

L2-L3: Broad-based disc bulging, ligamentum flavum hypertrophy
bilateral facet arthropathy. No significant spinal canal stenosis.
No significant neural foraminal narrowing. There is severe
interspinous process degeneration.

L3-L4: Asymmetric right disc height loss with left-sided disc bulge
and right-sided endplate spurring, ligamentum flavum hypertrophy and
severe right-sided facet arthropathy. Mild spinal canal stenosis and
mild-to-moderate right neural foraminal stenosis. No left neural
foraminal stenosis. There is moderate interspinous process
degeneration.

L4-L5: Trace anterolisthesis with broad-based disc bulging,
ligamentum flavum hypertrophy and bilateral facet arthropathy
results in mild spinal canal stenosis and mild right neural
foraminal stenosis. No left neural foraminal stenosis.

L5-S1: Asymmetric left disc height loss with mild disc bulging and
bilateral facet arthropathy. No spinal canal stenosis. Mild left
neural foraminal narrowing. No right neural foraminal narrowing.

There are perineural/Tarlov cysts on the right at S1 and bilaterally
at S2 with smooth expansion of the neural foramen.
IMPRESSION: No acute lumbar spine fracture or evidence of sacral fracture.

Multilevel degenerative changes of the lumbar spine, with mild
spinal canal stenosis at L3-L4 and L4-L5. Mild-to-moderate
right-sided neural foraminal stenosis at L3-L4. Mild right neural
foraminal stenosis at L4-L5. Mild left neural foraminal stenosis at
L5-S1.

Severe interspinous process degeneration at L2-L3 and moderate at
L3-L4.

Perineural/Tarlov cysts on the right at S1 and bilaterally at S2
with smooth expansion of the neural foramen.

Moderate bilateral sacroiliac joint osteoarthritis.

## 2021-07-25 IMAGING — DX DG HIP (WITH OR WITHOUT PELVIS) 2-3V*R*
2 series · 2 of 2 positions shown · non-contrast
Comparison: None.

CLINICAL DATA: Right hip pain.

EXAM:
DG HIP (WITH OR WITHOUT PELVIS) 2-3V RIGHT

[hip ap]
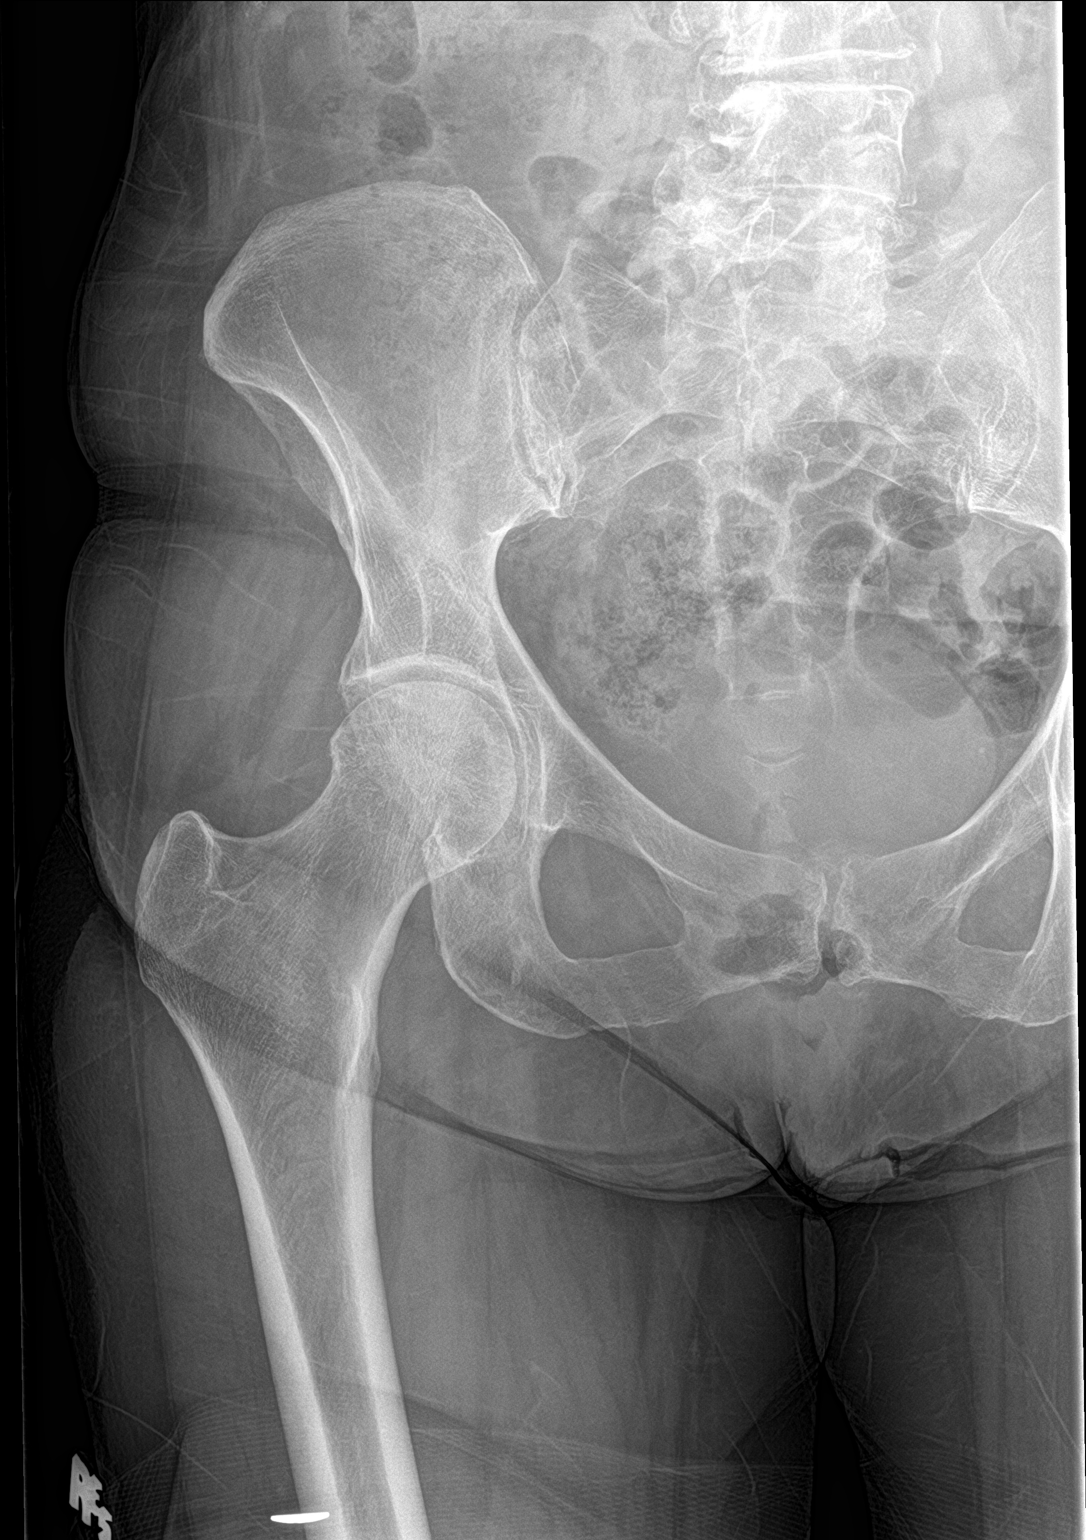

[hip lat]
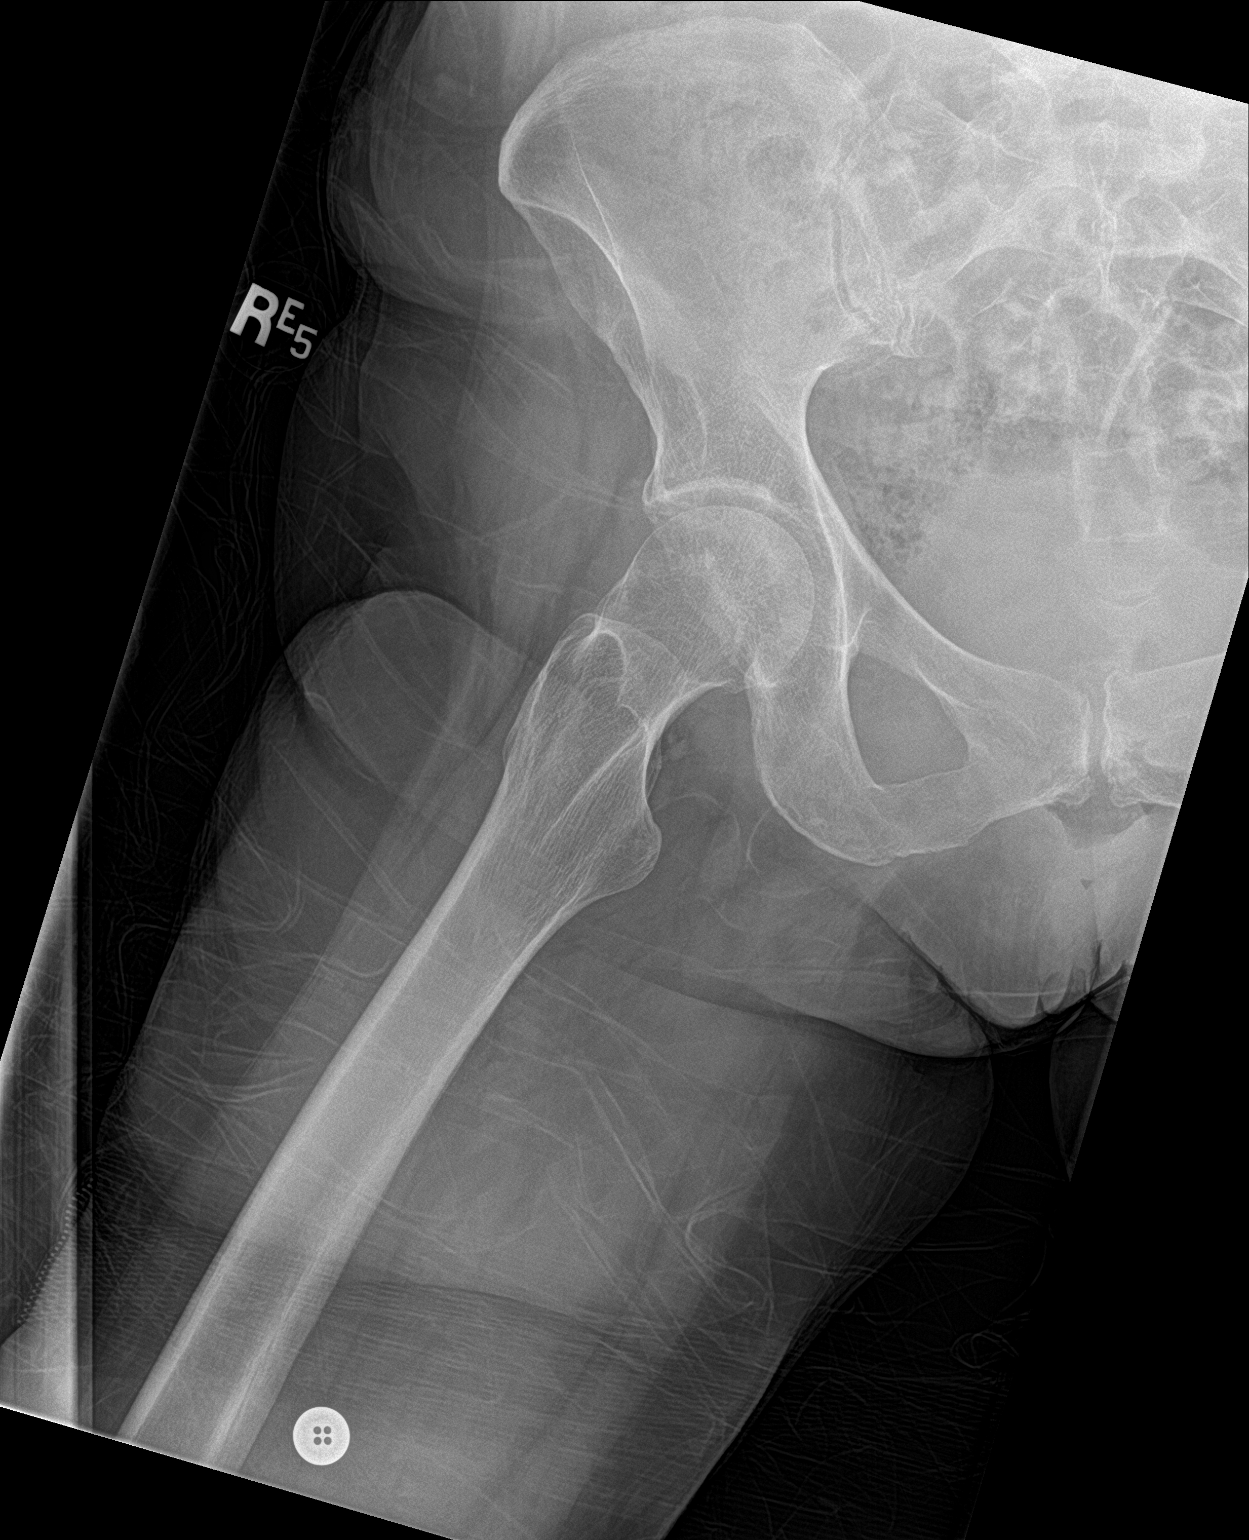

[2 of 2 positions shown; findings below may reference images not displayed]

FINDINGS: There is no evidence of hip fracture or dislocation. There is no
evidence of arthropathy or other focal bone abnormality.
IMPRESSION: No acute osseous abnormality identified.

## 2021-07-25 MED ORDER — HYDROCODONE-ACETAMINOPHEN 5-325 MG PO TABS
1.0000 | ORAL_TABLET | Freq: Once | ORAL | Status: AC
  Administered 2021-07-25: 1 via ORAL
  Filled 2021-07-25: qty 1

## 2021-07-25 MED ORDER — KETOROLAC TROMETHAMINE 15 MG/ML IJ SOLN
15.0000 mg | Freq: Once | INTRAMUSCULAR | Status: AC
Start: 2021-07-25 — End: 2021-07-25
  Administered 2021-07-25: 15 mg via INTRAMUSCULAR
  Filled 2021-07-25: qty 1

## 2021-07-25 MED ORDER — NAPROXEN 375 MG PO TABS: 375.0000 mg | ORAL_TABLET | Freq: Two times a day (BID) | ORAL | 0 refills | Status: DC

## 2021-07-25 NOTE — Telephone Encounter (Signed)
Please triage patient due to new symptoms as mentioned via mychart. Thank you

## 2021-07-25 NOTE — ED Triage Notes (Signed)
Pt here POV d/t continued bilateral leg pain. More severe on right side. Pt was told to come to ED for further testing. 9/10 pain

## 2021-07-25 NOTE — ED Provider Notes (Signed)
[facility] EMERGENCY DEPARTMENT Provider Note  CSN: [id number] Arrival date & time: 07/25/21 0932  Chief Complaint(s) Leg Pain  HPI Desiree Ortega is a 76 y.o. female who presents the emergency department for evaluation of bilateral leg pain.  Patient has had 2 presentations previously on 07/14/2021 and 07/15/2021 for abdominal pain, back pain and lower extremity pain.  He has had 2 CT abdomen pelvis is with no acute findings and has since followed up with a primary care physician who put the patient on a steroid Medrol Dosepak.  Patient returns the emergency department due to uncontrolled pain and persistent leg symptoms.  The abdominal pain is since resolved.  Pain initiates at the hips and radiates down to the popliteal fossa but does not radiate past down to the ankles.  Denies falls or trauma.  Denies chest pain, shortness of breath, Donnell pain, nausea, vomiting, numbness, tingling, weakness or other systemic symptoms.  No saddle anesthesia, urinary or fecal incontinence.   Leg Pain  Past Medical History Past Medical History:  Diagnosis Date   Allergy    Cancer ([place]) on back,leg,tailbone   prev squam and basal cell skin CA removed- Dr. Evorn Gong   Diverticulosis    Moderate   Female bladder prolapse    H/O cold sores    History of colon polyps    History of kidney stones    Hyperlipidemia    Migraine with aura    Osteopenia    prev on fosamax for a few years then started evista at age ~30, T score improved  from -2.29/-1.96 to -1.8/-1.7 as of 06/2011, repeat DXA done 2015   Patient Active Problem List   Diagnosis Date Noted   Bilateral sciatica 07/18/2021   Lower back pain 08/23/2019   Stye 09/11/2018   Health care maintenance 08/09/2017   Cold sore 07/15/2015   Advance care planning 07/12/2014   Migraine aura without headache 03/28/2014   Medicare annual wellness visit, subsequent 07/03/2012   Osteopenia 06/26/2011   HLD (hyperlipidemia) 04/27/2007    ALLERGY, ENVIRONMENTAL 04/27/2007   Home Medication(s) Prior to Admission medications   Medication Sig Start Date End Date Taking? Authorizing Provider  acetaminophen (TYLENOL) 500 MG tablet Take 500 mg by mouth 2 (two) times daily as needed (for pain.).    [provider]  Ascorbic Acid (VITAMIN C) 1000 MG tablet Take 1 tablet (1,000 mg total) by mouth daily. 08/21/18   Tonia Ghent, MD  b complex vitamins tablet Take 1 tablet by mouth daily.    [provider]  Cholecalciferol (VITAMIN D PO) Take by mouth.    [provider]  CINNAMON PO Take 1,000 mg by mouth daily.    [provider]  Cyanocobalamin (B-12 PO) Take by mouth.    [provider]  fexofenadine (ALLEGRA) 180 MG tablet Take 1 tablet (180 mg total) by mouth as needed. Seasonal allergies from Spring to Fall 08/21/18   Tonia Ghent, MD  Flaxseed, Linseed, (FLAX SEEDS PO) Take by mouth.    [provider]  gabapentin (NEURONTIN) 100 MG capsule Take 1 capsule (100 mg total) by mouth at bedtime. DO NOT take with the hydrocodone 07/21/21   Michela Pitcher, NP  HYDROcodone-acetaminophen (NORCO/VICODIN) 5-325 MG tablet Take 1 tablet by mouth every 6 (six) hours as needed for up to 5 doses for severe pain. Patient not taking: Reported on 07/18/2021 07/16/21   Evlyn Courier, PA-C  Magnesium 250 MG TABS Take 250 mg  by mouth daily.    [provider]  Melatonin 3 MG TABS Take 1 tablet (3 mg total) by mouth at bedtime as needed. 08/21/18   Tonia Ghent, MD  meloxicam (MOBIC) 15 MG tablet TAKE 1 TABLET BY MOUTH ONCE DAILY WITH FOOD (DO  NOT  TAKE  WITH  ALEVE  OR  IBUPROFEN) Patient not taking: Reported on 07/18/2021 01/01/21   Tonia Ghent, MD  Multiple Vitamin (MULTIVITAMIN) capsule Take 1 capsule by mouth daily. 08/21/18   Tonia Ghent, MD  Omega-3 Fatty Acids (FISH OIL PO) Take by mouth.    [provider]  Red Yeast Rice Extract 600 MG CAPS Take 1 capsule (600 mg  total) by mouth daily. 08/23/20   Tonia Ghent, MD  Turmeric 500 MG CAPS Take by mouth.    [provider]  Ubiquinol 100 MG CAPS Take 100 mg by mouth daily. 08/21/18   Tonia Ghent, MD  Zinc 50 MG CAPS Take by mouth.    [provider]                                                                                                                                    Past Surgical History Past Surgical History:  Procedure Laterality Date   BREAST BIOPSY Left    benign   CATARACT EXTRACTION W/ INTRAOCULAR LENS IMPLANT Left 11/2016   CATARACT EXTRACTION W/ INTRAOCULAR LENS IMPLANT Right 12/22/2016   COLONOSCOPY  08/15/2013   cyst on back     2 times   cyst on eyelid     right eye   CYSTOCELE REPAIR N/A 02/01/2018   Procedure: ANTERIOR REPAIR (CYSTOCELE);  Surgeon: Bjorn Loser, MD;  Location: [facility];  Service: Urology;  Laterality: N/A;   CYSTOSCOPY N/A 02/01/2018   Procedure: cystoscopy;  Surgeon: Bjorn Loser, MD;  Location: [facility];  Service: Urology;  Laterality: N/A;   LAPAROSCOPIC VAGINAL HYSTERECTOMY WITH SALPINGO OOPHORECTOMY Bilateral 02/01/2018   Procedure: LAPAROSCOPIC ASSISTED VAGINAL HYSTERECTOMY WITH BILATERAL SALPINGO OOPHORECTOMY;  Surgeon: Servando Salina, MD;  Location: [facility];  Service: Gynecology;  Laterality: Bilateral;   SQUAMOUS CELL CARCINOMA EXCISION     on back/removed 2 times/and thigh   SQUAMOUS CELL CARCINOMA EXCISION Left 07/15/2016   left lower calf   SSC removed  1988   Birthmark removal partial 1/3   TONSILLECTOMY  1950   TUBAL LIGATION  1980's   Family History Family History  Problem Relation Age of Onset   Osteopenia Mother    Hypertension Father    Heart disease Father        AFIB, PVD stents LE's, pacer   Stroke Father    Cancer Brother        prostate, prostatectomy, radiation 5 years later, Hormone Tx   Prostate cancer Brother    Heart disease Brother    Cancer Brother  prostate, prostatectomy    Prostate cancer Brother    Diabetes Other    Cancer Other        Non-Hodgkin's Lymphoma   Depression Daughter    Alcohol abuse Neg Hx    Drug abuse Neg Hx    Colon cancer Neg Hx    Breast cancer Neg Hx    Bladder Cancer Neg Hx    Kidney cancer Neg Hx     Social History Social History   Tobacco Use   Smoking status: Former    Packs/day: 0.50    Years: 36.00    Pack years: 18.00    Types: Cigarettes    Quit date: 06/22/2001    Years since quitting: 20.1   Smokeless tobacco: Never  Vaping Use   Vaping Use: Never used  Substance Use Topics   Alcohol use: Yes    Alcohol/week: 2.0 standard drinks    Types: 2 Glasses of wine per week    Comment: occasionally   Drug use: No   Allergies Alendronate sodium, Codeine, Influenza vaccines, and Keflex [cephalexin]  Review of Systems Review of Systems  Musculoskeletal:  Positive for arthralgias.   Physical Exam Vital Signs  I have reviewed the triage vital signs BP 140/75 (BP Location: Left Arm)    Pulse 85    Temp 97.7 F (36.5 C) (Oral)    Resp 16    Ht 5\' 6"  (1.676 m)    Wt 66 kg    SpO2 97%    BMI 23.48 kg/m   Physical Exam Vitals and nursing note reviewed.  Constitutional:      General: She is not in acute distress.    Appearance: She is well-developed.  HENT:     Head: Normocephalic and atraumatic.  Eyes:     Conjunctiva/sclera: Conjunctivae normal.  Cardiovascular:     Rate and Rhythm: Normal rate and regular rhythm.     Heart sounds: No murmur heard. Pulmonary:     Effort: Pulmonary effort is normal. No respiratory distress.     Breath sounds: Normal breath sounds.  Abdominal:     Palpations: Abdomen is soft.     Tenderness: There is no abdominal tenderness.  Musculoskeletal:        General: Tenderness (Bilateral hips) present. No swelling.     Cervical back: Neck supple.  Skin:    General: Skin is warm and dry.     Capillary Refill: Capillary refill takes less than 2 seconds.  Neurological:     Mental  Status: She is alert.  Psychiatric:        Mood and Affect: Mood normal.    ED Results and Treatments Labs (all labs ordered are listed, but only abnormal results are displayed) Labs Reviewed - No data to display                                                                                                                        Radiology No results found.  Pertinent labs & imaging results that were available during my care of the patient were reviewed by me and considered in my medical decision making (see MDM for details).  Medications Ordered in ED Medications  HYDROcodone-acetaminophen (NORCO/VICODIN) 5-325 MG per tablet 1 tablet (has no administration in time range)  ketorolac (TORADOL) 15 MG/ML injection 15 mg (has no administration in time range)                                                                                                                                     Procedures Procedures  (including critical care time)  Medical Decision Making / ED Course   This patient presents to the ED for concern of leg pain, this involves an extensive number of treatment options, and is a complaint that carries with it a high risk of complications and morbidity.  The differential diagnosis includes sciatica, osteoarthritis, sacral fracture, musculoskeletal pain  MDM: Patient seen emergency room for evaluation of bilateral leg pain.  Physical exam reveals tenderness at bilateral hips but no tenderness along the back.  Straight leg is negative.  X-rays of bilateral hips are unremarkable.  CT of the L-spine shows no acute fracture but does show multilevel degenerative changes from L3-L5, mild right-sided foraminal stenosis at L3-L4 as well as L4-L5, mild left foraminal stenosis at L5-S1, severe interspinous process degeneration at L2-L3 and moderate at L3-L4 as well as moderate bilateral sacroiliac joint osteoarthritis.  The CT is demonstrating chronic degenerative changes and  patient was given single dose Norco and 15 mg of intramuscular Toradol and her pain completely resolved.  Patient does not have strong contraindications to NSAID therapy and we will trial a 5-day course of Naprosyn to bridge the patient to her primary care physician.  Patient will reach out to her primary care physician to find out if she is allowed to continue taking this medication long-term as she appears to had significant provement with NSAID therapy.  Patient previously on Mobic, and perhaps she requires both Cox 1 and Cox 2 inhibition.  Patient then discharged with PCP follow-up.   Additional history obtained: -Additional history obtained from husband -External records from outside source obtained and reviewed including: Chart review including previous notes, labs, imaging, consultation notes   Lab Tests: -I ordered, reviewed, and interpreted labs.   The pertinent results include:   Labs Reviewed - No data to display    Imaging Studies ordered: I ordered imaging studies including XR hips, CT L spine I independently visualized and interpreted imaging. I agree with the radiologist interpretation   Medicines ordered and prescription drug management: Meds ordered this encounter  Medications   HYDROcodone-acetaminophen (NORCO/VICODIN) 5-325 MG per tablet 1 tablet   ketorolac (TORADOL) 15 MG/ML injection 15 mg    -I have reviewed the patients home medicines and have made adjustments as needed  Critical interventions none  Social Determinants of Health:  Factors impacting patients  care include: none   Reevaluation: After the interventions noted above, I reevaluated the patient and found that they have :improved  Co morbidities that complicate the patient evaluation  Past Medical History:  Diagnosis Date   Allergy    Cancer ([place]) on back,leg,tailbone   prev squam and basal cell skin CA removed- Dr. Evorn Gong   Diverticulosis    Moderate   Female bladder prolapse    H/O  cold sores    History of colon polyps    History of kidney stones    Hyperlipidemia    Migraine with aura    Osteopenia    prev on fosamax for a few years then started evista at age ~59, T score improved  from -2.29/-1.96 to -1.8/-1.7 as of 06/2011, repeat DXA done 2015      Dispostion: I considered admission for this patient, but due to resolved symptoms and no acute injury, patient safe for outpatient follow-up.     Final Clinical Impression(s) / ED Diagnoses Final diagnoses:  None     @PCDICTATION @    Quintina Hakeem, Debe Coder, MD 07/25/21 1216

## 2021-07-25 NOTE — Telephone Encounter (Signed)
Late entry.  D/w pt's daughter.  She was worried about the patient and the plan for ER eval.  Without giving specific details, discussed that she had been seen in the clinic and given her recent sx (of which her daughter was already aware), ER eval today made sense.  Patient had checked into ER at the time of the call and daughter was going to contact her mother/father at ER.  I will await ER report and I thank all involved.  Daughter stated that she was just really worried about her mother and I stated that I appreciated her concern.  She thanked me for taking the call.

## 2021-07-25 NOTE — Telephone Encounter (Signed)
I spoke with pt; pt said rt thigh area is hurting in back of leg. Pt cannot sit down and pt has pain level of 10 when trying to walk. The leg pain started few days ago but now the pain has worsened and the pain is almost unbearable. Pt said she will try the imaging that Romilda Garret NP was going to order for her and I advised that imaging was of low back and pelvis. Since the pain is in the right leg now can be different causes but since her pain level is so high pt needs to go to ED for eval and testing. Pt voiced understanding and wants to know wait time at [facility] ED. I called [phone] ans spoke with [facility] ED and no wait time now but that can change since walk in basis. Pt given info and pts husband will take her to [facility] ED now. Sending note to Romilda Garret NP, Azalee Course CMA and Dr Damita Dunnings as Juluis Rainier to PCP.

## 2021-07-26 ENCOUNTER — Observation Stay (HOSPITAL_COMMUNITY)
Admission: EM | Admit: 2021-07-26 | Discharge: 2021-07-28 | Disposition: A | Discharge status: Home / Self Care | Payer: Medicare HMO | Attending: Internal Medicine | Admitting: Internal Medicine

## 2021-07-26 ENCOUNTER — Observation Stay (HOSPITAL_COMMUNITY): Payer: Medicare HMO

## 2021-07-26 ENCOUNTER — Other Ambulatory Visit: Payer: Self-pay

## 2021-07-26 ENCOUNTER — Encounter: Payer: Self-pay | Admitting: Family Medicine

## 2021-07-26 ENCOUNTER — Encounter (HOSPITAL_COMMUNITY): Payer: Self-pay | Admitting: Emergency Medicine

## 2021-07-26 DIAGNOSIS — R030 Elevated blood-pressure reading, without diagnosis of hypertension: Secondary | ICD-10-CM | POA: Diagnosis not present

## 2021-07-26 DIAGNOSIS — E785 Hyperlipidemia, unspecified: Secondary | ICD-10-CM | POA: Diagnosis present

## 2021-07-26 DIAGNOSIS — M5126 Other intervertebral disc displacement, lumbar region: Secondary | ICD-10-CM | POA: Diagnosis not present

## 2021-07-26 DIAGNOSIS — M79662 Pain in left lower leg: Secondary | ICD-10-CM | POA: Insufficient documentation

## 2021-07-26 DIAGNOSIS — Z87891 Personal history of nicotine dependence: Secondary | ICD-10-CM | POA: Diagnosis not present

## 2021-07-26 DIAGNOSIS — M5410 Radiculopathy, site unspecified: Secondary | ICD-10-CM | POA: Diagnosis not present

## 2021-07-26 DIAGNOSIS — Z20822 Contact with and (suspected) exposure to covid-19: Secondary | ICD-10-CM | POA: Insufficient documentation

## 2021-07-26 DIAGNOSIS — Z85828 Personal history of other malignant neoplasm of skin: Secondary | ICD-10-CM | POA: Diagnosis not present

## 2021-07-26 DIAGNOSIS — M79661 Pain in right lower leg: Principal | ICD-10-CM | POA: Insufficient documentation

## 2021-07-26 DIAGNOSIS — M47816 Spondylosis without myelopathy or radiculopathy, lumbar region: Secondary | ICD-10-CM | POA: Diagnosis not present

## 2021-07-26 DIAGNOSIS — E875 Hyperkalemia: Secondary | ICD-10-CM | POA: Insufficient documentation

## 2021-07-26 DIAGNOSIS — M79604 Pain in right leg: Secondary | ICD-10-CM | POA: Diagnosis not present

## 2021-07-26 DIAGNOSIS — M79605 Pain in left leg: Secondary | ICD-10-CM | POA: Diagnosis present

## 2021-07-26 DIAGNOSIS — M546 Pain in thoracic spine: Secondary | ICD-10-CM | POA: Diagnosis not present

## 2021-07-26 DIAGNOSIS — D72829 Elevated white blood cell count, unspecified: Secondary | ICD-10-CM | POA: Diagnosis present

## 2021-07-26 DIAGNOSIS — M50223 Other cervical disc displacement at C6-C7 level: Secondary | ICD-10-CM | POA: Diagnosis not present

## 2021-07-26 DIAGNOSIS — N1330 Unspecified hydronephrosis: Secondary | ICD-10-CM | POA: Insufficient documentation

## 2021-07-26 DIAGNOSIS — R339 Retention of urine, unspecified: Secondary | ICD-10-CM | POA: Diagnosis not present

## 2021-07-26 LAB — CBC WITH DIFFERENTIAL/PLATELET
Abs Immature Granulocytes: 0.12 K/uL — ABNORMAL HIGH (ref 0.00–0.07)
Basophils Absolute: 0.1 K/uL (ref 0.0–0.1)
Basophils Relative: 1 %
Eosinophils Absolute: 0.1 K/uL (ref 0.0–0.5)
Eosinophils Relative: 1 %
HCT: 45.9 % (ref 36.0–46.0)
Hemoglobin: 15.4 g/dL — ABNORMAL HIGH (ref 12.0–15.0)
Immature Granulocytes: 1 %
Lymphocytes Relative: 31 %
Lymphs Abs: 4.1 K/uL — ABNORMAL HIGH (ref 0.7–4.0)
MCH: 29.6 pg (ref 26.0–34.0)
MCHC: 33.6 g/dL (ref 30.0–36.0)
MCV: 88.3 fL (ref 80.0–100.0)
Monocytes Absolute: 0.8 K/uL (ref 0.1–1.0)
Monocytes Relative: 6 %
Neutro Abs: 8.1 K/uL — ABNORMAL HIGH (ref 1.7–7.7)
Neutrophils Relative %: 60 %
Platelets: 320 K/uL (ref 150–400)
RBC: 5.2 MIL/uL — ABNORMAL HIGH (ref 3.87–5.11)
RDW: 12.5 % (ref 11.5–15.5)
WBC: 13.3 K/uL — ABNORMAL HIGH (ref 4.0–10.5)
nRBC: 0 % (ref 0.0–0.2)

## 2021-07-26 LAB — BASIC METABOLIC PANEL WITH GFR
Anion gap: 9 (ref 5–15)
BUN: 19 mg/dL (ref 8–23)
CO2: 27 mmol/L (ref 22–32)
Calcium: 9 mg/dL (ref 8.9–10.3)
Chloride: 99 mmol/L (ref 98–111)
Creatinine, Ser: 0.72 mg/dL (ref 0.44–1.00)
GFR, Estimated: >60 mL/min
Glucose, Bld: 104 mg/dL — ABNORMAL HIGH (ref 70–99)
Potassium: 4.1 mmol/L (ref 3.5–5.1)
Sodium: 135 mmol/L (ref 135–145)

## 2021-07-26 LAB — RESP PANEL BY RT-PCR (FLU A&B, COVID) ARPGX2
Influenza A by PCR: NEGATIVE
Influenza B by PCR: NEGATIVE
SARS Coronavirus 2 by RT PCR: NEGATIVE

## 2021-07-26 IMAGING — MR MR LUMBAR SPINE W/O CM
4 of 6 series · 25 of 48 positions shown · non-contrast
Comparison: No prior MRI of the cervical, thoracic, or lumbar
spine. Correlation is made with CT lumbar spine [DATE]

CLINICAL DATA: Neck, mid back, and low back pain, with bilateral
leg and buttock pain

EXAM:
MRI CERVICAL, THORACIC AND LUMBAR SPINE WITHOUT CONTRAST
TECHNIQUE: Multiplanar and multiecho pulse sequences of the cervical spine, to
include the craniocervical junction and cervicothoracic junction,
and thoracic and lumbar spine, were obtained without intravenous
contrast.

[Series 5: T2 · sagittal · 4.0mm · 0.73mm/px · 5 of 17 slices shown (1 of 2)]
[im 1/17]
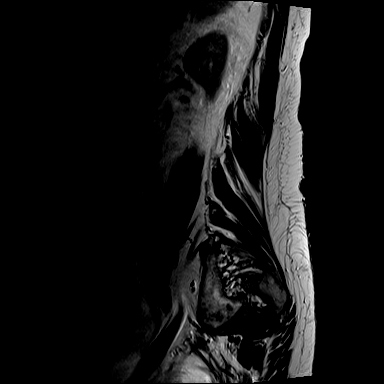
[im 5/17]
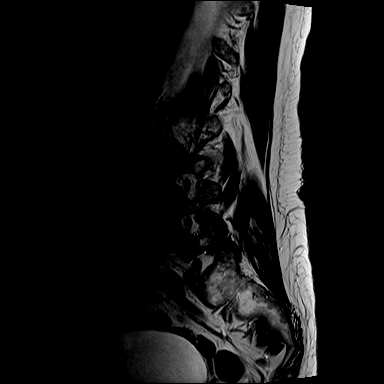
[im 9/17]
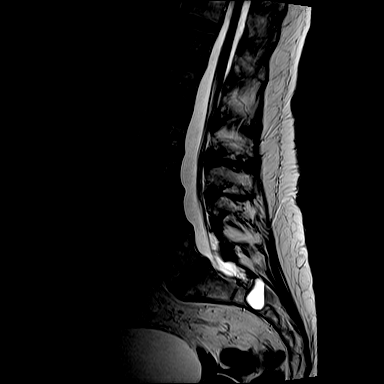
[im 13/17]
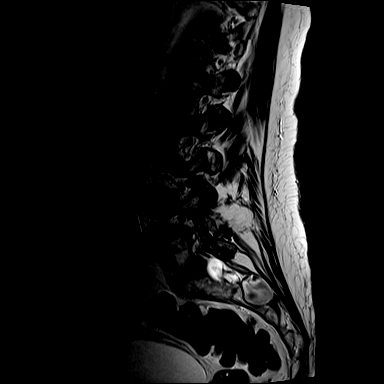
[im 17/17]
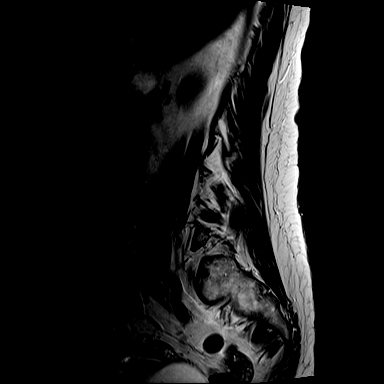

[Series 7: T1 · sagittal · 4.0mm · 0.88mm/px · 5 of 17 slices shown (1 of 2)]
[im 1/17]
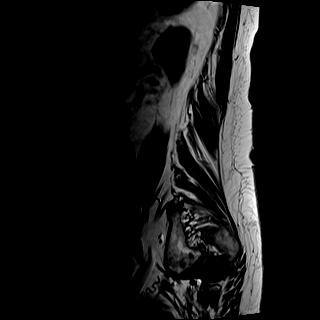
[im 5/17]
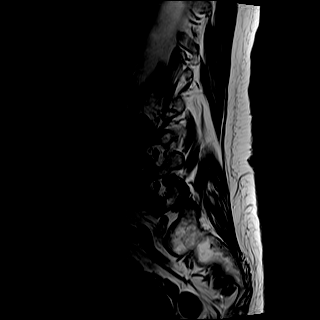
[im 9/17]
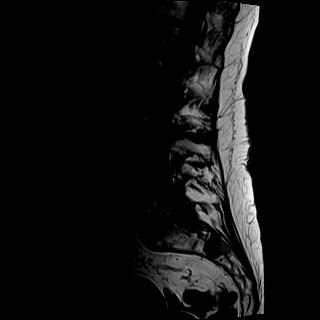
[im 13/17]
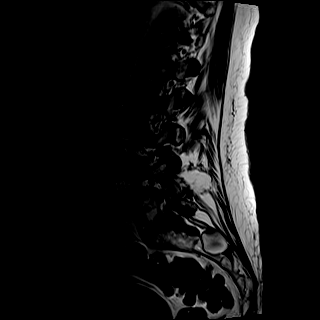
[im 17/17]
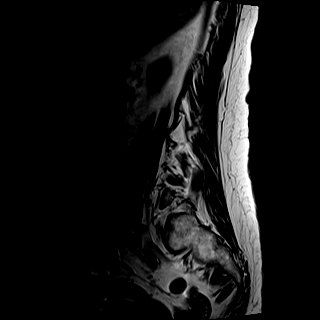

[Series 8: T2 · axial · 4.0mm · 0.57mm/px · z∈[-91,+112]mm · 11 of 37 slices shown (2 of 2)]
[im 1/37]
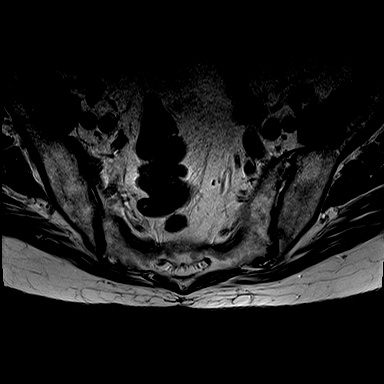
[im 4/37]
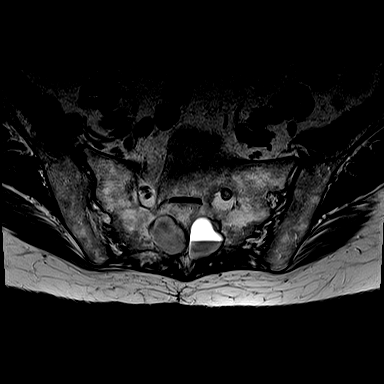
[im 8/37]
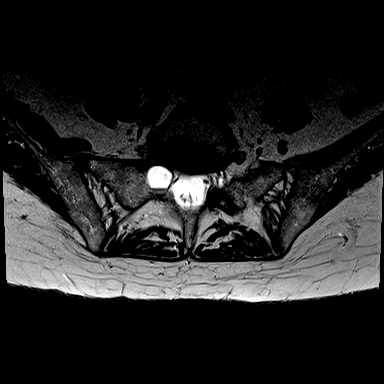
[im 11/37]
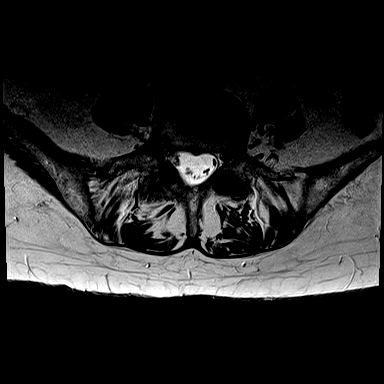
[im 15/37]
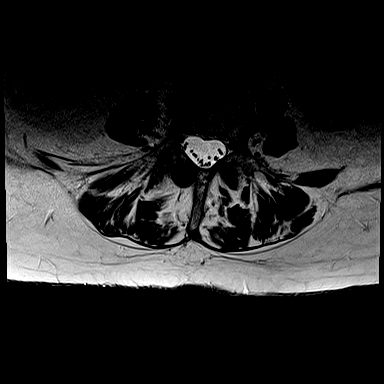
[im 19/37]
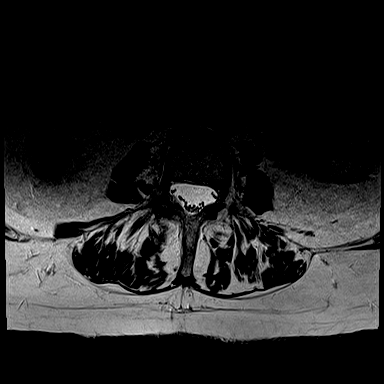
[im 22/37]
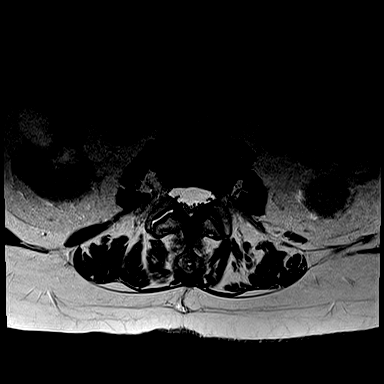
[im 26/37]
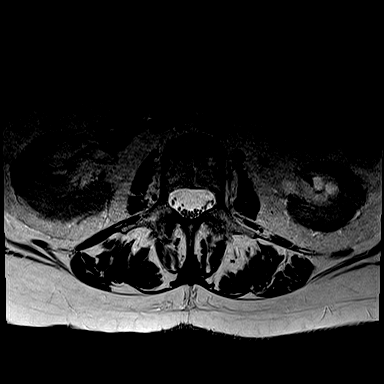
[im 29/37]
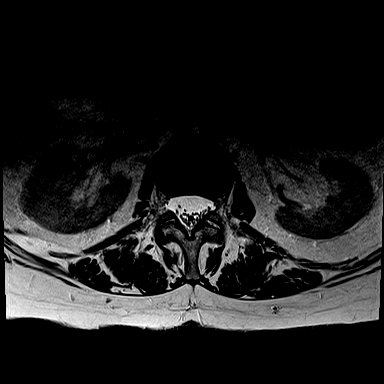
[im 33/37]
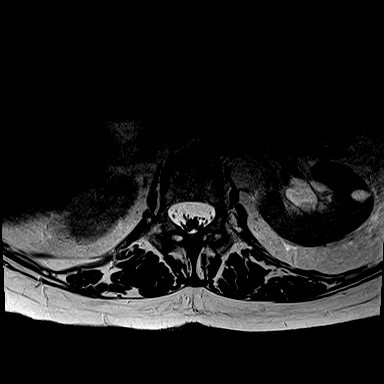
[im 37/37]
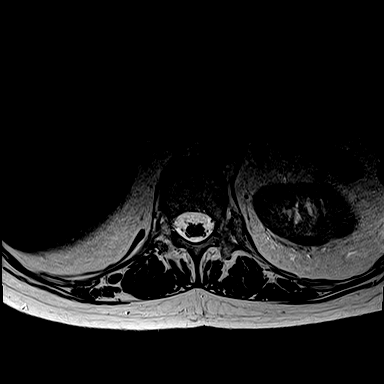

[Series 9: T1 · axial · 4.0mm · 0.34mm/px · z∈[-91,+92]mm · 4 of 37 slices shown (2 of 2)]
[im 1/37]
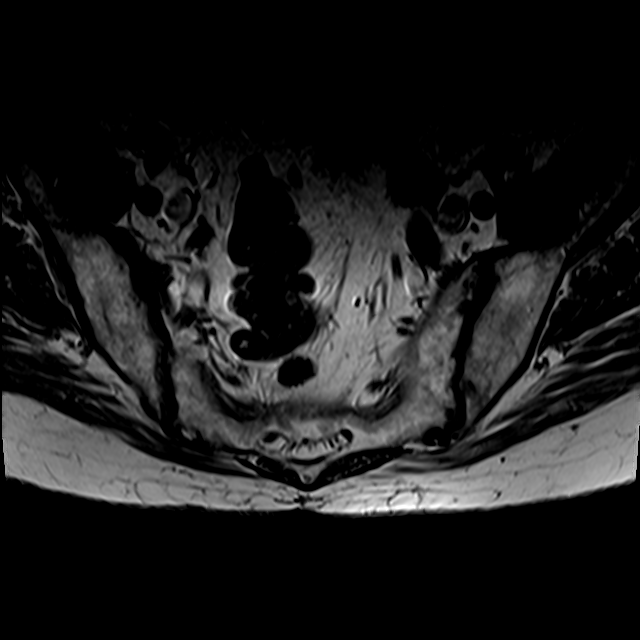
[im 4/37]
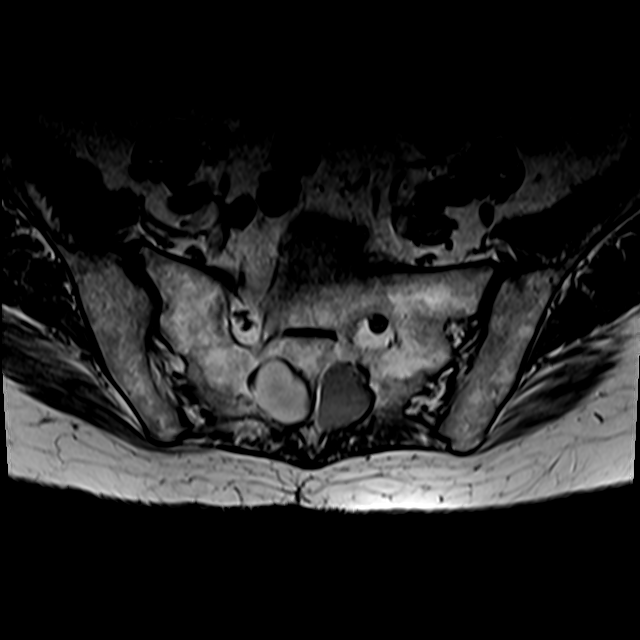
[im 19/37]
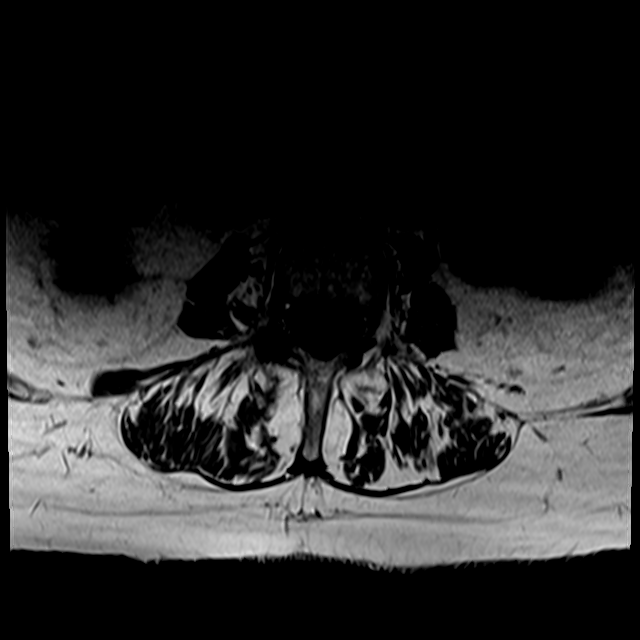
[im 33/37]
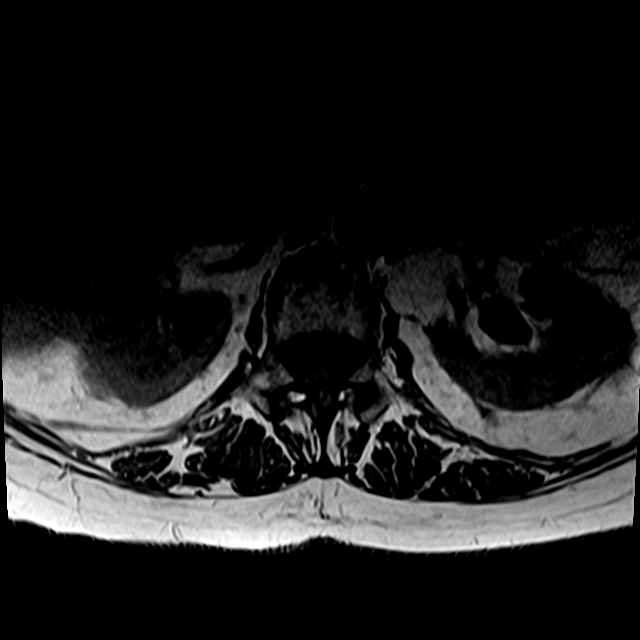

[25 of 48 positions shown; findings below may reference images not displayed]

FINDINGS: MRI CERVICAL SPINE FINDINGS

Alignment: Physiologic.

Vertebrae: No acute fracture or suspicious osseous lesion.

Cord: Normal signal and morphology.

Posterior Fossa, vertebral arteries, paraspinal tissues: Negative.

Disc levels:

C2-C3: No significant disc bulge. No spinal canal stenosis or
neuroforaminal narrowing.

C3-C4: No significant disc bulge. No spinal canal stenosis or
neuroforaminal narrowing.

C4-C5: No significant disc bulge. No spinal canal stenosis or
neuroforaminal narrowing.

C5-C6: No significant disc bulge. No spinal canal stenosis or
neuroforaminal narrowing.

C6-C7: Disc height loss with mild disc bulge, which indents the
ventral spinal cord. Uncovertebral and facet arthropathy. No spinal
canal stenosis. Mild bilateral neural foraminal narrowing.

C7-T1: No significant disc bulge. No spinal canal stenosis or
neuroforaminal narrowing.

MRI THORACIC SPINE FINDINGS

Alignment: Levocurvature of the upper thoracic spine,
dextrocurvature of the mid to lower thoracic spine. No significant
antero or retrolisthesis.

Vertebrae: No acute fracture or suspicious osseous lesion.

Cord:  Normal signal and morphology.

Paraspinal and other soft tissues: Negative.

Disc levels: No spinal canal stenosis or neural foraminal narrowing.

MRI LUMBAR SPINE FINDINGS

Segmentation: 5 lumbar-type vertebral bodies. Partial sacralization
of L5 with left L5-S1 pseudoarticulation of a broadened L5
transverse process with the sacrum (series 5, image 17).

Alignment: Levocurvature of the lumbar spine. 3 mm anterolisthesis
L4 on L5.

Vertebrae: No acute fracture or suspicious osseous lesion. Endplate
degenerative changes at L3-L4. Multiple Tarlov cysts in the sacrum,
several of which have fluid-fluid levels (series 8, image 31 and
series 8, image 34) or signal that is not consistent with appear CSF
(on the right in series 8, image 34). In the most inferior left
Tarlov cyst, there is a fluid fluid level with T1 hypointense and T2
hyperintense material above T1 hypointense and T2 hypointense
material, most likely CSF on an additional component, possibly
hemorrhage.

Conus medullaris and cauda equina: Conus extends to the L1-L2 level.
Conus and cauda equina appear normal.

Paraspinal and other soft tissues: Bilateral renal cysts.
Diverticulosis

Disc levels:

T12-L1: No significant disc bulge. No spinal canal stenosis or
neural foraminal narrowing.

L1-L2: Mild disc desiccation and small central disc protrusion. No
spinal canal stenosis or neural foraminal narrowing.

L2-L3: Mild disc bulge. Mild facet arthropathy. No spinal canal
stenosis or neural foraminal narrowing.

L3-L4: Mild disc bulge. Mild facet arthropathy. No spinal canal
stenosis or neural foraminal narrowing.

L4-L5: Trace anterolisthesis with disc unroofing. Mild disc bulge.
Severe facet arthropathy. No spinal canal stenosis or neural
foraminal narrowing.

L5-S1: Mild disc bulge. Mild facet arthropathy. No spinal canal
stenosis or neural foraminal narrowing.
IMPRESSION: 1. Tarlov cysts in the sacrum, with fluid-fluid levels, with the
dependent portion T1 and T2 hypointense, possibly hemorrhage. No
acute abnormality is seen in the remainder of the spinal canal as a
source for the hemorrhage, suggesting a cerebral source.
2. Multifocal curvature of the thoracolumbar spine, without
significant spinal canal stenosis or neural foraminal narrowing.
3. C6-C7 mild bilateral neural foraminal narrowing. No spinal canal
stenosis or other neural foraminal narrowing in the cervical spine.
4. No acute fracture or suspicious lesion.

These results were called by telephone at the time of interpretation
on [DATE] at [DATE] to provider MADDI , who verbally
acknowledged these results.

## 2021-07-26 IMAGING — MR MR THORACIC SPINE W/O CM
5 of 6 series · 23 of 48 positions shown · non-contrast
Comparison: No prior MRI of the cervical, thoracic, or lumbar
spine. Correlation is made with CT lumbar spine [DATE]

CLINICAL DATA: Neck, mid back, and low back pain, with bilateral
leg and buttock pain

EXAM:
MRI CERVICAL, THORACIC AND LUMBAR SPINE WITHOUT CONTRAST
TECHNIQUE: Multiplanar and multiecho pulse sequences of the cervical spine, to
include the craniocervical junction and cervicothoracic junction,
and thoracic and lumbar spine, were obtained without intravenous
contrast.

[Series 14: T1 · sagittal · 6.0mm · 1.33mm/px · 2 of 9 slices shown (1 of 2)]
[im 1/9]
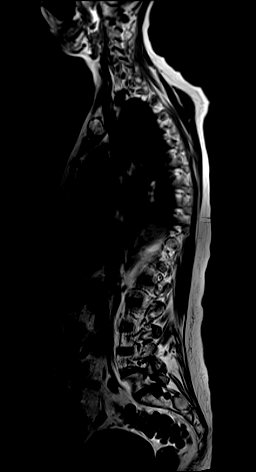
[im 9/9]
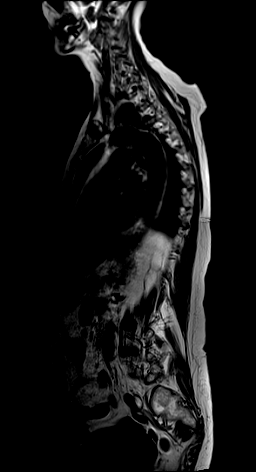

[Series 15: T2 · sagittal · 3.0mm · 0.76mm/px · 6 of 17 slices shown (1 of 2)]
[im 1/17]
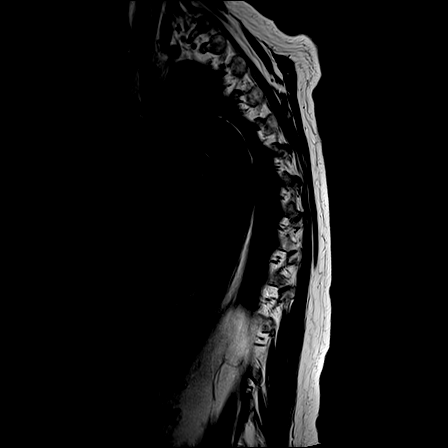
[im 4/17]
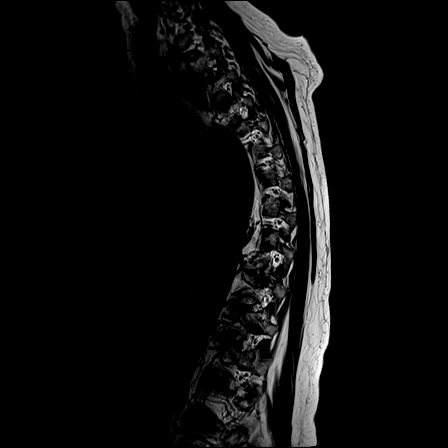
[im 7/17]
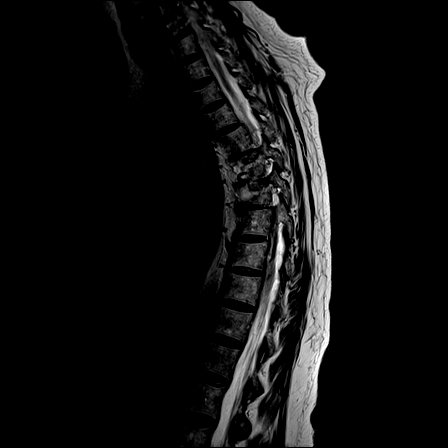
[im 10/17]
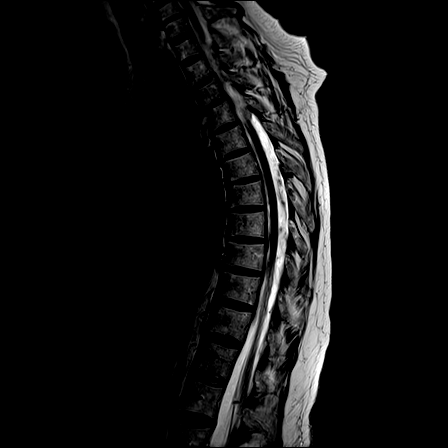
[im 13/17]
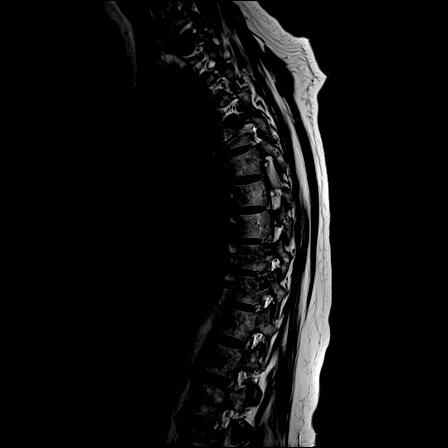
[im 17/17]
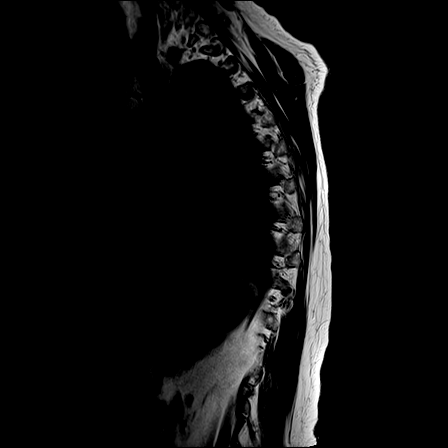

[Series 16: T1 · sagittal · 3.0mm · 0.76mm/px · 6 of 17 slices shown (2 of 2)]
[im 1/17]
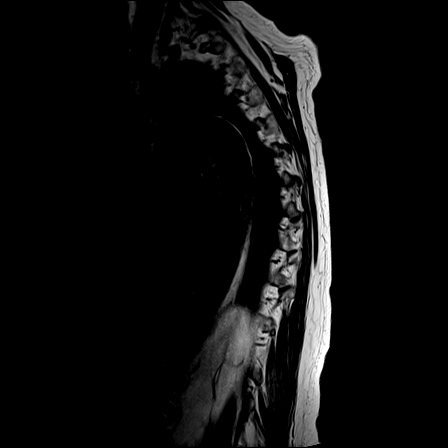
[im 4/17]
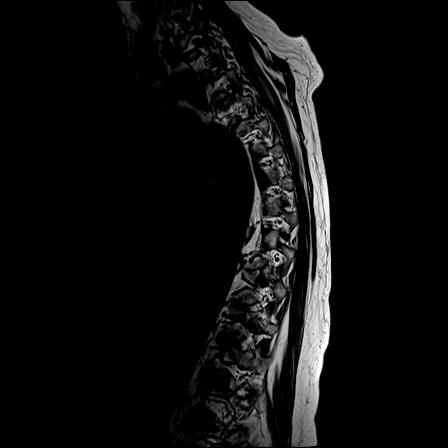
[im 7/17]
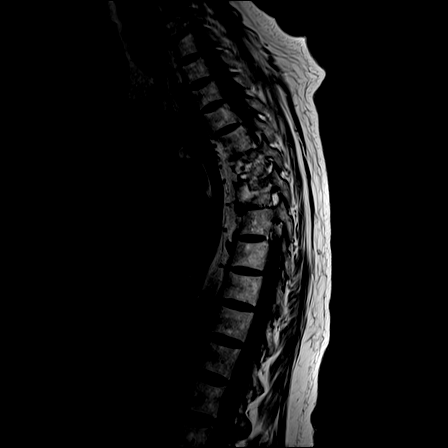
[im 10/17]
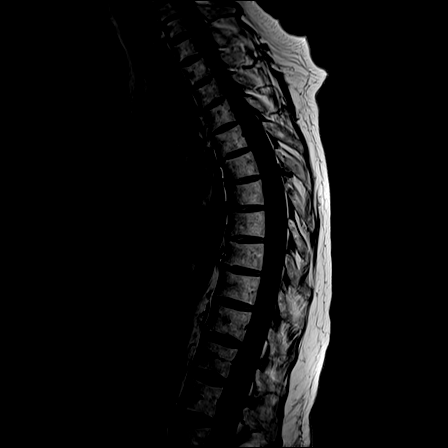
[im 13/17]
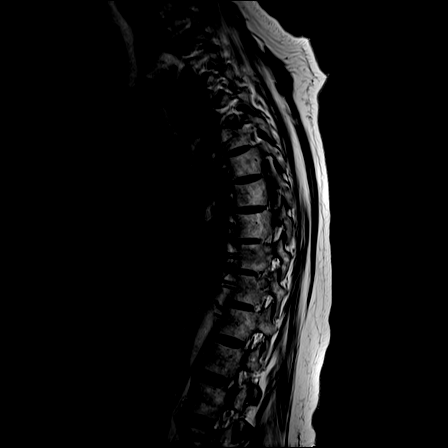
[im 17/17]
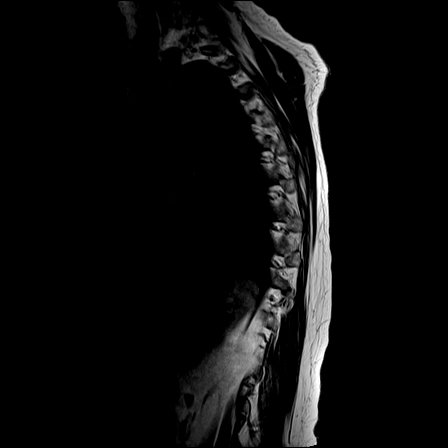

[Series 17: STIR · sagittal · 3.0mm · 0.38mm/px · 1 of 17 slices shown]
[im 1/17]
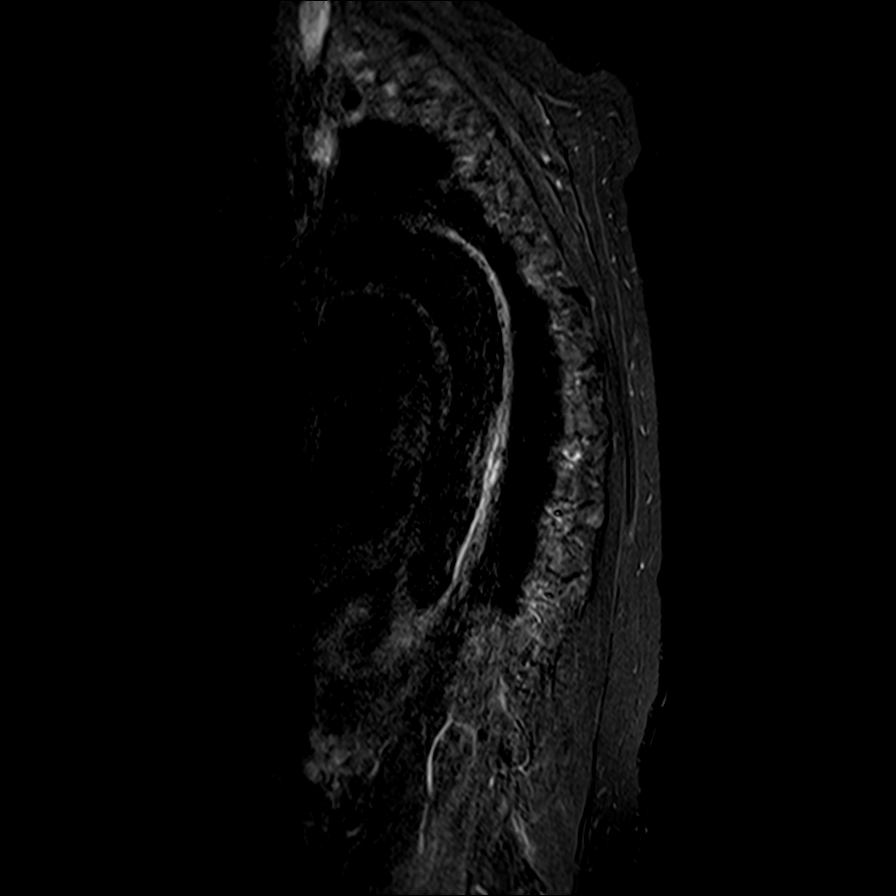

[Series 18: T2 · axial · 4.0mm · 0.59mm/px · z∈[-311,-98]mm · 8 of 39 slices shown (2 of 2)]
[im 1/39]
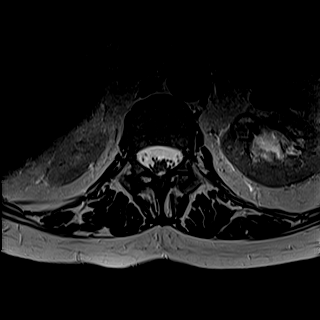
[im 6/39]
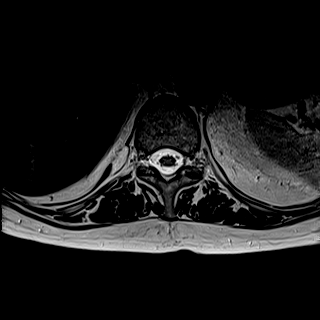
[im 12/39]
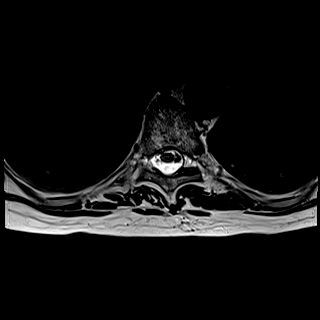
[im 18/39]
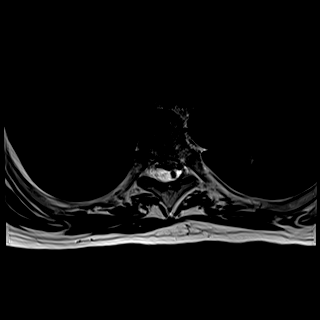
[im 21/39]
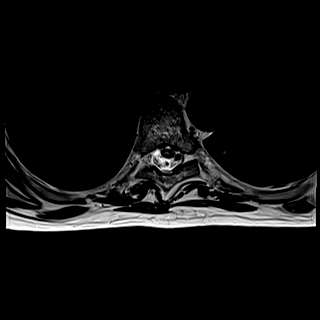
[im 27/39]
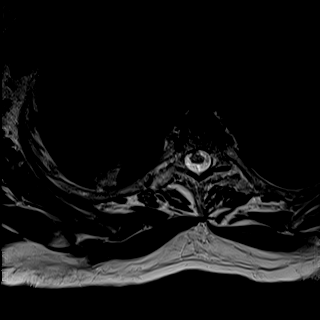
[im 33/39]
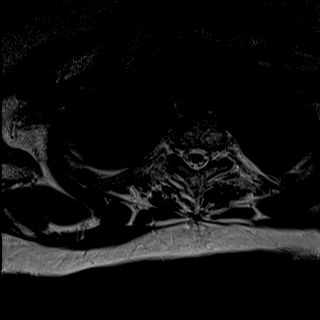
[im 39/39]
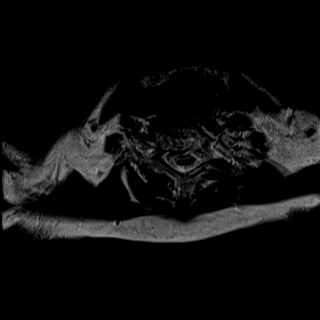

[23 of 48 positions shown; findings below may reference images not displayed]

FINDINGS: MRI CERVICAL SPINE FINDINGS

Alignment: Physiologic.

Vertebrae: No acute fracture or suspicious osseous lesion.

Cord: Normal signal and morphology.

Posterior Fossa, vertebral arteries, paraspinal tissues: Negative.

Disc levels:

C2-C3: No significant disc bulge. No spinal canal stenosis or
neuroforaminal narrowing.

C3-C4: No significant disc bulge. No spinal canal stenosis or
neuroforaminal narrowing.

C4-C5: No significant disc bulge. No spinal canal stenosis or
neuroforaminal narrowing.

C5-C6: No significant disc bulge. No spinal canal stenosis or
neuroforaminal narrowing.

C6-C7: Disc height loss with mild disc bulge, which indents the
ventral spinal cord. Uncovertebral and facet arthropathy. No spinal
canal stenosis. Mild bilateral neural foraminal narrowing.

C7-T1: No significant disc bulge. No spinal canal stenosis or
neuroforaminal narrowing.

MRI THORACIC SPINE FINDINGS

Alignment: Levocurvature of the upper thoracic spine,
dextrocurvature of the mid to lower thoracic spine. No significant
antero or retrolisthesis.

Vertebrae: No acute fracture or suspicious osseous lesion.

Cord:  Normal signal and morphology.

Paraspinal and other soft tissues: Negative.

Disc levels: No spinal canal stenosis or neural foraminal narrowing.

MRI LUMBAR SPINE FINDINGS

Segmentation: 5 lumbar-type vertebral bodies. Partial sacralization
of L5 with left L5-S1 pseudoarticulation of a broadened L5
transverse process with the sacrum (series 5, image 17).

Alignment: Levocurvature of the lumbar spine. 3 mm anterolisthesis
L4 on L5.

Vertebrae: No acute fracture or suspicious osseous lesion. Endplate
degenerative changes at L3-L4. Multiple Tarlov cysts in the sacrum,
several of which have fluid-fluid levels (series 8, image 31 and
series 8, image 34) or signal that is not consistent with appear CSF
(on the right in series 8, image 34). In the most inferior left
Tarlov cyst, there is a fluid fluid level with T1 hypointense and T2
hyperintense material above T1 hypointense and T2 hypointense
material, most likely CSF on an additional component, possibly
hemorrhage.

Conus medullaris and cauda equina: Conus extends to the L1-L2 level.
Conus and cauda equina appear normal.

Paraspinal and other soft tissues: Bilateral renal cysts.
Diverticulosis

Disc levels:

T12-L1: No significant disc bulge. No spinal canal stenosis or
neural foraminal narrowing.

L1-L2: Mild disc desiccation and small central disc protrusion. No
spinal canal stenosis or neural foraminal narrowing.

L2-L3: Mild disc bulge. Mild facet arthropathy. No spinal canal
stenosis or neural foraminal narrowing.

L3-L4: Mild disc bulge. Mild facet arthropathy. No spinal canal
stenosis or neural foraminal narrowing.

L4-L5: Trace anterolisthesis with disc unroofing. Mild disc bulge.
Severe facet arthropathy. No spinal canal stenosis or neural
foraminal narrowing.

L5-S1: Mild disc bulge. Mild facet arthropathy. No spinal canal
stenosis or neural foraminal narrowing.
IMPRESSION: 1. Tarlov cysts in the sacrum, with fluid-fluid levels, with the
dependent portion T1 and T2 hypointense, possibly hemorrhage. No
acute abnormality is seen in the remainder of the spinal canal as a
source for the hemorrhage, suggesting a cerebral source.
2. Multifocal curvature of the thoracolumbar spine, without
significant spinal canal stenosis or neural foraminal narrowing.
3. C6-C7 mild bilateral neural foraminal narrowing. No spinal canal
stenosis or other neural foraminal narrowing in the cervical spine.
4. No acute fracture or suspicious lesion.

These results were called by telephone at the time of interpretation
on [DATE] at [DATE] to provider MADDI , who verbally
acknowledged these results.

## 2021-07-26 IMAGING — MR MR CERVICAL SPINE W/O CM
5 series · 33 of 48 positions shown · non-contrast
Comparison: No prior MRI of the cervical, thoracic, or lumbar
spine. Correlation is made with CT lumbar spine [DATE]

CLINICAL DATA: Neck, mid back, and low back pain, with bilateral
leg and buttock pain

EXAM:
MRI CERVICAL, THORACIC AND LUMBAR SPINE WITHOUT CONTRAST
TECHNIQUE: Multiplanar and multiecho pulse sequences of the cervical spine, to
include the craniocervical junction and cervicothoracic junction,
and thoracic and lumbar spine, were obtained without intravenous
contrast.

[Series 5: T2 · sagittal · 3.0mm · 0.69mm/px · 6 of 15 slices shown (1 of 2)]
[im 1/15]
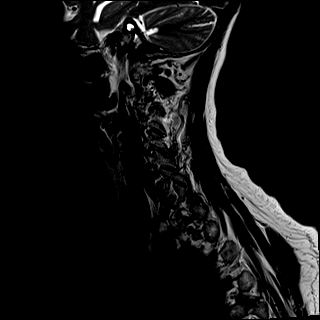
[im 3/15]
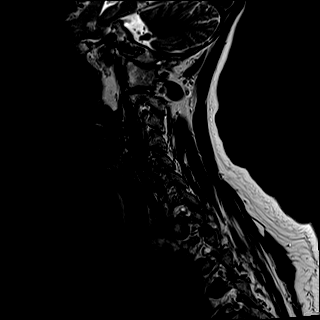
[im 6/15]
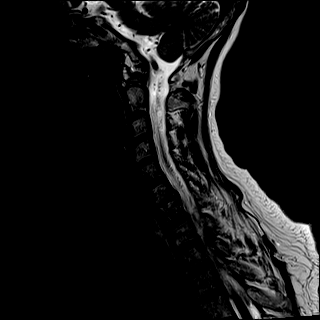
[im 9/15]
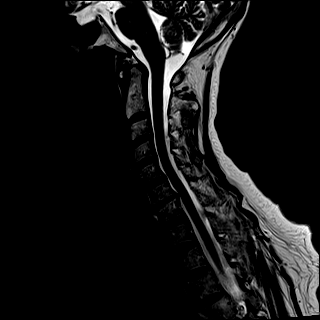
[im 12/15]
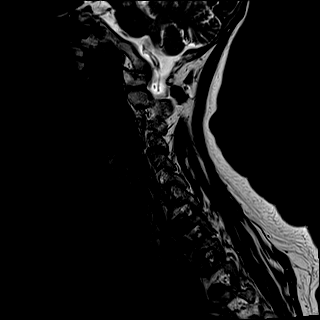
[im 15/15]
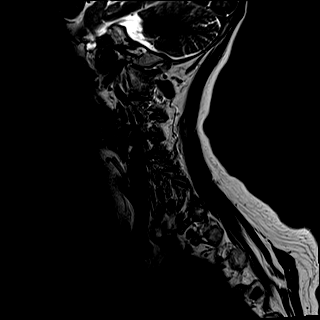

[Series 6: T1 · sagittal · 3.0mm · 0.69mm/px · 6 of 15 slices shown]
[im 1/15]
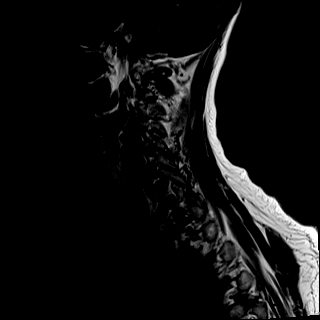
[im 3/15]
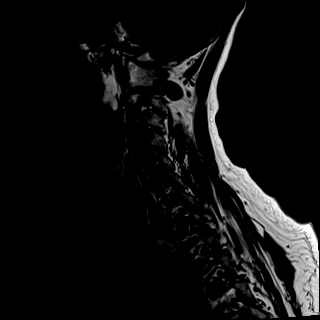
[im 6/15]
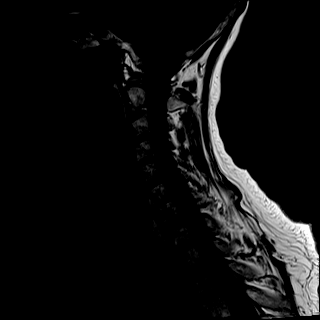
[im 9/15]
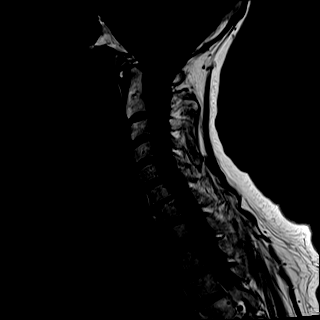
[im 12/15]
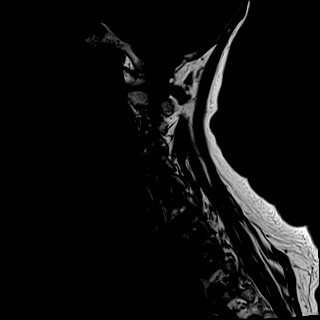
[im 15/15]
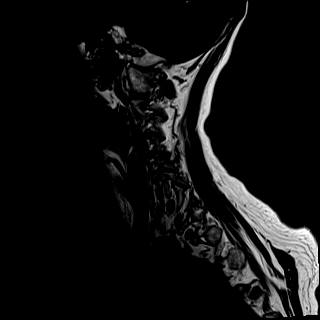

[Series 7: STIR · sagittal · 3.0mm · 0.86mm/px · 6 of 15 slices shown]
[im 1/15]
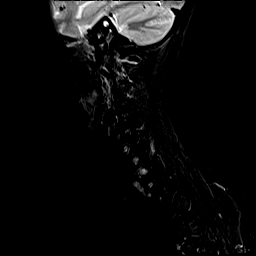
[im 3/15]
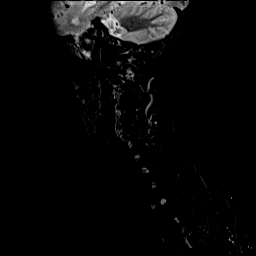
[im 6/15]
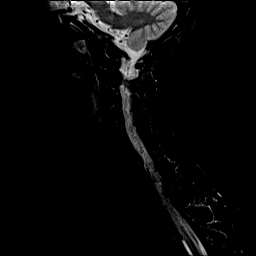
[im 9/15]
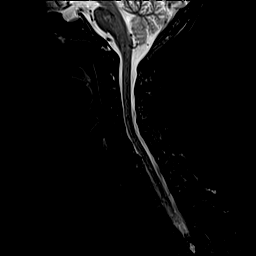
[im 12/15]
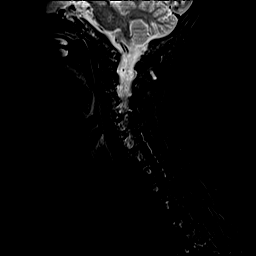
[im 15/15]
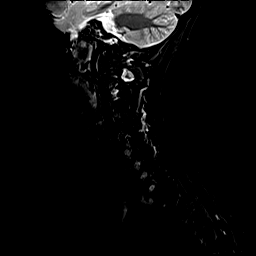

[Series 8: T2 · axial · 3.0mm · 0.66mm/px · z∈[-128,-5]mm · 9 of 40 slices shown (2 of 2)]
[im 1/40]
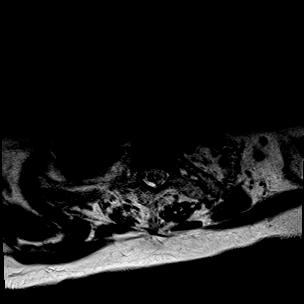
[im 6/40]
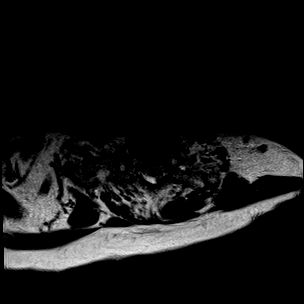
[im 12/40]
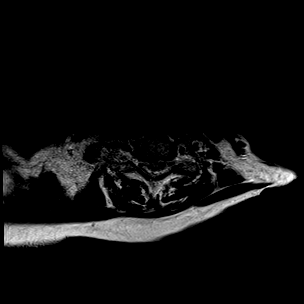
[im 17/40]
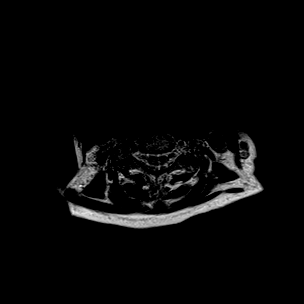
[im 20/40]
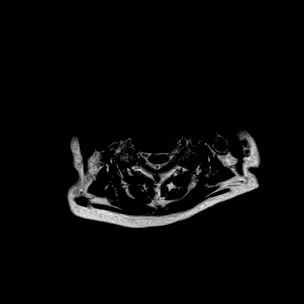
[im 23/40]
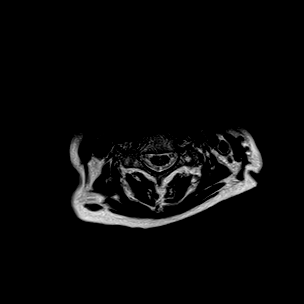
[im 28/40]
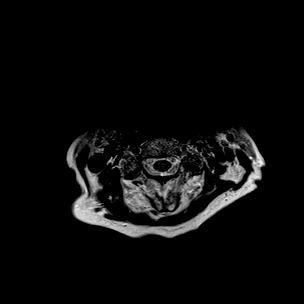
[im 34/40]
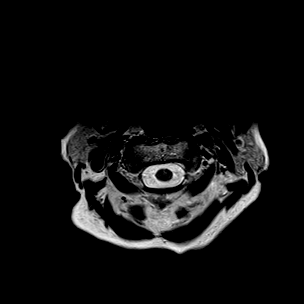
[im 40/40]
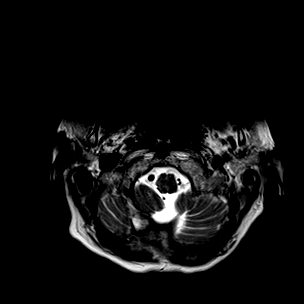

[Series 9: GRE · axial · 3.0mm · 0.39mm/px · z∈[-128,-43]mm · 6 of 40 slices shown]
[im 1/40]
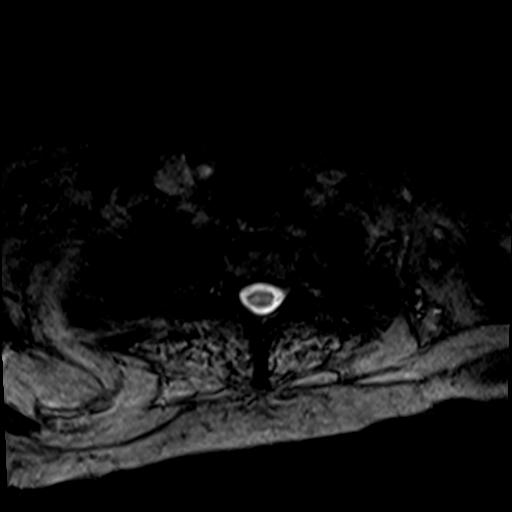
[im 6/40]
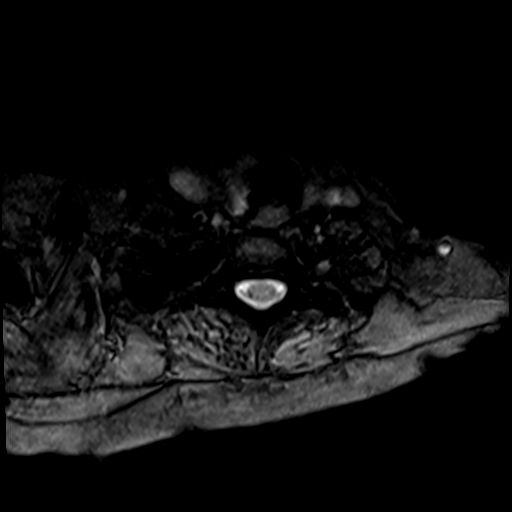
[im 12/40]
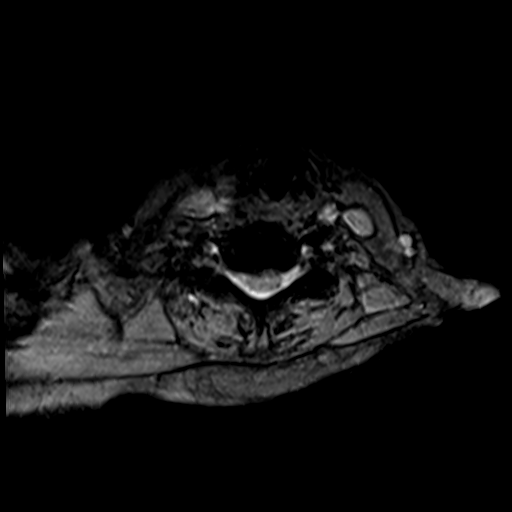
[im 17/40]
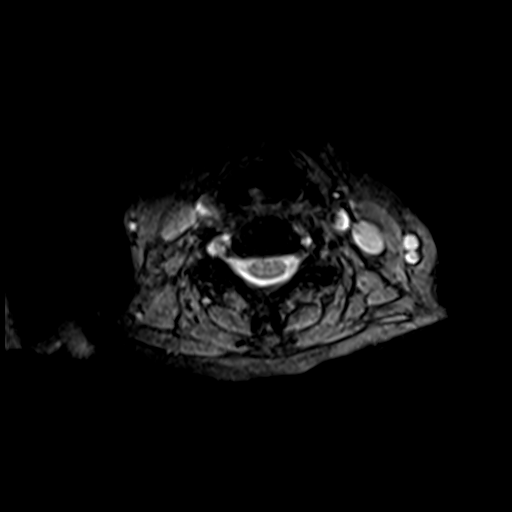
[im 23/40]
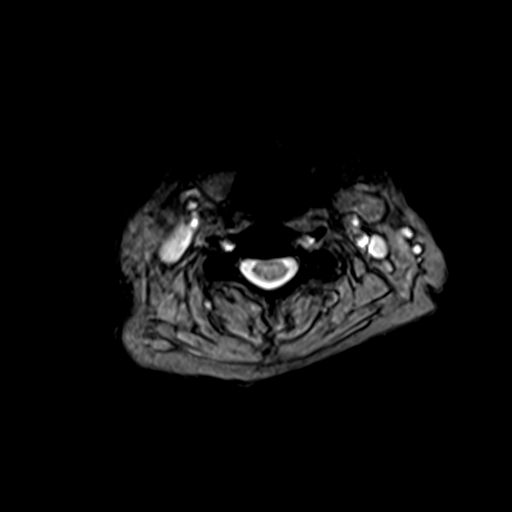
[im 28/40]
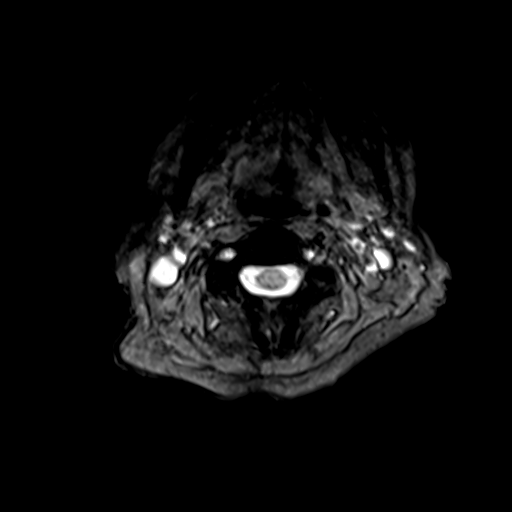

[33 of 48 positions shown; findings below may reference images not displayed]

FINDINGS: MRI CERVICAL SPINE FINDINGS

Alignment: Physiologic.

Vertebrae: No acute fracture or suspicious osseous lesion.

Cord: Normal signal and morphology.

Posterior Fossa, vertebral arteries, paraspinal tissues: Negative.

Disc levels:

C2-C3: No significant disc bulge. No spinal canal stenosis or
neuroforaminal narrowing.

C3-C4: No significant disc bulge. No spinal canal stenosis or
neuroforaminal narrowing.

C4-C5: No significant disc bulge. No spinal canal stenosis or
neuroforaminal narrowing.

C5-C6: No significant disc bulge. No spinal canal stenosis or
neuroforaminal narrowing.

C6-C7: Disc height loss with mild disc bulge, which indents the
ventral spinal cord. Uncovertebral and facet arthropathy. No spinal
canal stenosis. Mild bilateral neural foraminal narrowing.

C7-T1: No significant disc bulge. No spinal canal stenosis or
neuroforaminal narrowing.

MRI THORACIC SPINE FINDINGS

Alignment: Levocurvature of the upper thoracic spine,
dextrocurvature of the mid to lower thoracic spine. No significant
antero or retrolisthesis.

Vertebrae: No acute fracture or suspicious osseous lesion.

Cord:  Normal signal and morphology.

Paraspinal and other soft tissues: Negative.

Disc levels: No spinal canal stenosis or neural foraminal narrowing.

MRI LUMBAR SPINE FINDINGS

Segmentation: 5 lumbar-type vertebral bodies. Partial sacralization
of L5 with left L5-S1 pseudoarticulation of a broadened L5
transverse process with the sacrum (series 5, image 17).

Alignment: Levocurvature of the lumbar spine. 3 mm anterolisthesis
L4 on L5.

Vertebrae: No acute fracture or suspicious osseous lesion. Endplate
degenerative changes at L3-L4. Multiple Tarlov cysts in the sacrum,
several of which have fluid-fluid levels (series 8, image 31 and
series 8, image 34) or signal that is not consistent with appear CSF
(on the right in series 8, image 34). In the most inferior left
Tarlov cyst, there is a fluid fluid level with T1 hypointense and T2
hyperintense material above T1 hypointense and T2 hypointense
material, most likely CSF on an additional component, possibly
hemorrhage.

Conus medullaris and cauda equina: Conus extends to the L1-L2 level.
Conus and cauda equina appear normal.

Paraspinal and other soft tissues: Bilateral renal cysts.
Diverticulosis

Disc levels:

T12-L1: No significant disc bulge. No spinal canal stenosis or
neural foraminal narrowing.

L1-L2: Mild disc desiccation and small central disc protrusion. No
spinal canal stenosis or neural foraminal narrowing.

L2-L3: Mild disc bulge. Mild facet arthropathy. No spinal canal
stenosis or neural foraminal narrowing.

L3-L4: Mild disc bulge. Mild facet arthropathy. No spinal canal
stenosis or neural foraminal narrowing.

L4-L5: Trace anterolisthesis with disc unroofing. Mild disc bulge.
Severe facet arthropathy. No spinal canal stenosis or neural
foraminal narrowing.

L5-S1: Mild disc bulge. Mild facet arthropathy. No spinal canal
stenosis or neural foraminal narrowing.
IMPRESSION: 1. Tarlov cysts in the sacrum, with fluid-fluid levels, with the
dependent portion T1 and T2 hypointense, possibly hemorrhage. No
acute abnormality is seen in the remainder of the spinal canal as a
source for the hemorrhage, suggesting a cerebral source.
2. Multifocal curvature of the thoracolumbar spine, without
significant spinal canal stenosis or neural foraminal narrowing.
3. C6-C7 mild bilateral neural foraminal narrowing. No spinal canal
stenosis or other neural foraminal narrowing in the cervical spine.
4. No acute fracture or suspicious lesion.

These results were called by telephone at the time of interpretation
on [DATE] at [DATE] to provider MADDI , who verbally
acknowledged these results.

## 2021-07-26 MED ORDER — ENOXAPARIN SODIUM 40 MG/0.4ML IJ SOSY
40.0000 mg | PREFILLED_SYRINGE | INTRAMUSCULAR | Status: DC
  Administered 2021-07-26 – 2021-07-27 (×2): 40 mg via SUBCUTANEOUS
  Filled 2021-07-26 (×2): qty 0.4

## 2021-07-26 MED ORDER — KETOROLAC TROMETHAMINE 15 MG/ML IJ SOLN
15.0000 mg | Freq: Once | INTRAMUSCULAR | Status: AC
  Administered 2021-07-26: 15 mg via INTRAMUSCULAR
  Filled 2021-07-26: qty 1

## 2021-07-26 MED ORDER — ACETAMINOPHEN 650 MG RE SUPP: 650.0000 mg | Freq: Four times a day (QID) | RECTAL | Status: DC | PRN

## 2021-07-26 MED ORDER — SODIUM CHLORIDE 0.9% FLUSH
3.0000 mL | Freq: Two times a day (BID) | INTRAVENOUS | Status: DC
  Administered 2021-07-26 – 2021-07-28 (×4): 3 mL via INTRAVENOUS

## 2021-07-26 MED ORDER — HYDROCODONE-ACETAMINOPHEN 5-325 MG PO TABS
1.0000 | ORAL_TABLET | Freq: Once | ORAL | Status: AC
  Administered 2021-07-26: 1 via ORAL
  Filled 2021-07-26: qty 1

## 2021-07-26 MED ORDER — ONDANSETRON HCL 4 MG/2ML IJ SOLN: 4.0000 mg | Freq: Four times a day (QID) | INTRAMUSCULAR | Status: DC | PRN

## 2021-07-26 MED ORDER — ACETAMINOPHEN 325 MG PO TABS
650.0000 mg | ORAL_TABLET | Freq: Four times a day (QID) | ORAL | Status: DC | PRN
  Administered 2021-07-27 (×2): 650 mg via ORAL
  Filled 2021-07-26 (×2): qty 2

## 2021-07-26 MED ORDER — ONDANSETRON 4 MG PO TBDP
4.0000 mg | ORAL_TABLET | Freq: Once | ORAL | Status: AC
  Administered 2021-07-26: 4 mg via ORAL
  Filled 2021-07-26: qty 1

## 2021-07-26 MED ORDER — GABAPENTIN 300 MG PO CAPS
300.0000 mg | ORAL_CAPSULE | Freq: Three times a day (TID) | ORAL | Status: DC
  Administered 2021-07-26 – 2021-07-28 (×6): 300 mg via ORAL
  Filled 2021-07-26 (×6): qty 1

## 2021-07-26 MED ORDER — LORAZEPAM 2 MG/ML IJ SOLN
0.5000 mg | Freq: Once | INTRAMUSCULAR | Status: AC | PRN
  Administered 2021-07-26: 0.5 mg via INTRAVENOUS
  Filled 2021-07-26: qty 1

## 2021-07-26 MED ORDER — ONDANSETRON HCL 4 MG PO TABS
4.0000 mg | ORAL_TABLET | Freq: Four times a day (QID) | ORAL | Status: DC | PRN
Start: 2021-07-26 — End: 2021-07-28

## 2021-07-26 MED ORDER — HYDRALAZINE HCL 20 MG/ML IJ SOLN: 10.0000 mg | INTRAMUSCULAR | Status: DC | PRN

## 2021-07-26 MED ORDER — ALBUTEROL SULFATE (2.5 MG/3ML) 0.083% IN NEBU: 2.5000 mg | INHALATION_SOLUTION | Freq: Four times a day (QID) | RESPIRATORY_TRACT | Status: DC | PRN

## 2021-07-26 NOTE — Assessment & Plan Note (Addendum)
Acute.  Patient's WBC count has been elevated since initial presentation to the ED on 1/23 prior to her ever receiving steroids.   -Check ESR and CRP

## 2021-07-26 NOTE — H&P (Addendum)
History and Physical    Patient: Desiree Ortega DOB: [birth year] DOA: 07/26/2021 DOS: the patient was seen and examined on 07/26/2021 PCP: Tonia Ghent, MD  Patient coming from: Home  Chief Complaint:  Chief Complaint  Patient presents with   Leg Pain    HPI: Desiree Ortega is a 76 y.o. female with medical history significant of hyperlipidemia, nephrolithiasis, and skin cancer presents with complaints of bilateral lower extremity pain over the last 12 days.  She had been seen in the emergency department on 1/23 with complaints of abdominal pain.  She was evaluated with CT scan of the abdomen and pelvis that did not show any clear signs of infection.  Urinalysis did not appear to show significant signs of infection, but she was treated empirically for possible UTI.  Patient came back to the emergency department due to uncontrolled pain the following day stating she was having abdominal and back pain with shooting pains down both of her legs.  Repeat CT scans of the abdomen and pelvis  gave concern for acute cholecystitis, and subsequent ultrasound of the abdomen which noted trace pericholecystic fluid with negative sonographic Murphy sign.  General surgery was consulted, but felt symptoms were very unlikely coming from her gallbladder.  She had been treated with morphine and ketorolac with improvement in symptoms and ultimately was discharged.  She followed up with her primary care provider who had put her on last steroid Dosepak on 1/27.  Despite this patient and the pain persisted in her hips and radiates down the back of her leg to her knee and is worse on the right than on the left.  Sitting makes symptoms worse.  Denies any recent falls, trauma, or heavy lifting to onset symptoms.  Daughter notes that the patient had been having to press on her stomach in order to urinate.  Patient denies having any saddle anesthesia, urinary/fecal incontinence, nausea, vomiting fever, chills,  cough, shortness of breath, or chest pain.  Patient is normally very healthy and is not on any medications per except for multivitamins.  Patient had been given gabapentin 100 mg to take at night 1/30 by her PCP but noted no change in symptoms.  Patient return to the emergency department 2/3 and had CT scan of the lumbar spine which showed degenerative changes with foraminal stenosis multilevel degenerative changes with stenosis present.  She had been given hydrocodone and ketorolac in the ED with improvement in pain symptoms.  She was discharged home on naproxen, but states that it did not touch her pain after getting home for which she return to the hospital.  Her daughter also brings up the fact that if she bends her neck forward that she has a sharp shooting pain running down her neck.  In the ED patient was noted to be hypertensive up to 183/87.  No initial lab work had been obtained.  Neurosurgery have been consulted and recommended obtaining MRIs of the spine for further evaluation.   Review of Systems: As mentioned in the history of present illness. All other systems reviewed and are negative. 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 Evorn Gong   Diverticulosis    Moderate   Female bladder prolapse    H/O cold sores    History of colon polyps    History of kidney stones    Hyperlipidemia    Migraine with  aura    Osteopenia    prev on fosamax for a few years then started evista at age ~24, T score improved  from -2.29/-1.96 to -1.8/-1.7 as of 06/2011, repeat DXA done 2015   Past Surgical History:  Procedure Laterality Date   BREAST BIOPSY Left    benign   CATARACT EXTRACTION W/ INTRAOCULAR LENS IMPLANT Left 11/2016   CATARACT EXTRACTION W/ INTRAOCULAR LENS IMPLANT Right 12/22/2016   COLONOSCOPY  08/15/2013   cyst on back     2 times   cyst on eyelid     right eye   CYSTOCELE REPAIR N/A  02/01/2018   Procedure: ANTERIOR REPAIR (CYSTOCELE);  Surgeon: Bjorn Loser, MD;  Location: [facility];  Service: Urology;  Laterality: N/A;   CYSTOSCOPY N/A 02/01/2018   Procedure: cystoscopy;  Surgeon: Bjorn Loser, MD;  Location: [facility];  Service: Urology;  Laterality: N/A;   LAPAROSCOPIC VAGINAL HYSTERECTOMY WITH SALPINGO OOPHORECTOMY Bilateral 02/01/2018   Procedure: LAPAROSCOPIC ASSISTED VAGINAL HYSTERECTOMY WITH BILATERAL SALPINGO OOPHORECTOMY;  Surgeon: Servando Salina, MD;  Location: [facility];  Service: Gynecology;  Laterality: Bilateral;   SQUAMOUS CELL CARCINOMA EXCISION     on back/removed 2 times/and thigh   SQUAMOUS CELL CARCINOMA EXCISION Left 07/15/2016   left lower calf   SSC removed  1988   Birthmark removal partial 1/3   TONSILLECTOMY  1950   TUBAL LIGATION  1980's   Social History:  reports that she quit smoking about 20 years ago. Her smoking use included cigarettes. She has a 18.00 pack-year smoking history. She has never used smokeless tobacco. She reports current alcohol use of about 2.0 standard drinks per week. She reports that she does not use drugs.  Allergies  Allergen Reactions   Alendronate Sodium Other (See Comments)    heartburn   Codeine Nausea Only   Influenza Vaccines     Local reaction to vaccine in 2014   Keflex [Cephalexin] Rash    All around her torso area    Family History  Problem Relation Age of Onset   Osteopenia Mother    Hypertension Father    Heart disease Father        AFIB, PVD stents LE's, pacer   Stroke Father    Cancer Brother        prostate, prostatectomy, radiation 5 years later, Hormone Tx   Prostate cancer Brother    Heart disease Brother    Cancer Brother        prostate, prostatectomy   Prostate cancer Brother    Diabetes Other    Cancer Other        Non-Hodgkin's Lymphoma   Depression Daughter    Alcohol abuse Neg Hx    Drug abuse Neg Hx    Colon cancer Neg Hx    Breast cancer Neg Hx    Bladder Cancer  Neg Hx    Kidney cancer Neg Hx     Prior to Admission medications   Medication Sig Start Date End Date Taking? Authorizing Provider  acetaminophen (TYLENOL) 500 MG tablet Take 500 mg by mouth 2 (two) times daily as needed (for pain.).    [provider]  Ascorbic Acid (VITAMIN C) 1000 MG tablet Take 1 tablet (1,000 mg total) by mouth daily. 08/21/18   Tonia Ghent, MD  b complex vitamins tablet Take 1 tablet by mouth daily.    [provider]  Cholecalciferol (VITAMIN D PO) Take by mouth.    [provider]  CINNAMON PO  Take 1,000 mg by mouth daily.    [provider]  Cyanocobalamin (B-12 PO) Take by mouth.    [provider]  fexofenadine (ALLEGRA) 180 MG tablet Take 1 tablet (180 mg total) by mouth as needed. Seasonal allergies from Spring to Fall 08/21/18   Tonia Ghent, MD  Flaxseed, Linseed, (FLAX SEEDS PO) Take by mouth.    [provider]  gabapentin (NEURONTIN) 100 MG capsule Take 1 capsule (100 mg total) by mouth at bedtime. DO NOT take with the hydrocodone 07/21/21   Michela Pitcher, NP  HYDROcodone-acetaminophen (NORCO/VICODIN) 5-325 MG tablet Take 1 tablet by mouth every 6 (six) hours as needed for up to 5 doses for severe pain. Patient not taking: Reported on 07/18/2021 07/16/21   Evlyn Courier, PA-C  Magnesium 250 MG TABS Take 250 mg by mouth daily.    [provider]  Melatonin 3 MG TABS Take 1 tablet (3 mg total) by mouth at bedtime as needed. 08/21/18   Tonia Ghent, MD  Multiple Vitamin (MULTIVITAMIN) capsule Take 1 capsule by mouth daily. 08/21/18   Tonia Ghent, MD  naproxen (NAPROSYN) 375 MG tablet Take 1 tablet (375 mg total) by mouth 2 (two) times daily. 07/25/21   Kommor, [place], MD  Omega-3 Fatty Acids (FISH OIL PO) Take by mouth.    [provider]  Red Yeast Rice Extract 600 MG CAPS Take 1 capsule (600 mg total) by mouth daily. 08/23/20   Tonia Ghent, MD  Turmeric 500 MG CAPS Take by mouth.     [provider]  Ubiquinol 100 MG CAPS Take 100 mg by mouth daily. 08/21/18   Tonia Ghent, MD  Zinc 50 MG CAPS Take by mouth.    [provider]    Physical Exam: Vitals:   07/26/21 1415 07/26/21 1430 07/26/21 1500 07/26/21 1600  BP:  (!) 160/74 (!) 159/73 (!) 167/83  Pulse: 97 86 85 76  Resp: 20 18 18  (!) 21  Temp: 98.1 F (36.7 C)     TempSrc: Oral     SpO2: 99% 100% 96% 96%  Weight:      Height:       Exam  Constitutional: Elderly female who appears to be in some discomfort Eyes: PERRL, lids and conjunctivae normal ENMT: Mucous membranes are moist. Posterior pharynx clear of any exudate or lesions.  Neck: normal, supple, no masses.  No JVD. Respiratory: clear to auscultation bilaterally, no wheezing, no crackles. Normal respiratory effort. No accessory muscle use.  Cardiovascular: Regular rate and rhythm, no murmur appreciated. Abdomen: no tenderness, no masses palpated.  Bowel sounds present Musculoskeletal: no clubbing / cyanosis.  Patient complains of pain radiating down her legs with straight leg testing at about 30-45 degrees on each leg.  No point tenderness or muscle spasms appreciated of the back. Skin: No induration Neurologic: CN 2-12 grossly intact.  Able to move all extremities.Marland Kitchen  Psychiatric: Normal judgment and insight. Alert and oriented x 3. Normal mood.    Data Reviewed: Labs once obtained significant for WBC 13.3.  Assessment and Plan: Bilateral leg pain secondary to radiculopathy Patient presents with complaints of pain running down both legs worse on the right especially with certain positions.  Also noted similar pains when bending neck forward.  Denies any recent trauma or injury to onset symptoms.  CT imaging of the lumbar spine had noted multilevel degenerative disease with spinal and foraminal stenosis.  Neurosurgery have been consulted and recommended obtaining  MRIs of the spine.  Patient's pain seemed to be improved with  Toradol and hydrocodone. -Admit to a medical telemetry bed -Hydrocodone as needed for pain -Increase gabapentin to 300 mg 3 times daily -PT to evaluate -Follow-up MRI of the spine -Rediscussed case with neurosurgery following MRI if needed   Elevated blood pressure reading- (present on admission) While in the ED patient was noted to have blood pressures elevated into the 180s.  Denies being on blood pressure medications at baseline and reports symptoms likely secondary to pain being uncontrolled. -Continue to monitor blood pressures -Hydralazine IV as needed for elevated blood pressure greater than 180   Leukocytosis- (present on admission) Acute.  Patient's WBC count has been elevated since initial presentation to the ED on 1/23 prior to her ever receiving steroids.   -Check ESR and CRP   HLD (hyperlipidemia)- (present on admission) Last available lipid panel from 08/19/2020 for total cholesterol was 257 with LDL of 174.  Patient reports that she was not on any medications for treatment.  Since that time she has lost weight and feels that it should be improved. -Recommend outpatient follow-up with her primary care provider        Advance Care Planning:   Code Status: Full Code  Consults: None  Family Communication: Daughter updated at bedside  Severity of Illness: The appropriate patient status for this patient is OBSERVATION. Observation status is judged to be reasonable and necessary in order to provide the required intensity of service to ensure the patient's safety. The patient's presenting symptoms, physical exam findings, and initial radiographic and laboratory data in the context of their medical condition is felt to place them at decreased risk for further clinical deterioration. Furthermore, it is anticipated that the patient will be medically stable for discharge from the hospital within 2 midnights of admission.   Author: Norval Morton, MD 07/26/2021 5:04 PM  For  on call review [email or web address].

## 2021-07-26 NOTE — ED Provider Notes (Signed)
Reviewed MRI images with Radiologist while on the phone with [place], [place]. There is an unusual finding in the sacrum of a Tarlov cyst with a fluid-fluid level. This was discussed with the Neurosurgery team who is aware and will review the images. They will call back with any additional recommendations. Patient is aware of this finding. She has not had any unusual or severe headaches recently to suggest any intracranial source of hemorrhage.    Truddie Hidden, MD 07/26/21 2259

## 2021-07-26 NOTE — Assessment & Plan Note (Signed)
Last available lipid panel from 08/19/2020 for total cholesterol was 257 with LDL of 174.  Patient reports that she was not on any medications for treatment.  Since that time she has lost weight and feels that it should be improved. -Recommend outpatient follow-up with her primary care provider

## 2021-07-26 NOTE — ED Notes (Addendum)
Calling 6N to check on status of pt being accepted to the floor.

## 2021-07-26 NOTE — ED Provider Notes (Signed)
[facility] EMERGENCY DEPARTMENT Provider Note   CSN: [id number] Arrival date & time: 07/26/21  1400     History  Chief Complaint  Patient presents with   Leg Pain    Desiree Ortega is a 76 y.o. female with history of cancer, diverticulosis, bladder prolapse, migraine.  Patient reports that for the last 2 weeks she has had bilateral lower extremity pain, right greater than left.  Patient states that the pain is constant and describes it as a "shooting pain that extends down my legs".  Patient denies any alleviating or aggravating factors.  Patient was seen for the same complaint yesterday by Dr. Matilde Sprang, given Toradol and hydrocodone which she stated relieved her pain and was discharged with plan for outpatient follow-up.  The patient returns today with the same complaint.  Of note, patient was seen on 23rd and 24 January for abdominal pain which she stated extended down into her legs similar to the complaint that she presents with today.  Patient denies any red flag symptoms such as groin numbness, bowel or bladder incontinence.  Patient denies shortness of breath, chest pain, abdominal pain, fevers, weakness, numbness, tingling.   Leg Pain Associated symptoms: no fever       Home Medications Prior to Admission medications   Medication Sig Start Date End Date Taking? Authorizing Provider  acetaminophen (TYLENOL) 500 MG tablet Take 500 mg by mouth 2 (two) times daily as needed (for pain.).    [provider]  Ascorbic Acid (VITAMIN C) 1000 MG tablet Take 1 tablet (1,000 mg total) by mouth daily. 08/21/18   Tonia Ghent, MD  b complex vitamins tablet Take 1 tablet by mouth daily.    [provider]  Cholecalciferol (VITAMIN D PO) Take by mouth.    [provider]  CINNAMON PO Take 1,000 mg by mouth daily.    [provider]  Cyanocobalamin (B-12 PO) Take by mouth.    [provider]  fexofenadine (ALLEGRA) 180 MG tablet  Take 1 tablet (180 mg total) by mouth as needed. Seasonal allergies from Spring to Fall 08/21/18   Tonia Ghent, MD  Flaxseed, Linseed, (FLAX SEEDS PO) Take by mouth.    [provider]  gabapentin (NEURONTIN) 100 MG capsule Take 1 capsule (100 mg total) by mouth at bedtime. DO NOT take with the hydrocodone 07/21/21   Michela Pitcher, NP  HYDROcodone-acetaminophen (NORCO/VICODIN) 5-325 MG tablet Take 1 tablet by mouth every 6 (six) hours as needed for up to 5 doses for severe pain. Patient not taking: Reported on 07/18/2021 07/16/21   Evlyn Courier, PA-C  Magnesium 250 MG TABS Take 250 mg by mouth daily.    [provider]  Melatonin 3 MG TABS Take 1 tablet (3 mg total) by mouth at bedtime as needed. 08/21/18   Tonia Ghent, MD  Multiple Vitamin (MULTIVITAMIN) capsule Take 1 capsule by mouth daily. 08/21/18   Tonia Ghent, MD  naproxen (NAPROSYN) 375 MG tablet Take 1 tablet (375 mg total) by mouth 2 (two) times daily. 07/25/21   Kommor, [place], MD  Omega-3 Fatty Acids (FISH OIL PO) Take by mouth.    [provider]  Red Yeast Rice Extract 600 MG CAPS Take 1 capsule (600 mg total) by mouth daily. 08/23/20   Tonia Ghent, MD  Turmeric 500 MG CAPS Take by mouth.    [provider]  Ubiquinol 100 MG CAPS Take 100 mg by mouth daily. 08/21/18  Tonia Ghent, MD  Zinc 50 MG CAPS Take by mouth.    [provider]      Allergies    Alendronate sodium, Codeine, Influenza vaccines, and Keflex [cephalexin]    Review of Systems   Review of Systems  Constitutional:  Negative for chills and fever.  Respiratory:  Negative for shortness of breath.   Cardiovascular:  Negative for chest pain.  Gastrointestinal:  Negative for abdominal pain, diarrhea, nausea and vomiting.  Musculoskeletal:  Positive for arthralgias (Bilateral lower extremities).  Neurological:  Negative for dizziness, weakness and numbness.  All other systems reviewed and are  negative.  Physical Exam Updated Vital Signs BP (!) 167/83    Pulse 76    Temp 98.1 F (36.7 C) (Oral)    Resp (!) 21    Ht 5\' 6"  (1.676 m)    Wt 66 kg    SpO2 96%    BMI 23.48 kg/m  Physical Exam Vitals and nursing note reviewed.  Constitutional:      General: She is not in acute distress.    Appearance: She is not ill-appearing or toxic-appearing.  HENT:     Head: Normocephalic and atraumatic.     Nose: Nose normal.     Mouth/Throat:     Mouth: Mucous membranes are moist.  Eyes:     Extraocular Movements: Extraocular movements intact.     Pupils: Pupils are equal, round, and reactive to light.  Cardiovascular:     Rate and Rhythm: Normal rate and regular rhythm.  Pulmonary:     Effort: Pulmonary effort is normal.     Breath sounds: Normal breath sounds. No wheezing.  Abdominal:     General: Abdomen is flat.     Palpations: Abdomen is soft.     Tenderness: There is no abdominal tenderness.  Musculoskeletal:     Cervical back: Normal range of motion and neck supple. No rigidity or tenderness.  Skin:    General: Skin is warm and dry.     Capillary Refill: Capillary refill takes less than 2 seconds.  Neurological:     Mental Status: She is alert.     GCS: GCS eye subscore is 4. GCS verbal subscore is 5. GCS motor subscore is 6.     Cranial Nerves: Cranial nerves 2-12 are intact. No cranial nerve deficit.     Sensory: Sensation is intact. No sensory deficit.     Motor: Motor function is intact. No weakness.     Coordination: Coordination is intact.     Comments: Patient has 5 out of 5 strength to upper and lower extremities bilaterally    ED Results / Procedures / Treatments   Labs (all labs ordered are listed, but only abnormal results are displayed) Labs Reviewed - No data to display  EKG None  Radiology CT Lumbar Spine Wo Contrast  Result Date: 07/25/2021 CLINICAL DATA:  Concern for sacral fracture EXAM: CT LUMBAR SPINE WITHOUT CONTRAST TECHNIQUE: Multidetector  CT imaging of the lumbar spine was performed without intravenous contrast administration. Multiplanar CT image reconstructions were also generated. RADIATION DOSE REDUCTION: This exam was performed according to the departmental dose-optimization program which includes automated exposure control, adjustment of the mA and/or kV according to patient size and/or use of iterative reconstruction technique. COMPARISON:  None. FINDINGS: Segmentation: 5 lumbar type vertebrae. Alignment: Trace anterolisthesis at L4-L5. Vertebrae: There is no acute lumbar spine fracture. No aggressive osseous lesion. Moderate bilateral SI joint osteoarthritis. Paraspinal and other soft tissues: Sigmoid  diverticulosis. Atherosclerotic calcifications of the aorta. Disc levels: T11-T12: No significant spinal canal or neural foraminal narrowing. T12-L1: No significant spinal canal or neural foraminal narrowing. L1-L2: Shallow left foraminal disc protrusion results in mild left neural foraminal narrowing. No spinal canal stenosis or right neural foraminal narrowing. L2-L3: Broad-based disc bulging, ligamentum flavum hypertrophy bilateral facet arthropathy. No significant spinal canal stenosis. No significant neural foraminal narrowing. There is severe interspinous process degeneration. L3-L4: Asymmetric right disc height loss with left-sided disc bulge and right-sided endplate spurring, ligamentum flavum hypertrophy and severe right-sided facet arthropathy. Mild spinal canal stenosis and mild-to-moderate right neural foraminal stenosis. No left neural foraminal stenosis. There is moderate interspinous process degeneration. L4-L5: Trace anterolisthesis with broad-based disc bulging, ligamentum flavum hypertrophy and bilateral facet arthropathy results in mild spinal canal stenosis and mild right neural foraminal stenosis. No left neural foraminal stenosis. L5-S1: Asymmetric left disc height loss with mild disc bulging and bilateral facet  arthropathy. No spinal canal stenosis. Mild left neural foraminal narrowing. No right neural foraminal narrowing. There are perineural/Tarlov cysts on the right at S1 and bilaterally at S2 with smooth expansion of the neural foramen. IMPRESSION: No acute lumbar spine fracture or evidence of sacral fracture. Multilevel degenerative changes of the lumbar spine, with mild spinal canal stenosis at L3-L4 and L4-L5. Mild-to-moderate right-sided neural foraminal stenosis at L3-L4. Mild right neural foraminal stenosis at L4-L5. Mild left neural foraminal stenosis at L5-S1. Severe interspinous process degeneration at L2-L3 and moderate at L3-L4. 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 Moderate bilateral sacroiliac joint osteoarthritis. Electronically Signed   By: Maurine Simmering M.D.   On: 07/25/2021 11:13   DG Hip Unilat W or Wo Pelvis 2-3 Views Left  Result Date: 07/25/2021 CLINICAL DATA:  Left hip pain EXAM: DG HIP (WITH OR WITHOUT PELVIS) 2-3V LEFT COMPARISON:  None. FINDINGS: No acute fracture or dislocation identified. Mild narrowing of the hip joint space with mild acetabular subchondral sclerosis. Soft tissues are unremarkable. IMPRESSION: No acute osseous abnormality identified. Electronically Signed   By: Ofilia Neas M.D.   On: 07/25/2021 10:50   DG Hip Unilat W or Wo Pelvis 2-3 Views Right  Result Date: 07/25/2021 CLINICAL DATA:  Right hip pain. EXAM: DG HIP (WITH OR WITHOUT PELVIS) 2-3V RIGHT COMPARISON:  None. FINDINGS: There is no evidence of hip fracture or dislocation. There is no evidence of arthropathy or other focal bone abnormality. IMPRESSION: No acute osseous abnormality identified. Electronically Signed   By: Ofilia Neas M.D.   On: 07/25/2021 10:46    Procedures Procedures   Medications Ordered in ED Medications  gabapentin (NEURONTIN) capsule 300 mg (has no administration in time range)  ketorolac (TORADOL) 15  MG/ML injection 15 mg (15 mg Intramuscular Given 07/26/21 1624)  HYDROcodone-acetaminophen (NORCO/VICODIN) 5-325 MG per tablet 1 tablet (1 tablet Oral Given 07/26/21 1624)  ondansetron (ZOFRAN-ODT) disintegrating tablet 4 mg (4 mg Oral Given 07/26/21 1624)    ED Course/ Medical Decision Making/ A&P                           Medical Decision Making Risk Prescription drug management.   76 year old female presents due to bilateral lower leg pain.  Patient has been seen in extensive number of times for the same complaint and continues to return to the ED for symptom management.  On examination, patient is afebrile, nontachycardic, not hypoxic, hypertensive, clear lung  sounds bilaterally, no focal deficits noted on neuro exam 5 out of 5 strength to lower extremities.  Patient denies any red flag symptoms. Patient reports that her pain begins in her gluteal cleft and then radiates down her legs to her popliteal fossa.  Patient had CT scan performed day prior which showed extensive bulging disks, foraminal narrowing/stenosis.  Patient was discharged yesterday by Dr. Matilde Sprang with prescription for naproxen, the patient states that this medication is not alleviating her symptoms.  I have provided the patient with 15 mg of Toradol intramuscularly as well as 5 mg of hydrocodone.  I have consulted the hospitalist and attempted admission for pain control, however the hospitalist has requested that I consult neurosurgery to establish a plan for this patient.  Neurosurgery consult has been placed at this time, awaiting callback.  Update: Neurosurgery returned call, recommended MRI.  Stated there was no need for immediate surgery but the patient could be an outpatient candidate for injections or surgery at a later date.  MRI orders have been placed.  Hospitalist has been consulted, Dr. Tamala Julian has agreed to see and admit the patient.  Patient is stable at time of admission. Final Clinical Impression(s) / ED  Diagnoses Final diagnoses:  Bilateral leg pain    Rx / DC Orders ED Discharge Orders     None         Lawana Chambers 07/26/21 1706    Truddie Hidden, MD 07/26/21 [phone]

## 2021-07-26 NOTE — ED Triage Notes (Addendum)
Patient reports bilateral leg pain and buttocks pain ongoing since end of last month. Patient states pain is worse when sitting up and can only get comfortable when laying back.

## 2021-07-26 NOTE — Assessment & Plan Note (Addendum)
Patient presents with complaints of pain running down both legs worse on the right especially with certain positions.  Also noted similar pains when bending neck forward.  Denies any recent trauma or injury to onset symptoms.  CT imaging of the lumbar spine had noted multilevel degenerative disease with spinal and foraminal stenosis.  Neurosurgery have been consulted and recommended obtaining MRIs of the spine.  Patient's pain seemed to be improved with Toradol and hydrocodone. -Admit to a medical telemetry bed -Hydrocodone as needed for pain -Increase gabapentin to 300 mg 3 times daily -PT to evaluate -Follow-up MRI of the spine -Rediscussed case with neurosurgery following MRI if needed

## 2021-07-26 NOTE — ED Notes (Signed)
Hospitalist at bedside 

## 2021-07-26 NOTE — Assessment & Plan Note (Deleted)
Initial blood pressures noted to be elevated up to 180.  Patient reports not being on blood pressure medications at home.

## 2021-07-26 NOTE — Assessment & Plan Note (Signed)
While in the ED patient was noted to have blood pressures elevated into the 180s.  Denies being on blood pressure medications at baseline and reports symptoms likely secondary to pain being uncontrolled. -Continue to monitor blood pressures -Hydralazine IV as needed for elevated blood pressure greater than 180

## 2021-07-26 NOTE — ED Notes (Signed)
Pt ambulated to bathroom w/ 2 staff members, pt tolerated well.

## 2021-07-27 ENCOUNTER — Observation Stay (HOSPITAL_COMMUNITY): Payer: Medicare HMO

## 2021-07-27 DIAGNOSIS — E785 Hyperlipidemia, unspecified: Secondary | ICD-10-CM | POA: Diagnosis not present

## 2021-07-27 DIAGNOSIS — M79604 Pain in right leg: Secondary | ICD-10-CM | POA: Diagnosis not present

## 2021-07-27 DIAGNOSIS — M5410 Radiculopathy, site unspecified: Secondary | ICD-10-CM | POA: Diagnosis not present

## 2021-07-27 DIAGNOSIS — N1330 Unspecified hydronephrosis: Secondary | ICD-10-CM | POA: Diagnosis not present

## 2021-07-27 DIAGNOSIS — M79605 Pain in left leg: Secondary | ICD-10-CM | POA: Diagnosis not present

## 2021-07-27 LAB — URINALYSIS, ROUTINE W REFLEX MICROSCOPIC
Bilirubin Urine: NEGATIVE
Glucose, UA: NEGATIVE mg/dL
Hgb urine dipstick: NEGATIVE
Ketones, ur: NEGATIVE mg/dL
Nitrite: NEGATIVE
Protein, ur: NEGATIVE mg/dL
Specific Gravity, Urine: >1.03 — ABNORMAL HIGH (ref 1.005–1.030)
pH: 6 (ref 5.0–8.0)

## 2021-07-27 LAB — CBC WITH DIFFERENTIAL/PLATELET
Abs Immature Granulocytes: 0.17 10*3/uL — ABNORMAL HIGH (ref 0.00–0.07)
Basophils Absolute: 0.1 10*3/uL (ref 0.0–0.1)
Basophils Relative: 1 %
Eosinophils Absolute: 0.2 10*3/uL (ref 0.0–0.5)
Eosinophils Relative: 2 %
HCT: 45.1 % (ref 36.0–46.0)
Hemoglobin: 14.9 g/dL (ref 12.0–15.0)
Immature Granulocytes: 1 %
Lymphocytes Relative: 34 %
Lymphs Abs: 4.3 10*3/uL — ABNORMAL HIGH (ref 0.7–4.0)
MCH: 29.3 pg (ref 26.0–34.0)
MCHC: 33 g/dL (ref 30.0–36.0)
MCV: 88.8 fL (ref 80.0–100.0)
Monocytes Absolute: 0.8 10*3/uL (ref 0.1–1.0)
Monocytes Relative: 6 %
Neutro Abs: 7.1 10*3/uL (ref 1.7–7.7)
Neutrophils Relative %: 56 %
Platelets: 288 10*3/uL (ref 150–400)
RBC: 5.08 MIL/uL (ref 3.87–5.11)
RDW: 12.8 % (ref 11.5–15.5)
WBC: 12.7 10*3/uL — ABNORMAL HIGH (ref 4.0–10.5)
nRBC: 0.2 % (ref 0.0–0.2)

## 2021-07-27 LAB — URINALYSIS, MICROSCOPIC (REFLEX)

## 2021-07-27 LAB — SEDIMENTATION RATE: Sed Rate: 5 mm/hr (ref 0–22)

## 2021-07-27 LAB — C-REACTIVE PROTEIN: CRP: 0.5 mg/dL (ref <1.0)

## 2021-07-27 IMAGING — US US RENAL
1 series · 14 of 25 positions shown · non-contrast
Comparison: None.

CLINICAL DATA: Urinary retention.

EXAM:
RENAL / URINARY TRACT ULTRASOUND COMPLETE

[Series 1: us renal · 14 of 50 slices shown]
[im 1/50]
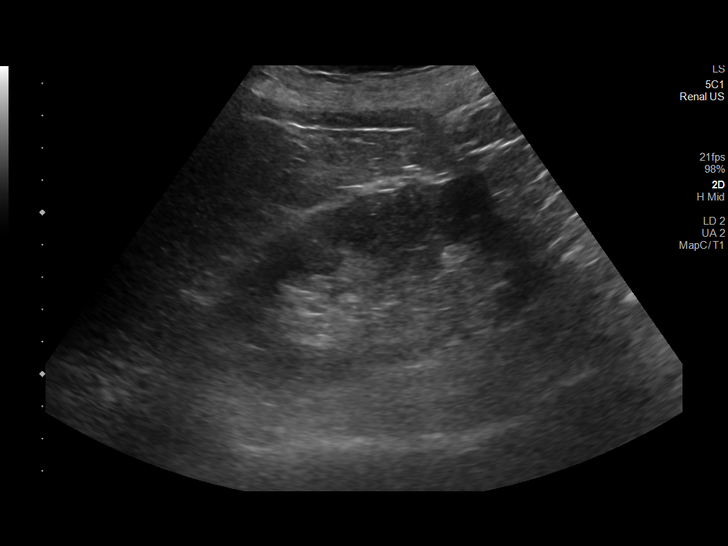
[im 5/50]
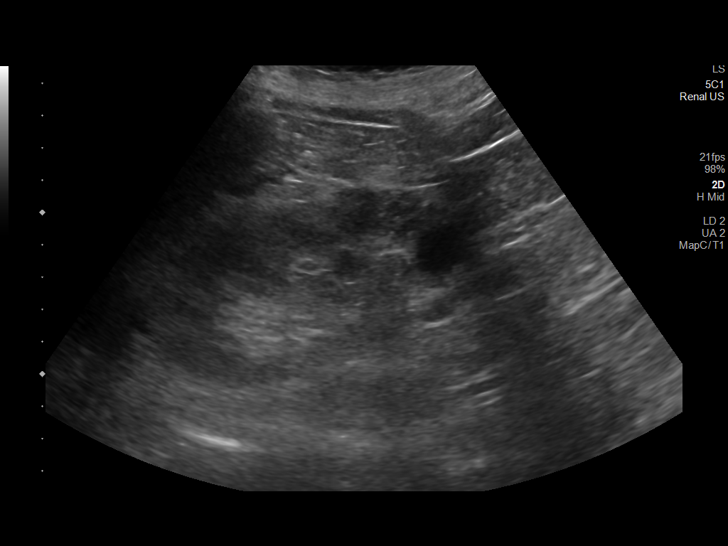
[im 9/50]
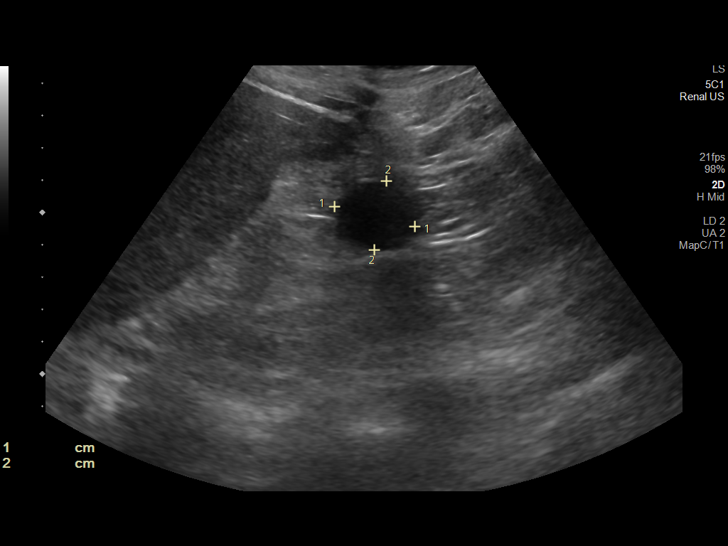
[im 13/50]
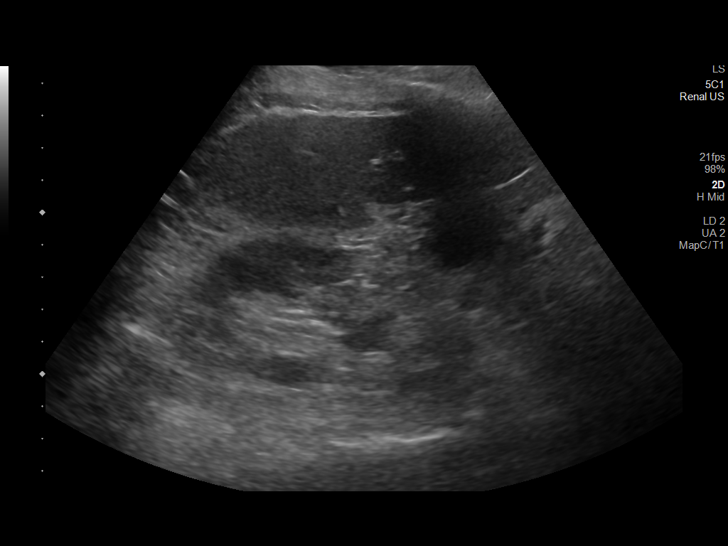
[im 17/50]
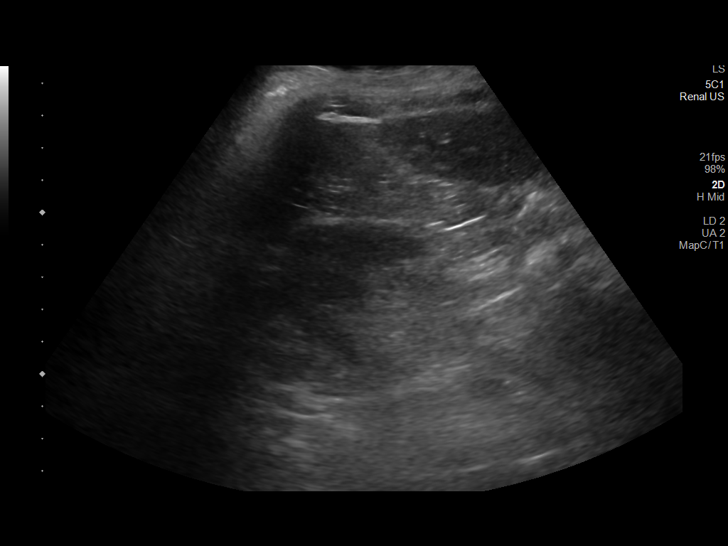
[im 19/50]
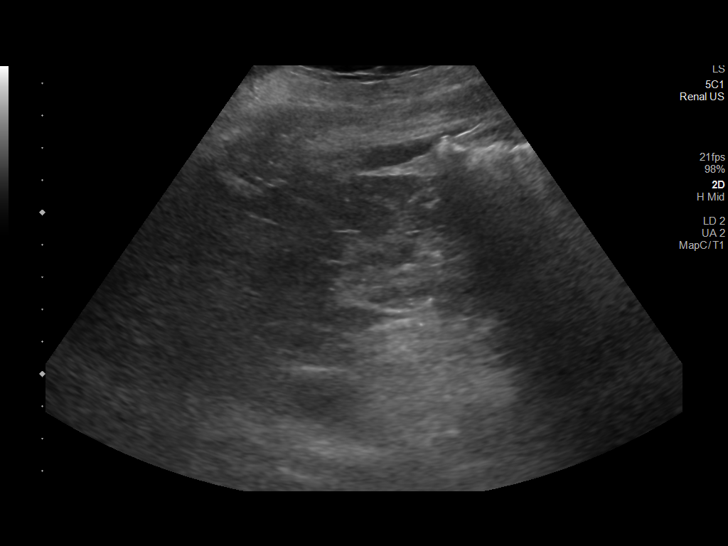
[im 23/50]
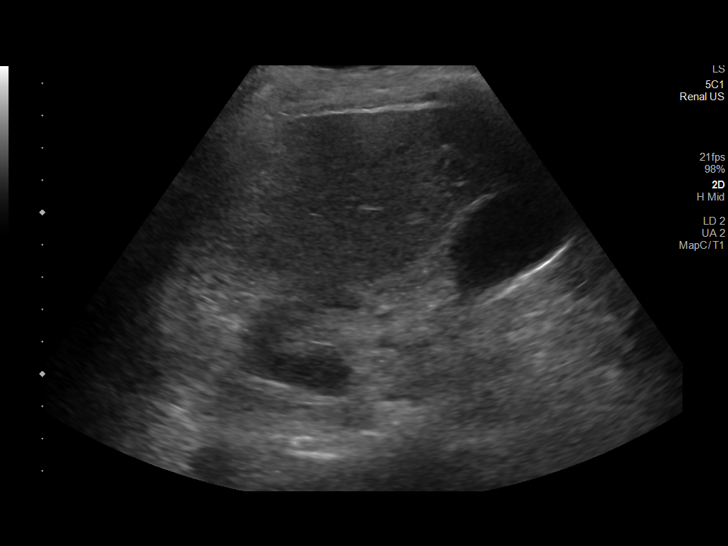
[im 27/50]
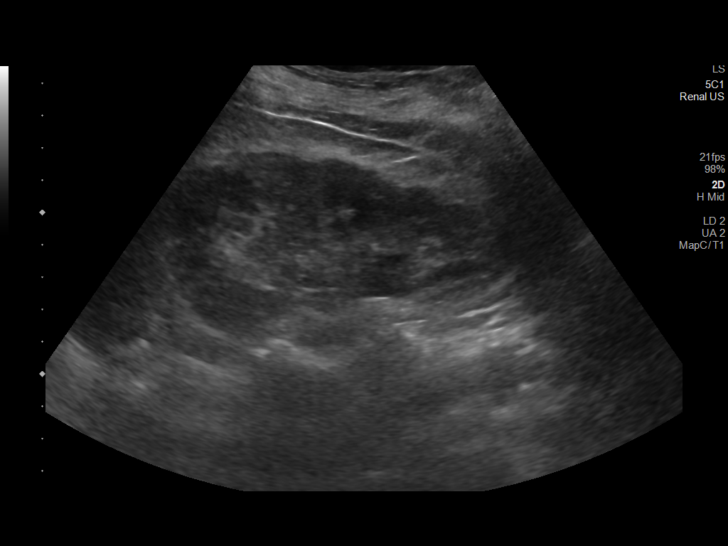
[im 31/50]
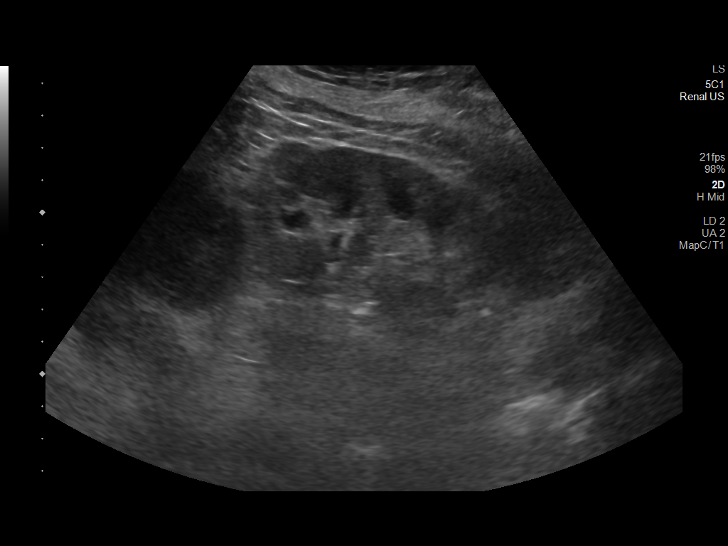
[im 33/50]
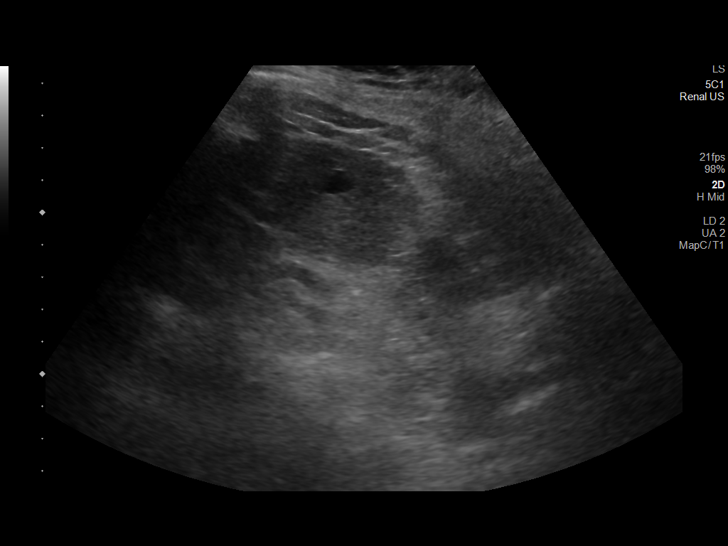
[im 37/50]
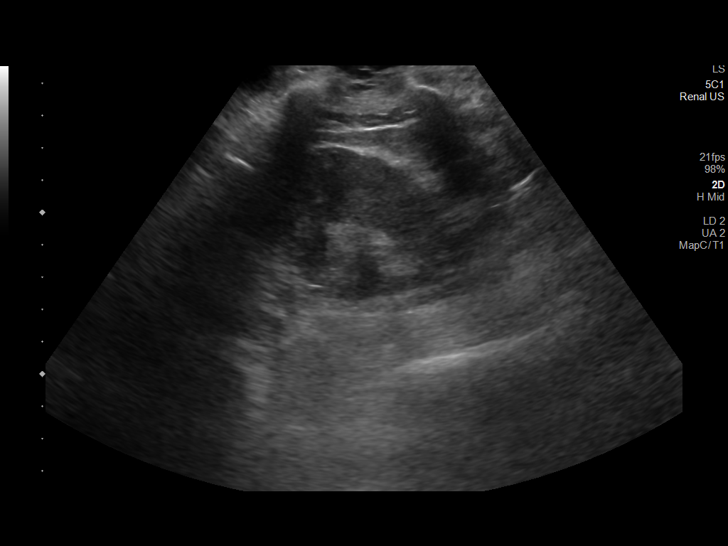
[im 41/50]
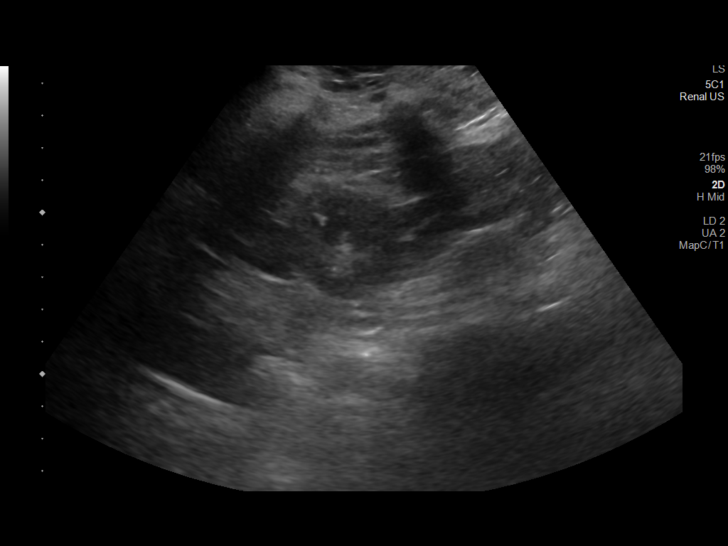
[im 45/50]
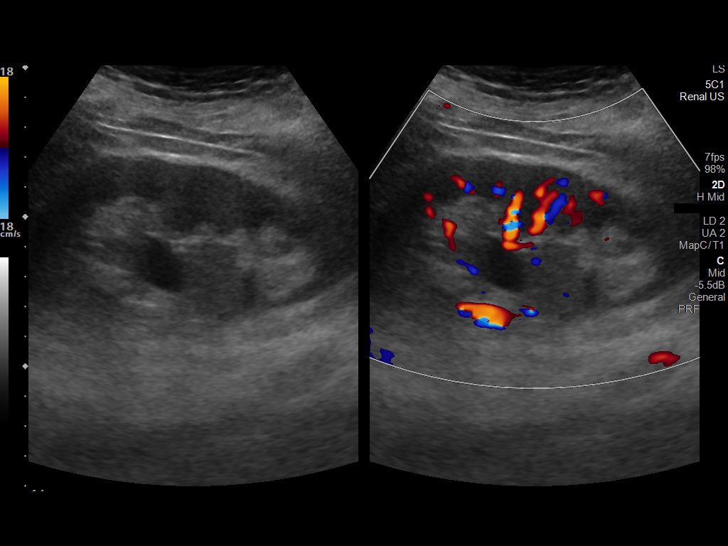
[im 50/50]
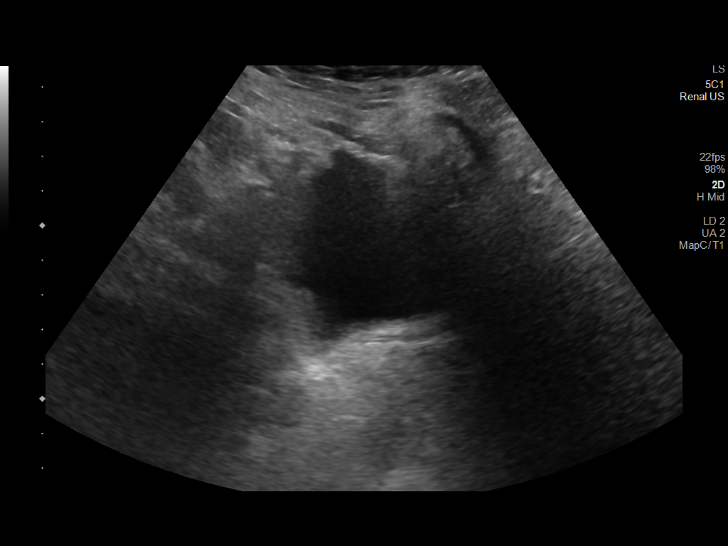

[14 of 25 positions shown; findings below may reference images not displayed]

FINDINGS: Right Kidney:

Renal measurements: 9.9 x 4.9 x 4.3 cm = volume: 107 mL.
Echogenicity within normal limits. Single lower pole cyst measures
2.7 cm. No suspicious mass or hydronephrosis.

Left Kidney:

Renal measurements: 10.1 x 5 x 4.1 cm = volume: 107 mL. Echogenicity
within normal limits. Mild hydronephrosis.

Bladder:

Appears normal for degree of bladder distention.

Other:

None.
IMPRESSION: Mild LEFT-sided hydronephrosis.

## 2021-07-27 MED ORDER — METHYLPREDNISOLONE 4 MG PO TBPK
8.0000 mg | ORAL_TABLET | Freq: Every morning | ORAL | Status: AC
  Administered 2021-07-27: 8 mg via ORAL
  Filled 2021-07-27: qty 21

## 2021-07-27 MED ORDER — METHYLPREDNISOLONE 4 MG PO TBPK
8.0000 mg | ORAL_TABLET | Freq: Every evening | ORAL | Status: AC
  Administered 2021-07-27: 8 mg via ORAL

## 2021-07-27 MED ORDER — HYDROCODONE-ACETAMINOPHEN 5-325 MG PO TABS
1.0000 | ORAL_TABLET | Freq: Four times a day (QID) | ORAL | Status: DC | PRN
  Administered 2021-07-27 – 2021-07-28 (×3): 1 via ORAL
  Filled 2021-07-27 (×4): qty 1

## 2021-07-27 MED ORDER — METHYLPREDNISOLONE 4 MG PO TBPK
4.0000 mg | ORAL_TABLET | ORAL | Status: AC
  Administered 2021-07-27: 4 mg via ORAL

## 2021-07-27 MED ORDER — METHYLPREDNISOLONE 4 MG PO TBPK
4.0000 mg | ORAL_TABLET | Freq: Four times a day (QID) | ORAL | Status: DC
  Administered 2021-07-27: 4 mg via ORAL

## 2021-07-27 MED ORDER — POLYETHYLENE GLYCOL 3350 17 G PO PACK
17.0000 g | PACK | Freq: Once | ORAL | Status: AC
  Administered 2021-07-27: 17 g via ORAL
  Filled 2021-07-27: qty 1

## 2021-07-27 MED ORDER — KETOROLAC TROMETHAMINE 15 MG/ML IJ SOLN
15.0000 mg | Freq: Once | INTRAMUSCULAR | Status: AC
  Administered 2021-07-27: 15 mg via INTRAVENOUS
  Filled 2021-07-27: qty 1

## 2021-07-27 MED ORDER — METHYLPREDNISOLONE 4 MG PO TBPK: 8.0000 mg | ORAL_TABLET | Freq: Every evening | ORAL | Status: DC

## 2021-07-27 MED ORDER — METHYLPREDNISOLONE 4 MG PO TBPK
4.0000 mg | ORAL_TABLET | Freq: Three times a day (TID) | ORAL | Status: DC
  Administered 2021-07-27 – 2021-07-28 (×2): 4 mg via ORAL

## 2021-07-27 NOTE — Care Management Obs Status (Signed)
[place] NOTIFICATION   Patient Details  Name: Desiree Ortega MRN: [id number] Date of Birth: [birth year]   Medicare Observation Status Notification Given:  Yes    Carles Collet, RN 07/27/2021, 4:35 PM

## 2021-07-27 NOTE — Plan of Care (Signed)

## 2021-07-27 NOTE — Progress Notes (Signed)
PROGRESS NOTE  Desiree Ortega BJY:[id number] DOB: April 11, 1946 DOA: 07/26/2021 PCP: Tonia Ghent, MD  HPI/Recap of past 24 hours: Desiree Ortega is a 76 y.o. female with medical history significant of HLD, nephrolithiasis, and skin cancer presents with complaints of bilateral lower extremity pain over the last 12 days. Patient reported persisted pain in her hips that radiates down the back of her leg to her knee and is worse on the right than on the left. Pt also reported bending her neck forward produces a sharp shooting pain running down her neck. Sitting makes symptoms worse. Denies any recent falls, trauma, or heavy lifting to onset symptoms, denies any saddle anesthesia. Reports difficulty urinating. CT scan of the lumbar spine which showed degenerative changes with foraminal stenosis multilevel degenerative changes with stenosis present. In the ED patient was noted to be hypertensive up to 183/87. Labs fairly stable except for mild leukocytosis. Neurosurgery consulted. Patient admitted for further management.     Today, pt still c/o similar symptoms as mentioned above, but does report pain meds is giving her some relief. Having difficulty urinating. Denies any other new complaints.    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  No other acute abnormality seen Neurosurgery consulted, tarlov cysts are benign findings, no neurosurgical interventions.  Recommend oral steroid Dosepak and Neurontin, PT and follow-up in a couple  of weeks Pain management  Elevated blood pressure reading- (present on admission) In the ED, SBP noted to be in 180s Not on any BP meds at home, likely worsened by pain Hydralazine IV as needed for elevated blood pressure greater than 180   Leukocytosis- (present on admission) Currently afebrile Possibly reactive, but will check UA given difficulty urinating UA unremarkable for infection Daily CBC  Urinary retention Unknown etiology, discussed with neurosurgery on related to MRI findings/patient's complaints Difficulty urinating, In-N-Out cath produced about 400 cc of urine Renal ultrasound pending Bladder scan every shift, in and out cath as needed Monitor closely  HLD (hyperlipidemia)- (present on admission) Last available lipid panel from 08/19/2020, with LDL of 174 Not on any meds Outpatient follow up      Estimated body mass index is 23.48 kg/m as calculated from the following:   Height as of this encounter: 5\' 6"  (1.676 m).   Weight as of this encounter: 66 kg.     Code Status: Full  Family Communication: None at bedside  Disposition Plan: Status is: Observation The patient will require care spanning > 2 midnights and should be moved to inpatient because: Level of care    Consultants: Neurosurgery  Procedures: None  Antimicrobials: None  DVT prophylaxis: Lovenox   Objective: Vitals:   07/26/21 2353 07/27/21 0040 07/27/21 0538 07/27/21 0748  BP: 137/69 (!) 152/67 126/74 (!) 145/77  Pulse: 77 80 75 82  Resp: 17 18 18 16   Temp: 98 F (36.7 C) 97.6 F (36.4 C) 98.5 F (36.9 C) 98.1 F (36.7 C)  TempSrc:  Oral Oral Oral  SpO2: 97% 99% 95% 98%  Weight:      Height:  No intake or output data in the 24 hours ending 07/27/21 1101 Filed Weights   07/26/21 1413  Weight: 66 kg    Exam: General: NAD  Cardiovascular: S1, S2 present Respiratory: CTAB Abdomen: Soft, nontender, nondistended, bowel sounds present Musculoskeletal: No  bilateral pedal edema noted Skin: Normal Psychiatry: Normal mood  Neurology: No obvious focal neurologic deficits, strength equal in all extremities, sensation intact    Data Reviewed: CBC: Recent Labs  Lab 07/26/21 1812 07/27/21 0058  WBC 13.3* 12.7*  NEUTROABS 8.1* 7.1  HGB 15.4* 14.9  HCT 45.9 45.1  MCV 88.3 88.8  PLT 320 242   Basic Metabolic Panel: Recent Labs  Lab 07/26/21 1812  NA 135  K 4.1  CL 99  CO2 27  GLUCOSE 104*  BUN 19  CREATININE 0.72  CALCIUM 9.0   GFR: Estimated Creatinine Clearance: 55.1 mL/min (by C-G formula based on SCr of 0.72 mg/dL). Liver Function Tests: No results for input(s): AST, ALT, ALKPHOS, BILITOT, PROT, ALBUMIN in the last 168 hours. No results for input(s): LIPASE, AMYLASE in the last 168 hours. No results for input(s): AMMONIA in the last 168 hours. Coagulation Profile: No results for input(s): INR, PROTIME in the last 168 hours. Cardiac Enzymes: No results for input(s): CKTOTAL, CKMB, CKMBINDEX, TROPONINI in the last 168 hours. BNP (last 3 results) No results for input(s): PROBNP in the last 8760 hours. HbA1C: No results for input(s): HGBA1C in the last 72 hours. CBG: No results for input(s): GLUCAP in the last 168 hours. Lipid Profile: No results for input(s): CHOL, HDL, LDLCALC, TRIG, CHOLHDL, LDLDIRECT in the last 72 hours. Thyroid Function Tests: No results for input(s): TSH, T4TOTAL, FREET4, T3FREE, THYROIDAB in the last 72 hours. Anemia Panel: No results for input(s): VITAMINB12, FOLATE, FERRITIN, TIBC, IRON, RETICCTPCT in the last 72 hours. Urine analysis:    Component Value Date/Time   COLORURINE YELLOW 07/16/2021 0036   APPEARANCEUR CLEAR 07/16/2021 0036   APPEARANCEUR Cloudy (A) 02/06/2019 0850   LABSPEC 1.016 07/16/2021 0036   PHURINE 6.0 07/16/2021 0036   GLUCOSEU NEGATIVE 07/16/2021 0036   HGBUR NEGATIVE 07/16/2021 0036   BILIRUBINUR NEGATIVE 07/16/2021 0036   BILIRUBINUR Negative 02/06/2019 0850    KETONESUR 5 (A) 07/16/2021 0036   PROTEINUR NEGATIVE 07/16/2021 0036   NITRITE NEGATIVE 07/16/2021 0036   LEUKOCYTESUR TRACE (A) 07/16/2021 0036   Sepsis Labs: @LABRCNTIP (procalcitonin:4,lacticidven:4)  ) Recent Results (from the past 240 hour(s))  Resp Panel by RT-PCR (Flu A&B, Covid) Nasopharyngeal Swab     Status: None   Collection Time: 07/26/21  6:06 PM   Specimen: Nasopharyngeal Swab; Nasopharyngeal(NP) swabs in vial transport medium  Result Value Ref Range Status   SARS Coronavirus 2 by RT PCR NEGATIVE NEGATIVE Final    Comment: (NOTE) SARS-CoV-2 target nucleic acids are NOT DETECTED.  The SARS-CoV-2 RNA is generally detectable in upper respiratory specimens during the acute phase of infection. The lowest concentration of SARS-CoV-2 viral copies this assay can detect is 138 copies/mL. A negative result does not preclude SARS-Cov-2 infection and should not be used as the sole basis for treatment or other patient management decisions. A negative result may occur with  improper specimen collection/handling, submission of specimen other than nasopharyngeal swab, presence of viral mutation(s) within the areas targeted by this assay, and inadequate number of viral copies(<138 copies/mL). A negative result must be combined with clinical observations, patient history, and epidemiological information. The expected result is Negative.  Fact Sheet for Patients:  EntrepreneurPulse.com.au  Fact Sheet for  Healthcare Providers:  IncredibleEmployment.be  This test is no t yet approved or cleared by the [place] and  has been authorized for detection and/or diagnosis of SARS-CoV-2 by FDA under an Emergency Use Authorization (EUA). This EUA will remain  in effect (meaning this test can be used) for the duration of the COVID-19 declaration under Section 564(b)(1) of the Act, 21 U.S.C.section 360bbb-3(b)(1), unless the authorization is  terminated  or revoked sooner.       Influenza A by PCR NEGATIVE NEGATIVE Final   Influenza B by PCR NEGATIVE NEGATIVE Final    Comment: (NOTE) The Xpert Xpress SARS-CoV-2/FLU/RSV plus assay is intended as an aid in the diagnosis of influenza from Nasopharyngeal swab specimens and should not be used as a sole basis for treatment. Nasal washings and aspirates are unacceptable for Xpert Xpress SARS-CoV-2/FLU/RSV testing.  Fact Sheet for Patients: EntrepreneurPulse.com.au  Fact Sheet for Healthcare Providers: IncredibleEmployment.be  This test is not yet approved or cleared by the [place] FDA and has been authorized for detection and/or diagnosis of SARS-CoV-2 by FDA under an Emergency Use Authorization (EUA). This EUA will remain in effect (meaning this test can be used) for the duration of the COVID-19 declaration under Section 564(b)(1) of the Act, 21 U.S.C. section 360bbb-3(b)(1), unless the authorization is terminated or revoked.  Performed at [facility], [place] [address], [place], [place] [address]       Studies: MR CERVICAL SPINE WO CONTRAST  Result Date: 07/26/2021 CLINICAL DATA:  Neck, mid back, and low back pain, with bilateral leg and buttock pain EXAM: MRI CERVICAL, THORACIC AND LUMBAR SPINE WITHOUT CONTRAST TECHNIQUE: Multiplanar and multiecho pulse sequences of the cervical spine, to include the craniocervical junction and cervicothoracic junction, and thoracic and lumbar spine, were obtained without intravenous contrast. COMPARISON:  No prior MRI of the cervical, thoracic, or lumbar spine. Correlation is made with CT lumbar spine 07/25/2021 FINDINGS: MRI CERVICAL SPINE FINDINGS Alignment: Physiologic. Vertebrae: No acute fracture or suspicious osseous lesion. Cord: Normal signal and morphology. Posterior Fossa, vertebral arteries, paraspinal tissues: Negative. Disc levels: C2-C3: No significant disc bulge. No spinal canal  stenosis or neuroforaminal narrowing. C3-C4: No significant disc bulge. No spinal canal stenosis or neuroforaminal narrowing. C4-C5: No significant disc bulge. No spinal canal stenosis or neuroforaminal narrowing. C5-C6: No significant disc bulge. No spinal canal stenosis or neuroforaminal narrowing. C6-C7: Disc height loss with mild disc bulge, which indents the ventral spinal cord. Uncovertebral and facet arthropathy. No spinal canal stenosis. Mild bilateral neural foraminal narrowing. C7-T1: No significant disc bulge. No spinal canal stenosis or neuroforaminal narrowing. MRI THORACIC SPINE FINDINGS Alignment: Levocurvature of the upper thoracic spine, dextrocurvature of the mid to lower thoracic spine. No significant antero or retrolisthesis. Vertebrae: No acute fracture or suspicious osseous lesion. Cord:  Normal signal and morphology. Paraspinal and other soft tissues: Negative. Disc levels: No spinal canal stenosis or neural foraminal narrowing. MRI LUMBAR SPINE FINDINGS Segmentation: 5 lumbar-type vertebral bodies. Partial sacralization of L5 with left L5-S1 pseudoarticulation of a broadened L5 transverse process with the sacrum (series 5, image 17). Alignment: Levocurvature of the lumbar spine. 3 mm anterolisthesis L4 on L5. Vertebrae: No acute fracture or suspicious osseous lesion. Endplate degenerative changes at L3-L4. 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 In the most inferior left Tarlov  cyst, there is a fluid fluid level with T1 hypointense and T2 hyperintense material above T1 hypointense and T2 hypointense material, most likely CSF on an additional component, possibly hemorrhage. Conus medullaris and cauda equina: Conus extends to the L1-L2 level. Conus and cauda equina appear normal. Paraspinal and other soft tissues: Bilateral  renal cysts. Diverticulosis Disc levels: T12-L1: No significant disc bulge. No spinal canal stenosis or neural foraminal narrowing. L1-L2: Mild disc desiccation and small central disc protrusion. No spinal canal stenosis or neural foraminal narrowing. L2-L3: Mild disc bulge. Mild facet arthropathy. No spinal canal stenosis or neural foraminal narrowing. L3-L4: Mild disc bulge. Mild facet arthropathy. No spinal canal stenosis or neural foraminal narrowing. L4-L5: Trace anterolisthesis with disc unroofing. Mild disc bulge. Severe facet arthropathy. No spinal canal stenosis or neural foraminal narrowing. L5-S1: Mild disc bulge. Mild facet arthropathy. No spinal canal stenosis or neural foraminal narrowing. IMPRESSION: 1. Tarlov cysts in the sacrum, with fluid-fluid levels, with the dependent portion T1 and T2 hypointense, possibly hemorrhage. No acute abnormality is seen in the remainder of the spinal canal as a source for the hemorrhage, suggesting a cerebral source. 2. Multifocal curvature of the thoracolumbar spine, without significant spinal canal stenosis or neural foraminal narrowing. 3. C6-C7 mild bilateral neural foraminal narrowing. No spinal canal stenosis or other neural foraminal narrowing in the cervical spine. 4. No acute fracture or suspicious lesion. These results were called by telephone at the time of interpretation on 07/26/2021 at 10:28 pm to provider [facility] , who verbally acknowledged these results. Electronically Signed   By: Merilyn Baba M.D.   On: 07/26/2021 22:28   MR THORACIC SPINE WO CONTRAST  Result Date: 07/26/2021 CLINICAL DATA:  Neck, mid back, and low back pain, with bilateral leg and buttock pain EXAM: MRI CERVICAL, THORACIC AND LUMBAR SPINE WITHOUT CONTRAST TECHNIQUE: Multiplanar and multiecho pulse sequences of the cervical spine, to include the craniocervical junction and cervicothoracic junction, and thoracic and lumbar spine, were obtained without intravenous contrast.  COMPARISON:  No prior MRI of the cervical, thoracic, or lumbar spine. Correlation is made with CT lumbar spine 07/25/2021 FINDINGS: MRI CERVICAL SPINE FINDINGS Alignment: Physiologic. Vertebrae: No acute fracture or suspicious osseous lesion. Cord: Normal signal and morphology. Posterior Fossa, vertebral arteries, paraspinal tissues: Negative. Disc levels: C2-C3: No significant disc bulge. No spinal canal stenosis or neuroforaminal narrowing. C3-C4: No significant disc bulge. No spinal canal stenosis or neuroforaminal narrowing. C4-C5: No significant disc bulge. No spinal canal stenosis or neuroforaminal narrowing. C5-C6: No significant disc bulge. No spinal canal stenosis or neuroforaminal narrowing. C6-C7: Disc height loss with mild disc bulge, which indents the ventral spinal cord. Uncovertebral and facet arthropathy. No spinal canal stenosis. Mild bilateral neural foraminal narrowing. C7-T1: No significant disc bulge. No spinal canal stenosis or neuroforaminal narrowing. MRI THORACIC SPINE FINDINGS Alignment: Levocurvature of the upper thoracic spine, dextrocurvature of the mid to lower thoracic spine. No significant antero or retrolisthesis. Vertebrae: No acute fracture or suspicious osseous lesion. Cord:  Normal signal and morphology. Paraspinal and other soft tissues: Negative. Disc levels: No spinal canal stenosis or neural foraminal narrowing. MRI LUMBAR SPINE FINDINGS Segmentation: 5 lumbar-type vertebral bodies. Partial sacralization of L5 with left L5-S1 pseudoarticulation of a broadened L5 transverse process with the sacrum (series 5, image 17). Alignment: Levocurvature of the lumbar spine. 3 mm anterolisthesis L4 on L5. Vertebrae: No acute fracture or suspicious osseous lesion. Endplate degenerative changes at L3-L4. Multiple Tarlov cysts in the sacrum, several of which have fluid-fluid  levels (series 8, image 31 and series 8, image 34) or signal that is not consistent with appear CSF (on the right in  series 8, image 34). In the most inferior left Tarlov cyst, there is a fluid fluid level with T1 hypointense and T2 hyperintense material above T1 hypointense and T2 hypointense material, most likely CSF on an additional component, possibly hemorrhage. Conus medullaris and cauda equina: Conus extends to the L1-L2 level. Conus and cauda equina appear normal. Paraspinal and other soft tissues: Bilateral renal cysts. Diverticulosis Disc levels: T12-L1: No significant disc bulge. No spinal canal stenosis or neural foraminal narrowing. L1-L2: Mild disc desiccation and small central disc protrusion. No spinal canal stenosis or neural foraminal narrowing. L2-L3: Mild disc bulge. Mild facet arthropathy. No spinal canal stenosis or neural foraminal narrowing. L3-L4: Mild disc bulge. Mild facet arthropathy. No spinal canal stenosis or neural foraminal narrowing. L4-L5: Trace anterolisthesis with disc unroofing. Mild disc bulge. Severe facet arthropathy. No spinal canal stenosis or neural foraminal narrowing. L5-S1: Mild disc bulge. Mild facet arthropathy. No spinal canal stenosis or neural foraminal narrowing. IMPRESSION: 1. Tarlov cysts in the sacrum, with fluid-fluid levels, with the dependent portion T1 and T2 hypointense, possibly hemorrhage. No acute abnormality is seen in the remainder of the spinal canal as a source for the hemorrhage, suggesting a cerebral source. 2. Multifocal curvature of the thoracolumbar spine, without significant spinal canal stenosis or neural foraminal narrowing. 3. C6-C7 mild bilateral neural foraminal narrowing. No spinal canal stenosis or other neural foraminal narrowing in the cervical spine. 4. No acute fracture or suspicious lesion. These results were called by telephone at the time of interpretation on 07/26/2021 at 10:28 pm to provider [facility] , who verbally acknowledged these results. Electronically Signed   By: Merilyn Baba M.D.   On: 07/26/2021 22:28   MR LUMBAR SPINE WO  CONTRAST  Result Date: 07/26/2021 CLINICAL DATA:  Neck, mid back, and low back pain, with bilateral leg and buttock pain EXAM: MRI CERVICAL, THORACIC AND LUMBAR SPINE WITHOUT CONTRAST TECHNIQUE: Multiplanar and multiecho pulse sequences of the cervical spine, to include the craniocervical junction and cervicothoracic junction, and thoracic and lumbar spine, were obtained without intravenous contrast. COMPARISON:  No prior MRI of the cervical, thoracic, or lumbar spine. Correlation is made with CT lumbar spine 07/25/2021 FINDINGS: MRI CERVICAL SPINE FINDINGS Alignment: Physiologic. Vertebrae: No acute fracture or suspicious osseous lesion. Cord: Normal signal and morphology. Posterior Fossa, vertebral arteries, paraspinal tissues: Negative. Disc levels: C2-C3: No significant disc bulge. No spinal canal stenosis or neuroforaminal narrowing. C3-C4: No significant disc bulge. No spinal canal stenosis or neuroforaminal narrowing. C4-C5: No significant disc bulge. No spinal canal stenosis or neuroforaminal narrowing. C5-C6: No significant disc bulge. No spinal canal stenosis or neuroforaminal narrowing. C6-C7: Disc height loss with mild disc bulge, which indents the ventral spinal cord. Uncovertebral and facet arthropathy. No spinal canal stenosis. Mild bilateral neural foraminal narrowing. C7-T1: No significant disc bulge. No spinal canal stenosis or neuroforaminal narrowing. MRI THORACIC SPINE FINDINGS Alignment: Levocurvature of the upper thoracic spine, dextrocurvature of the mid to lower thoracic spine. No significant antero or retrolisthesis. Vertebrae: No acute fracture or suspicious osseous lesion. Cord:  Normal signal and morphology. Paraspinal and other soft tissues: Negative. Disc levels: No spinal canal stenosis or neural foraminal narrowing. MRI LUMBAR SPINE FINDINGS Segmentation: 5 lumbar-type vertebral bodies. Partial sacralization of L5 with left L5-S1 pseudoarticulation of a broadened L5 transverse  process with the sacrum (series 5, image 17). Alignment: Levocurvature  of the lumbar spine. 3 mm anterolisthesis L4 on L5. Vertebrae: No acute fracture or suspicious osseous lesion. Endplate degenerative changes at L3-L4. 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 In the most inferior left Tarlov cyst, there is a fluid fluid level with T1 hypointense and T2 hyperintense material above T1 hypointense and T2 hypointense material, most likely CSF on an additional component, possibly hemorrhage. Conus medullaris and cauda equina: Conus extends to the L1-L2 level. Conus and cauda equina appear normal. Paraspinal and other soft tissues: Bilateral renal cysts. Diverticulosis Disc levels: T12-L1: No significant disc bulge. No spinal canal stenosis or neural foraminal narrowing. L1-L2: Mild disc desiccation and small central disc protrusion. No spinal canal stenosis or neural foraminal narrowing. L2-L3: Mild disc bulge. Mild facet arthropathy. No spinal canal stenosis or neural foraminal narrowing. L3-L4: Mild disc bulge. Mild facet arthropathy. No spinal canal stenosis or neural foraminal narrowing. L4-L5: Trace anterolisthesis with disc unroofing. Mild disc bulge. Severe facet arthropathy. No spinal canal stenosis or neural foraminal narrowing. L5-S1: Mild disc bulge. Mild facet arthropathy. No spinal canal stenosis or neural foraminal narrowing. IMPRESSION: 1. Tarlov cysts in the sacrum, with fluid-fluid levels, with the dependent portion T1 and T2 hypointense, possibly hemorrhage. No acute abnormality is seen in the remainder of the spinal canal as a source for the hemorrhage, suggesting a cerebral source. 2. Multifocal curvature of the thoracolumbar spine, without significant spinal canal stenosis or neural foraminal narrowing. 3. C6-C7 mild bilateral  neural foraminal narrowing. No spinal canal stenosis or other neural foraminal narrowing in the cervical spine. 4. No acute fracture or suspicious lesion. These results were called by telephone at the time of interpretation on 07/26/2021 at 10:28 pm to provider [facility] , who verbally acknowledged these results. Electronically Signed   By: Merilyn Baba M.D.   On: 07/26/2021 22:28    Scheduled Meds:  enoxaparin (LOVENOX) injection  40 mg Subcutaneous Q24H   gabapentin  300 mg Oral TID   sodium chloride flush  3 mL Intravenous Q12H    Continuous Infusions:   LOS: 0 days     Alma Friendly, MD [facility]  If 7PM-7AM, please contact night-coverage [email or web address] 07/27/2021, 11:01 AM

## 2021-07-27 NOTE — Evaluation (Signed)
Physical Therapy Evaluation Patient Details Name: Desiree Ortega MRN: [id number] DOB: [birth year] Today's Date: 07/27/2021  History of Present Illness  76 y.o. female presents 07/26/21 with complaints of bilateral lower extremity pain over the last 12 days. 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 Prior to onset of pain, patient was independent with all mobility and very physically active (walking, yoga, swimming). She lives with her spouse who is s/p stroke and walks modified independently with rollator. She has four local daughters who have been assisting as needed.  Pt currently requires minguard assist with cues to walk 200 ft with RW. She can benefit from additional acute therapy for education on safe use of DME.  Anticipate patient will benefit from PT to address problems listed below.Will continue to follow acutely to maximize functional mobility independence and safety.          Recommendations for follow up therapy are one component of a multi-disciplinary discharge planning process, led by the attending physician.  Recommendations may be updated based on patient status, additional functional criteria and insurance authorization.  Follow Up Recommendations Outpatient PT    Assistance Recommended at Discharge PRN  Patient can return home with the following  Assistance with cooking/housework    Equipment Recommendations Rolling walker (2 wheels)  Recommendations for Other Services  OT consult    Functional Status Assessment Patient has had a recent decline in their functional status and demonstrates the ability to make significant improvements in function in a reasonable and predictable amount of time.     Precautions / Restrictions Precautions Precautions: Fall Precaution Comments: denies  falls Restrictions Weight Bearing Restrictions: No      Mobility  Bed Mobility Overal bed mobility: Needs Assistance Bed Mobility: Rolling, Sidelying to Sit, Sit to Sidelying Rolling: Supervision Sidelying to sit: Supervision     Sit to sidelying: Supervision General bed mobility comments: required cues for technique; reported moving thru sidelying did reduce pain compared to how she had been moving    Transfers Overall transfer level: Needs assistance Equipment used: Rolling walker (2 wheels) Transfers: Sit to/from Stand Sit to Stand: Min guard           General transfer comment: vc for safe, correct hand placement; near imbalance when sharp pain hit her, however no external support needed (corrected herself with RW)    Ambulation/Gait Ambulation/Gait assistance: Min guard Gait Distance (Feet): 200 Feet Assistive device: Rolling walker (2 wheels) Gait Pattern/deviations: Step-through pattern, Decreased stride length   Gait velocity interpretation: 1.31 - 2.62 ft/sec, indicative of limited community ambulator   General Gait Details: vc for walker proximity (repeated several times during gait) with cues for posture  Stairs            Wheelchair Mobility    Modified Rankin (Stroke Patients Only)       Balance Overall balance assessment: Needs assistance Sitting-balance support: No upper extremity supported, Feet supported Sitting balance-Leahy Scale: Good     Standing balance support: No upper extremity supported Standing balance-Leahy Scale: Fair                               Pertinent Vitals/Pain Pain Assessment Pain Assessment: 0-10 Pain Score: 5  Pain Location: backs of thighs Pain Descriptors / Indicators: Aching, Sharp Pain Intervention(s): Limited activity within  patient's tolerance, Monitored during session, Premedicated before session, Repositioned    Home Living Family/patient expects to be discharged to:: Private  residence Living Arrangements: Spouse/significant other Available Help at Discharge: Family (husband has had stroke; uses RW inside home; he drives; tires quickly (stores very difficult for him); 4 daughters local and have been assisting) Type of Home: House Home Access: Stairs to enter   Technical brewer of Steps: 1   Home Layout: Two level;Able to live on main level with bedroom/bathroom Home Equipment: Shower seat;Grab bars - tub/shower Additional Comments: normally very active; walks 5 mi, does yoga; swims laps    Prior Function Prior Level of Function : Independent/Modified Independent;Driving                     Hand Dominance        Extremity/Trunk Assessment   Upper Extremity Assessment Upper Extremity Assessment: Overall WFL for tasks assessed    Lower Extremity Assessment Lower Extremity Assessment: Overall WFL for tasks assessed    Cervical / Trunk Assessment Cervical / Trunk Assessment: Normal  Communication   Communication: No difficulties  Cognition Arousal/Alertness: Awake/alert Behavior During Therapy: WFL for tasks assessed/performed Overall Cognitive Status: Within Functional Limits for tasks assessed                                          General Comments      Exercises     Assessment/Plan    PT Assessment Patient needs continued PT services  PT Problem List Decreased activity tolerance;Decreased mobility;Decreased knowledge of use of DME;Decreased knowledge of precautions;Pain       PT Treatment Interventions DME instruction;Gait training;Functional mobility training;Therapeutic activities;Therapeutic exercise;Patient/family education    PT Goals (Current goals can be found in the Care Plan section)  Acute Rehab PT Goals Patient Stated Goal: pain control PT Goal Formulation: With patient Time For Goal Achievement: 08/10/21 Potential to Achieve Goals: Good    Frequency Min 5X/week     Co-evaluation                AM-PAC PT "6 Clicks" Mobility  Outcome Measure Help needed turning from your back to your side while in a flat bed without using bedrails?: A Little Help needed moving from lying on your back to sitting on the side of a flat bed without using bedrails?: A Little Help needed moving to and from a bed to a chair (including a wheelchair)?: A Little Help needed standing up from a chair using your arms (e.g., wheelchair or bedside chair)?: A Little Help needed to walk in hospital room?: A Little Help needed climbing 3-5 steps with a railing? : A Lot 6 Click Score: 17    End of Session Equipment Utilized During Treatment: Gait belt Activity Tolerance: Patient tolerated treatment well Patient left: in bed;with call bell/phone within reach;with bed alarm set;with family/visitor present Nurse Communication: Mobility status PT Visit Diagnosis: Other abnormalities of gait and mobility (R26.89);Pain Pain - Right/Left:  (right posterior leg>left) Pain - part of body: Leg    Time: 6294-7654 PT Time Calculation (min) (ACUTE ONLY): 24 min   Charges:   PT Evaluation $PT Eval Low Complexity: 1 Low PT Treatments $Gait Training: 8-22 mins         Arby Barrette, PT [place]  Pager [phone] Office [phone]   Rexanne Mano 07/27/2021, 10:11 AM

## 2021-07-27 NOTE — TOC Transition Note (Signed)
Transition of Care [facility]) - CM/SW Discharge Note   Patient Details  Name: Desiree Ortega MRN: [id number] Date of Birth: June 09, 1946  [facility]) CM/SW Contact:  Carles Collet, RN Phone Number: 07/27/2021, 4:32 PM   Clinical Narrative:    Damaris Schooner w patient, she is inquiring about DC, informed MD that DC has been set up.  Rollator will be delivered to room today. Outpatient PT referral placed to [facility], added to AVS. Patient to DC to home w spouse.     Final next level of care: Home/Self Care Barriers to Discharge: No Barriers Identified   Patient Goals and CMS Choice Patient states their goals for this hospitalization and ongoing recovery are:: return home CMS Medicare.gov Compare Post Acute Care list provided to:: Patient Choice offered to / list presented to : Patient  Discharge Placement                       Discharge Plan and Services                DME Arranged: Walker rolling with seat DME Agency: [employer] Date DME Agency Contacted: 07/27/21 Time DME Agency Contacted: [phone] Representative spoke with at DME Agency: [place] [facility] Interventions     Readmission Risk Interventions No flowsheet data found.

## 2021-07-27 NOTE — Consult Note (Signed)
Reason for Consult: Bilateral leg pain Referring Physician: Medicine  Desiree Ortega is an 76 y.o. female.  HPI: 76 year old female with a 1 month history of bilateral lower extremity pain.  Symptoms worsen with prolonged standing or walking.  She has minimal if any back pain.  She has no weakness.  She has some intermittent feelings of numbness and tingling distally in both lower extremities.  She has no bowel or bladder dysfunction.  She has been treated in the past with oral steroids with no significant improvement.  She is required evaluation in the emergency room on 4 different occasions.  Past Medical History:  Diagnosis Date   Allergy    Cancer ([place]) on back,leg,tailbone   prev squam and basal cell skin CA removed- Dr. Evorn Gong   Diverticulosis    Moderate   Female bladder prolapse    H/O cold sores    History of colon polyps    History of kidney stones    Hyperlipidemia    Migraine with aura    Osteopenia    prev on fosamax for a few years then started evista at age ~32, T score improved  from -2.29/-1.96 to -1.8/-1.7 as of 06/2011, repeat DXA done 2015    Past Surgical History:  Procedure Laterality Date   BREAST BIOPSY Left    benign   CATARACT EXTRACTION W/ INTRAOCULAR LENS IMPLANT Left 11/2016   CATARACT EXTRACTION W/ INTRAOCULAR LENS IMPLANT Right 12/22/2016   COLONOSCOPY  08/15/2013   cyst on back     2 times   cyst on eyelid     right eye   CYSTOCELE REPAIR N/A 02/01/2018   Procedure: ANTERIOR REPAIR (CYSTOCELE);  Surgeon: Bjorn Loser, MD;  Location: [facility];  Service: Urology;  Laterality: N/A;   CYSTOSCOPY N/A 02/01/2018   Procedure: cystoscopy;  Surgeon: Bjorn Loser, MD;  Location: [facility];  Service: Urology;  Laterality: N/A;   LAPAROSCOPIC VAGINAL HYSTERECTOMY WITH SALPINGO OOPHORECTOMY Bilateral 02/01/2018   Procedure: LAPAROSCOPIC ASSISTED VAGINAL HYSTERECTOMY WITH BILATERAL SALPINGO OOPHORECTOMY;  Surgeon: Servando Salina, MD;  Location: [facility];   Service: Gynecology;  Laterality: Bilateral;   SQUAMOUS CELL CARCINOMA EXCISION     on back/removed 2 times/and thigh   SQUAMOUS CELL CARCINOMA EXCISION Left 07/15/2016   left lower calf   SSC removed  1988   Birthmark removal partial 1/3   TONSILLECTOMY  1950   TUBAL LIGATION  1980's    Family History  Problem Relation Age of Onset   Osteopenia Mother    Hypertension Father    Heart disease Father        AFIB, PVD stents LE's, pacer   Stroke Father    Cancer Brother        prostate, prostatectomy, radiation 5 years later, Hormone Tx   Prostate cancer Brother    Heart disease Brother    Cancer Brother        prostate, prostatectomy   Prostate cancer Brother    Diabetes Other    Cancer Other        Non-Hodgkin's Lymphoma   Depression Daughter    Alcohol abuse Neg Hx    Drug abuse Neg Hx    Colon cancer Neg Hx    Breast cancer Neg Hx    Bladder Cancer Neg Hx    Kidney cancer Neg Hx     Social History:  reports that she quit smoking about 20 years ago. Her smoking use included cigarettes. She has a 18.00 pack-year smoking history.  She has never used smokeless tobacco. She reports current alcohol use of about 2.0 standard drinks per week. She reports that she does not use drugs.  Allergies:  Allergies  Allergen Reactions   Alendronate Sodium Other (See Comments)    heartburn   Codeine Nausea Only   Influenza Vaccines     Local reaction to vaccine in 2014   Keflex [Cephalexin] Rash    All around her torso area    Medications: I have reviewed the patient's current medications.  Results for orders placed or performed during the hospital encounter of 07/26/21 (from the past 48 hour(s))  Resp Panel by RT-PCR (Flu A&B, Covid) Nasopharyngeal Swab     Status: None   Collection Time: 07/26/21  6:06 PM   Specimen: Nasopharyngeal Swab; Nasopharyngeal(NP) swabs in vial transport medium  Result Value Ref Range   SARS Coronavirus 2 by RT PCR NEGATIVE NEGATIVE    Comment:  (NOTE) SARS-CoV-2 target nucleic acids are NOT DETECTED.  The SARS-CoV-2 RNA is generally detectable in upper respiratory specimens during the acute phase of infection. The lowest concentration of SARS-CoV-2 viral copies this assay can detect is 138 copies/mL. A negative result does not preclude SARS-Cov-2 infection and should not be used as the sole basis for treatment or other patient management decisions. A negative result may occur with  improper specimen collection/handling, submission of specimen other than nasopharyngeal swab, presence of viral mutation(s) within the areas targeted by this assay, and inadequate number of viral copies(<138 copies/mL). A negative result must be combined with clinical observations, patient history, and epidemiological information. The expected result is Negative.  Fact Sheet for Patients:  EntrepreneurPulse.com.au  Fact Sheet for Healthcare Providers:  IncredibleEmployment.be  This test is no t yet approved or cleared by the [place] FDA and  has been authorized for detection and/or diagnosis of SARS-CoV-2 by FDA under an Emergency Use Authorization (EUA). This EUA will remain  in effect (meaning this test can be used) for the duration of the COVID-19 declaration under Section 564(b)(1) of the Act, 21 U.S.C.section 360bbb-3(b)(1), unless the authorization is terminated  or revoked sooner.       Influenza A by PCR NEGATIVE NEGATIVE   Influenza B by PCR NEGATIVE NEGATIVE    Comment: (NOTE) The Xpert Xpress SARS-CoV-2/FLU/RSV plus assay is intended as an aid in the diagnosis of influenza from Nasopharyngeal swab specimens and should not be used as a sole basis for treatment. Nasal washings and aspirates are unacceptable for Xpert Xpress SARS-CoV-2/FLU/RSV testing.  Fact Sheet for Patients: EntrepreneurPulse.com.au  Fact Sheet for Healthcare  Providers: IncredibleEmployment.be  This test is not yet approved or cleared by the [place] FDA and has been authorized for detection and/or diagnosis of SARS-CoV-2 by FDA under an Emergency Use Authorization (EUA). This EUA will remain in effect (meaning this test can be used) for the duration of the COVID-19 declaration under Section 564(b)(1) of the Act, 21 U.S.C. section 360bbb-3(b)(1), unless the authorization is terminated or revoked.  Performed at [facility], [place] [address] [place], [place] [address]   CBC with Differential/Platelet     Status: Abnormal   Collection Time: 07/26/21  6:12 PM  Result Value Ref Range   WBC 13.3 (H) 4.0 - 10.5 K/uL   RBC 5.20 (H) 3.87 - 5.11 MIL/uL   Hemoglobin 15.4 (H) 12.0 - 15.0 g/dL   HCT 45.9 36.0 - 46.0 %   MCV 88.3 80.0 - 100.0 fL   MCH 29.6 26.0 -  34.0 pg   MCHC 33.6 30.0 - 36.0 g/dL   RDW 12.5 11.5 - 15.5 %   Platelets 320 150 - 400 K/uL   nRBC 0.0 0.0 - 0.2 %   Neutrophils Relative % 60 %   Neutro Abs 8.1 (H) 1.7 - 7.7 K/uL   Lymphocytes Relative 31 %   Lymphs Abs 4.1 (H) 0.7 - 4.0 K/uL   Monocytes Relative 6 %   Monocytes Absolute 0.8 0.1 - 1.0 K/uL   Eosinophils Relative 1 %   Eosinophils Absolute 0.1 0.0 - 0.5 K/uL   Basophils Relative 1 %   Basophils Absolute 0.1 0.0 - 0.1 K/uL   Immature Granulocytes 1 %   Abs Immature Granulocytes 0.12 (H) 0.00 - 0.07 K/uL    Comment: Performed at [facility] [address], [place], [place] [address]  Basic metabolic panel     Status: Abnormal   Collection Time: 07/26/21  6:12 PM  Result Value Ref Range   Sodium 135 135 - 145 mmol/L   Potassium 4.1 3.5 - 5.1 mmol/L   Chloride 99 98 - 111 mmol/L   CO2 27 22 - 32 mmol/L   Glucose, Bld 104 (H) 70 - 99 mg/dL    Comment: Glucose reference range applies only to samples taken after fasting for at least 8 hours.   BUN 19 8 - 23 mg/dL   Creatinine, Ser 0.72 0.44 - 1.00 mg/dL   Calcium 9.0 8.9 - 10.3  mg/dL   GFR, Estimated >60 >60 mL/min    Comment: (NOTE) Calculated using the CKD-EPI Creatinine Equation (2021)    Anion gap 9 5 - 15    Comment: Performed at [facility] [address] [place], [place] [address]  Sedimentation rate     Status: None   Collection Time: 07/27/21 12:58 AM  Result Value Ref Range   Sed Rate 5 0 - 22 mm/hr    Comment: Performed at [facility] [address], [place], [place] [address]  C-reactive protein     Status: None   Collection Time: 07/27/21 12:58 AM  Result Value Ref Range   CRP 0.5 <1.0 mg/dL    Comment: Performed at [facility], [place] [address] [place], [place] [address]  CBC with Differential/Platelet     Status: Abnormal   Collection Time: 07/27/21 12:58 AM  Result Value Ref Range   WBC 12.7 (H) 4.0 - 10.5 K/uL   RBC 5.08 3.87 - 5.11 MIL/uL   Hemoglobin 14.9 12.0 - 15.0 g/dL   HCT 45.1 36.0 - 46.0 %   MCV 88.8 80.0 - 100.0 fL   MCH 29.3 26.0 - 34.0 pg   MCHC 33.0 30.0 - 36.0 g/dL   RDW 12.8 11.5 - 15.5 %   Platelets 288 150 - 400 K/uL   nRBC 0.2 0.0 - 0.2 %   Neutrophils Relative % 56 %   Neutro Abs 7.1 1.7 - 7.7 K/uL   Lymphocytes Relative 34 %   Lymphs Abs 4.3 (H) 0.7 - 4.0 K/uL   Monocytes Relative 6 %   Monocytes Absolute 0.8 0.1 - 1.0 K/uL   Eosinophils Relative 2 %   Eosinophils Absolute 0.2 0.0 - 0.5 K/uL   Basophils Relative 1 %   Basophils Absolute 0.1 0.0 - 0.1 K/uL   Immature Granulocytes 1 %   Abs Immature Granulocytes 0.17 (H) 0.00 - 0.07 K/uL    Comment: Performed at [facility], [address] [place], [place] [address]    MR CERVICAL SPINE WO CONTRAST  Result Date: 07/26/2021 CLINICAL DATA:  Neck, mid back, and low back pain, with bilateral leg and buttock pain EXAM: MRI CERVICAL, THORACIC AND LUMBAR SPINE WITHOUT CONTRAST TECHNIQUE: Multiplanar and multiecho pulse sequences of the cervical spine, to include the craniocervical junction and cervicothoracic junction, and thoracic and  lumbar spine, were obtained without intravenous contrast. COMPARISON:  No prior MRI of the cervical, thoracic, or lumbar spine. Correlation is made with CT lumbar spine 07/25/2021 FINDINGS: MRI CERVICAL SPINE FINDINGS Alignment: Physiologic. Vertebrae: No acute fracture or suspicious osseous lesion. Cord: Normal signal and morphology. Posterior Fossa, vertebral arteries, paraspinal tissues: Negative. Disc levels: C2-C3: No significant disc bulge. No spinal canal stenosis or neuroforaminal narrowing. C3-C4: No significant disc bulge. No spinal canal stenosis or neuroforaminal narrowing. C4-C5: No significant disc bulge. No spinal canal stenosis or neuroforaminal narrowing. C5-C6: No significant disc bulge. No spinal canal stenosis or neuroforaminal narrowing. C6-C7: Disc height loss with mild disc bulge, which indents the ventral spinal cord. Uncovertebral and facet arthropathy. No spinal canal stenosis. Mild bilateral neural foraminal narrowing. C7-T1: No significant disc bulge. No spinal canal stenosis or neuroforaminal narrowing. MRI THORACIC SPINE FINDINGS Alignment: Levocurvature of the upper thoracic spine, dextrocurvature of the mid to lower thoracic spine. No significant antero or retrolisthesis. Vertebrae: No acute fracture or suspicious osseous lesion. Cord:  Normal signal and morphology. Paraspinal and other soft tissues: Negative. Disc levels: No spinal canal stenosis or neural foraminal narrowing. MRI LUMBAR SPINE FINDINGS Segmentation: 5 lumbar-type vertebral bodies. Partial sacralization of L5 with left L5-S1 pseudoarticulation of a broadened L5 transverse process with the sacrum (series 5, image 17). Alignment: Levocurvature of the lumbar spine. 3 mm anterolisthesis L4 on L5. Vertebrae: No acute fracture or suspicious osseous lesion. Endplate degenerative changes at L3-L4. 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 In the most inferior left Tarlov cyst, there is a fluid fluid level with T1 hypointense and T2 hyperintense material above T1 hypointense and T2 hypointense material, most likely CSF on an additional component, possibly hemorrhage. Conus medullaris and cauda equina: Conus extends to the L1-L2 level. Conus and cauda equina appear normal. Paraspinal and other soft tissues: Bilateral renal cysts. Diverticulosis Disc levels: T12-L1: No significant disc bulge. No spinal canal stenosis or neural foraminal narrowing. L1-L2: Mild disc desiccation and small central disc protrusion. No spinal canal stenosis or neural foraminal narrowing. L2-L3: Mild disc bulge. Mild facet arthropathy. No spinal canal stenosis or neural foraminal narrowing. L3-L4: Mild disc bulge. Mild facet arthropathy. No spinal canal stenosis or neural foraminal narrowing. L4-L5: Trace anterolisthesis with disc unroofing. Mild disc bulge. Severe facet arthropathy. No spinal canal stenosis or neural foraminal narrowing. L5-S1: Mild disc bulge. Mild facet arthropathy. No spinal canal stenosis or neural foraminal narrowing. IMPRESSION: 1. Tarlov cysts in the sacrum, with fluid-fluid levels, with the dependent portion T1 and T2 hypointense, possibly hemorrhage. No acute abnormality is seen in the remainder of the spinal canal as a source for the hemorrhage, suggesting a cerebral source. 2. Multifocal curvature of the thoracolumbar spine, without significant spinal canal stenosis or neural foraminal narrowing. 3. C6-C7 mild bilateral neural foraminal narrowing. No spinal canal stenosis or other neural foraminal narrowing in the cervical spine. 4. No acute fracture or suspicious lesion.  These results were called by telephone at the time of interpretation on 07/26/2021 at 10:28 pm to provider [facility] , who verbally acknowledged these results. Electronically Signed   By:  Merilyn Baba M.D.   On: 07/26/2021 22:28   MR THORACIC SPINE WO CONTRAST  Result Date: 07/26/2021 CLINICAL DATA:  Neck, mid back, and low back pain, with bilateral leg and buttock pain EXAM: MRI CERVICAL, THORACIC AND LUMBAR SPINE WITHOUT CONTRAST TECHNIQUE: Multiplanar and multiecho pulse sequences of the cervical spine, to include the craniocervical junction and cervicothoracic junction, and thoracic and lumbar spine, were obtained without intravenous contrast. COMPARISON:  No prior MRI of the cervical, thoracic, or lumbar spine. Correlation is made with CT lumbar spine 07/25/2021 FINDINGS: MRI CERVICAL SPINE FINDINGS Alignment: Physiologic. Vertebrae: No acute fracture or suspicious osseous lesion. Cord: Normal signal and morphology. Posterior Fossa, vertebral arteries, paraspinal tissues: Negative. Disc levels: C2-C3: No significant disc bulge. No spinal canal stenosis or neuroforaminal narrowing. C3-C4: No significant disc bulge. No spinal canal stenosis or neuroforaminal narrowing. C4-C5: No significant disc bulge. No spinal canal stenosis or neuroforaminal narrowing. C5-C6: No significant disc bulge. No spinal canal stenosis or neuroforaminal narrowing. C6-C7: Disc height loss with mild disc bulge, which indents the ventral spinal cord. Uncovertebral and facet arthropathy. No spinal canal stenosis. Mild bilateral neural foraminal narrowing. C7-T1: No significant disc bulge. No spinal canal stenosis or neuroforaminal narrowing. MRI THORACIC SPINE FINDINGS Alignment: Levocurvature of the upper thoracic spine, dextrocurvature of the mid to lower thoracic spine. No significant antero or retrolisthesis. Vertebrae: No acute fracture or suspicious osseous lesion. Cord:  Normal signal and morphology. Paraspinal and other soft tissues: Negative. Disc levels: No spinal canal stenosis or neural foraminal narrowing. MRI LUMBAR SPINE FINDINGS Segmentation: 5 lumbar-type vertebral bodies. Partial sacralization of L5  with left L5-S1 pseudoarticulation of a broadened L5 transverse process with the sacrum (series 5, image 17). Alignment: Levocurvature of the lumbar spine. 3 mm anterolisthesis L4 on L5. Vertebrae: No acute fracture or suspicious osseous lesion. Endplate degenerative changes at L3-L4. 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 In the most inferior left Tarlov cyst, there is a fluid fluid level with T1 hypointense and T2 hyperintense material above T1 hypointense and T2 hypointense material, most likely CSF on an additional component, possibly hemorrhage. Conus medullaris and cauda equina: Conus extends to the L1-L2 level. Conus and cauda equina appear normal. Paraspinal and other soft tissues: Bilateral renal cysts. Diverticulosis Disc levels: T12-L1: No significant disc bulge. No spinal canal stenosis or neural foraminal narrowing. L1-L2: Mild disc desiccation and small central disc protrusion. No spinal canal stenosis or neural foraminal narrowing. L2-L3: Mild disc bulge. Mild facet arthropathy. No spinal canal stenosis or neural foraminal narrowing. L3-L4: Mild disc bulge. Mild facet arthropathy. No spinal canal stenosis or neural foraminal narrowing. L4-L5: Trace anterolisthesis with disc unroofing. Mild disc bulge. Severe facet arthropathy. No spinal canal stenosis or neural foraminal narrowing. L5-S1: Mild disc bulge. Mild facet arthropathy. No spinal canal stenosis or neural foraminal narrowing. IMPRESSION: 1. Tarlov cysts in the sacrum, with fluid-fluid levels, with the dependent portion T1 and T2 hypointense, possibly hemorrhage. No acute abnormality is seen in the remainder of the spinal canal as a source for the hemorrhage, suggesting a cerebral source. 2. Multifocal curvature of the thoracolumbar spine, without significant spinal  canal  stenosis or neural foraminal narrowing. 3. C6-C7 mild bilateral neural foraminal narrowing. No spinal canal stenosis or other neural foraminal narrowing in the cervical spine. 4. No acute fracture or suspicious lesion. These results were called by telephone at the time of interpretation on 07/26/2021 at 10:28 pm to provider [facility] , who verbally acknowledged these results. Electronically Signed   By: Merilyn Baba M.D.   On: 07/26/2021 22:28   MR LUMBAR SPINE WO CONTRAST  Result Date: 07/26/2021 CLINICAL DATA:  Neck, mid back, and low back pain, with bilateral leg and buttock pain EXAM: MRI CERVICAL, THORACIC AND LUMBAR SPINE WITHOUT CONTRAST TECHNIQUE: Multiplanar and multiecho pulse sequences of the cervical spine, to include the craniocervical junction and cervicothoracic junction, and thoracic and lumbar spine, were obtained without intravenous contrast. COMPARISON:  No prior MRI of the cervical, thoracic, or lumbar spine. Correlation is made with CT lumbar spine 07/25/2021 FINDINGS: MRI CERVICAL SPINE FINDINGS Alignment: Physiologic. Vertebrae: No acute fracture or suspicious osseous lesion. Cord: Normal signal and morphology. Posterior Fossa, vertebral arteries, paraspinal tissues: Negative. Disc levels: C2-C3: No significant disc bulge. No spinal canal stenosis or neuroforaminal narrowing. C3-C4: No significant disc bulge. No spinal canal stenosis or neuroforaminal narrowing. C4-C5: No significant disc bulge. No spinal canal stenosis or neuroforaminal narrowing. C5-C6: No significant disc bulge. No spinal canal stenosis or neuroforaminal narrowing. C6-C7: Disc height loss with mild disc bulge, which indents the ventral spinal cord. Uncovertebral and facet arthropathy. No spinal canal stenosis. Mild bilateral neural foraminal narrowing. C7-T1: No significant disc bulge. No spinal canal stenosis or neuroforaminal narrowing. MRI THORACIC SPINE FINDINGS Alignment: Levocurvature of the upper thoracic  spine, dextrocurvature of the mid to lower thoracic spine. No significant antero or retrolisthesis. Vertebrae: No acute fracture or suspicious osseous lesion. Cord:  Normal signal and morphology. Paraspinal and other soft tissues: Negative. Disc levels: No spinal canal stenosis or neural foraminal narrowing. MRI LUMBAR SPINE FINDINGS Segmentation: 5 lumbar-type vertebral bodies. Partial sacralization of L5 with left L5-S1 pseudoarticulation of a broadened L5 transverse process with the sacrum (series 5, image 17). Alignment: Levocurvature of the lumbar spine. 3 mm anterolisthesis L4 on L5. Vertebrae: No acute fracture or suspicious osseous lesion. Endplate degenerative changes at L3-L4. 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 In the most inferior left Tarlov cyst, there is a fluid fluid level with T1 hypointense and T2 hyperintense material above T1 hypointense and T2 hypointense material, most likely CSF on an additional component, possibly hemorrhage. Conus medullaris and cauda equina: Conus extends to the L1-L2 level. Conus and cauda equina appear normal. Paraspinal and other soft tissues: Bilateral renal cysts. Diverticulosis Disc levels: T12-L1: No significant disc bulge. No spinal canal stenosis or neural foraminal narrowing. L1-L2: Mild disc desiccation and small central disc protrusion. No spinal canal stenosis or neural foraminal narrowing. L2-L3: Mild disc bulge. Mild facet arthropathy. No spinal canal stenosis or neural foraminal narrowing. L3-L4: Mild disc bulge. Mild facet arthropathy. No spinal canal stenosis or neural foraminal narrowing. L4-L5: Trace anterolisthesis with disc unroofing. Mild disc bulge. Severe facet arthropathy. No spinal canal stenosis or neural foraminal narrowing. L5-S1: Mild disc bulge. Mild facet arthropathy. No  spinal canal stenosis or neural foraminal narrowing. IMPRESSION: 1. Tarlov cysts in the sacrum, with fluid-fluid levels, with the dependent portion T1 and T2 hypointense,  possibly hemorrhage. No acute abnormality is seen in the remainder of the spinal canal as a source for the hemorrhage, suggesting a cerebral source. 2. Multifocal curvature of the thoracolumbar spine, without significant spinal canal stenosis or neural foraminal narrowing. 3. C6-C7 mild bilateral neural foraminal narrowing. No spinal canal stenosis or other neural foraminal narrowing in the cervical spine. 4. No acute fracture or suspicious lesion. These results were called by telephone at the time of interpretation on 07/26/2021 at 10:28 pm to provider [facility] , who verbally acknowledged these results. Electronically Signed   By: Merilyn Baba M.D.   On: 07/26/2021 22:28    Pertinent items noted in HPI and remainder of comprehensive ROS otherwise negative. Blood pressure (!) 145/77, pulse 82, temperature 98.1 F (36.7 C), temperature source Oral, resp. rate 16, height 5\' 6"  (1.676 m), weight 66 kg, SpO2 98 %. Patient is awake and alert.  She is oriented and appropriate.  Cranial nerve function intact.  Motor examination 5/5 bilateral.  Sensory examination normal bilaterally.  Reflexes normal active.  Straight raising negative bilaterally.  Gait not tested.  Examination head ears eyes nose and throat is unremarked.  Chest and abdomen are benign.  Extremities are free from injury or deformity.  Assessment/Plan: Patient has bilateral lower extremity pain of unclear etiology.  She has some mild degenerative change at L4-5 with some facet arthropathy and early stenosis but no evidence of disc herniation spinal stenosis or any compressive lesions.  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  These were not associated with any sacral fracture or other significant structural problem.  Lastly if these nerve  roots were in fact symptomatic they would not cause pain radiating down the lower extremities but rather because sacral pain.  At this point I do not see any type of neurosurgical interventions to improve her overall pain situation.  I think she should be treated with oral steroid Dosepak.  She should continue on Neurontin.  She should undergo physical therapy as an outpatient.  And she can follow-up with me in a couple weeks.  Desiree Ortega 07/27/2021, 12:23 PM

## 2021-07-27 NOTE — Evaluation (Signed)
Occupational Therapy Evaluation Patient Details Name: Desiree Ortega MRN: [id number] DOB: February 05, 1946 Today's Date: 07/27/2021   History of Present Illness 76 y.o. female presents 07/26/21 with complaints of bilateral lower extremity pain over the last 12 days. 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 She was active with regular participation in exercise and driving. She is currently using a RW to ambulate with sup (following teaching with PT). She is able to utilize compensatory  strategies taught and reinforced by OT during session to complete bathing, dressing and toileting. Husband concerned for his limited ability to drive so educated pt and family in community resources including grocery pick-up and delivery to limit husband's need to drive as he is limited  by hx of stroke. Pt and family verbalized understanding and appreciation for OT consult. No current follow-up therapy needs as pt will resume to PLOF once pain improves/resolves. OT to sign off.      Recommendations for follow up therapy are one component of a multi-disciplinary discharge planning process, led by the attending physician.  Recommendations may be updated based on patient status, additional functional criteria and insurance authorization.   Follow Up Recommendations  No OT follow up    Assistance Recommended at Discharge Intermittent Supervision/Assistance  Patient can return home with the following Assist for transportation;Assistance with cooking/housework       Equipment Recommendations    RW and shower chair (pt reports she may have one at home)   Recommendations for Other Services PT consult     Precautions / Restrictions Precautions Precautions: Fall Precaution Comments: denies  falls Restrictions Weight Bearing Restrictions: No      Mobility Bed Mobility Overal bed mobility: Modified Independent (increased time via log roll, reporting minimal pain) Bed Mobility: Rolling, Sidelying to Sit, Sit to Sidelying Rolling: Modified independent (Device/Increase time) Sidelying to sit: Modified independent (Device/Increase time)     Sit to sidelying: Modified independent (Device/Increase time)      Transfers Overall transfer level: Needs assistance Equipment used: Rolling walker (2 wheels) Transfers: Sit to/from Stand Sit to Stand: Supervision                  Balance Overall balance assessment: Needs assistance Sitting-balance support: No upper extremity supported, Feet supported Sitting balance-Leahy Scale: Good           ADL either performed or assessed with clinical judgement   ADL Overall ADL's : Needs assistance/impaired Eating/Feeding: Independent   Grooming: Wash/dry hands;Supervision/safety;Standing   Upper Body Bathing: Sitting;Modified independent   Lower Body Bathing: Supervison/ safety;Sit to/from stand;With adaptive equipment   Upper Body Dressing : Independent   Lower Body Dressing: Supervision/safety;Sit to/from stand;Cueing for compensatory techniques   Toilet Transfer: Supervision/safety;Comfort height toilet Toilet Transfer Details (indicate cue type and reason): cues for mod hand placement Toileting- Clothing Manipulation and Hygiene: Modified independent       Functional mobility during ADLs: Supervision/safety;Rolling walker (2 wheels) General ADL Comments: pt given reinforcement of use of compensatory strategies for ADL completion given level of pain as well as need for use of RW     Vision   No deficits               Pertinent Vitals/Pain Pain Assessment Pain Assessment: 0-10 Pain Score: 5  Pain Location: backs of thighs Pain Descriptors /  Indicators: Aching, Sharp Pain Intervention(s): Limited  activity within patient's tolerance, Monitored during session, Repositioned     Hand Dominance     Extremity/Trunk Assessment Upper Extremity Assessment Upper Extremity Assessment: Overall WFL for tasks assessed   Lower Extremity Assessment Lower Extremity Assessment: Overall WFL for tasks assessed   Cervical / Trunk Assessment Cervical / Trunk Assessment: Normal   Communication Communication Communication: No difficulties   Cognition Arousal/Alertness: Awake/alert Behavior During Therapy: WFL for tasks assessed/performed Overall Cognitive Status: Within Functional Limits for tasks assessed                      Home Living Family/patient expects to be discharged to:: Private residence Living Arrangements: Spouse/significant other Available Help at Discharge: Family Type of Home: House Home Access: Stairs to enter Technical brewer of Steps: 1   Home Layout: Two level;Able to live on main level with bedroom/bathroom     Bathroom Shower/Tub: Occupational psychologist: Handicapped height Bathroom Accessibility: Yes   Home Equipment: Grab bars - tub/shower   Additional Comments: normally very active; walks 5 mi, does yoga; swims laps      Prior Functioning/Environment Prior Level of Function : Independent/Modified Independent;Driving                OT Problem List: Pain            OT Frequency:   No follow-up therapy needs    AM-PAC OT "6 Clicks" Daily Activity     Outcome Measure Help from another person eating meals?: None Help from another person taking care of personal grooming?: None Help from another person toileting, which includes using toliet, bedpan, or urinal?: None Help from another person bathing (including washing, rinsing, drying)?: A Little Help from another person to put on and taking off regular upper body clothing?: None Help from another person to put on and taking off regular lower body clothing?: None 6 Click  Score: 23   End of Session Equipment Utilized During Treatment: Rolling walker (2 wheels)  Activity Tolerance: Patient limited by pain Patient left: in bed;with call bell/phone within reach;with family/visitor present  OT Visit Diagnosis: Pain                Time: 9794-8016 OT Time Calculation (min): 24 min Charges:  OT Evaluation $OT Eval Low Complexity: 1 Low OT Treatments $Self Care/Home Management : 8-22 mins   Naomie Dean Baby Stairs, OTR/L 07/27/2021, 11:06 AM

## 2021-07-27 NOTE — ED Notes (Signed)
Report called to Jarvis Morgan, RN.

## 2021-07-28 ENCOUNTER — Telehealth: Payer: Self-pay

## 2021-07-28 DIAGNOSIS — M79604 Pain in right leg: Secondary | ICD-10-CM | POA: Diagnosis not present

## 2021-07-28 DIAGNOSIS — M79605 Pain in left leg: Secondary | ICD-10-CM | POA: Diagnosis not present

## 2021-07-28 DIAGNOSIS — M5410 Radiculopathy, site unspecified: Secondary | ICD-10-CM | POA: Diagnosis not present

## 2021-07-28 DIAGNOSIS — E785 Hyperlipidemia, unspecified: Secondary | ICD-10-CM | POA: Diagnosis not present

## 2021-07-28 DIAGNOSIS — R531 Weakness: Secondary | ICD-10-CM | POA: Diagnosis not present

## 2021-07-28 DIAGNOSIS — R339 Retention of urine, unspecified: Secondary | ICD-10-CM

## 2021-07-28 LAB — CBC WITH DIFFERENTIAL/PLATELET
Abs Immature Granulocytes: 0.1 10*3/uL — ABNORMAL HIGH (ref 0.00–0.07)
Basophils Absolute: 0 10*3/uL (ref 0.0–0.1)
Basophils Relative: 0 %
Eosinophils Absolute: 0 10*3/uL (ref 0.0–0.5)
Eosinophils Relative: 0 %
HCT: 42.5 % (ref 36.0–46.0)
Hemoglobin: 14.5 g/dL (ref 12.0–15.0)
Immature Granulocytes: 1 %
Lymphocytes Relative: 12 %
Lymphs Abs: 1 10*3/uL (ref 0.7–4.0)
MCH: 30.1 pg (ref 26.0–34.0)
MCHC: 34.1 g/dL (ref 30.0–36.0)
MCV: 88.4 fL (ref 80.0–100.0)
Monocytes Absolute: 0.1 10*3/uL (ref 0.1–1.0)
Monocytes Relative: 1 %
Neutro Abs: 7.7 10*3/uL (ref 1.7–7.7)
Neutrophils Relative %: 86 %
Platelets: 291 10*3/uL (ref 150–400)
RBC: 4.81 MIL/uL (ref 3.87–5.11)
RDW: 12.5 % (ref 11.5–15.5)
WBC: 9 10*3/uL (ref 4.0–10.5)
nRBC: 0 % (ref 0.0–0.2)

## 2021-07-28 LAB — BASIC METABOLIC PANEL
Anion gap: 9 (ref 5–15)
BUN: 17 mg/dL (ref 8–23)
CO2: 26 mmol/L (ref 22–32)
Calcium: 8.9 mg/dL (ref 8.9–10.3)
Chloride: 101 mmol/L (ref 98–111)
Creatinine, Ser: 0.71 mg/dL (ref 0.44–1.00)
GFR, Estimated: >60 mL/min (ref >60)
Glucose, Bld: 136 mg/dL — ABNORMAL HIGH (ref 70–99)
Potassium: 4.4 mmol/L (ref 3.5–5.1)
Sodium: 136 mmol/L (ref 135–145)

## 2021-07-28 MED ORDER — GABAPENTIN 100 MG PO CAPS: 200.0000 mg | ORAL_CAPSULE | Freq: Three times a day (TID) | ORAL | 0 refills | Status: DC

## 2021-07-28 MED ORDER — METHYLPREDNISOLONE 4 MG PO TBPK: ORAL_TABLET | ORAL | 0 refills | Status: DC

## 2021-07-28 MED ORDER — HYDROCODONE-ACETAMINOPHEN 5-325 MG PO TABS: 1.0000 | ORAL_TABLET | Freq: Four times a day (QID) | ORAL | 0 refills | Status: DC | PRN

## 2021-07-28 NOTE — Telephone Encounter (Addendum)
Pt called [facility]; pt said she just got home from hospital today. Pt said she is feeling some better with pain but is on medication. Pt still does not know what is causing the pain in her legs. Pt said she was on phone 12' today to schedule appt with Dr Matilde Sprang at [facility] urology and pt never did speak with anyone. Pt is asking that Dr Damita Dunnings have medical assistant call to schedule appt with Dr Matilde Sprang for HFU since pt could not get through. Pt said if Dr Matilde Sprang not available pt will take appt that is with the first available provider. Pt said she has seen Dr Matilde Sprang before. Pt said she will try again tomorrow early to get [facility] HFU appt and pt will cb to let Dr Damita Dunnings know if was successful for getting a soon appt or pt will cb if cannot get an appropriate appt.  Per discharge note from [facility] pt is to FU with Dr Damita Dunnings in 1 wk. Pt scheduled appt at [facility] on [address] on 08/05/21 for 30' appt at 3 PM. Sending note to Dr Damita Dunnings and Janett Billow CMA.

## 2021-07-28 NOTE — Telephone Encounter (Signed)
Per chart review tab pt was admitted to [facility] hospital on 07/26/21.

## 2021-07-28 NOTE — Progress Notes (Signed)
Discharge instructions (including medications) discussed with and copy provided to patient/caregiver 

## 2021-07-28 NOTE — Progress Notes (Signed)
Bladder scanned after patient had void in the bathroom 375cc.  In and out cath with 400 cc of clear yellow urine .  Patient tolearated procedure well.  Will continue monitor patient

## 2021-07-28 NOTE — Addendum Note (Signed)
Addended by: Tonia Ghent on: 07/28/2021 04:50 PM   Modules accepted: Orders

## 2021-07-28 NOTE — Telephone Encounter (Signed)
Awaiting inpatient reports.  Thanks.

## 2021-07-28 NOTE — Telephone Encounter (Signed)
Please let her know that I saw her inpatient notes over the weekend- I was following along.  I put in a referral to urology to try to get her an appointment.  That is likely the best way to get that done.  Thanks.

## 2021-07-28 NOTE — Plan of Care (Signed)
°  Problem: Education: Goal: Knowledge of General Education information will improve Description: Including pain rating scale, medication(s)/side effects and non-pharmacologic comfort measures Outcome: Adequate for Discharge   Problem: Health Behavior/Discharge Planning: Goal: Ability to manage health-related needs will improve Outcome: Adequate for Discharge   Problem: Clinical Measurements: Goal: Ability to maintain clinical measurements within normal limits will improve Outcome: Adequate for Discharge   Problem: Acute Rehab PT Goals(only PT should resolve) Goal: Pt will Roll Supine to Side Outcome: Adequate for Discharge Goal: Pt Will Go Supine/Side To Sit Outcome: Adequate for Discharge Goal: Patient Will Transfer Sit To/From Stand Outcome: Adequate for Discharge Goal: Pt Will Ambulate Outcome: Adequate for Discharge

## 2021-07-28 NOTE — Telephone Encounter (Signed)
[facility] Night - Client TELEPHONE ADVICE RECORD [facility] Patient Name: Desiree Ortega [facility] Gender: Female DOB: Apr 16, 1946 Age: 76 Y 31 M 26 D Return Phone Number: [phone] (Primary), [phone] (Secondary) Address: City/ State/ Zip: [place] [address] Client [place] Night - Client Client Site [place] - Night Provider Renford Dills - MD Contact Type Call Who Is Calling Patient / Member / Family / Caregiver Call Type Triage / Clinical Relationship To Patient Self Return Phone Number [phone] (Primary) Chief Complaint Hip pain Reason for Call Symptomatic / Request for [place] states she is in extreme hip pain and her lower body. Translation No Nurse Assessment Nurse: Claiborne Billings, RN, [place] Date/Time (Eastern Time): 07/26/2021 1:14:44 PM Confirm and document reason for call. If symptomatic, describe symptoms. ---Caller states she has pain in her right and left hip, right is worse. States she has had the pain since 1/23, no injury that she is aware of. States she has been to the ER for this x 3 and has been to PCP x 1 but "they are not taking care of it". States most recently she went to ER yesterday morning and had more tests, x ray, MRI; was given oral Toradol and IM naproxen which helped for several hours but when the pain returned it was worse than ever. Current pain level is 9/10. Does the patient have any new or worsening symptoms? ---Yes Will a triage be completed? ---Yes Related visit to physician within the last 2 weeks? ---Yes Does the PT have any chronic conditions? (i.e. diabetes, asthma, this includes High risk factors for pregnancy, etc.) ---No Is this a behavioral health or substance abuse call? ---No Guidelines Guideline Title Affirmed Question Affirmed Notes Nurse Date/Time (Eastern Time) Hip Pain [1] SEVERE pain (e.g., excruciating, unable  to do any normal activities) AND [2] not improved after Claiborne Billings, RN, Maudie Mercury 07/26/2021 1:18:19 PM PLEASE NOTE: All timestamps contained within this report are represented as [place] Standard Time. CONFIDENTIALTY NOTICE: This fax transmission is intended only for the addressee. It contains information that is legally privileged, confidential or otherwise protected from use or disclosure. If you are not the intended recipient, you are strictly prohibited from reviewing, disclosing, copying using or disseminating any of this information or taking any action in reliance on or regarding this information. If you have received this fax in error, please notify us immediately by telephone so that we can arrange for its return to [place]. Phone: [phone], Toll-Free: [phone], Fax: [phone] Page: 2 of 2 Call Id: [id number] Guidelines Guideline Title Affirmed Question Affirmed Notes Nurse Date/Time Eilene Ghazi Time) 2 hours of pain medicine Disp. Time Eilene Ghazi Time) Disposition Final User 07/26/2021 1:22:35 PM See HCP within 4 Hours (or PCP triage) Yes Claiborne Billings, RN, Max Sane Disagree/Comply Comply Caller Understands Yes PreDisposition Go to ED Care Advice Given Per Guideline SEE HCP (OR PCP TRIAGE) WITHIN 4 HOURS: * IF OFFICE WILL BE CLOSED AND NO PCP (PRIMARY CARE PROVIDER) SECOND-LEVEL TRIAGE: You need to be seen within the next 3 or 4 hours. A nearby Urgent Care Center [facility]) is often a good source of care. Another choice is to go to the ED. Go sooner if you become worse. BRING MEDICINES: * Please bring a list of your current medicines when you go to see the doctor. PAIN MEDICINES: * Use the lowest amount of medicine that makes your pain better. CALL BACK IF: * You become worse CARE ADVICE given per  Hip Pain (Adult) guideline. Comments User: Suezanne Jacquet, RN Date/Time Eilene Ghazi Time): 07/26/2021 1:23:22 PM Caller advised to ask for referral to orthopedic doctor. Referrals [place]

## 2021-07-28 NOTE — Discharge Summary (Signed)
Physician Discharge Summary   Patient: Desiree Ortega MRN: [id number] DOB: April 23, 1946  Admit date:     07/26/2021  Discharge date: 07/28/21  Discharge Physician: Alma Friendly   PCP: Tonia Ghent, MD   Recommendations at discharge:   Follow-up with PCP in 1 week Follow-up with neurosurgery in 2 weeks Follow-up with urology in 1 week   Discharge Diagnoses: Principal Problem:   Leg pain, bilateral Active Problems:   HLD (hyperlipidemia)   Leukocytosis   Radiculopathy   Elevated blood pressure reading     Hospital Course: Desiree Ortega is a 76 y.o. female with medical history significant of HLD, nephrolithiasis, and skin cancer presents with complaints of bilateral lower extremity pain over the last 12 days. Patient reported persisted pain in her hips that radiates down the back of her leg to her knee and is worse on the right than on the left. Pt also reported bending her neck forward produces a sharp shooting pain running down her neck. Sitting makes symptoms worse. Denies any recent falls, trauma, or heavy lifting to onset symptoms, denies any saddle anesthesia. Reports difficulty urinating. CT scan of the lumbar spine which showed degenerative changes with foraminal stenosis multilevel degenerative changes with stenosis present. In the ED patient was noted to be hypertensive up to 183/87. Labs fairly stable except for mild leukocytosis. Neurosurgery consulted. Patient admitted for further management.     Today, patient reports feeling better, denies any worsening bilateral lower extremity pain, chest pain, abdominal pain, nausea/vomiting, fever/chills.  Patient was able to urinate, with good stream, but does feel like she has incomplete emptying.  Advised patient to follow-up with urology as an outpatient, also follow-up with PCP in 1 week and neurosurgery in 2 weeks.    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  No other acute abnormality seen Neurosurgery consulted, tarlov cysts are benign findings, no neurosurgical interventions.  Recommend oral steroid Dosepak and Neurontin, PT and follow-up in a couple of weeks Pain management   Elevated blood pressure reading- (present on admission) In the ED, SBP noted to be in 180s Not on any BP meds at home, likely worsened by pain BP somewhat stable Follow-up with PCP   Leukocytosis- (present on admission) Resolved Currently afebrile Possibly reactive UA unremarkable for infection   Urinary retention Unknown etiology, discussed with neurosurgery, not related to MRI findings/patient's complaints Reports remote history Renal ultrasound showed normal bladder distention, mild left-sided hydronephrosis Follow-up with urology as an outpatient   HLD (hyperlipidemia)- (present on admission) Last available lipid panel from 08/19/2020, with LDL of 174 Not on any meds Outpatient follow up      Consultants: Neurosurgery Procedures performed: None Disposition: Home Diet recommendation:  Cardiac diet  DISCHARGE MEDICATION: Allergies as of 07/28/2021       Reactions   Alendronate Sodium Other (See Comments)   heartburn   Codeine Nausea Only   Influenza Vaccines    Local reaction to vaccine in 2014   Keflex [cephalexin] Rash   All around her torso area        Medication List     STOP taking these medications    naproxen 375 MG tablet Commonly known as: NAPROSYN  TAKE these medications    acetaminophen 500 MG tablet Commonly known as: TYLENOL Take 1,000 mg by mouth 2 (two) times daily as needed for mild pain or headache.   b complex vitamins tablet Take 1 tablet  by mouth daily.   B-12 PO Take 1,000 mcg by mouth daily.   CINNAMON PO Take 2,000 mg by mouth daily.   fexofenadine 180 MG tablet Commonly known as: ALLEGRA Take 1 tablet (180 mg total) by mouth as needed. Seasonal allergies from Spring to Fall What changed:  when to take this reasons to take this   FISH OIL PO Take 1,000 mg by mouth in the morning and at bedtime.   FLAX SEEDS PO Take 1 capsule by mouth daily.   gabapentin 100 MG capsule Commonly known as: Neurontin Take 2 capsules (200 mg total) by mouth 3 (three) times daily. DO NOT take with the hydrocodone What changed:  how much to take when to take this   HYDROcodone-acetaminophen 5-325 MG tablet Commonly known as: Norco Take 1 tablet by mouth every 6 (six) hours as needed for up to 3 days for severe pain.   ketorolac 10 MG tablet Commonly known as: TORADOL Take 10 mg by mouth in the morning and at bedtime.   Magnesium 250 MG Tabs Take 250 mg by mouth daily.   melatonin 3 MG Tabs tablet Take 1 tablet (3 mg total) by mouth at bedtime as needed. What changed: reasons to take this   methylPREDNISolone 4 MG Tbpk tablet Commonly known as: MEDROL DOSEPAK Take as directed on pack starting from day 2   multivitamin capsule Take 1 capsule by mouth daily.   Red Yeast Rice Extract 600 MG Caps Take 1 capsule (600 mg total) by mouth daily.   Turmeric 500 MG Caps Take 500 mg by mouth daily.   Ubiquinol 100 MG Caps Take 100 mg by mouth daily.   vitamin C 1000 MG tablet Take 1 tablet (1,000 mg total) by mouth daily.   VITAMIN D PO Take 1,000 Units by mouth daily.   Zinc 50 MG Caps Take 50 mg by mouth daily.               Durable Medical Equipment  (From admission, onward)           Start     Ordered   07/27/21 1625  For home use only DME 4 wheeled rolling walker with seat  Once       Question:  Patient needs a walker to treat with the following condition  Answer:  Weakness   07/27/21 1625             Follow-up Information     [facility] Follow up.   Specialty: Rehabilitation Why: Call for faster scheduling. A referral has been made electronically for you. Contact information: [address] [id number] [place]        Tonia Ghent, MD. Schedule an appointment as soon as possible for a visit in 1 week(s).   Specialty: Family Medicine Contact information: [place] [address] [phone]         Earnie Larsson, MD. Schedule an appointment as soon as possible for a visit in 2 week(s).   Specialty: Neurosurgery Contact information: [address] [place] [address] [phone]         [facility]. Schedule an appointment as soon as  possible for a visit.   Why: To follow up with Urology Contact information: [place] [phone]                Discharge Exam: Danley Danker Weights   07/26/21 1413  Weight: 66 kg   General: NAD  Cardiovascular: S1, S2 present Respiratory: CTAB Abdomen: Soft, nontender, nondistended, bowel sounds present Musculoskeletal: No bilateral pedal edema noted Skin: Normal Psychiatry: Normal mood   Condition at discharge: stable  The results of significant diagnostics from this hospitalization (including imaging, microbiology, ancillary and laboratory) are listed below for reference.   Imaging Studies: CT Abdomen Pelvis Wo Contrast  Result Date: 07/14/2021 CLINICAL DATA:  Abdominal pain, nausea EXAM: CT ABDOMEN AND PELVIS WITHOUT CONTRAST TECHNIQUE: Multidetector CT imaging of the abdomen and pelvis was performed following the standard protocol without IV contrast. RADIATION DOSE REDUCTION: This exam was performed according to the departmental dose-optimization program which includes automated exposure control,  adjustment of the mA and/or kV according to patient size and/or use of iterative reconstruction technique. COMPARISON:  None. FINDINGS: Lower chest: No acute abnormality. Hepatobiliary: Small hypodensities scattered throughout the liver, difficult to characterize due to their small size and lack of IV contrast but most likely cysts. Gallbladder unremarkable. Pancreas: No focal abnormality or ductal dilatation. Spleen: No focal abnormality.  Normal size. Adrenals/Urinary Tract: 2.7 cm cyst in the midpole of the right kidney. No stones or hydronephrosis. Adrenal glands and urinary bladder unremarkable. Stomach/Bowel: Colonic diverticulosis. No active diverticulitis. Normal appendix. Stomach and small bowel grossly unremarkable. Vascular/Lymphatic: Aortic atherosclerosis. No evidence of aneurysm or adenopathy. Reproductive: Prior hysterectomy.  No adnexal masses. Other: No free fluid or free air. Musculoskeletal: No acute bony abnormality. IMPRESSION: Diffuse colonic diverticulosis.  No active diverticulitis. Aortic atherosclerosis. No acute findings. Electronically Signed   By: Rolm Baptise M.D.   On: 07/14/2021 20:54   CT Lumbar Spine Wo Contrast  Result Date: 07/25/2021 CLINICAL DATA:  Concern for sacral fracture EXAM: CT LUMBAR SPINE WITHOUT CONTRAST TECHNIQUE: Multidetector CT imaging of the lumbar spine was performed without intravenous contrast administration. Multiplanar CT image reconstructions were also generated. RADIATION DOSE REDUCTION: This exam was performed according to the departmental dose-optimization program which includes automated exposure control, adjustment of the mA and/or kV according to patient size and/or use of iterative reconstruction technique. COMPARISON:  None. FINDINGS: Segmentation: 5 lumbar type vertebrae. Alignment: Trace anterolisthesis at L4-L5. Vertebrae: There is no acute lumbar spine fracture. No aggressive osseous lesion. Moderate bilateral SI joint osteoarthritis.  Paraspinal and other soft tissues: Sigmoid diverticulosis. Atherosclerotic calcifications of the aorta. Disc levels: T11-T12: No significant spinal canal or neural foraminal narrowing. T12-L1: No significant spinal canal or neural foraminal narrowing. L1-L2: Shallow left foraminal disc protrusion results in mild left neural foraminal narrowing. No spinal canal stenosis or right neural foraminal narrowing. L2-L3: Broad-based disc bulging, ligamentum flavum hypertrophy bilateral facet arthropathy. No significant spinal canal stenosis. No significant neural foraminal narrowing. There is severe interspinous process degeneration. L3-L4: Asymmetric right disc height loss with left-sided disc bulge and right-sided endplate spurring, ligamentum flavum hypertrophy and severe right-sided facet arthropathy. Mild spinal canal stenosis and mild-to-moderate right neural foraminal stenosis. No left neural foraminal stenosis. There is moderate interspinous process degeneration. L4-L5: Trace anterolisthesis with broad-based disc bulging, ligamentum flavum hypertrophy and bilateral facet arthropathy results in mild spinal canal stenosis and mild right neural foraminal stenosis. No left neural foraminal stenosis. L5-S1: Asymmetric left disc height loss with mild disc  bulging and bilateral facet arthropathy. No spinal canal stenosis. Mild left neural foraminal narrowing. No right neural foraminal narrowing. There are perineural/Tarlov cysts on the right at S1 and bilaterally at S2 with smooth expansion of the neural foramen. IMPRESSION: No acute lumbar spine fracture or evidence of sacral fracture. Multilevel degenerative changes of the lumbar spine, with mild spinal canal stenosis at L3-L4 and L4-L5. Mild-to-moderate right-sided neural foraminal stenosis at L3-L4. Mild right neural foraminal stenosis at L4-L5. Mild left neural foraminal stenosis at L5-S1. Severe interspinous process degeneration at L2-L3 and moderate at L3-L4.  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 Moderate bilateral sacroiliac joint osteoarthritis. Electronically Signed   By: Maurine Simmering M.D.   On: 07/25/2021 11:13   MR CERVICAL SPINE WO CONTRAST  Result Date: 07/26/2021 CLINICAL DATA:  Neck, mid back, and low back pain, with bilateral leg and buttock pain EXAM: MRI CERVICAL, THORACIC AND LUMBAR SPINE WITHOUT CONTRAST TECHNIQUE: Multiplanar and multiecho pulse sequences of the cervical spine, to include the craniocervical junction and cervicothoracic junction, and thoracic and lumbar spine, were obtained without intravenous contrast. COMPARISON:  No prior MRI of the cervical, thoracic, or lumbar spine. Correlation is made with CT lumbar spine 07/25/2021 FINDINGS: MRI CERVICAL SPINE FINDINGS Alignment: Physiologic. Vertebrae: No acute fracture or suspicious osseous lesion. Cord: Normal signal and morphology. Posterior Fossa, vertebral arteries, paraspinal tissues: Negative. Disc levels: C2-C3: No significant disc bulge. No spinal canal stenosis or neuroforaminal narrowing. C3-C4: No significant disc bulge. No spinal canal stenosis or neuroforaminal narrowing. C4-C5: No significant disc bulge. No spinal canal stenosis or neuroforaminal narrowing. C5-C6: No significant disc bulge. No spinal canal stenosis or neuroforaminal narrowing. C6-C7: Disc height loss with mild disc bulge, which indents the ventral spinal cord. Uncovertebral and facet arthropathy. No spinal canal stenosis. Mild bilateral neural foraminal narrowing. C7-T1: No significant disc bulge. No spinal canal stenosis or neuroforaminal narrowing. MRI THORACIC SPINE FINDINGS Alignment: Levocurvature of the upper thoracic spine, dextrocurvature of the mid to lower thoracic spine. No significant antero or retrolisthesis. Vertebrae: No acute fracture or suspicious osseous lesion. Cord:  Normal signal and morphology. Paraspinal and other soft  tissues: Negative. Disc levels: No spinal canal stenosis or neural foraminal narrowing. MRI LUMBAR SPINE FINDINGS Segmentation: 5 lumbar-type vertebral bodies. Partial sacralization of L5 with left L5-S1 pseudoarticulation of a broadened L5 transverse process with the sacrum (series 5, image 17). Alignment: Levocurvature of the lumbar spine. 3 mm anterolisthesis L4 on L5. Vertebrae: No acute fracture or suspicious osseous lesion. Endplate degenerative changes at L3-L4. 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 In the most inferior left Tarlov cyst, there is a fluid fluid level with T1 hypointense and T2 hyperintense material above T1 hypointense and T2 hypointense material, most likely CSF on an additional component, possibly hemorrhage. Conus medullaris and cauda equina: Conus extends to the L1-L2 level. Conus and cauda equina appear normal. Paraspinal and other soft tissues: Bilateral renal cysts. Diverticulosis Disc levels: T12-L1: No significant disc bulge. No spinal canal stenosis or neural foraminal narrowing. L1-L2: Mild disc desiccation and small central disc protrusion. No spinal canal stenosis or neural foraminal narrowing. L2-L3: Mild disc bulge. Mild facet arthropathy. No spinal canal stenosis or neural foraminal narrowing. L3-L4: Mild disc bulge. Mild facet arthropathy. No  spinal canal stenosis or neural foraminal narrowing. L4-L5: Trace anterolisthesis with disc unroofing. Mild disc bulge. Severe facet arthropathy. No spinal canal stenosis or neural foraminal narrowing. L5-S1: Mild disc bulge. Mild facet arthropathy. No spinal canal stenosis or neural foraminal narrowing. IMPRESSION: 1. Tarlov cysts in the sacrum, with fluid-fluid levels, with the dependent portion T1 and T2 hypointense, possibly hemorrhage. No acute abnormality is seen  in the remainder of the spinal canal as a source for the hemorrhage, suggesting a cerebral source. 2. Multifocal curvature of the thoracolumbar spine, without significant spinal canal stenosis or neural foraminal narrowing. 3. C6-C7 mild bilateral neural foraminal narrowing. No spinal canal stenosis or other neural foraminal narrowing in the cervical spine. 4. No acute fracture or suspicious lesion. These results were called by telephone at the time of interpretation on 07/26/2021 at 10:28 pm to provider [facility] , who verbally acknowledged these results. Electronically Signed   By: Merilyn Baba M.D.   On: 07/26/2021 22:28   MR THORACIC SPINE WO CONTRAST  Result Date: 07/26/2021 CLINICAL DATA:  Neck, mid back, and low back pain, with bilateral leg and buttock pain EXAM: MRI CERVICAL, THORACIC AND LUMBAR SPINE WITHOUT CONTRAST TECHNIQUE: Multiplanar and multiecho pulse sequences of the cervical spine, to include the craniocervical junction and cervicothoracic junction, and thoracic and lumbar spine, were obtained without intravenous contrast. COMPARISON:  No prior MRI of the cervical, thoracic, or lumbar spine. Correlation is made with CT lumbar spine 07/25/2021 FINDINGS: MRI CERVICAL SPINE FINDINGS Alignment: Physiologic. Vertebrae: No acute fracture or suspicious osseous lesion. Cord: Normal signal and morphology. Posterior Fossa, vertebral arteries, paraspinal tissues: Negative. Disc levels: C2-C3: No significant disc bulge. No spinal canal stenosis or neuroforaminal narrowing. C3-C4: No significant disc bulge. No spinal canal stenosis or neuroforaminal narrowing. C4-C5: No significant disc bulge. No spinal canal stenosis or neuroforaminal narrowing. C5-C6: No significant disc bulge. No spinal canal stenosis or neuroforaminal narrowing. C6-C7: Disc height loss with mild disc bulge, which indents the ventral spinal cord. Uncovertebral and facet arthropathy. No spinal canal stenosis. Mild bilateral neural  foraminal narrowing. C7-T1: No significant disc bulge. No spinal canal stenosis or neuroforaminal narrowing. MRI THORACIC SPINE FINDINGS Alignment: Levocurvature of the upper thoracic spine, dextrocurvature of the mid to lower thoracic spine. No significant antero or retrolisthesis. Vertebrae: No acute fracture or suspicious osseous lesion. Cord:  Normal signal and morphology. Paraspinal and other soft tissues: Negative. Disc levels: No spinal canal stenosis or neural foraminal narrowing. MRI LUMBAR SPINE FINDINGS Segmentation: 5 lumbar-type vertebral bodies. Partial sacralization of L5 with left L5-S1 pseudoarticulation of a broadened L5 transverse process with the sacrum (series 5, image 17). Alignment: Levocurvature of the lumbar spine. 3 mm anterolisthesis L4 on L5. Vertebrae: No acute fracture or suspicious osseous lesion. Endplate degenerative changes at L3-L4. 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 In the most inferior left Tarlov cyst, there is a fluid fluid level with T1 hypointense and T2 hyperintense material above T1 hypointense and T2 hypointense material, most likely CSF on an additional component, possibly hemorrhage. Conus medullaris and cauda equina: Conus extends to the L1-L2 level. Conus and cauda equina appear normal. Paraspinal and other soft tissues: Bilateral renal cysts. Diverticulosis Disc levels: T12-L1: No significant disc bulge. No spinal canal stenosis or neural foraminal narrowing. L1-L2: Mild disc desiccation and small central  disc protrusion. No spinal canal stenosis or neural foraminal narrowing. L2-L3: Mild disc bulge. Mild facet arthropathy. No spinal canal stenosis or neural foraminal narrowing. L3-L4: Mild disc bulge. Mild facet arthropathy. No spinal canal stenosis or neural foraminal narrowing. L4-L5: Trace  anterolisthesis with disc unroofing. Mild disc bulge. Severe facet arthropathy. No spinal canal stenosis or neural foraminal narrowing. L5-S1: Mild disc bulge. Mild facet arthropathy. No spinal canal stenosis or neural foraminal narrowing. IMPRESSION: 1. Tarlov cysts in the sacrum, with fluid-fluid levels, with the dependent portion T1 and T2 hypointense, possibly hemorrhage. No acute abnormality is seen in the remainder of the spinal canal as a source for the hemorrhage, suggesting a cerebral source. 2. Multifocal curvature of the thoracolumbar spine, without significant spinal canal stenosis or neural foraminal narrowing. 3. C6-C7 mild bilateral neural foraminal narrowing. No spinal canal stenosis or other neural foraminal narrowing in the cervical spine. 4. No acute fracture or suspicious lesion. These results were called by telephone at the time of interpretation on 07/26/2021 at 10:28 pm to provider [facility] , who verbally acknowledged these results. Electronically Signed   By: Merilyn Baba M.D.   On: 07/26/2021 22:28   MR LUMBAR SPINE WO CONTRAST  Result Date: 07/26/2021 CLINICAL DATA:  Neck, mid back, and low back pain, with bilateral leg and buttock pain EXAM: MRI CERVICAL, THORACIC AND LUMBAR SPINE WITHOUT CONTRAST TECHNIQUE: Multiplanar and multiecho pulse sequences of the cervical spine, to include the craniocervical junction and cervicothoracic junction, and thoracic and lumbar spine, were obtained without intravenous contrast. COMPARISON:  No prior MRI of the cervical, thoracic, or lumbar spine. Correlation is made with CT lumbar spine 07/25/2021 FINDINGS: MRI CERVICAL SPINE FINDINGS Alignment: Physiologic. Vertebrae: No acute fracture or suspicious osseous lesion. Cord: Normal signal and morphology. Posterior Fossa, vertebral arteries, paraspinal tissues: Negative. Disc levels: C2-C3: No significant disc bulge. No spinal canal stenosis or neuroforaminal narrowing. C3-C4: No significant disc  bulge. No spinal canal stenosis or neuroforaminal narrowing. C4-C5: No significant disc bulge. No spinal canal stenosis or neuroforaminal narrowing. C5-C6: No significant disc bulge. No spinal canal stenosis or neuroforaminal narrowing. C6-C7: Disc height loss with mild disc bulge, which indents the ventral spinal cord. Uncovertebral and facet arthropathy. No spinal canal stenosis. Mild bilateral neural foraminal narrowing. C7-T1: No significant disc bulge. No spinal canal stenosis or neuroforaminal narrowing. MRI THORACIC SPINE FINDINGS Alignment: Levocurvature of the upper thoracic spine, dextrocurvature of the mid to lower thoracic spine. No significant antero or retrolisthesis. Vertebrae: No acute fracture or suspicious osseous lesion. Cord:  Normal signal and morphology. Paraspinal and other soft tissues: Negative. Disc levels: No spinal canal stenosis or neural foraminal narrowing. MRI LUMBAR SPINE FINDINGS Segmentation: 5 lumbar-type vertebral bodies. Partial sacralization of L5 with left L5-S1 pseudoarticulation of a broadened L5 transverse process with the sacrum (series 5, image 17). Alignment: Levocurvature of the lumbar spine. 3 mm anterolisthesis L4 on L5. Vertebrae: No acute fracture or suspicious osseous lesion. Endplate degenerative changes at L3-L4. 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 In the most inferior left Tarlov cyst, there is a fluid fluid level with T1 hypointense and T2 hyperintense material above T1 hypointense and T2 hypointense material, most likely CSF on an additional component, possibly hemorrhage. Conus medullaris and cauda equina: Conus extends to the L1-L2 level. Conus and cauda equina  appear normal. Paraspinal and other soft tissues: Bilateral renal cysts. Diverticulosis Disc levels: T12-L1: No significant disc  bulge. No spinal canal stenosis or neural foraminal narrowing. L1-L2: Mild disc desiccation and small central disc protrusion. No spinal canal stenosis or neural foraminal narrowing. L2-L3: Mild disc bulge. Mild facet arthropathy. No spinal canal stenosis or neural foraminal narrowing. L3-L4: Mild disc bulge. Mild facet arthropathy. No spinal canal stenosis or neural foraminal narrowing. L4-L5: Trace anterolisthesis with disc unroofing. Mild disc bulge. Severe facet arthropathy. No spinal canal stenosis or neural foraminal narrowing. L5-S1: Mild disc bulge. Mild facet arthropathy. No spinal canal stenosis or neural foraminal narrowing. IMPRESSION: 1. Tarlov cysts in the sacrum, with fluid-fluid levels, with the dependent portion T1 and T2 hypointense, possibly hemorrhage. No acute abnormality is seen in the remainder of the spinal canal as a source for the hemorrhage, suggesting a cerebral source. 2. Multifocal curvature of the thoracolumbar spine, without significant spinal canal stenosis or neural foraminal narrowing. 3. C6-C7 mild bilateral neural foraminal narrowing. No spinal canal stenosis or other neural foraminal narrowing in the cervical spine. 4. No acute fracture or suspicious lesion. These results were called by telephone at the time of interpretation on 07/26/2021 at 10:28 pm to provider [facility] , who verbally acknowledged these results. Electronically Signed   By: Merilyn Baba M.D.   On: 07/26/2021 22:28   CT Abdomen Pelvis W Contrast  Result Date: 07/16/2021 CLINICAL DATA:  Abdominal pain, acute, nonlocalized. EXAM: CT ABDOMEN AND PELVIS WITH CONTRAST TECHNIQUE: Multidetector CT imaging of the abdomen and pelvis was performed using the standard protocol following bolus administration of intravenous contrast. RADIATION DOSE REDUCTION: This exam was performed according to the departmental dose-optimization program which includes automated exposure control, adjustment of the mA and/or kV  according to patient size and/or use of iterative reconstruction technique. CONTRAST:  176mL OMNIPAQUE IOHEXOL 300 MG/ML  SOLN COMPARISON:  CT abdomen pelvis without contrast 07/14/2021 FINDINGS: Lower chest: The lung bases are clear without focal nodule, mass, or airspace disease. Heart size is normal. No significant pleural or pericardial effusion is present. Hepatobiliary: 7 mm simple cyst is present in the liver on image 12 of series 3 no other focal hepatic lesions are present. Stranding is noted about the gallbladder with potential pericholecystic fluid, not present 2 days ago. The common bile duct is within normal limits for age. Pancreas: Unremarkable. No pancreatic ductal dilatation or surrounding inflammatory changes. Spleen: Normal in size without focal abnormality. Adrenals/Urinary Tract: An 8 mm simple cyst is present in the left kidney on image 13 of series 8. Lower pole exophytic cyst is present in the right kidney on image 18, measuring 29 mm. No other solid lesions are present. No stone or obstruction is present. Ureters are within normal limits bilaterally. The urinary bladder is unremarkable. Stomach/Bowel: Stomach and duodenum are within normal limits. Small bowel is unremarkable. Terminal ileum is within normal limits. The appendix is visualized and normal. Diverticular changes are present in the ascending colon and distal descending and sigmoid colon without inflammation to suggest diverticulitis. Colon is otherwise unremarkable. Vascular/Lymphatic: Atherosclerotic calcifications are present in the aorta and branch vessels without aneurysm. No significant adenopathy is present. Reproductive: Status post hysterectomy. No adnexal masses. Other: No abdominal wall hernia or abnormality. No abdominopelvic ascites. Musculoskeletal: Degenerative endplate changes are greatest on the right at L3-4 secondary to leftward curvature. Vertebral body heights otherwise within normal limits. Are left cyst noted  at S2. Facet degenerative changes are greatest at L5-S1.  No focal lytic or blastic lesions are present. IMPRESSION: 1. Stranding about the gallbladder with potential pericholecystic fluid, not present 2 days ago. This raises concern for acute cholecystitis. Right upper quadrant ultrasound could be used for further evaluation. 2. Colonic diverticulosis without diverticulitis. 3. Benign-appearing hepatic and renal cysts. 4. Aortic Atherosclerosis (ICD10-I70.0). Electronically Signed   By: San Morelle M.D.   On: 07/16/2021 09:05   US RENAL  Result Date: 07/27/2021 CLINICAL DATA:  Urinary retention. EXAM: RENAL / URINARY TRACT ULTRASOUND COMPLETE COMPARISON:  None. FINDINGS: Right Kidney: Renal measurements: 9.9 x 4.9 x 4.3 cm = volume: 107 mL. Echogenicity within normal limits. Single lower pole cyst measures 2.7 cm. No suspicious mass or hydronephrosis. Left Kidney: Renal measurements: 10.1 x 5 x 4.1 cm = volume: 107 mL. Echogenicity within normal limits. Mild hydronephrosis. Bladder: Appears normal for degree of bladder distention. Other: None. IMPRESSION: Mild LEFT-sided hydronephrosis. Electronically Signed   By: Franki Cabot M.D.   On: 07/27/2021 14:14   DG Hip Unilat W or Wo Pelvis 2-3 Views Left  Result Date: 07/25/2021 CLINICAL DATA:  Left hip pain EXAM: DG HIP (WITH OR WITHOUT PELVIS) 2-3V LEFT COMPARISON:  None. FINDINGS: No acute fracture or dislocation identified. Mild narrowing of the hip joint space with mild acetabular subchondral sclerosis. Soft tissues are unremarkable. IMPRESSION: No acute osseous abnormality identified. Electronically Signed   By: Ofilia Neas M.D.   On: 07/25/2021 10:50   DG Hip Unilat W or Wo Pelvis 2-3 Views Right  Result Date: 07/25/2021 CLINICAL DATA:  Right hip pain. EXAM: DG HIP (WITH OR WITHOUT PELVIS) 2-3V RIGHT COMPARISON:  None. FINDINGS: There is no evidence of hip fracture or dislocation. There is no evidence of arthropathy or other focal bone  abnormality. IMPRESSION: No acute osseous abnormality identified. Electronically Signed   By: Ofilia Neas M.D.   On: 07/25/2021 10:46   US Abdomen Limited RUQ (LIVER/GB)  Result Date: 07/16/2021 CLINICAL DATA:  Right upper quadrant pain EXAM: ULTRASOUND ABDOMEN LIMITED RIGHT UPPER QUADRANT COMPARISON:  Semi CT abdomen and pelvis dated July 16, 2021 FINDINGS: Gallbladder: No gallstones or wall thickening visualized. Trace pericholecystic fluid. No sonographic Murphy sign noted by sonographer. Common bile duct: Diameter: 6 mm Liver: No suspicious focal lesion identified. Simple cyst of the right lower the liver measuring 1.1 x 1.0 x 1.0 cm. Within normal limits in parenchymal echogenicity. Portal vein is patent on color Doppler imaging with normal direction of blood flow towards the liver. Other: Incidental note is made of a simple cyst of the lower pole the right kidney with a single septation measuring 2.1 x 2.4 x 2.6 cm. IMPRESSION: Trace pericholecystic fluid with no evidence of gallbladder wall thickening and negative sonographic Murphy sign, findings are equivocal for acute cholecystitis. HIDA scan could be performed if there is continued clinical concern. Electronically Signed   By: Yetta Glassman M.D.   On: 07/16/2021 12:14    Microbiology: Results for orders placed or performed during the hospital encounter of 07/26/21  Resp Panel by RT-PCR (Flu A&B, Covid) Nasopharyngeal Swab     Status: None   Collection Time: 07/26/21  6:06 PM   Specimen: Nasopharyngeal Swab; Nasopharyngeal(NP) swabs in vial transport medium  Result Value Ref Range Status   SARS Coronavirus 2 by RT PCR NEGATIVE NEGATIVE Final    Comment: (NOTE) SARS-CoV-2 target nucleic acids are NOT DETECTED.  The SARS-CoV-2 RNA is generally detectable in upper respiratory specimens during the acute phase of infection. The lowest  concentration of SARS-CoV-2 viral copies this assay can detect is 138 copies/mL. A negative  result does not preclude SARS-Cov-2 infection and should not be used as the sole basis for treatment or other patient management decisions. A negative result may occur with  improper specimen collection/handling, submission of specimen other than nasopharyngeal swab, presence of viral mutation(s) within the areas targeted by this assay, and inadequate number of viral copies(<138 copies/mL). A negative result must be combined with clinical observations, patient history, and epidemiological information. The expected result is Negative.  Fact Sheet for Patients:  EntrepreneurPulse.com.au  Fact Sheet for Healthcare Providers:  IncredibleEmployment.be  This test is no t yet approved or cleared by the [place] FDA and  has been authorized for detection and/or diagnosis of SARS-CoV-2 by FDA under an Emergency Use Authorization (EUA). This EUA will remain  in effect (meaning this test can be used) for the duration of the COVID-19 declaration under Section 564(b)(1) of the Act, 21 U.S.C.section 360bbb-3(b)(1), unless the authorization is terminated  or revoked sooner.       Influenza A by PCR NEGATIVE NEGATIVE Final   Influenza B by PCR NEGATIVE NEGATIVE Final    Comment: (NOTE) The Xpert Xpress SARS-CoV-2/FLU/RSV plus assay is intended as an aid in the diagnosis of influenza from Nasopharyngeal swab specimens and should not be used as a sole basis for treatment. Nasal washings and aspirates are unacceptable for Xpert Xpress SARS-CoV-2/FLU/RSV testing.  Fact Sheet for Patients: EntrepreneurPulse.com.au  Fact Sheet for Healthcare Providers: IncredibleEmployment.be  This test is not yet approved or cleared by the [place] FDA and has been authorized for detection and/or diagnosis of SARS-CoV-2 by FDA under an Emergency Use Authorization (EUA). This EUA will remain in effect (meaning this test can be used)  for the duration of the COVID-19 declaration under Section 564(b)(1) of the Act, 21 U.S.C. section 360bbb-3(b)(1), unless the authorization is terminated or revoked.  Performed at [facility], [place] [address], [place], [place] [address]     Labs: CBC: Recent Labs  Lab 07/26/21 1812 07/27/21 0058 07/28/21 0032  WBC 13.3* 12.7* 9.0  NEUTROABS 8.1* 7.1 7.7  HGB 15.4* 14.9 14.5  HCT 45.9 45.1 42.5  MCV 88.3 88.8 88.4  PLT 320 288 027   Basic Metabolic Panel: Recent Labs  Lab 07/26/21 1812 07/28/21 0032  NA 135 136  K 4.1 4.4  CL 99 101  CO2 27 26  GLUCOSE 104* 136*  BUN 19 17  CREATININE 0.72 0.71  CALCIUM 9.0 8.9   Liver Function Tests: No results for input(s): AST, ALT, ALKPHOS, BILITOT, PROT, ALBUMIN in the last 168 hours. CBG: No results for input(s): GLUCAP in the last 168 hours.  Discharge time spent: less than 30 minutes.  Signed: Alma Friendly, MD [facility] 07/28/2021

## 2021-07-28 NOTE — TOC Transition Note (Signed)
Transition of Care [facility]) - CM/SW Discharge Note   Patient Details  Name: Desiree Ortega MRN: [id number] Date of Birth: 1945-07-17  Transition [facility]) CM/SW Contact:  Pollie Friar, RN Phone Number: 07/28/2021, 10:24 AM   Clinical Narrative:    Pt has outpatient therapy arrange through [facility].  Rollator for home has not been delivered to the room. CM has sent message to [facility].  Pt has assistance at home and transportation to home.    Final next level of care: OP Rehab Barriers to Discharge: No Barriers Identified   Patient Goals and CMS Choice Patient states their goals for this hospitalization and ongoing recovery are:: return home CMS Medicare.gov Compare Post Acute Care list provided to:: Patient Choice offered to / list presented to : Patient  Discharge Placement                       Discharge Plan and Services                DME Arranged: Walker rolling with seat DME Agency: [employer] Date DME Agency Contacted: 07/27/21 Time DME Agency Contacted: [phone] Representative spoke with at DME Agency: [place] Interventions     Readmission Risk Interventions No flowsheet data found.

## 2021-07-29 NOTE — Telephone Encounter (Signed)
Patient notified referral was done and patient states she was also going to keep trying to get them on the phone for an appt.

## 2021-08-04 ENCOUNTER — Telehealth: Payer: Self-pay | Admitting: Family Medicine

## 2021-08-04 MED ORDER — HYDROCODONE-ACETAMINOPHEN 5-325 MG PO TABS: 1.0000 | ORAL_TABLET | Freq: Four times a day (QID) | ORAL | 0 refills | Status: DC | PRN

## 2021-08-04 NOTE — Addendum Note (Signed)
Addended by: Tonia Ghent on: 08/04/2021 01:53 PM   Modules accepted: Orders

## 2021-08-04 NOTE — Telephone Encounter (Signed)
Message received from access nurse below. The only pain medications I see in chart was from ED visit on 07/26/21 she was given [place] RECORD AccessNurse Patient Name: Desiree Ortega [facility] Gender: Female DOB: 1945/08/02 Age: 76 Y 30 M 3 D Return Phone Number: [phone] (Primary) Address: City/ State/ Zip: [place] [address] Client [place] Night - Client Client Site [place] - Night Contact Type Call Who Is Calling Patient / Member / Family / Caregiver Call Type Triage / Clinical Relationship To Patient Self Return Phone Number [phone] (Primary) Chief Complaint Leg Pain Reason for Call Medication Question / Request Initial Comment Caller states ran out of a pain medication and wants to know if Dr can give meds till she sees him on Tuesday. Dr Elsie Stain.SX Severe pain im back of ger legs and Butt. She is completley out of medication. Translation No Nurse Assessment Nurse: Harvie Bridge, RN, Beth Date/Time (Eastern Time): 08/03/2021 9:29:33 AM Confirm and document reason for call. If symptomatic, describe symptoms. ---Caller states ran out of a pain medication: Hydrocodoe 325/5 Gabapentin 100 both controlled substance, and wants to know if Dr can give meds till she sees him on Tuesday. Dr Elsie Stain. SX Severe pain in back of her legs and Butt. She is completely out of medication. Client Directives: Do not call for Meds after-hours. Does the patient have any new or worsening symptoms? ---Yes Will a triage be completed? ---Yes Related visit to physician within the last 2 weeks? ---Yes Does the PT have any chronic conditions? (i.e. diabetes, asthma, this includes High risk factors for pregnancy, etc.) ---No Is this a behavioral health or substance abuse call? ---No Guidelines Guideline Title Affirmed Question Affirmed Notes Nurse Date/Time  Eilene Ghazi Time) Leg Pain [1] MODERATE pain (e.g., interferes with normal activities, limping) AND [2] present > 3 days Newhart, RN, [facility] 08/03/2021 9:33:23 AM PLEASE NOTE: All timestamps contained within this report are represented as [place] Standard Time. CONFIDENTIALTY NOTICE: This fax transmission is intended only for the addressee. It contains information that is legally privileged, confidential or otherwise protected from use or disclosure. If you are not the intended recipient, you are strictly prohibited from reviewing, disclosing, copying using or disseminating any of this information or taking any action in reliance on or regarding this information. If you have received this fax in error, please notify us immediately by telephone so that we can arrange for its return to [place]. Phone: [phone], Toll-Free: [phone], Fax: [phone] Page: 2 of 2 Call Id: [id number] [place]. Time Eilene Ghazi Time) Disposition Final User 08/03/2021 9:37:36 AM SEE PCP WITHIN 3 DAYS Yes Newhart, RN, Beth Caller Disagree/Comply Comply Caller Understands Yes PreDisposition Did not know what to do Care Advice Given Per Guideline SEE PCP WITHIN 3 DAYS: * You need to be seen within 2 or 3 days. PAIN MEDICINES: * ACETAMINOPHEN - REGULAR STRENGTH TYLENOL: Take 650 mg (two 325 mg pills) by mouth every 4 to 6 hours as needed. Each Regular Strength Tylenol pill has 325 mg of acetaminophen. The most you should take is 10 pills a day (3,250 mg total). Note: In [place], the maximum is 12 pills a day (3,900 mg total). * IBUPROFEN (E.G., MOTRIN, ADVIL): Take 400 mg (two 200 mg pills) by mouth every 6 hours. The most you should take is 6 pills a day (1,200 mg total). CALL BACK IF: * Severe pain occurs * Calf swelling  or constant calf pain occurs * Signs of infection occur (e.g., spreading redness, warmth, fever) * You become worse CARE ADVICE given per Leg Pain (Adult) guideline. Comments User: Louretta Shorten, RN  Date/Time Eilene Ghazi Time): 08/03/2021 9:37:10 AM Been to ED 4 times, hospitalized once. CT Scan and MRI done. All negative. Referrals REFERRED TO PCP OFFICE

## 2021-08-04 NOTE — Telephone Encounter (Signed)
Spoke with patient and she states she is no better; still in a lot of pain and has no answers as to why or what is causing this. Patient has appt tomorrow with Dr. Damita Dunnings and will discuss further with him.

## 2021-08-04 NOTE — Telephone Encounter (Signed)
I sent the refill for norco but please get an update on patient.  Thanks.

## 2021-08-05 ENCOUNTER — Other Ambulatory Visit: Payer: Self-pay

## 2021-08-05 ENCOUNTER — Ambulatory Visit (INDEPENDENT_AMBULATORY_CARE_PROVIDER_SITE_OTHER): Payer: Medicare HMO | Admitting: Family Medicine

## 2021-08-05 ENCOUNTER — Encounter: Payer: Self-pay | Admitting: Family Medicine

## 2021-08-05 VITALS — BP 160/80 | HR 85 | Temp 96.7°F | Ht 66.0 in | Wt 141.0 lb

## 2021-08-05 DIAGNOSIS — M79604 Pain in right leg: Secondary | ICD-10-CM | POA: Diagnosis not present

## 2021-08-05 DIAGNOSIS — M79605 Pain in left leg: Secondary | ICD-10-CM

## 2021-08-05 DIAGNOSIS — R519 Headache, unspecified: Secondary | ICD-10-CM | POA: Diagnosis not present

## 2021-08-05 MED ORDER — METHYLPREDNISOLONE 4 MG PO TBPK: ORAL_TABLET | ORAL | 0 refills | Status: DC

## 2021-08-05 MED ORDER — HYDROCODONE-ACETAMINOPHEN 5-325 MG PO TABS: 1.0000 | ORAL_TABLET | Freq: Four times a day (QID) | ORAL | 0 refills | Status: DC | PRN

## 2021-08-05 NOTE — Patient Instructions (Addendum)
Keep the appointment with urology.  I would get the head CT done.  I ordered that today and we'll work on getting it set up.   I think it makes sense to see neurosurgery.  I put in the referral.   I would restart prednisone.  I sent the rx.   Take care.  Glad to see you.

## 2021-08-05 NOTE — Progress Notes (Signed)
This visit occurred during the SARS-CoV-2 public health emergency.  Safety protocols were in place, including screening questions prior to the visit, additional usage of staff PPE, and extensive cleaning of exam room while observing appropriate contact time as indicated for disinfecting solutions.  Recent emergency room and inpatient notes discussed with patient.  She has had recurrent leg pain.  Sx started with L lower abd pain.  She had bladder surgery prev.  D/w pt.  She has urology f/u pending.    She has posterior B thigh pain.  Not anterior pain.   Hydrocodone helps a little but doesn't resolve the pain.    She has normal neck ROM but pain in L leg with neck rotation to the L side.  She has normal chin to chest ROM but some pain in L leg with that.   She had spinal injection many years in the past with childbirth but no recent injections.  She had prev paresthesias in the hands and in the legs.    She is done with steroids now, and that may have helped some.  She has used gabapentin prev but hydrocodone helped more with pain.    She had prev HA with this episode.  Recheck BP 160/80 today at clinic.     Previous imaging discussed with patient. Pertinent items noted: Mild LEFT-sided hydronephrosis.    IMPRESSION: 1. Tarlov cysts in the sacrum, with fluid-fluid levels, with the dependent portion T1 and T2 hypointense, possibly hemorrhage. No acute abnormality is seen in the remainder of the spinal canal as a source for the hemorrhage, suggesting a cerebral source. 2. Multifocal curvature of the thoracolumbar spine, without significant spinal canal stenosis or neural foraminal narrowing. 3. C6-C7 mild bilateral neural foraminal narrowing. No spinal canal stenosis or other neural foraminal narrowing in the cervical spine. 4. No acute fracture or suspicious lesion.     IMPRESSION: No acute lumbar spine fracture or evidence of sacral fracture.   Multilevel degenerative changes of  the lumbar spine, with mild spinal canal stenosis at L3-L4 and L4-L5. Mild-to-moderate right-sided neural foraminal stenosis at L3-L4. Mild right neural foraminal stenosis at L4-L5. Mild left neural foraminal stenosis at L5-S1.   Severe interspinous process degeneration at L2-L3 and moderate at L3-L4.   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   Moderate bilateral sacroiliac joint osteoarthritis.  ==========================================   Discharge Diagnoses: Principal Problem:   Leg pain, bilateral Active Problems:   HLD (hyperlipidemia)   Leukocytosis   Radiculopathy   Elevated blood pressure reading         Hospital Course: Desiree Ortega is a 76 y.o. female with medical history significant of HLD, nephrolithiasis, and skin cancer presents with complaints of bilateral lower extremity pain over the last 12 days. Patient reported persisted pain in her hips that radiates down the back of her leg to her knee and is worse on the right than on the left. Pt also reported bending her neck forward produces a sharp shooting pain running down her neck. Sitting makes symptoms worse. Denies any recent falls, trauma, or heavy lifting to onset symptoms, denies any saddle anesthesia. Reports difficulty urinating. CT scan of the lumbar spine which showed degenerative changes with foraminal stenosis multilevel degenerative changes with stenosis present. In the ED patient was noted to be hypertensive up to 183/87. Labs fairly stable except for mild leukocytosis. Neurosurgery consulted. Patient admitted for further management.  Today, patient reports feeling better, denies any worsening bilateral lower extremity pain, chest pain, abdominal pain, nausea/vomiting, fever/chills.  Patient was able to urinate, with good stream, but does feel like she has incomplete emptying.  Advised patient to follow-up with urology as an  outpatient, also follow-up with PCP in 1 week and neurosurgery in 2 weeks.       Assessment and Plan:   Bilateral leg pain likely 2/2 ?unknown radiculopathy  Pain running down both legs worse on the right especially with certain positions CT imaging of the lumbar spine had noted multilevel degenerative disease with spinal and foraminal stenosis MRI cervical/lumbar/thoracic spine showed tarlov cysts in the sacrum, with fluid-filled levels.  No other acute abnormality seen Neurosurgery consulted, tarlov cysts are benign findings, no neurosurgical interventions.  Recommend oral steroid Dosepak and Neurontin, PT and follow-up in a couple of weeks Pain management   Elevated blood pressure reading- (present on admission) In the ED, SBP noted to be in 180s Not on any BP meds at home, likely worsened by pain BP somewhat stable Follow-up with PCP   Leukocytosis- (present on admission) Resolved Currently afebrile Possibly reactive UA unremarkable for infection   Urinary retention Unknown etiology, discussed with neurosurgery, not related to MRI findings/patient's complaints Reports remote history Renal ultrasound showed normal bladder distention, mild left-sided hydronephrosis Follow-up with urology as an outpatient   HLD (hyperlipidemia)- (present on admission) Last available lipid panel from 08/19/2020, with LDL of 174 Not on any meds Outpatient follow up     ==================================================   Meds, vitals, and allergies reviewed.   ROS: Per HPI unless specifically indicated in ROS section    GEN: nad, alert and oriented, laying on the exam table since this is more comfortable for patient.  She is able to bear weight but has pain with initiation of walking.   HEENT: ncat NECK: supple w/o LA CV: rrr.   PULM: ctab, no inc wob ABD: soft, +bs EXT: no edema SKIN: well perfused.  She has normal neck ROM but pain in L leg with neck rotation to the L side.  She  has normal chin to chest ROM but some pain in L leg with that.  Strength and sensation grossly intact in extremities x4.  Speech normal. CN 2-12 wnl B

## 2021-08-06 DIAGNOSIS — R338 Other retention of urine: Secondary | ICD-10-CM | POA: Diagnosis not present

## 2021-08-07 ENCOUNTER — Encounter: Payer: Self-pay | Admitting: Family Medicine

## 2021-08-09 ENCOUNTER — Encounter: Payer: Self-pay | Admitting: Family Medicine

## 2021-08-09 NOTE — Assessment & Plan Note (Signed)
Of unclear source.  This could be from her back.  She does not have much back pain but she has degenerative changes in her back.  She also has hydronephrosis noted with urology follow-up pending.  Taking steroids may have helped some.  She has used gabapentin and hydrocodone for pain.  It seems like hydrocodone helpful with the pain.  Discussed options.  I suspect her headache was related to elevated blood pressure and I suspect the elevated blood pressure was related to pain, but I would still check a head CT to look for another cause of her headache or any contribution to her leg pain.  Her cranial nerves are intact at the exam and she still okay for outpatient follow-up.  Refill sent for hydrocodone, restart steroids in the meantime with routine steroid cautions and refer to neurosurgery.  See orders.  I still don't have a clear explanation for all of her symptoms and we talked about that.  This appears to be a reasonable way to work-up her situation.  She agrees with plan.  45 minutes were devoted to patient care in this encounter (this includes time spent reviewing the patient's file/history, interviewing and examining the patient, counseling/reviewing plan with patient).

## 2021-08-10 ENCOUNTER — Encounter: Payer: Self-pay | Admitting: Family Medicine

## 2021-08-12 ENCOUNTER — Other Ambulatory Visit: Payer: Self-pay

## 2021-08-12 ENCOUNTER — Ambulatory Visit: Payer: Medicare HMO | Attending: Internal Medicine | Admitting: Physical Therapy

## 2021-08-12 ENCOUNTER — Encounter: Payer: Self-pay | Admitting: Physical Therapy

## 2021-08-12 DIAGNOSIS — R2681 Unsteadiness on feet: Secondary | ICD-10-CM | POA: Diagnosis not present

## 2021-08-12 DIAGNOSIS — M5416 Radiculopathy, lumbar region: Secondary | ICD-10-CM | POA: Diagnosis not present

## 2021-08-12 DIAGNOSIS — M79604 Pain in right leg: Secondary | ICD-10-CM | POA: Diagnosis not present

## 2021-08-12 DIAGNOSIS — R269 Unspecified abnormalities of gait and mobility: Secondary | ICD-10-CM | POA: Insufficient documentation

## 2021-08-12 DIAGNOSIS — M6281 Muscle weakness (generalized): Secondary | ICD-10-CM | POA: Insufficient documentation

## 2021-08-12 DIAGNOSIS — M79605 Pain in left leg: Secondary | ICD-10-CM | POA: Insufficient documentation

## 2021-08-12 NOTE — Therapy (Addendum)
[place] [address], [place], [address] Phone: [phone]   Fax:  [phone]  Physical Therapy Evaluation  Patient Details  Name: Desiree Ortega MRN: [id number] Date of Birth: [birth year] No data recorded  Encounter Date: 08/12/2021   PT End of Session - 08/12/21 1543     Visit Number 1    Number of Visits 17    Authorization - Visit Number 1    Authorization - Number of Visits 17    Progress Note Due on Visit 10    PT Start Time 5631    PT Stop Time 1430    PT Time Calculation (min) 45 min    Activity Tolerance Patient tolerated treatment well    Behavior During Therapy [facility] for tasks assessed/performed             Past Medical History:  Diagnosis Date   Allergy    Cancer ([place]) on back,leg,tailbone   prev squam and basal cell skin CA removed- Dr. Evorn Gong   Diverticulosis    Moderate   Female bladder prolapse    H/O cold sores    History of colon polyps    History of kidney stones    Hyperlipidemia    Migraine with aura    Osteopenia    prev on fosamax for a few years then started evista at age ~86, T score improved  from -2.29/-1.96 to -1.8/-1.7 as of 06/2011, repeat DXA done 2015    Past Surgical History:  Procedure Laterality Date   BREAST BIOPSY Left    benign   CATARACT EXTRACTION W/ INTRAOCULAR LENS IMPLANT Left 11/2016   CATARACT EXTRACTION W/ INTRAOCULAR LENS IMPLANT Right 12/22/2016   COLONOSCOPY  08/15/2013   cyst on back     2 times   cyst on eyelid     right eye   CYSTOCELE REPAIR N/A 02/01/2018   Procedure: ANTERIOR REPAIR (CYSTOCELE);  Surgeon: Bjorn Loser, MD;  Location: [facility];  Service: Urology;  Laterality: N/A;   CYSTOSCOPY N/A 02/01/2018   Procedure: cystoscopy;  Surgeon: Bjorn Loser, MD;  Location: [facility];  Service: Urology;  Laterality: N/A;   LAPAROSCOPIC VAGINAL HYSTERECTOMY WITH SALPINGO OOPHORECTOMY Bilateral 02/01/2018   Procedure:  LAPAROSCOPIC ASSISTED VAGINAL HYSTERECTOMY WITH BILATERAL SALPINGO OOPHORECTOMY;  Surgeon: Servando Salina, MD;  Location: [facility];  Service: Gynecology;  Laterality: Bilateral;   SQUAMOUS CELL CARCINOMA EXCISION     on back/removed 2 times/and thigh   SQUAMOUS CELL CARCINOMA EXCISION Left 07/15/2016   left lower calf   SSC removed  1988   Birthmark removal partial 1/3   TONSILLECTOMY  1950   TUBAL LIGATION  1980's    There were no vitals filed for this visit.    Subjective Assessment - 08/12/21 1334     Subjective 76 y.o. female pnt presents with bilateral hip pain with radiating symptoms down the back of bilateral legs to her knees    Pertinent History 76 y.o. female pnt presents with bilateral hip pain with radiating symptoms down the back of bilateral legs to her knees that began Jan. 23rd with insidious onset. Pnt descibes pain as "burning" in her hips with best a 0/10 (on pain medication), worst 10/10, and currently 5/10 on NPS. Pnt is limited in her usual recreational activities (yoga, walking, etc) and was previously very active. Her home setup includes 14 stairs with bilateral railing and a shower with a railing for balance that she installed for her  husband following his stroke. She currently lives with her husband. Aggrevating factors include sitting for more than 1 minute, walking and/or standing for more than 30 minutes, bed mobility, and getting into and out of a car. Easing factors include laying flat on her back or in hooklying, reclining in a chair, and taking medication. Pnt is scheduled for a consultation with a neurologist on March 1st and currently has a catheter (due to urinary retention) which she reports is helping her pain. Pnt has had pelvic floor PT in the past and would be interested in that option if it is more appropriate. Pnt denys saddle anaesthesia, n/v, fever, or night pain. Pnt has noticed she has lost about 8 pounds since last January. Her goal for physical  therapy is to decrease her pain.    Limitations Sitting;Walking;Lifting;House hold activities;Standing    How long can you sit comfortably? 1 minute    How long can you stand comfortably? 30 min    How long can you walk comfortably? 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 Yes    Pain Score 5     Pain Location Hip    Pain Orientation Right;Left;Posterior    Pain Descriptors / Indicators Burning    Pain Type Chronic pain    Pain Radiating Towards knees    Pain Onset More than a month ago    Pain Frequency Intermittent    Aggravating Factors  Pnt aggrevating factors include sitting for more than 1 minute, walking for more than 30 minutes, standing for more than 30 minutes, bed mobility, and getting into and out of a car.    Pain Relieving Factors Easing factors include laying flat on her back and in hooklying, reclining in a chair, and taking medication.    Effect of Pain on Daily Activities limits ADLs, hobbies and active lifestyle              Posture: Upper crossed syndrome Rounded Shoulders  Lower crossed syndrome *able to correct with cueing  Gait:  Kyphotic Posture maintained throughout gait Self selected gait speed is .7 m/s Step through pattern Decreased step length *with RW  LQNS: *deferred due to pain with sitting  Dermatomes Myotomes Reflexes  Lumbar ROM/OP Flexion: WNL *concordant pain down the back of legs Extension: WNL  Lateral Side Bending: WNL Rotation: WNL  Repeated Motions Flexion: not significant  Extension: not significant  Prone Prop up: *significant symptom relief   Hip ROM/OP Flexion: WNL Abduction: WNL IR: deferred due to pain sitting  ER: deferred due to pain sitting  Extension: deferred due to time Adduction: deferred due to time   Hip MMT Abduction: bilateral 4/5 Flexion: bilateral 3/5 Extension: deferred due to  time  IR: deferred due to pain sitting  ER: deferred due to pain sitting   Muscle Length Hamstring: WNL Psoas: WNL Rectus Femoris: WNL  Palpation Deferred due to time   Sit to Stand Assessment: Gower's sign  Uses RW to assist   10 Meter Walk Test:  .7 m/s with RW  *concordant pain down back of legs   SLS: L: 9.16; R: 20  Slump Test- bilateral positive  SLR- bilateral positive CSLR- negative  Thomas Test: bilateral negative  Therex Supine Sciatic nerve glide x12 Double Knee to Chest Stretch x12 Prone Prop 2 min  Next Session: Inquire why she uses RW  5x  sit to stand  6 min walk test         Objective measurements completed on examination: See above findings.                PT Education - 08/12/21 1542     Education Details dx, prognosis, HEP    Person(s) Educated Patient    Methods Explanation;Demonstration;Verbal cues    Comprehension Verbalized understanding;Returned demonstration              PT Short Term Goals - 08/12/21 1557       PT SHORT TERM GOAL #1   Title Pnt will be fully independent with HEP in order to progress at home to comeplete ADLs.    Baseline 08/12/21 HEP given    Time 4    Period Weeks    Status New    Target Date 09/09/21               PT Long Term Goals - 08/12/21 1558       PT LONG TERM GOAL #1   Title Pnt will decrease max NPS value from 10/10 to 8/10 to show clinically significant reduction of pain.    Baseline 08/12/21 10/10NPS    Time 8    Period Weeks    Status New    Target Date 10/07/21      PT LONG TERM GOAL #2   Title Pnt will increase 10 meter walk test with RW to 1.26-1.13 m/s in order to meet age norm value to show safe community ambulation.    Baseline 08/12/21 .61m/s    Time 8    Period Weeks    Status New    Target Date 10/07/21      PT LONG TERM GOAL #3   Title Pnt will increase SLS time on left leg to >10sec in order to show clinically significant decrease in community fall  risk.    Baseline 08/12/21 L: 9.16 seconds    Time 8    Period Weeks    Status New    Target Date 10/07/21      PT LONG TERM GOAL #4   Title Pnt will increase bilateral hip flexion MMT by one muscle grade in order to show significant LE strength gain to complete functional and community ADLs.    Baseline 08/12/21 bilateral 3/5    Time 8    Period Weeks    Status New    Target Date 10/07/21                    Plan - 08/12/21 1543     Clinical Impression Statement Pnt presented with chronic bilateral hip pain with radiating sx toward bilateral knees. Impairments include decreased bilateral hip flexion strength, decreased static/dynamic balance, decreased gait speed, and pain in bilateral hips radiating towards kness. The listed deficits decrease patient ability to complete functional ADLs such as bed mobility, getting into/out of her car, walking, negotiating the full flight of stairs in her home, sitting down to eat a meal, and safe community and household ambulation . Listed deficits also limit pnt participation in yoga and exercise.  Pnt verbalized and demonstrated understanding of PT instruction and has good rehab potential. Further strength training will be required to address listed deficits to achieve increased bilateral LE MMT, decreased pain, and increased static/dynamic balance to achieve safe community and household ambulation.    Personal Factors and Comorbidities Age;Past/Current Experience;Time since onset of injury/illness/exacerbation    Examination-Activity Limitations  Bathing;Continence;Stairs;Bed Mobility;Dressing;Stand;Bend;Hygiene/Grooming;Sit;Toileting;Lift;Transfers;Carry;Locomotion Level    Examination-Participation Restrictions Church;Driving;Volunteer;Cleaning;Interpersonal Relationship;Meal Prep;Yard Work;Community Activity;Laundry;Shop    Stability/Clinical Decision Making Evolving/Moderate complexity    Clinical Decision Making Moderate    Rehab Potential  Good    PT Frequency 2x / week    PT Duration 8 weeks    PT Treatment/Interventions ADLs/Self Care Home Management;Biofeedback;Cryotherapy;Electrical Stimulation;Moist Heat;Iontophoresis 4mg /ml Dexamethasone;Traction;Ultrasound;Gait training;Stair training;Functional mobility training;Therapeutic activities;Therapeutic exercise;Balance training;Neuromuscular re-education;Patient/family education;Orthotic Fit/Training;Wheelchair mobility training;Manual techniques;Passive range of motion;Dry needling;Energy conservation;Splinting;Taping;Joint Manipulations;Spinal Manipulations    PT Next Visit Plan 5x STS, 6 min walk test, assess stairs    PT Home Exercise Plan sciatic nerve glide, DKTC, Prone Prop    Consulted and Agree with Plan of Care Patient;Family member/caregiver    Family Member Consulted granddaughter             Patient will benefit from skilled therapeutic intervention in order to improve the following deficits and impairments:  Abnormal gait, Decreased activity tolerance, Decreased endurance, Decreased knowledge of use of DME, Decreased strength, Improper body mechanics, Pain, Decreased balance, Decreased coordination, Decreased mobility, Difficulty walking, Postural dysfunction  Visit Diagnosis: Radiculopathy, lumbar region  Abnormality of gait and mobility  Muscle weakness (generalized)  Unsteadiness on feet     Problem List Patient Active Problem List   Diagnosis Date Noted   Leg pain, bilateral 07/26/2021   Leukocytosis 07/26/2021   Radiculopathy 07/26/2021   Elevated blood pressure reading 07/26/2021   Bilateral sciatica 07/18/2021   Lower back pain 08/23/2019   Stye 09/11/2018   Health care maintenance 08/09/2017   Cold sore 07/15/2015   Advance care planning 07/12/2014   Migraine aura without headache 03/28/2014   Medicare annual wellness visit, subsequent 07/03/2012   Osteopenia 06/26/2011   HLD (hyperlipidemia) 04/27/2007   ALLERGY, ENVIRONMENTAL  04/27/2007   Claiborne Billings O'Daniel, SPT  Patrina Levering PT, DPT   [facility] [address] [place], [address] Phone: [phone]   Fax:  [phone]  Name: ROZENA FIERRO MRN: [id number] Date of Birth: 1945-12-25

## 2021-08-14 ENCOUNTER — Ambulatory Visit: Payer: Medicare HMO | Admitting: Physical Therapy

## 2021-08-15 ENCOUNTER — Telehealth: Payer: Self-pay | Admitting: Family Medicine

## 2021-08-15 ENCOUNTER — Other Ambulatory Visit: Payer: Self-pay

## 2021-08-15 ENCOUNTER — Ambulatory Visit: Payer: Medicare HMO | Admitting: Physical Therapy

## 2021-08-15 ENCOUNTER — Encounter: Payer: Self-pay | Admitting: Physical Therapy

## 2021-08-15 DIAGNOSIS — M6281 Muscle weakness (generalized): Secondary | ICD-10-CM | POA: Diagnosis not present

## 2021-08-15 DIAGNOSIS — R2681 Unsteadiness on feet: Secondary | ICD-10-CM | POA: Diagnosis not present

## 2021-08-15 DIAGNOSIS — R269 Unspecified abnormalities of gait and mobility: Secondary | ICD-10-CM

## 2021-08-15 DIAGNOSIS — M5416 Radiculopathy, lumbar region: Secondary | ICD-10-CM | POA: Diagnosis not present

## 2021-08-15 DIAGNOSIS — M79604 Pain in right leg: Secondary | ICD-10-CM | POA: Diagnosis not present

## 2021-08-15 DIAGNOSIS — M79605 Pain in left leg: Secondary | ICD-10-CM | POA: Diagnosis not present

## 2021-08-15 MED ORDER — HYDROCODONE-ACETAMINOPHEN 5-325 MG PO TABS: 1.0000 | ORAL_TABLET | Freq: Four times a day (QID) | ORAL | 0 refills | Status: DC | PRN

## 2021-08-15 NOTE — Addendum Note (Signed)
Addended by: Tonia Ghent on: 08/15/2021 02:09 PM   Modules accepted: Orders

## 2021-08-15 NOTE — Telephone Encounter (Signed)
Sent. Thanks.   

## 2021-08-15 NOTE — Telephone Encounter (Signed)
°  Encourage patient to contact the pharmacy for refills or they can request refills through [place]:  Please schedule appointment if longer than 1 year  NEXT APPOINTMENT DATE: 08/25/21  MEDICATION: HYDROcodone-acetaminophen (NORCO) 5-325 MG tablet  Is the patient out of medication? Just took the last one this morning   PHARMACY: [facility]- [address]  Let patient know to contact pharmacy at the end of the day to make sure medication is ready.  Please notify patient to allow 48-72 hours to process  CLINICAL FILLS OUT ALL BELOW:   LAST REFILL:  QTY:  REFILL DATE:    OTHER COMMENTS:    Okay for refill?  Please advise

## 2021-08-15 NOTE — Therapy (Signed)
[facility] [address], [place], [address] Phone: [phone]   Fax:  [phone]  Physical Therapy Treatment  Patient Details  Name: Desiree Ortega MRN: [id number] Date of Birth: August 04, 1945 No data recorded  Encounter Date: 08/15/2021   PT End of Session - 08/15/21 1025     Visit Number 2    Number of Visits 17    Authorization - Visit Number --    Authorization - Number of Visits --    Progress Note Due on Visit 10    PT Start Time 0930    PT Stop Time 1017    PT Time Calculation (min) 47 min    Activity Tolerance Patient tolerated treatment well    Behavior During Therapy [facility] for tasks assessed/performed             Past Medical History:  Diagnosis Date   Allergy    Cancer ([place]) on back,leg,tailbone   prev squam and basal cell skin CA removed- Dr. Evorn Gong   Diverticulosis    Moderate   Female bladder prolapse    H/O cold sores    History of colon polyps    History of kidney stones    Hyperlipidemia    Migraine with aura    Osteopenia    prev on fosamax for a few years then started evista at age ~3, T score improved  from -2.29/-1.96 to -1.8/-1.7 as of 06/2011, repeat DXA done 2015    Past Surgical History:  Procedure Laterality Date   BREAST BIOPSY Left    benign   CATARACT EXTRACTION W/ INTRAOCULAR LENS IMPLANT Left 11/2016   CATARACT EXTRACTION W/ INTRAOCULAR LENS IMPLANT Right 12/22/2016   COLONOSCOPY  08/15/2013   cyst on back     2 times   cyst on eyelid     right eye   CYSTOCELE REPAIR N/A 02/01/2018   Procedure: ANTERIOR REPAIR (CYSTOCELE);  Surgeon: Bjorn Loser, MD;  Location: [facility];  Service: Urology;  Laterality: N/A;   CYSTOSCOPY N/A 02/01/2018   Procedure: cystoscopy;  Surgeon: Bjorn Loser, MD;  Location: [facility];  Service: Urology;  Laterality: N/A;   LAPAROSCOPIC VAGINAL HYSTERECTOMY WITH SALPINGO OOPHORECTOMY Bilateral 02/01/2018   Procedure:  LAPAROSCOPIC ASSISTED VAGINAL HYSTERECTOMY WITH BILATERAL SALPINGO OOPHORECTOMY;  Surgeon: Servando Salina, MD;  Location: [facility];  Service: Gynecology;  Laterality: Bilateral;   SQUAMOUS CELL CARCINOMA EXCISION     on back/removed 2 times/and thigh   SQUAMOUS CELL CARCINOMA EXCISION Left 07/15/2016   left lower calf   SSC removed  1988   Birthmark removal partial 1/3   TONSILLECTOMY  1950   TUBAL LIGATION  1980's    There were no vitals filed for this visit.   Subjective Assessment - 08/15/21 0932     Subjective Pnt reports she was unable to get out of bed this morning due to right leg pain "burning" down to her toes. Currently the pain is 7/10 on NPS consentrated at her glutes, and she is seeing her neurologist next Wednesday. She reports HEP is very helpful and giving her relief.    Pertinent History 76 y.o. female pnt presents with bilateral hip pain with radiating symptoms down the back of bilateral legs to her knees that began Jan. 23rd with insidious onset. Pnt descibes pain as "burning" in her hips with best a 0/10 (on pain medication), worst 10/10, and currently 5/10 on NPS. Pnt is limited in her usual recreational activities (yoga,  walking, etc) and was previously very active. Her home setup includes 14 stairs with bilateral railing and a shower with a railing for balance that she installed for her husband following his stroke. She currently lives with her husband. Aggrevating factors include sitting for more than 1 minute, walking and/or standing for more than 30 minutes, bed mobility, and getting into and out of a car. Easing factors include laying flat on her back or in hooklying, reclining in a chair, and taking medication. Pnt is scheduled for a consultation with a neurologist on March 1st and currently has a catheter (due to urinary retention) which she reports is helping her pain. Pnt has had pelvic floor PT in the past and would be interested in that option if it is more  appropriate. Pnt denys saddle anaesthesia, n/v, fever, or night pain. Pnt has noticed she has lost about 8 pounds since last January. Her goal for physical therapy is to decrease her pain.    Limitations Sitting;Walking;Lifting;House hold activities;Standing    How long can you sit comfortably? 1 minute    How long can you stand comfortably? 30 min    How long can you walk comfortably? 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 Yes    Pain Score 7     Pain Location Buttocks    Pain Orientation Right;Left;Posterior    Pain Descriptors / Indicators Burning    Pain Type Chronic pain    Pain Onset More than a month ago    Pain Frequency Intermittent              Therex Nu Step with reclined back - 5 min for gentle lumbosacral mobility Prone Press Up 12x - centralization achieved within session Stretch DKTC 3 x 30sec  Piriformis Stretch 3 x 30 sec- min verbal cueing for form SLS on foam alternating legs 3 x 30 sec- min CGA Standing Hip Extension on MATRIX 25# 3 x 8- min verbal cueing to not arch lumbar spine and work within a smaller range Standing Hip Flexion on MATRIX 10# 3 x 6  Standing Hip Flexor stretch - 3 x 30 sec  Hip MMT Hip Extension: bilateral 3-/5 Hip IR: bilateral 4+/5 Hip ER: bilateral 4+/5   Manual Therapy  STM to bilateral glute med/min  Trigger Point Release to bilateral glute med/min  10 min total   HEP updated to include supine figure 4 stretch    Next Session  5x STS  Stair Assessment / Training                         PT Education - 08/15/21 1025     Education Details therex form and nerve glide sequencing    Person(s) Educated Patient    Methods Explanation;Demonstration;Tactile cues;Verbal cues    Comprehension Verbalized understanding;Returned demonstration              PT Short Term Goals - 08/12/21 1557        PT SHORT TERM GOAL #1   Title Pnt will be fully independent with HEP in order to progress at home to comeplete ADLs.    Baseline 08/12/21 HEP given    Time 4    Period Weeks    Status New    Target Date 09/09/21  PT Long Term Goals - 08/12/21 1558       PT LONG TERM GOAL #1   Title Pnt will decrease max NPS value from 10/10 to 8/10 to show clinically significant reduction of pain.    Baseline 08/12/21 10/10NPS    Time 8    Period Weeks    Status New    Target Date 10/07/21      PT LONG TERM GOAL #2   Title Pnt will increase 10 meter walk test with RW to 1.26-1.13 m/s in order to meet age norm value to show safe community ambulation.    Baseline 08/12/21 .51m/s    Time 8    Period Weeks    Status New    Target Date 10/07/21      PT LONG TERM GOAL #3   Title Pnt will increase SLS time on left leg to >10sec in order to show clinically significant decrease in community fall risk.    Baseline 08/12/21 L: 9.16 seconds    Time 8    Period Weeks    Status New    Target Date 10/07/21      PT LONG TERM GOAL #4   Title Pnt will increase bilateral hip flexion MMT by one muscle grade in order to show significant LE strength gain to complete functional and community ADLs.    Baseline 08/12/21 bilateral 3/5    Time 8    Period Weeks    Status New    Target Date 10/07/21                   Plan - 08/15/21 1032     Clinical Impression Statement Pnt presented with 7/10 "burning" pain on NPS in her glutes. SPT performed STM and Trigger Point Release to bilateral glute med and min with pnt reported decrease in symptoms. Therex was progressed to include piriformis stretch, standing multidirectional hip strengthening, and static balance. Pnt was able to comply with min CGA and cueing for form. She maintained excellent motivation throughout session and has significant sx relief with prone positions to centralize her radiating pain. HEP updated to include  piriformis stretch. Following therex and manual therapy, pnt reported significant decrease in sx. PT will continue as able.    Personal Factors and Comorbidities Age;Past/Current Experience;Time since onset of injury/illness/exacerbation    Examination-Activity Limitations Bathing;Continence;Stairs;Bed Mobility;Dressing;Stand;Bend;Hygiene/Grooming;Sit;Toileting;Lift;Transfers;Carry;Locomotion Level    Examination-Participation Restrictions Church;Driving;Volunteer;Cleaning;Interpersonal Relationship;Meal Prep;Yard Work;Community Activity;Laundry;Shop    Stability/Clinical Decision Making Evolving/Moderate complexity    Clinical Decision Making Moderate    Rehab Potential Good    PT Frequency 2x / week    PT Duration 8 weeks    PT Treatment/Interventions ADLs/Self Care Home Management;Biofeedback;Cryotherapy;Electrical Stimulation;Moist Heat;Iontophoresis 4mg /ml Dexamethasone;Traction;Ultrasound;Gait training;Stair training;Functional mobility training;Therapeutic activities;Therapeutic exercise;Balance training;Neuromuscular re-education;Patient/family education;Orthotic Fit/Training;Wheelchair mobility training;Manual techniques;Passive range of motion;Dry needling;Energy conservation;Splinting;Taping;Joint Manipulations;Spinal Manipulations    PT Next Visit Plan 5x STS, 6 min walk test, assess stairs    PT Home Exercise Plan sciatic nerve glide, DKTC, Prone Prop, piriformis stretch    Consulted and Agree with Plan of Care Patient             Patient will benefit from skilled therapeutic intervention in order to improve the following deficits and impairments:  Abnormal gait, Decreased activity tolerance, Decreased endurance, Decreased knowledge of use of DME, Decreased strength, Improper body mechanics, Pain, Decreased balance, Decreased coordination, Decreased mobility, Difficulty walking, Postural dysfunction  Visit Diagnosis: Radiculopathy, lumbar region  Muscle weakness  (generalized)  Unsteadiness on feet  Abnormality of gait and mobility     Problem List Patient Active Problem List   Diagnosis Date Noted   Leg pain, bilateral 07/26/2021   Leukocytosis 07/26/2021   Radiculopathy 07/26/2021   Elevated blood pressure reading 07/26/2021   Bilateral sciatica 07/18/2021   Lower back pain 08/23/2019   Stye 09/11/2018   Health care maintenance 08/09/2017   Cold sore 07/15/2015   Advance care planning 07/12/2014   Migraine aura without headache 03/28/2014   Medicare annual wellness visit, subsequent 07/03/2012   Osteopenia 06/26/2011   HLD (hyperlipidemia) 04/27/2007   ALLERGY, ENVIRONMENTAL 04/27/2007   Claiborne Billings O'Daniel, SPT  Patrina Levering PT, DPT   [facility] [address] [place], [address] Phone: [phone]   Fax:  [phone]  Name: Desiree Ortega MRN: [id number] Date of Birth: 08/02/1945

## 2021-08-18 ENCOUNTER — Other Ambulatory Visit (INDEPENDENT_AMBULATORY_CARE_PROVIDER_SITE_OTHER): Payer: Medicare HMO

## 2021-08-18 ENCOUNTER — Other Ambulatory Visit: Payer: Self-pay

## 2021-08-18 DIAGNOSIS — E785 Hyperlipidemia, unspecified: Secondary | ICD-10-CM | POA: Diagnosis not present

## 2021-08-18 LAB — LIPID PANEL
Cholesterol: 250 mg/dL — ABNORMAL HIGH (ref 0–200)
HDL: 54.6 mg/dL (ref >39.00)
LDL Cholesterol: 159 mg/dL — ABNORMAL HIGH (ref 0–99)
NonHDL: 195.34
Total CHOL/HDL Ratio: 5
Triglycerides: 183 mg/dL — ABNORMAL HIGH (ref 0.0–149.0)
VLDL: 36.6 mg/dL (ref 0.0–40.0)

## 2021-08-19 ENCOUNTER — Ambulatory Visit: Payer: Medicare HMO | Admitting: Physical Therapy

## 2021-08-19 ENCOUNTER — Encounter: Payer: Self-pay | Admitting: Physical Therapy

## 2021-08-19 DIAGNOSIS — M6281 Muscle weakness (generalized): Secondary | ICD-10-CM

## 2021-08-19 DIAGNOSIS — R269 Unspecified abnormalities of gait and mobility: Secondary | ICD-10-CM

## 2021-08-19 DIAGNOSIS — R2681 Unsteadiness on feet: Secondary | ICD-10-CM | POA: Diagnosis not present

## 2021-08-19 DIAGNOSIS — M5416 Radiculopathy, lumbar region: Secondary | ICD-10-CM | POA: Diagnosis not present

## 2021-08-19 DIAGNOSIS — M79604 Pain in right leg: Secondary | ICD-10-CM | POA: Diagnosis not present

## 2021-08-19 DIAGNOSIS — M79605 Pain in left leg: Secondary | ICD-10-CM | POA: Diagnosis not present

## 2021-08-19 NOTE — Therapy (Addendum)
[facility] [address], [place], [address] Phone: [phone]   Fax:  [phone]  Physical Therapy Treatment  Patient Details  Name: Desiree Ortega MRN: [id number] Date of Birth: 03-28-1946 No data recorded  Encounter Date: 08/19/2021   PT End of Session - 08/19/21 1733     Visit Number 3    Number of Visits 17    Authorization - Visit Number 3    Authorization - Number of Visits 17    Progress Note Due on Visit 10    PT Start Time 4431    PT Stop Time 1730    PT Time Calculation (min) 45 min    Equipment Utilized During Treatment Gait belt    Activity Tolerance Patient tolerated treatment well    Behavior During Therapy WFL for tasks assessed/performed             Past Medical History:  Diagnosis Date   Allergy    Cancer ([place]) on back,leg,tailbone   prev squam and basal cell skin CA removed- Dr. Evorn Gong   Diverticulosis    Moderate   Female bladder prolapse    H/O cold sores    History of colon polyps    History of kidney stones    Hyperlipidemia    Migraine with aura    Osteopenia    prev on fosamax for a few years then started evista at age ~52, T score improved  from -2.29/-1.96 to -1.8/-1.7 as of 06/2011, repeat DXA done 2015    Past Surgical History:  Procedure Laterality Date   BREAST BIOPSY Left    benign   CATARACT EXTRACTION W/ INTRAOCULAR LENS IMPLANT Left 11/2016   CATARACT EXTRACTION W/ INTRAOCULAR LENS IMPLANT Right 12/22/2016   COLONOSCOPY  08/15/2013   cyst on back     2 times   cyst on eyelid     right eye   CYSTOCELE REPAIR N/A 02/01/2018   Procedure: ANTERIOR REPAIR (CYSTOCELE);  Surgeon: Bjorn Loser, MD;  Location: [facility];  Service: Urology;  Laterality: N/A;   CYSTOSCOPY N/A 02/01/2018   Procedure: cystoscopy;  Surgeon: Bjorn Loser, MD;  Location: [facility];  Service: Urology;  Laterality: N/A;   LAPAROSCOPIC VAGINAL HYSTERECTOMY WITH SALPINGO  OOPHORECTOMY Bilateral 02/01/2018   Procedure: LAPAROSCOPIC ASSISTED VAGINAL HYSTERECTOMY WITH BILATERAL SALPINGO OOPHORECTOMY;  Surgeon: Servando Salina, MD;  Location: [facility];  Service: Gynecology;  Laterality: Bilateral;   SQUAMOUS CELL CARCINOMA EXCISION     on back/removed 2 times/and thigh   SQUAMOUS CELL CARCINOMA EXCISION Left 07/15/2016   left lower calf   SSC removed  1988   Birthmark removal partial 1/3   TONSILLECTOMY  1950   TUBAL LIGATION  1980's    There were no vitals filed for this visit.   Subjective Assessment - 08/19/21 1649     Subjective Pnt reports feeling good with no pain and would like to wait 2 weeks until her next visit because she "has a lot going on and is feeling better."    Pertinent History 76 y.o. female pnt presents with bilateral hip pain with radiating symptoms down the back of bilateral legs to her knees that began Jan. 23rd with insidious onset. Pnt descibes pain as "burning" in her hips with best a 0/10 (on pain medication), worst 10/10, and currently 5/10 on NPS. Pnt is limited in her usual recreational activities (yoga, walking, etc) and was previously very active. Her home setup includes 14 stairs  with bilateral railing and a shower with a railing for balance that she installed for her husband following his stroke. She currently lives with her husband. Aggrevating factors include sitting for more than 1 minute, walking and/or standing for more than 30 minutes, bed mobility, and getting into and out of a car. Easing factors include laying flat on her back or in hooklying, reclining in a chair, and taking medication. Pnt is scheduled for a consultation with a neurologist on March 1st and currently has a catheter (due to urinary retention) which she reports is helping her pain. Pnt has had pelvic floor PT in the past and would be interested in that option if it is more appropriate. Pnt denys saddle anaesthesia, n/v, fever, or night pain. Pnt has noticed she  has lost about 8 pounds since last January. Her goal for physical therapy is to decrease her pain.    Limitations Sitting;Walking;Lifting;House hold activities;Standing    How long can you sit comfortably? 1 minute    How long can you stand comfortably? 30 min    How long can you walk comfortably? 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 No/denies               Manual Therapy  STM to bilateral glute med/min  Trigger Point Release to bilateral glute med/min  10 min total   Therex Nu Step with reclined back - 5 min for gentle lumbosacral mobility Stretch DKTC 3 x 30sec - min verbal cueing for form and pairing breath with the stretch Piriformis Stretch 3 x 30 sec- min verbal cueing for form  Standing Hip Extension on MATRIX 25# 3 x 6- min verbal cueing to not arch lumbar spine and eccentric control Standing bilateral Hip Flexion on MATRIX 25# 3 x 6 - min verbal cueing to stay within a pain free range - pnt reported harder on the right side Bilateral Standing Hip Flexor stretch - 3 x 30 sec SLS on foam alternating legs 3 x 30 sec- min CGA Sciatic Nerve flossing x12 - pnt reported decrease in sx  SLS on Left leg: 33 sec                       PT Education - 08/19/21 1733     Education Details therex form and HEP    Person(s) Educated Patient    Methods Explanation;Demonstration;Verbal cues    Comprehension Verbalized understanding;Returned demonstration              PT Short Term Goals - 08/12/21 1557       PT SHORT TERM GOAL #1   Title Pnt will be fully independent with HEP in order to progress at home to comeplete ADLs.    Baseline 08/12/21 HEP given    Time 4    Period Weeks    Status New    Target Date 09/09/21               PT Long Term Goals - 08/12/21 1558       PT LONG TERM GOAL #1   Title Pnt will decrease max NPS  value from 10/10 to 8/10 to show clinically significant reduction of pain.    Baseline 08/12/21 10/10NPS    Time 8    Period Weeks    Status New    Target Date 10/07/21  PT LONG TERM GOAL #2   Title Pnt will increase 10 meter walk test with RW to 1.26-1.13 m/s in order to meet age norm value to show safe community ambulation.    Baseline 08/12/21 .70m/s    Time 8    Period Weeks    Status New    Target Date 10/07/21      PT LONG TERM GOAL #3   Title Pnt will increase SLS time on left leg to >10sec in order to show clinically significant decrease in community fall risk.    Baseline 08/12/21 L: 9.16 seconds    Time 8    Period Weeks    Status New    Target Date 10/07/21      PT LONG TERM GOAL #4   Title Pnt will increase bilateral hip flexion MMT by one muscle grade in order to show significant LE strength gain to complete functional and community ADLs.    Baseline 08/12/21 bilateral 3/5    Time 8    Period Weeks    Status New    Target Date 10/07/21                   Plan - 08/19/21 1743     Clinical Impression Statement Pnt presented with no pain today and wanting to pause physical therapy for 2 weeks due to her reported sx improvements and busy schedule. SPT performed manual therapy to right glute med/min to improve tone and increase functional ROM. Therex was progressed to include standing hip flexion strengthening and static balance on an uneven surface to help pnt improve SLS in gait cycle on uneven terrain to decrease community fall risk. Pnt able to comply with min verbal cueing and right sciatic nerve flossing to decrease radiating sx following therex. Pnt maintained excellent motivation throughout session and increased SLS time on left leg from 9.16 sec to 55 sec showing significant increase in static balance. PT will continue as able    Personal Factors and Comorbidities Age;Past/Current Experience;Time since onset of injury/illness/exacerbation     Examination-Activity Limitations Bathing;Continence;Stairs;Bed Mobility;Dressing;Stand;Bend;Hygiene/Grooming;Sit;Toileting;Lift;Transfers;Carry;Locomotion Level    Examination-Participation Restrictions Church;Driving;Volunteer;Cleaning;Interpersonal Relationship;Meal Prep;Yard Work;Community Activity;Laundry;Shop    Stability/Clinical Decision Making Evolving/Moderate complexity    Clinical Decision Making Moderate    Rehab Potential Good    PT Frequency 2x / week    PT Duration 8 weeks    PT Treatment/Interventions ADLs/Self Care Home Management;Biofeedback;Cryotherapy;Electrical Stimulation;Moist Heat;Iontophoresis 4mg /ml Dexamethasone;Traction;Ultrasound;Gait training;Stair training;Functional mobility training;Therapeutic activities;Therapeutic exercise;Balance training;Neuromuscular re-education;Patient/family education;Orthotic Fit/Training;Wheelchair mobility training;Manual techniques;Passive range of motion;Dry needling;Energy conservation;Splinting;Taping;Joint Manipulations;Spinal Manipulations    PT Next Visit Plan 5x STS, 6 min walk test, assess stairs    PT Home Exercise Plan sciatic nerve glide, DKTC, Prone Prop, piriformis stretch, SLS    Consulted and Agree with Plan of Care Patient             Patient will benefit from skilled therapeutic intervention in order to improve the following deficits and impairments:  Abnormal gait, Decreased activity tolerance, Decreased endurance, Decreased knowledge of use of DME, Decreased strength, Improper body mechanics, Pain, Decreased balance, Decreased coordination, Decreased mobility, Difficulty walking, Postural dysfunction  Visit Diagnosis: Radiculopathy, lumbar region  Muscle weakness (generalized)  Unsteadiness on feet  Abnormality of gait and mobility     Problem List Patient Active Problem List   Diagnosis Date Noted   Leg pain, bilateral 07/26/2021   Leukocytosis 07/26/2021   Radiculopathy 07/26/2021   Elevated  blood pressure reading 07/26/2021   Bilateral sciatica 07/18/2021  Lower back pain 08/23/2019   Stye 09/11/2018   Health care maintenance 08/09/2017   Cold sore 07/15/2015   Advance care planning 07/12/2014   Migraine aura without headache 03/28/2014   Medicare annual wellness visit, subsequent 07/03/2012   Osteopenia 06/26/2011   HLD (hyperlipidemia) 04/27/2007   ALLERGY, ENVIRONMENTAL 04/27/2007   Desiree Ortega, SPT  Patrina Levering PT, DPT   [facility] [address], [place], [address] Phone: [phone]   Fax:  [phone]  Name: ADONICA FUKUSHIMA MRN: [id number] Date of Birth: 1946/05/24

## 2021-08-20 ENCOUNTER — Encounter: Payer: Medicare HMO | Admitting: Physical Therapy

## 2021-08-20 DIAGNOSIS — M4714 Other spondylosis with myelopathy, thoracic region: Secondary | ICD-10-CM | POA: Diagnosis not present

## 2021-08-20 DIAGNOSIS — M5416 Radiculopathy, lumbar region: Secondary | ICD-10-CM | POA: Diagnosis not present

## 2021-08-20 DIAGNOSIS — R030 Elevated blood-pressure reading, without diagnosis of hypertension: Secondary | ICD-10-CM | POA: Diagnosis not present

## 2021-08-20 NOTE — Progress Notes (Signed)
Subjective:   Desiree Ortega is a 76 y.o. female who presents for Medicare Annual (Subsequent) preventive examination.  I connected with [name] today by telephone and verified that I am speaking with the correct person using two identifiers. Location patient: home Location provider: work Persons participating in the virtual visit: patient, Marine scientist.    I discussed the limitations, risks, security and privacy concerns of performing an evaluation and management service by telephone and the availability of in person appointments. I also discussed with the patient that there may be a patient responsible charge related to this service. The patient expressed understanding and verbally consented to this telephonic visit.    Interactive audio and video telecommunications were attempted between this provider and patient, however failed, due to patient having technical difficulties OR patient did not have access to video capability.  We continued and completed visit with audio only.  Some vital signs may be absent or patient reported.   Time Spent with patient on telephone encounter: 20 minutes  Review of Systems     Cardiac Risk Factors include: advanced age (>7men, >4 women);dyslipidemia     Objective:    Today's Vitals   08/21/21 1029 08/21/21 1030  Weight: 141 lb (64 kg)   Height: 5\' 6"  (1.676 m)   PainSc:  5    Body mass index is 22.76 kg/m.  Advanced Directives 08/21/2021 08/12/2021 07/27/2021 07/26/2021 07/25/2021 07/15/2021 07/14/2021  Does Patient Have a Medical Advance Directive? Yes Yes Yes No No No Yes  Type of Paramedic of [place];Living will [place];Living will [place];Living will - - - -  Does patient want to make changes to medical advance directive? Yes (MAU/Ambulatory/Procedural Areas - Information given) No - Patient declined No - Patient declined - - - -  Copy of [place] in Chart?  Yes - validated most recent copy scanned in chart (See row information) - - - - - -  Would patient like information on creating a medical advance directive? - No - Patient declined No - Patient declined - No - Guardian declined - -    Current Medications (verified) Outpatient Encounter Medications as of 08/21/2021  Medication Sig   Ascorbic Acid (VITAMIN C) 1000 MG tablet Take 1 tablet (1,000 mg total) by mouth daily.   b complex vitamins tablet Take 1 tablet by mouth daily.   Cholecalciferol (VITAMIN D PO) Take 1,000 Units by mouth daily.   CINNAMON PO Take 2,000 mg by mouth daily.   Cyanocobalamin (B-12 PO) Take 1,000 mcg by mouth daily.   fexofenadine (ALLEGRA) 180 MG tablet Take 1 tablet (180 mg total) by mouth as needed. Seasonal allergies from Spring to Fall   Flaxseed, Linseed, (FLAX SEEDS PO) Take 1 capsule by mouth daily.   HYDROcodone-acetaminophen (NORCO) 5-325 MG tablet Take 1 tablet by mouth every 6 (six) hours as needed for severe pain.   Magnesium 250 MG TABS Take 250 mg by mouth daily.   Multiple Vitamin (MULTIVITAMIN) capsule Take 1 capsule by mouth daily.   Omega-3 Fatty Acids (FISH OIL PO) Take 1,000 mg by mouth in the morning and at bedtime.   Red Yeast Rice Extract 600 MG CAPS Take 1 capsule (600 mg total) by mouth daily.   Turmeric 500 MG CAPS Take 500 mg by mouth daily.   Ubiquinol 100 MG CAPS Take 100 mg by mouth daily.   Zinc 50 MG CAPS Take 50 mg by mouth daily.  acetaminophen (TYLENOL) 500 MG tablet Take 1,000 mg by mouth 2 (two) times daily as needed for mild pain or headache. (Patient not taking: Reported on 08/21/2021)   Melatonin 3 MG TABS Take 1 tablet (3 mg total) by mouth at bedtime as needed. (Patient not taking: Reported on 08/21/2021)   methylPREDNISolone (MEDROL DOSEPAK) 4 MG TBPK tablet Take as directed on pack with food starting from day 1 (Patient not taking: Reported on 08/21/2021)   No facility-administered encounter medications on file as of 08/21/2021.     Allergies (verified) Alendronate sodium, Codeine, Influenza vaccines, and Keflex [cephalexin]   History: Past Medical History:  Diagnosis Date   Allergy    Cancer ([place]) on back,leg,tailbone   prev squam and basal cell skin CA removed- Dr. Evorn Gong   Diverticulosis    Moderate   Female bladder prolapse    H/O cold sores    History of colon polyps    History of kidney stones    Hyperlipidemia    Migraine with aura    Osteopenia    prev on fosamax for a few years then started evista at age ~30, T score improved  from -2.29/-1.96 to -1.8/-1.7 as of 06/2011, repeat DXA done 2015   Past Surgical History:  Procedure Laterality Date   BREAST BIOPSY Left    benign   CATARACT EXTRACTION W/ INTRAOCULAR LENS IMPLANT Left 11/2016   CATARACT EXTRACTION W/ INTRAOCULAR LENS IMPLANT Right 12/22/2016   COLONOSCOPY  08/15/2013   cyst on back     2 times   cyst on eyelid     right eye   CYSTOCELE REPAIR N/A 02/01/2018   Procedure: ANTERIOR REPAIR (CYSTOCELE);  Surgeon: Bjorn Loser, MD;  Location: [facility];  Service: Urology;  Laterality: N/A;   CYSTOSCOPY N/A 02/01/2018   Procedure: cystoscopy;  Surgeon: Bjorn Loser, MD;  Location: [facility];  Service: Urology;  Laterality: N/A;   LAPAROSCOPIC VAGINAL HYSTERECTOMY WITH SALPINGO OOPHORECTOMY Bilateral 02/01/2018   Procedure: LAPAROSCOPIC ASSISTED VAGINAL HYSTERECTOMY WITH BILATERAL SALPINGO OOPHORECTOMY;  Surgeon: Servando Salina, MD;  Location: [facility];  Service: Gynecology;  Laterality: Bilateral;   SQUAMOUS CELL CARCINOMA EXCISION     on back/removed 2 times/and thigh   SQUAMOUS CELL CARCINOMA EXCISION Left 07/15/2016   left lower calf   SSC removed  1988   Birthmark removal partial 1/3   TONSILLECTOMY  1950   TUBAL LIGATION  78's   Family History  Problem Relation Age of Onset   Osteopenia Mother    Hypertension Father    Heart disease Father        AFIB, PVD stents LE's, pacer   Stroke Father    Cancer Brother         prostate, prostatectomy, radiation 5 years later, Hormone Tx   Prostate cancer Brother    Heart disease Brother    Cancer Brother        prostate, prostatectomy   Prostate cancer Brother    Diabetes Other    Cancer Other        Non-Hodgkin's Lymphoma   Depression Daughter    Alcohol abuse Neg Hx    Drug abuse Neg Hx    Colon cancer Neg Hx    Breast cancer Neg Hx    Bladder Cancer Neg Hx    Kidney cancer Neg Hx    Social History   Socioeconomic History   Marital status: Married    Spouse name: Not on file   Number of children: 4  Years of education: Not on file   Highest education level: Not on file  Occupational History   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  Tobacco Use   Smoking status: Former    Packs/day: 0.50    Years: 36.00    Pack years: 18.00    Types: Cigarettes    Quit date: 06/22/2001    Years since quitting: 20.1   Smokeless tobacco: Never  Vaping Use   Vaping Use: Never used  Substance and Sexual Activity   Alcohol use: Not Currently    Alcohol/week: 2.0 standard drinks    Types: 2 Glasses of wine per week    Comment: occasionally   Drug use: No   Sexual activity: Yes    Birth control/protection: Post-menopausal  Other Topics Concern   Not on file  Social History Narrative   Married 1963, lives with husband   Cubs and [employer] fan   Mother in ALF as of 2022 (pt's mother was born in [place])   27 daughters   Taught swimming prev   Social Determinants of Health   Financial Resource Strain: Low Risk    Difficulty of Paying Living Expenses: Not hard at all  Food Insecurity: No Food Insecurity   Worried About Charity fundraiser in the Last Year: Never true   Arboriculturist in the Last Year: Never true  Transportation Needs: No Transportation Needs   Lack of Transportation (Medical): No   Lack of Transportation (Non-Medical): No  Physical Activity: Inactive   Days of Exercise per Week: 0 days   Minutes of  Exercise per Session: 0 min  Stress: No Stress Concern Present   Feeling of Stress : Not at all  Social Connections: Moderately Integrated   Frequency of Communication with Friends and Family: More than three times a week   Frequency of Social Gatherings with Friends and Family: More than three times a week   Attends Religious Services: More than 4 times per year   Active Member of [employer] or Organizations: No   Attends Music therapist: Never   Marital Status: Married    Tobacco Counseling Counseling given: Not Answered   Clinical Intake:  Pre-visit preparation completed: Yes  Pain : 0-10 Pain Score: 5  Pain Location: Genitalia     BMI - recorded: 22.76 Nutritional Status: BMI of 19-24  Normal Nutritional Risks: Unintentional weight loss  How often do you need to have someone help you when you read instructions, pamphlets, or other written materials from your doctor or pharmacy?: 1 - Never  Diabetic? No  Interpreter Needed?: No  Information entered by :: Orrin Brigham LPN   Activities of Daily Living In your present state of health, do you have any difficulty performing the following activities: 08/21/2021 07/27/2021  Hearing? N N  Vision? N N  Difficulty concentrating or making decisions? N N  Walking or climbing stairs? N N  Dressing or bathing? N N  Doing errands, shopping? Y N  Comment at the current moment due to illness -  Conservation officer, nature and eating ? N -  Using the Toilet? N -  In the past six months, have you accidently leaked urine? N -  Do you have problems with loss of bowel control? N -  Managing your Medications? N -  Managing your Finances? N -  Housekeeping or managing your Housekeeping? N -  Some recent data might be hidden    Patient Care Team:  Tonia Ghent, MD as PCP - General (Family Medicine) Bryson Ha, OD as Consulting Physician (Optometry) Dasher, Rayvon Char, MD as Consulting Physician (Dermatology) Altha Harm, DDS as  Consulting Physician (Dentistry)  Indicate any recent Medical Services you may have received from other than [facility] providers in the past year (date may be approximate).     Assessment:   This is a routine wellness examination for Samariyah.  Hearing/Vision screen Hearing Screening - Comments:: No issues Vision Screening - Comments:: Last exam 06/2021, Dr. Kerin Ransom  Dietary issues and exercise activities discussed: Current Exercise Habits: The patient does not participate in regular exercise at present;Home exercise routine, Type of exercise: walking   Goals Addressed             This Visit's Progress    Patient Stated       Would like to get back to eating healthier and exercising        Depression Screen PHQ 2/9 Scores 08/21/2021 08/20/2020 08/18/2019 08/12/2018 08/04/2017 07/16/2016 07/15/2015  PHQ - 2 Score 0 0 0 0 0 0 0  PHQ- 9 Score - 0 0 0 0 - -    Fall Risk Fall Risk  08/21/2021 08/20/2020 08/18/2019 08/12/2018 08/04/2017  Falls in the past year? 0 0 0 0 No  Number falls in past yr: 0 0 0 - -  Injury with Fall? 0 0 0 - -  Risk for fall due to : No Fall Risks No Fall Risks No Fall Risks - -  Follow up Falls prevention discussed Falls evaluation completed;Falls prevention discussed Falls evaluation completed;Falls prevention discussed - -    FALL RISK PREVENTION PERTAINING TO THE HOME:  Any stairs in or around the home? Yes  If so, are there any without handrails? No  Home free of loose throw rugs in walkways, pet beds, electrical cords, etc? Yes  Adequate lighting in your home to reduce risk of falls? Yes   ASSISTIVE DEVICES UTILIZED TO PREVENT FALLS:  Life alert? No  Use of a cane, walker or w/c? Yes , walker Grab bars in the bathroom? No  Shower chair or bench in shower? Yes  Elevated toilet seat or a handicapped toilet? No   TIMED UP AND GO:  Was the test performed? No .    Cognitive Function: Normal cognitive status assessed by this Nurse Health Advisor. No  abnormalities found.   MMSE - Mini Mental State Exam 08/20/2020 08/18/2019 08/12/2018 08/04/2017 07/16/2016  Orientation to time 5 5 5 5 5   Orientation to Place 5 5 5 5 5   Registration 3 3 3 3 3   Attention/ Calculation 5 5 0 0 0  Recall 3 3 3 2 3   Recall-comments - - - unable to recall 1 of 3 words -  Language- name 2 objects - - 0 0 0  Language- repeat 1 1 1 1 1   Language- follow 3 step command - - 3 3 3   Language- read & follow direction - - 0 0 0  Write a sentence - - 0 0 0  Copy design - - 0 0 0  Total score - - 20 19 20         Immunizations Immunization History  Administered Date(s) Administered   Influenza, Seasonal, Injecte, Preservative Fre 07/01/2012   PFIZER(Purple Top)SARS-COV-2 Vaccination 09/05/2019, 09/26/2019, 03/27/2020   Pneumococcal Conjugate-13 07/15/2015   Pneumococcal Polysaccharide-23 06/25/2011   Td 11/20/1997, 06/11/2008   Tdap 04/29/2015    TDAP status: Up to date  Flu  Vaccine status: Due, Education has been provided regarding the importance of this vaccine. Advised may receive this vaccine at local pharmacy or Health Dept. Aware to provide a copy of the vaccination record if obtained from local pharmacy or Health Dept. Verbalized acceptance and understanding.  Pneumococcal vaccine status: Up to date  Covid-19 vaccine status: Information provided on how to obtain vaccines.   Qualifies for Shingles Vaccine? Yes   Zostavax completed No   Shingrix Completed?: No.    Education has been provided regarding the importance of this vaccine. Patient has been advised to call insurance company to determine out of pocket expense if they have not yet received this vaccine. Advised may also receive vaccine at local pharmacy or Health Dept. Verbalized acceptance and understanding.  Screening Tests Health Maintenance  Topic Date Due   Zoster Vaccines- Shingrix (1 of 2) Never done   COLONOSCOPY (Pts 45-58yrs Insurance coverage will need to be confirmed)  08/15/2018    COVID-19 Vaccine (4 - Booster for Pfizer series) 05/22/2020   MAMMOGRAM  09/13/2020   TETANUS/TDAP  04/28/2025   Pneumonia Vaccine 92+ Years old  Completed   DEXA SCAN  Completed   Hepatitis C Screening  Completed   HPV VACCINES  Aged Out    Health Maintenance  Health Maintenance Due  Topic Date Due   Zoster Vaccines- Shingrix (1 of 2) Never done   COLONOSCOPY (Pts 45-39yrs Insurance coverage will need to be confirmed)  08/15/2018   COVID-19 Vaccine (4 - Booster for [place] series) 05/22/2020   MAMMOGRAM  09/13/2020    Colorectal cancer screening: No longer required.   Mammogram status: patient plans on discussing with PCP at next visit   Bone Density status: patient plans on discussing with PCP at next visit  Lung Cancer Screening: (Low Dose CT Chest recommended if Age 45-80 years, 30 pack-year currently smoking OR have quit w/in 15years.) does not qualify.     Additional Screening:  Hepatitis C Screening: does qualify; Completed 07/16/16  Vision Screening: Recommended annual ophthalmology exams for early detection of glaucoma and other disorders of the eye. Is the patient up to date with their annual eye exam?  Yes  Who is the provider or what is the name of the office in which the patient attends annual eye exams? Dr. Kerin Ransom    Dental Screening: Recommended annual dental exams for proper oral hygiene  Community Resource Referral / Chronic Care Management: CRR required this visit?  No   CCM required this visit?  No      Plan:     I have personally reviewed and noted the following in the patients chart:   Medical and social history Use of alcohol, tobacco or illicit drugs  Current medications and supplements including opioid prescriptions.  Functional ability and status Nutritional status Physical activity Advanced directives List of other physicians Hospitalizations, surgeries, and ER visits in previous 12 months Vitals Screenings to include cognitive,  depression, and falls Referrals and appointments  In addition, I have reviewed and discussed with patient certain preventive protocols, quality metrics, and best practice recommendations. A written personalized care plan for preventive services as well as general preventive health recommendations were provided to patient.   Due to this being a telephonic visit, the after visit summary with patients personalized plan was offered to patient via mail or my-chart.  Patient would like to access on my-chart.      Loma Messing, LPN   08/28/7562   Nurse Health Advisor  Nurse  Notes: none

## 2021-08-21 ENCOUNTER — Other Ambulatory Visit: Payer: Self-pay

## 2021-08-21 ENCOUNTER — Ambulatory Visit (INDEPENDENT_AMBULATORY_CARE_PROVIDER_SITE_OTHER): Payer: Medicare HMO

## 2021-08-21 ENCOUNTER — Ambulatory Visit
Admission: RE | Admit: 2021-08-21 | Discharge: 2021-08-21 | Disposition: A | Discharge status: Home / Self Care | Payer: Medicare HMO | Source: Ambulatory Visit | Attending: Family Medicine | Admitting: Family Medicine

## 2021-08-21 VITALS — Ht 66.0 in | Wt 141.0 lb

## 2021-08-21 DIAGNOSIS — Z0000 Encounter for general adult medical examination without abnormal findings: Secondary | ICD-10-CM

## 2021-08-21 DIAGNOSIS — R519 Headache, unspecified: Secondary | ICD-10-CM | POA: Diagnosis not present

## 2021-08-21 IMAGING — CT CT HEAD W/O CM
4 series · 14 of 47 positions shown, 16 images · non-contrast
Comparison: None.

CLINICAL DATA: Headaches.



[Series 2: axial st head 5.00 ax · axial · 0.32mm/px · z∈[-520,-420]mm · 6 of 28 slices shown, 8 images]
[im 4/28  brain]
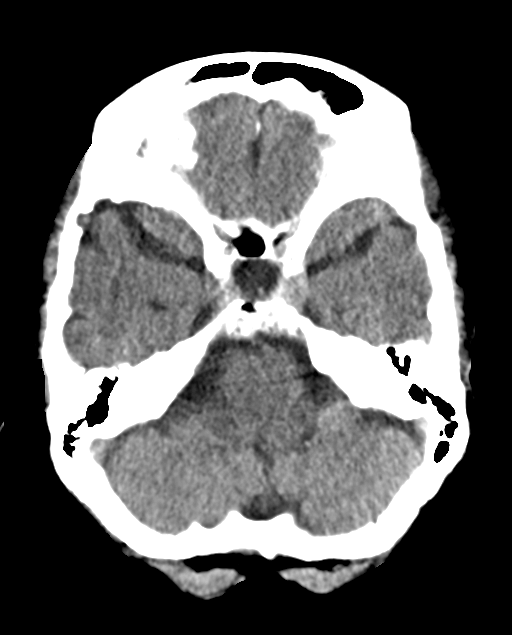
[im 4/28  bone]
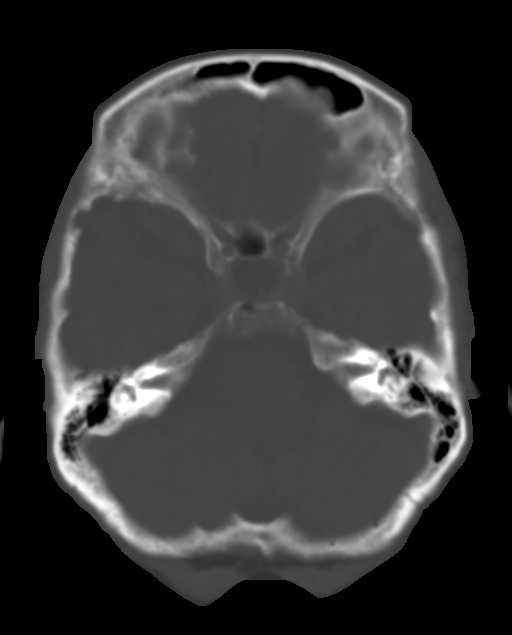
[im 8/28  brain]
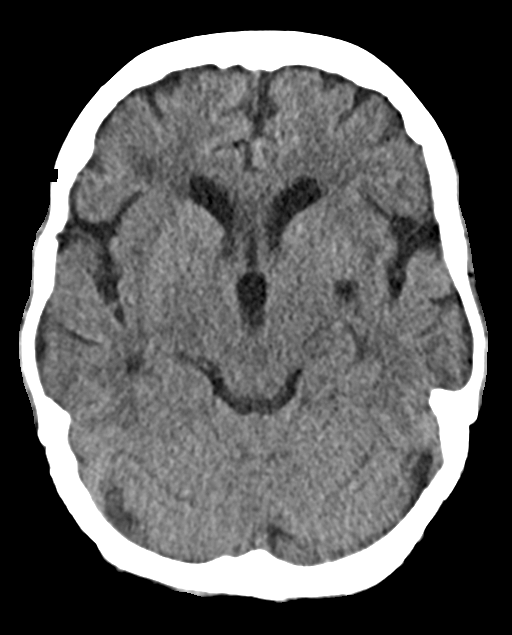
[im 12/28  brain]
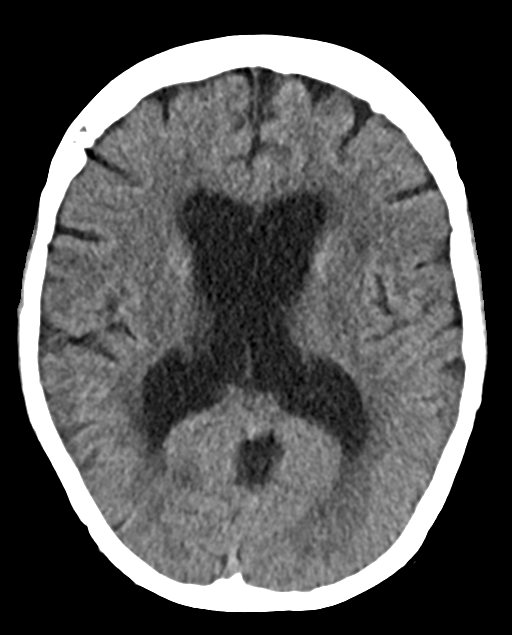
[im 16/28  brain]
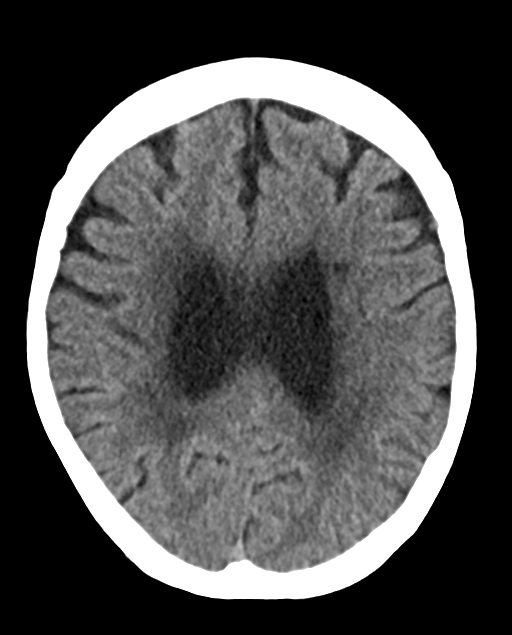
[im 20/28  brain]
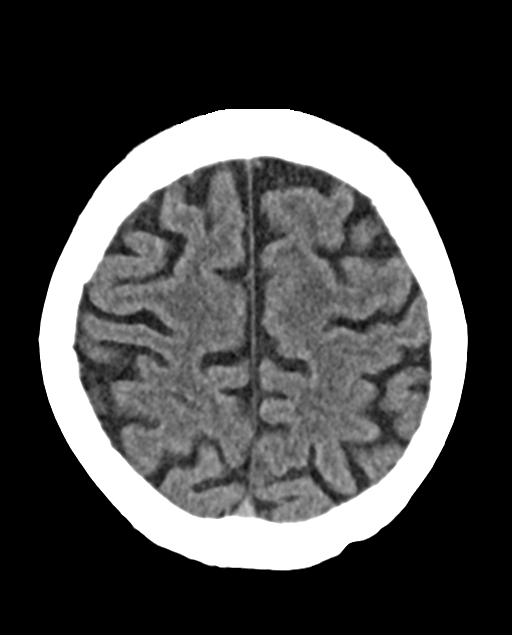
[im 20/28  bone]
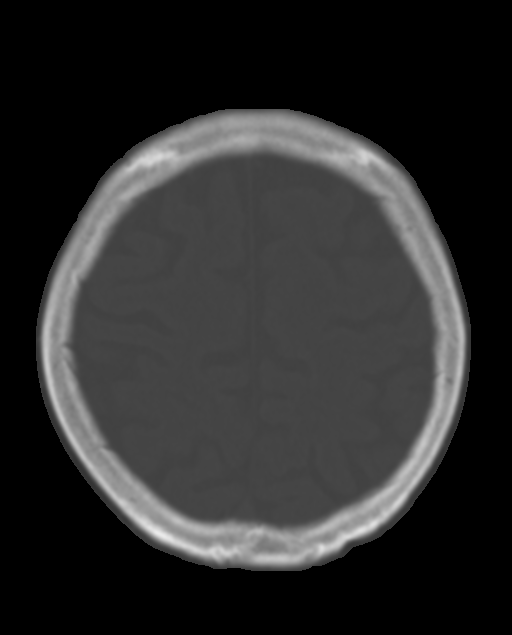
[im 24/28  brain]
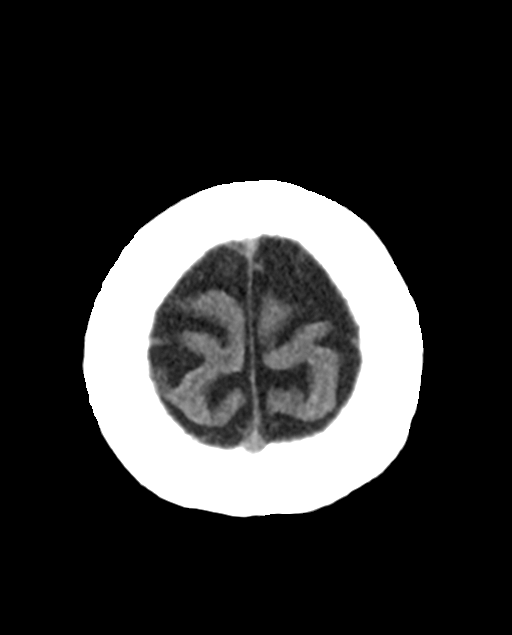

[Series 4: bone windows head 2.00 ax · axial · 0.32mm/px · z∈[-524,-510]mm · 2 of 72 slices shown]
[im 7/72  bone]
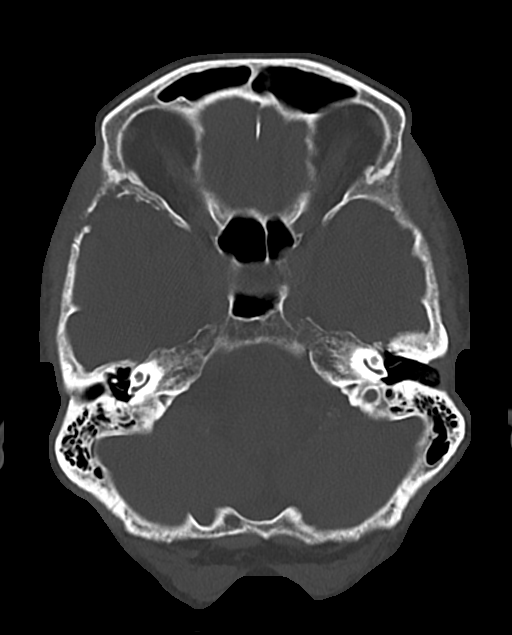
[im 14/72  bone]
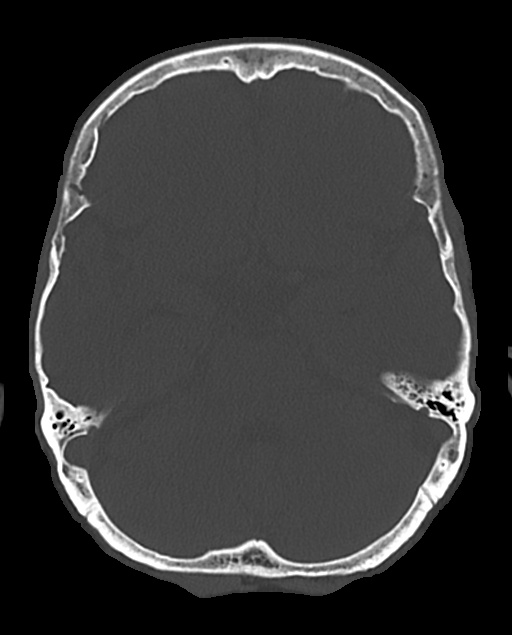

[Series 6: coronals head 3.00 cor · coronal · 0.28mm/px · 3 of 67 slices shown]
[im 23/67  brain]
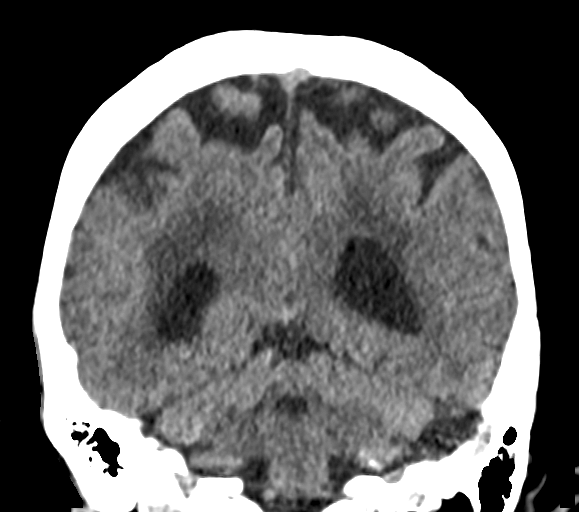
[im 30/67  brain]
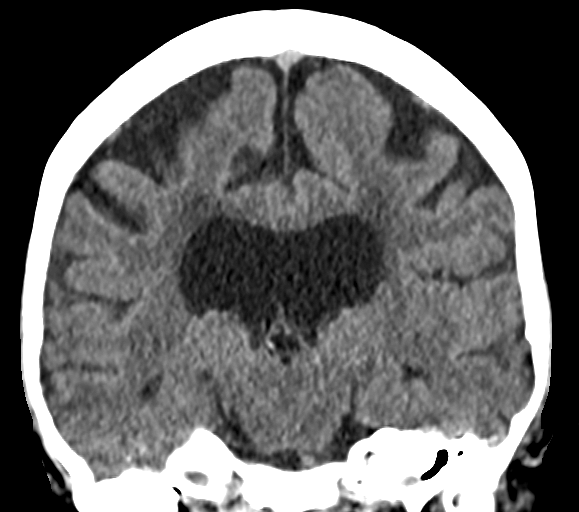
[im 37/67  brain]
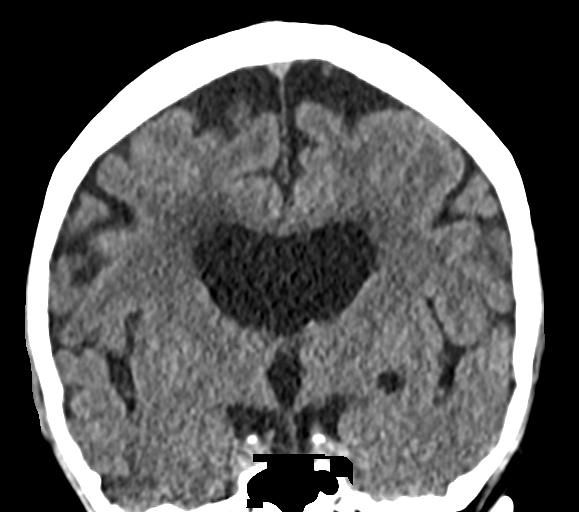

[Series 8: sagittals head 3.00 sag · sagittal · 0.28mm/px · 3 of 54 slices shown]
[im 18/54  brain]
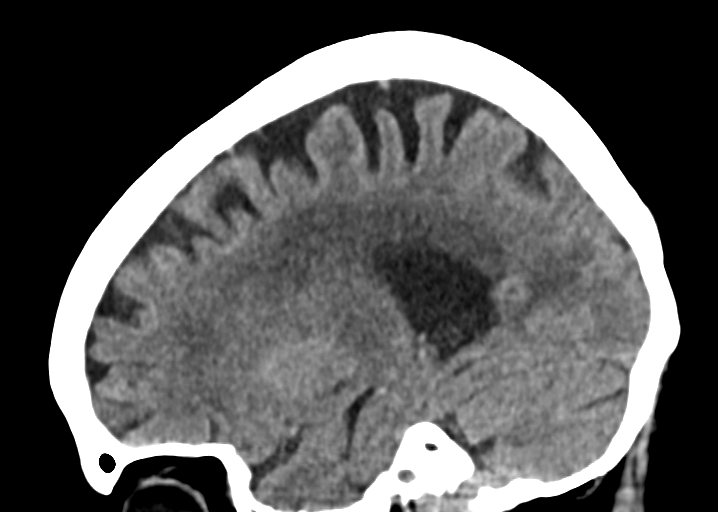
[im 27/54  brain]
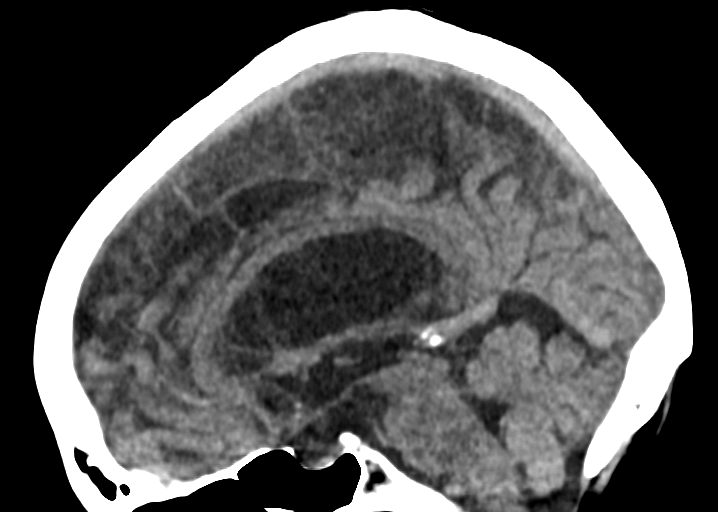
[im 36/54  brain]
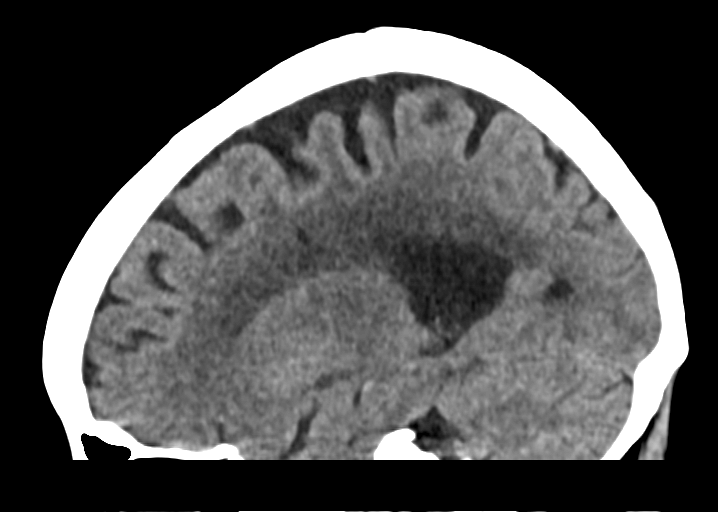

[14 of 47 positions shown; findings below may reference images not displayed]

FINDINGS: Brain: There is no evidence of an acute cortically based infarct,
intracranial hemorrhage, mass, midline shift, or extra-axial fluid
collection. Hypodensities in the cerebral white matter bilaterally
are nonspecific but compatible with mild-to-moderate chronic small
vessel ischemic disease. A lacunar infarct in the left corona
radiata is likely chronic. A hypodensity inferiorly in the left
basal ganglia likely reflects a dilated perivascular space. Mild
lateral ventriculomegaly is favored to reflect central predominant
cerebral atrophy.

Vascular: Calcified atherosclerosis at the skull base. No hyperdense
vessel.

Skull: No fracture or suspicious osseous lesion.

Sinuses/Orbits: Visualized paranasal sinuses and mastoid air cells
are clear. Visualized orbits are unremarkable.

Other: None.
IMPRESSION: 1. No evidence of acute intracranial abnormality.
2. Mild-to-moderate chronic small vessel ischemic disease and
cerebral atrophy.

## 2021-08-21 NOTE — Patient Instructions (Signed)
[name] , Thank you for taking time to complete your Medicare Wellness Visit. I appreciate your ongoing commitment to your health goals. Please review the following plan we discussed and let me know if I can assist you in the future.   Screening recommendations/referrals: Colonoscopy: no longer required  Mammogram: due, last completed 09/14/19, per our conversation you plan on discussing with PCP Bone Density: last completed 08/22/13, discuss with PCP Recommended yearly ophthalmology/optometry visit for glaucoma screening and checkup Recommended yearly dental visit for hygiene and checkup  Vaccinations: Influenza vaccine: Due-May obtain vaccine at our office or your local pharmacy. Pneumococcal vaccine: up to date Tdap vaccine: up to date, due 04/28/25 Shingles vaccine: Discuss with pharmacy   Covid-19:newest booster available at your local pharmacy  Advanced directives: copy on file   Conditions/risks identified: see problem list   Next appointment: Follow up in one year for your annual wellness visit    Preventive Care 38 Years and Older, Female Preventive care refers to lifestyle choices and visits with your health care provider that can promote health and wellness. What does preventive care include? A yearly physical exam. This is also called an annual well check. Dental exams once or twice a year. Routine eye exams. Ask your health care provider how often you should have your eyes checked. Personal lifestyle choices, including: Daily care of your teeth and gums. Regular physical activity. Eating a healthy diet. Avoiding tobacco and drug use. Limiting alcohol use. Practicing safe sex. Taking low-dose aspirin every day. Taking vitamin and mineral supplements as recommended by your health care provider. What happens during an annual well check? The services and screenings done by your health care provider during your annual well check will depend on your age, overall health,  lifestyle risk factors, and family history of disease. Counseling  Your health care provider may ask you questions about your: Alcohol use. Tobacco use. Drug use. Emotional well-being. Home and relationship well-being. Sexual activity. Eating habits. History of falls. Memory and ability to understand (cognition). Work and work Statistician. Reproductive health. Screening  You may have the following tests or measurements: Height, weight, and BMI. Blood pressure. Lipid and cholesterol levels. These may be checked every 5 years, or more frequently if you are over 48 years old. Skin check. Lung cancer screening. You may have this screening every year starting at age 96 if you have a 30-pack-year history of smoking and currently smoke or have quit within the past 15 years. Fecal occult blood test (FOBT) of the stool. You may have this test every year starting at age 33. Flexible sigmoidoscopy or colonoscopy. You may have a sigmoidoscopy every 5 years or a colonoscopy every 10 years starting at age 59. Hepatitis C blood test. Hepatitis B blood test. Sexually transmitted disease (STD) testing. Diabetes screening. This is done by checking your blood sugar (glucose) after you have not eaten for a while (fasting). You may have this done every 1-3 years. Bone density scan. This is done to screen for osteoporosis. You may have this done starting at age 22. Mammogram. This may be done every 1-2 years. Talk to your health care provider about how often you should have regular mammograms. Talk with your health care provider about your test results, treatment options, and if necessary, the need for more tests. Vaccines  Your health care provider may recommend certain vaccines, such as: Influenza vaccine. This is recommended every year. Tetanus, diphtheria, and acellular pertussis (Tdap, Td) vaccine. You may need a Td  booster every 10 years. Zoster vaccine. You may need this after age  103. Pneumococcal 13-valent conjugate (PCV13) vaccine. One dose is recommended after age 78. Pneumococcal polysaccharide (PPSV23) vaccine. One dose is recommended after age 31. Talk to your health care provider about which screenings and vaccines you need and how often you need them. This information is not intended to replace advice given to you by your health care provider. Make sure you discuss any questions you have with your health care provider. Document Released: 07/05/2015 Document Revised: 02/26/2016 Document Reviewed: 04/09/2015 Elsevier Interactive Patient Education  2017 [employer] Prevention in the Home Falls can cause injuries. They can happen to people of all ages. There are many things you can do to make your home safe and to help prevent falls. What can I do on the outside of my home? Regularly fix the edges of walkways and driveways and fix any cracks. Remove anything that might make you trip as you walk through a door, such as a raised step or threshold. Trim any bushes or trees on the path to your home. Use bright outdoor lighting. Clear any walking paths of anything that might make someone trip, such as rocks or tools. Regularly check to see if handrails are loose or broken. Make sure that both sides of any steps have handrails. Any raised decks and porches should have guardrails on the edges. Have any leaves, snow, or ice cleared regularly. Use sand or salt on walking paths during winter. Clean up any spills in your garage right away. This includes oil or grease spills. What can I do in the bathroom? Use night lights. Install grab bars by the toilet and in the tub and shower. Do not use towel bars as grab bars. Use non-skid mats or decals in the tub or shower. If you need to sit down in the shower, use a plastic, non-slip stool. Keep the floor dry. Clean up any water that spills on the floor as soon as it happens. Remove soap buildup in the tub or shower  regularly. Attach bath mats securely with double-sided non-slip rug tape. Do not have throw rugs and other things on the floor that can make you trip. What can I do in the bedroom? Use night lights. Make sure that you have a light by your bed that is easy to reach. Do not use any sheets or blankets that are too big for your bed. They should not hang down onto the floor. Have a firm chair that has side arms. You can use this for support while you get dressed. Do not have throw rugs and other things on the floor that can make you trip. What can I do in the kitchen? Clean up any spills right away. Avoid walking on wet floors. Keep items that you use a lot in easy-to-reach places. If you need to reach something above you, use a strong step stool that has a grab bar. Keep electrical cords out of the way. Do not use floor polish or wax that makes floors slippery. If you must use wax, use non-skid floor wax. Do not have throw rugs and other things on the floor that can make you trip. What can I do with my stairs? Do not leave any items on the stairs. Make sure that there are handrails on both sides of the stairs and use them. Fix handrails that are broken or loose. Make sure that handrails are as long as the stairways. Check any carpeting  to make sure that it is firmly attached to the stairs. Fix any carpet that is loose or worn. Avoid having throw rugs at the top or bottom of the stairs. If you do have throw rugs, attach them to the floor with carpet tape. Make sure that you have a light switch at the top of the stairs and the bottom of the stairs. If you do not have them, ask someone to add them for you. What else can I do to help prevent falls? Wear shoes that: Do not have high heels. Have rubber bottoms. Are comfortable and fit you well. Are closed at the toe. Do not wear sandals. If you use a stepladder: Make sure that it is fully opened. Do not climb a closed stepladder. Make sure that  both sides of the stepladder are locked into place. Ask someone to hold it for you, if possible. Clearly mark and make sure that you can see: Any grab bars or handrails. First and last steps. Where the edge of each step is. Use tools that help you move around (mobility aids) if they are needed. These include: Canes. Walkers. Scooters. Crutches. Turn on the lights when you go into a dark area. Replace any light bulbs as soon as they burn out. Set up your furniture so you have a clear path. Avoid moving your furniture around. If any of your floors are uneven, fix them. If there are any pets around you, be aware of where they are. Review your medicines with your doctor. Some medicines can make you feel dizzy. This can increase your chance of falling. Ask your doctor what other things that you can do to help prevent falls. This information is not intended to replace advice given to you by your health care provider. Make sure you discuss any questions you have with your health care provider. Document Released: 04/04/2009 Document Revised: 11/14/2015 Document Reviewed: 07/13/2014 Elsevier Interactive Patient Education  2017 [employer].

## 2021-08-22 ENCOUNTER — Other Ambulatory Visit: Payer: Self-pay | Admitting: Family Medicine

## 2021-08-22 MED ORDER — HYDROCODONE-ACETAMINOPHEN 5-325 MG PO TABS: 1.0000 | ORAL_TABLET | Freq: Four times a day (QID) | ORAL | 0 refills | Status: DC | PRN

## 2021-08-25 ENCOUNTER — Encounter: Payer: Self-pay | Admitting: Family Medicine

## 2021-08-25 ENCOUNTER — Encounter: Payer: Medicare HMO | Admitting: Physical Therapy

## 2021-08-25 ENCOUNTER — Other Ambulatory Visit: Payer: Self-pay

## 2021-08-25 ENCOUNTER — Ambulatory Visit (INDEPENDENT_AMBULATORY_CARE_PROVIDER_SITE_OTHER): Payer: Medicare HMO | Admitting: Family Medicine

## 2021-08-25 VITALS — BP 132/80 | HR 74 | Temp 96.7°F | Ht 66.0 in | Wt 139.0 lb

## 2021-08-25 DIAGNOSIS — Z7189 Other specified counseling: Secondary | ICD-10-CM

## 2021-08-25 DIAGNOSIS — Z0000 Encounter for general adult medical examination without abnormal findings: Secondary | ICD-10-CM

## 2021-08-25 DIAGNOSIS — M545 Low back pain, unspecified: Secondary | ICD-10-CM

## 2021-08-25 NOTE — Patient Instructions (Addendum)
I will await the follow up notes and imaging from Dr. Annette Stable.  ?Don't change your meds for now.  ?Let me know when you need more pain medicine.   ?Take care.  Glad to see you. ? ?If your follow up scans are normal, then we may need to get input from neurology.   ?

## 2021-08-25 NOTE — Progress Notes (Signed)
This visit occurred during the SARS-CoV-2 public health emergency.  Safety protocols were in place, including screening questions prior to the visit, additional usage of staff PPE, and extensive cleaning of exam room while observing appropriate contact time as indicated for disinfecting solutions. ? ?Back pain.  Detailed conversation about prev sx and w/u. Still with R sided lower back pain and R buttock pain.  L back and leg are improved.  R leg pain gets better when she gets up and moves more.  Hydrocodone helps the pain a little. No ADE on med.  Done with steroid taper.  No ADE on med.   ? ?D/w pt about prev sx.  She had similar sx after prev bladder surgery years ago but it resolved and was short lived.  No urinary sx now but she has a leg bag/cath still in with urology f/u pending.  She has f/u and extra imaging pending with Dr. Annette Stable. No FCNAVD.  No blood in stool.  No blood in bag.   ? ?CT head d/w pt.   ?1. No evidence of acute intracranial abnormality. ?2. 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 ?  ?Vaccines d/w pt.   ?Defer DXA currently given her situation.   ?Mammogram to be done when possible.   ?Colonoscopy to be done when possible.   ?Living will d/w pt.  Husband would be designated if she were incapacitated ? ?Defer HLD tx at this point.   ? ?Meds, vitals, and allergies reviewed.  ? ?ROS: Per HPI unless specifically indicated in ROS section  ? ?GEN: nad, alert and oriented ?HEENT: ncat ?NECK: supple w/o LA ?CV: rrr ?PULM: ctab, no inc wob ?ABD: soft, +bs ?EXT: no edema ?SKIN: no acute rash ?S/S grossly wnl x4.  Laying on exam table to help with pain but able to bear weight and walk with rolling walker.   ?

## 2021-08-27 ENCOUNTER — Encounter: Payer: Medicare HMO | Admitting: Physical Therapy

## 2021-08-27 NOTE — Assessment & Plan Note (Addendum)
Back pain improved, still with leg and buttock pain.   ? ?With f/u imaging pending with Dr. Annette Stable.  CT head w/o acute change, dw pt.   ? ?D/w pt that if her follow up scans are normal, then we may need to get input from neurology.   ? ?She has urology f/u pending also, will await input.   ? ?Continue prn hydrocodone for pain now.   ?

## 2021-08-27 NOTE — Assessment & Plan Note (Signed)
Living will d/w pt.  Husband would be designated if she were incapacitated ?

## 2021-08-27 NOTE — Assessment & Plan Note (Signed)
?  Vaccines d/w pt.   ?Defer DXA currently given her situation.   ?Mammogram to be done when possible.   ?Colonoscopy to be done when possible.   ?Living will d/w pt.  Husband would be designated if she were incapacitated ?

## 2021-08-28 ENCOUNTER — Encounter: Payer: Medicare HMO | Admitting: Physical Therapy

## 2021-08-28 ENCOUNTER — Telehealth: Payer: Self-pay | Admitting: Family Medicine

## 2021-08-28 NOTE — Telephone Encounter (Signed)
?  Encourage patient to contact the pharmacy for refills or they can request refills through [facility] ? ?LAST APPOINTMENT DATE:  Please schedule appointment if longer than 1 year ? ?NEXT APPOINTMENT DATE: ? ?MEDICATION:HYDROcodone-acetaminophen (NORCO) 5-325 MG tablet ? ?Is the patient out of medication?  ? ?[place], [place] - 1943  ? ?Let patient know to contact pharmacy at the end of the day to make sure medication is ready. ? ?Please notify patient to allow 48-72 hours to process ? ?CLINICAL FILLS OUT ALL BELOW:  ? ?LAST REFILL: ? ?QTY: ? ?REFILL DATE: ? ? ? ?OTHER COMMENTS:  ? ? ?Okay for refill? ? ?Please advise ? ? ? ? ?

## 2021-08-29 ENCOUNTER — Encounter: Payer: Medicare HMO | Admitting: Physical Therapy

## 2021-08-29 MED ORDER — HYDROCODONE-ACETAMINOPHEN 5-325 MG PO TABS: 1.0000 | ORAL_TABLET | Freq: Four times a day (QID) | ORAL | 0 refills | Status: DC | PRN

## 2021-08-29 NOTE — Telephone Encounter (Signed)
Patient notified rx was sent 

## 2021-08-29 NOTE — Telephone Encounter (Signed)
Sent. Thanks.   

## 2021-08-29 NOTE — Addendum Note (Signed)
Addended by: Tonia Ghent on: 08/29/2021 01:44 PM ? ? Modules accepted: Orders ? ?

## 2021-08-29 NOTE — Telephone Encounter (Signed)
LOV - 08/25/21 ?NOV - not scheduled ?RF - 08/22/21 #20/0 ?

## 2021-08-29 NOTE — Telephone Encounter (Signed)
Pt called in to know status of RX refilled . ?

## 2021-09-01 DIAGNOSIS — R338 Other retention of urine: Secondary | ICD-10-CM | POA: Diagnosis not present

## 2021-09-02 ENCOUNTER — Encounter: Payer: Medicare HMO | Admitting: Physical Therapy

## 2021-09-02 DIAGNOSIS — M4714 Other spondylosis with myelopathy, thoracic region: Secondary | ICD-10-CM | POA: Diagnosis not present

## 2021-09-02 DIAGNOSIS — M5416 Radiculopathy, lumbar region: Secondary | ICD-10-CM | POA: Diagnosis not present

## 2021-09-03 ENCOUNTER — Encounter: Payer: Medicare HMO | Admitting: Physical Therapy

## 2021-09-04 ENCOUNTER — Encounter: Payer: Medicare HMO | Admitting: Physical Therapy

## 2021-09-05 ENCOUNTER — Other Ambulatory Visit: Payer: Self-pay | Admitting: Family Medicine

## 2021-09-05 ENCOUNTER — Encounter: Payer: Medicare HMO | Admitting: Physical Therapy

## 2021-09-05 MED ORDER — HYDROCODONE-ACETAMINOPHEN 5-325 MG PO TABS: 1.0000 | ORAL_TABLET | Freq: Four times a day (QID) | ORAL | 0 refills | Status: DC | PRN

## 2021-09-08 ENCOUNTER — Encounter: Payer: Medicare HMO | Admitting: Physical Therapy

## 2021-09-09 ENCOUNTER — Encounter: Payer: Medicare HMO | Admitting: Physical Therapy

## 2021-09-10 DIAGNOSIS — M4714 Other spondylosis with myelopathy, thoracic region: Secondary | ICD-10-CM | POA: Diagnosis not present

## 2021-09-11 ENCOUNTER — Other Ambulatory Visit: Payer: Self-pay

## 2021-09-11 ENCOUNTER — Encounter: Payer: Medicare HMO | Admitting: Physical Therapy

## 2021-09-11 NOTE — Telephone Encounter (Signed)
Pt is still in a lot of pain and needs another refill of the hydrocodone. States she is having to take it every 6 hours usually up 3 a day. Sometimes needs a 4th. Send to [facility]. ? ?Last filled 09-05-21 #20 ?Last OV 08-25-21 ?No Future OV ?

## 2021-09-12 MED ORDER — HYDROCODONE-ACETAMINOPHEN 5-325 MG PO TABS: 1.0000 | ORAL_TABLET | Freq: Four times a day (QID) | ORAL | 0 refills | Status: DC | PRN

## 2021-09-12 NOTE — Telephone Encounter (Signed)
Patient advised rx was sent. ?

## 2021-09-12 NOTE — Telephone Encounter (Signed)
Sent. Thanks.  ?Notify pt.  I updated the rx for 30 tabs.   ?

## 2021-09-15 ENCOUNTER — Encounter: Payer: Medicare HMO | Admitting: Physical Therapy

## 2021-09-16 ENCOUNTER — Encounter: Payer: Medicare HMO | Admitting: Physical Therapy

## 2021-09-16 DIAGNOSIS — M546 Pain in thoracic spine: Secondary | ICD-10-CM | POA: Diagnosis not present

## 2021-09-16 DIAGNOSIS — M4714 Other spondylosis with myelopathy, thoracic region: Secondary | ICD-10-CM | POA: Diagnosis not present

## 2021-09-17 DIAGNOSIS — M4714 Other spondylosis with myelopathy, thoracic region: Secondary | ICD-10-CM | POA: Diagnosis not present

## 2021-09-17 DIAGNOSIS — R030 Elevated blood-pressure reading, without diagnosis of hypertension: Secondary | ICD-10-CM | POA: Diagnosis not present

## 2021-09-18 ENCOUNTER — Other Ambulatory Visit (HOSPITAL_COMMUNITY): Payer: Self-pay | Admitting: Neurosurgery

## 2021-09-18 ENCOUNTER — Encounter: Payer: Medicare HMO | Admitting: Physical Therapy

## 2021-09-18 ENCOUNTER — Other Ambulatory Visit: Payer: Self-pay | Admitting: Neurosurgery

## 2021-09-18 DIAGNOSIS — R918 Other nonspecific abnormal finding of lung field: Secondary | ICD-10-CM

## 2021-09-19 ENCOUNTER — Encounter: Payer: Self-pay | Admitting: Family Medicine

## 2021-09-19 ENCOUNTER — Encounter: Payer: Medicare HMO | Admitting: Physical Therapy

## 2021-09-19 DIAGNOSIS — N8111 Cystocele, midline: Secondary | ICD-10-CM | POA: Diagnosis not present

## 2021-09-19 DIAGNOSIS — R338 Other retention of urine: Secondary | ICD-10-CM | POA: Diagnosis not present

## 2021-09-21 ENCOUNTER — Telehealth: Payer: Self-pay | Admitting: Family Medicine

## 2021-09-21 DIAGNOSIS — R918 Other nonspecific abnormal finding of lung field: Secondary | ICD-10-CM

## 2021-09-21 DIAGNOSIS — I2510 Atherosclerotic heart disease of native coronary artery without angina pectoris: Secondary | ICD-10-CM

## 2021-09-21 NOTE — Telephone Encounter (Signed)
Please check with Dr. Marchelle Folks office.  Patient stated that she had a lung lesion incidentally noted that needed follow-up.  See her MyChart message.  Please see what details she can get and let me know.  Thanks. ?

## 2021-09-22 ENCOUNTER — Ambulatory Visit: Payer: Medicare HMO | Attending: Internal Medicine

## 2021-09-22 ENCOUNTER — Telehealth: Payer: Self-pay | Admitting: Family Medicine

## 2021-09-22 DIAGNOSIS — R2681 Unsteadiness on feet: Secondary | ICD-10-CM | POA: Diagnosis not present

## 2021-09-22 DIAGNOSIS — M6281 Muscle weakness (generalized): Secondary | ICD-10-CM | POA: Diagnosis not present

## 2021-09-22 DIAGNOSIS — R269 Unspecified abnormalities of gait and mobility: Secondary | ICD-10-CM | POA: Insufficient documentation

## 2021-09-22 DIAGNOSIS — M5416 Radiculopathy, lumbar region: Secondary | ICD-10-CM | POA: Diagnosis not present

## 2021-09-22 MED ORDER — HYDROCODONE-ACETAMINOPHEN 5-325 MG PO TABS: 1.0000 | ORAL_TABLET | Freq: Four times a day (QID) | ORAL | 0 refills | Status: DC | PRN

## 2021-09-22 NOTE — Telephone Encounter (Signed)
Sent. Thanks.   

## 2021-09-22 NOTE — Therapy (Signed)
[facility] ?[address] ?[place], [place], [address] ?Phone: [phone]   Fax:  [phone] ? ?Physical Therapy Treatment ? ?Patient Details  ?Name: Desiree Ortega ?MRN: [id number] ?Date of Birth: 11/25/1945 ?No data recorded ? ?Encounter Date: 09/22/2021 ? ? PT End of Session - 09/22/21 1628   ? ? Visit Number 4   ? Number of Visits 17   ? Date for PT Re-Evaluation 10/07/21   ? Authorization Type [employer] Medicare   ? Authorization Time Period 08/12/21-10/07/21   ? Progress Note Due on Visit 10   ? PT Start Time 1603   ? PT Stop Time 0263   ? PT Time Calculation (min) 40 min   ? Activity Tolerance Patient tolerated treatment well;No increased pain   ? Behavior During Therapy [facility] for tasks assessed/performed   ? ?  ?  ? ?  ? ? ?Past Medical History:  ?Diagnosis Date  ? Allergy   ? 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 Evorn Gong  ? Diverticulosis   ? Moderate  ? Female bladder prolapse   ? H/O cold sores   ? History of colon polyps   ? History of kidney stones   ? Hyperlipidemia   ? Migraine with aura   ? Osteopenia   ? prev on fosamax for a few years then started evista at age ~28, T score improved  from -2.29/-1.96 to -1.8/-1.7 as of 06/2011, repeat DXA done 2015  ? ? ?Past Surgical History:  ?Procedure Laterality Date  ? BREAST BIOPSY Left   ? benign  ? CATARACT EXTRACTION W/ INTRAOCULAR LENS IMPLANT Left 11/2016  ? CATARACT EXTRACTION W/ INTRAOCULAR LENS IMPLANT Right 12/22/2016  ? COLONOSCOPY  08/15/2013  ? cyst on back    ? 2 times  ? cyst on eyelid    ? right eye  ? CYSTOCELE REPAIR N/A 02/01/2018  ? Procedure: ANTERIOR REPAIR (CYSTOCELE);  Surgeon: Bjorn Loser, MD;  Location: [facility];  Service: Urology;  Laterality: N/A;  ? CYSTOSCOPY N/A 02/01/2018  ? Procedure: cystoscopy;  Surgeon: Bjorn Loser, MD;  Location: [facility];  Service: Urology;  Laterality: N/A;  ? LAPAROSCOPIC VAGINAL HYSTERECTOMY WITH  SALPINGO OOPHORECTOMY Bilateral 02/01/2018  ? Procedure: LAPAROSCOPIC ASSISTED VAGINAL HYSTERECTOMY WITH BILATERAL SALPINGO OOPHORECTOMY;  Surgeon: Servando Salina, MD;  Location: [facility];  Service: Gynecology;  Laterality: Bilateral;  ? SQUAMOUS CELL CARCINOMA EXCISION    ? on back/removed 2 times/and thigh  ? SQUAMOUS CELL CARCINOMA EXCISION Left 07/15/2016  ? left lower calf  ? SSC removed  1988  ? Birthmark removal partial 1/3  ? TONSILLECTOMY  1950  ? TUBAL LIGATION  1980's  ? ? ?There were no vitals filed for this visit. ? ? Subjective Assessment - 09/22/21 1616   ? ? Subjective Pt returns to clinic after 4 weeks hiatus, reports she needed a repsite due to being so busy with so many differen tdoctors appointments. 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 Pt's doctor asked her to resume therapy.Since last session, she has tried her HEP piece meal, but largely has constant pain when not in supine. Pt has a lot of pain relief when performing DKTC stretch.   ? Pertinent History 76 y.o. female pnt presents with bilateral hip pain with radiating symptoms down the back of bilateral legs to  her knees that began Jan. 23rd with insidious onset. Pnt descibes pain as "burning" in her hips with best a 0/10 (on pain medication), worst 10/10, and currently 5/10 on NPS. Pnt is limited in her usual recreational activities (yoga, walking, etc) and was previously very active. Her home setup includes 14 stairs with bilateral railing and a shower with a railing for balance that she installed for her husband following his stroke. She currently lives with her husband. Aggrevating factors include sitting for more than 1 minute, walking and/or standing for more than 30 minutes, bed mobility, and getting into and out of a car. Easing factors include laying flat on her back or in hooklying, reclining in a chair, and taking medication. Pnt is scheduled for a  consultation with a neurologist on March 1st and currently has a catheter (due to urinary retention) which she reports is helping her pain. Pnt has had pelvic floor PT in the past and would be interested in that option if it is more appropriate. Pnt denys saddle anaesthesia, n/v, fever, or night pain. Pnt has noticed she has lost about 8 pounds since last January. Her goal for physical therapy is to decrease her pain.   ? Currently in Pain? Yes   ? Pain Score --   4-5 in bilat buttocks adn sciatic distrution to mid calf  ? ?  ?  ? ?  ? ?Intervention  ?-Seated AA/ROM Nustep: Level 2 x5 minutes, Seat 9, arms 12 ?-AMB 458ft overground c 4WW: good tall posture, 1:58, no pain exacerbation, no frank unsteadiness ?-STS from chair + 2 airex pads 2x10 ?-forward flexion stretch on silver ball 2x60sec, 1x60sec on rollator ?-Seated FABER stretch without aggressive force 2x60sec bilat (more painful on Rt but perceived as beneficial)  ?-seated marching 2x30  ? ?HEP review and handout update  ? ? PT Education - 09/22/21 1628   ? ? Education Details exercise modificaiton for home   ? Person(s) Educated Patient   ? Methods Explanation   ? Comprehension Verbalized understanding   ? ?  ?  ? ?  ? ? ? PT Short Term Goals - 08/12/21 1557   ? ?  ? PT SHORT TERM GOAL #1  ? Title Pnt will be fully independent with HEP in order to progress at home to comeplete ADLs.   ? Baseline 08/12/21 HEP given   ? Time 4   ? Period Weeks   ? Status New   ? Target Date 09/09/21   ? ?  ?  ? ?  ? ? ? ? PT Long Term Goals - 08/12/21 1558   ? ?  ? PT LONG TERM GOAL #1  ? Title Pnt will decrease max NPS value from 10/10 to 8/10 to show clinically significant reduction of pain.   ? Baseline 08/12/21 10/10NPS   ? Time 8   ? Period Weeks   ? Status New   ? Target Date 10/07/21   ?  ? PT LONG TERM GOAL #2  ? Title Pnt will increase 10 meter walk test with RW to 1.26-1.13 m/s in order to meet age norm value to show safe community ambulation.   ? Baseline 08/12/21  .27m/s   ? Time 8   ? Period Weeks   ? Status New   ? Target Date 10/07/21   ?  ? PT LONG TERM GOAL #3  ? Title Pnt will increase SLS time on left leg to >10sec in order to show clinically significant decrease  in community fall risk.   ? Baseline 08/12/21 L: 9.16 seconds   ? Time 8   ? Period Weeks   ? Status New   ? Target Date 10/07/21   ?  ? PT LONG TERM GOAL #4  ? Title Pnt will increase bilateral hip flexion MMT by one muscle grade in order to show significant LE strength gain to complete functional and community ADLs.   ? Baseline 08/12/21 bilateral 3/5   ? Time 8   ? Period Weeks   ? Status New   ? Target Date 10/07/21   ? ?  ?  ? ?  ? ? ? ? ? ? ? ? Plan - 09/22/21 1631   ? ? Clinical Impression Statement Modified many exercies since recent medial updates, now avoiding extension based exercses and sciatic tensioners. Pt able to tolerate flexion based acitivty with good relief of symptoms.   ? Personal Factors and Comorbidities Age;Past/Current Experience;Time since onset of injury/illness/exacerbation   ? Examination-Activity Limitations Bathing;Continence;Stairs;Bed Mobility;Dressing;Stand;Bend;Hygiene/Grooming;Sit;Toileting;Lift;Transfers;Carry;Locomotion Level   ? Examination-Participation Restrictions Church;Driving;Volunteer;Cleaning;Interpersonal Relationship;Meal Prep;Yard Work;Community Activity;Laundry;Shop   ? Stability/Clinical Decision Making Evolving/Moderate complexity   ? Clinical Decision Making Moderate   ? Rehab Potential Good   ? PT Frequency 2x / week   ? PT Duration 8 weeks   ? PT Treatment/Interventions ADLs/Self Care Home Management;Biofeedback;Cryotherapy;Electrical Stimulation;Moist Heat;Iontophoresis 4mg /ml Dexamethasone;Traction;Ultrasound;Gait training;Stair training;Functional mobility training;Therapeutic activities;Therapeutic exercise;Balance training;Neuromuscular re-education;Patient/family education;Orthotic Fit/Training;Wheelchair mobility training;Manual techniques;Passive  range of motion;Dry needling;Energy conservation;Splinting;Taping;Joint Manipulations;Spinal Manipulations   ? PT Next Visit Plan FU on HEP updates   ? PT Home Exercise Plan 09/22/21: DC'd sciatic flossing, pro

## 2021-09-22 NOTE — Telephone Encounter (Signed)
?  Encourage patient to contact the pharmacy for refills or they can request refills through [facility] ? ?Did the patient contact the pharmacy:  N ? ? ?LAST APPOINTMENT DATE:  3.6.23 ? ?NEXT APPOINTMENT DATE: not scheduled ? ?MEDICATION:  HYDROcodone-acetaminophen (NORCO) 5-325 MG tablet ? ?Is the patient out of medication?  Y ? ?If not, how much is left? No, she takes 3 pills daily ? ?Is this a 90 day supply:  ? ?PHARMACY:  ?[place], [place] Phone:  [phone]  ?Fax:  [phone]  ?  ? ? ?Let patient know to contact pharmacy at the end of the day to make sure medication is ready. ? ?Please notify patient to allow 48-72 hours to process ?  ?

## 2021-09-23 NOTE — Telephone Encounter (Signed)
Also all imaging done for Dr. Annette Stable would have been done in [facility] system unless patient request outside due to insurance.  ?

## 2021-09-23 NOTE — Telephone Encounter (Signed)
Called Dr.Pool office left message to call office to get more information regarding imaging.  ?

## 2021-09-24 ENCOUNTER — Ambulatory Visit: Payer: Medicare HMO

## 2021-09-24 DIAGNOSIS — M6281 Muscle weakness (generalized): Secondary | ICD-10-CM | POA: Diagnosis not present

## 2021-09-24 DIAGNOSIS — R2681 Unsteadiness on feet: Secondary | ICD-10-CM | POA: Diagnosis not present

## 2021-09-24 DIAGNOSIS — M5416 Radiculopathy, lumbar region: Secondary | ICD-10-CM | POA: Diagnosis not present

## 2021-09-24 DIAGNOSIS — R269 Unspecified abnormalities of gait and mobility: Secondary | ICD-10-CM | POA: Diagnosis not present

## 2021-09-24 NOTE — Therapy (Signed)
[place] [facility] ?[address] ?[place], [place], [address] ?Phone: [phone]   Fax:  [phone] ? ?Physical Therapy Treatment ? ?Patient Details  ?Name: Desiree Ortega ?MRN: [id number] ?Date of Birth: September 23, 1945 ?No data recorded ? ?Encounter Date: 09/24/2021 ? ? PT End of Session - 09/24/21 1611   ? ? Visit Number 5   ? Number of Visits 17   ? Date for PT Re-Evaluation 10/07/21   ? Authorization Type [employer]na Medicare   ? Authorization Time Period 08/12/21-10/07/21   ? Progress Note Due on Visit 10   ? PT Start Time 1602   ? PT Stop Time 1640   ? PT Time Calculation (min) 38 min   ? Activity Tolerance Patient tolerated treatment well;No increased pain   ? Behavior During Therapy [facility] for tasks assessed/performed   ? ?  ?  ? ?  ? ? ?Past Medical History:  ?Diagnosis Date  ? Allergy   ? 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 Evorn Gong  ? Diverticulosis   ? Moderate  ? Female bladder prolapse   ? H/O cold sores   ? History of colon polyps   ? History of kidney stones   ? Hyperlipidemia   ? Migraine with aura   ? Osteopenia   ? prev on fosamax for a few years then started evista at age ~61, T score improved  from -2.29/-1.96 to -1.8/-1.7 as of 06/2011, repeat DXA done 2015  ? ? ?Past Surgical History:  ?Procedure Laterality Date  ? BREAST BIOPSY Left   ? benign  ? CATARACT EXTRACTION W/ INTRAOCULAR LENS IMPLANT Left 11/2016  ? CATARACT EXTRACTION W/ INTRAOCULAR LENS IMPLANT Right 12/22/2016  ? COLONOSCOPY  08/15/2013  ? cyst on back    ? 2 times  ? cyst on eyelid    ? right eye  ? CYSTOCELE REPAIR N/A 02/01/2018  ? Procedure: ANTERIOR REPAIR (CYSTOCELE);  Surgeon: Bjorn Loser, MD;  Location: [facility];  Service: Urology;  Laterality: N/A;  ? CYSTOSCOPY N/A 02/01/2018  ? Procedure: cystoscopy;  Surgeon: Bjorn Loser, MD;  Location: [facility];  Service: Urology;  Laterality: N/A;  ? LAPAROSCOPIC VAGINAL HYSTERECTOMY WITH  SALPINGO OOPHORECTOMY Bilateral 02/01/2018  ? Procedure: LAPAROSCOPIC ASSISTED VAGINAL HYSTERECTOMY WITH BILATERAL SALPINGO OOPHORECTOMY;  Surgeon: Servando Salina, MD;  Location: [facility];  Service: Gynecology;  Laterality: Bilateral;  ? SQUAMOUS CELL CARCINOMA EXCISION    ? on back/removed 2 times/and thigh  ? SQUAMOUS CELL CARCINOMA EXCISION Left 07/15/2016  ? left lower calf  ? SSC removed  1988  ? Birthmark removal partial 1/3  ? TONSILLECTOMY  1950  ? TUBAL LIGATION  1980's  ? ? ?There were no vitals filed for this visit. ? ? Subjective Assessment - 09/24/21 1606   ? ? Subjective Pt doing well today, pain a bit lower below gluteal folds. Walking still going well. Pt started to integrate soem flexion stretches with success.   ? Pertinent History 76 y.o. female pnt presents with bilateral hip pain with radiating symptoms down the back of bilateral legs to her knees that began Jan. 23rd with insidious onset. Pnt descibes pain as "burning" in her hips with best a 0/10 (on pain medication), worst 10/10, and currently 5/10 on NPS. Pnt is limited in her usual recreational activities (yoga, walking, etc) and was previously very active. Her home setup includes 14 stairs with bilateral railing and a shower with  a railing for balance that she installed for her husband following his stroke. She currently lives with her husband. Aggrevating factors include sitting for more than 1 minute, walking and/or standing for more than 30 minutes, bed mobility, and getting into and out of a car. Easing factors include laying flat on her back or in hooklying, reclining in a chair, and taking medication. Pnt is scheduled for a consultation with a neurologist on March 1st and currently has a catheter (due to urinary retention) which she reports is helping her pain. Pnt has had pelvic floor PT in the past and would be interested in that option if it is more appropriate. Pnt denys saddle anaesthesia, n/v, fever, or night pain. Pnt has  noticed she has lost about 8 pounds since last January. Her goal for physical therapy is to decrease her pain.   ? 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 Currently in Pain? Yes   ? Pain Score --   3/10 bilat gluteal folds  ? ?  ?  ? ?  ? ? ?INTERVENTION THIS DATE:  ?-Overground AMB, no device this date; 838ft, no device 0.42m/s ? ?-STS x10, hands free green chair + 2 airex pads ?-standing marching 1x15 bilat, hands on treadmill bar west  ?-STS x10, hands free green chair + 2 airex pads ?-standing marching 1x15 bilat, hands on treadmill bar west (Rt posterior sciatic symptoms more easily provoked, but manageable ? ?-standing hamstrings curls (isometric hip angle) 1x15 bilat @ 2lb  ?-standing bilat heel raises 1x20 (2lb AW)  ? ?On the esteemed airex pad: ?-normal stance vertical head turns x15, horizontal head turns 10x bilat, eyes closed 2x30sec  ? ?-Standing lateral sidestep 1x10 bilat YTB ?-Hooklying Green TB clam, green ball spacer feet 1x15 ?-Hooklying FABER stretch good and gentle 1x30sec ?-Hooklying Green TB clam, green ball spacer feet 1x15 ?-Standing lateral sidestep 1x10 bilat YTB ? ? ? ? ? ? ? PT Education - 09/24/21 1612   ? ? Education Details questions answered about exercise form   ? Person(s) Educated Patient   ? Methods Explanation   ? Comprehension Verbalized understanding   ? ?  ?  ? ?  ? ? ? PT Short Term Goals - 08/12/21 1557   ? ?  ? PT SHORT TERM GOAL #1  ? Title Pnt will be fully independent with HEP in order to progress at home to comeplete ADLs.   ? Baseline 08/12/21 HEP given   ? Time 4   ? Period Weeks   ? Status New   ? Target Date 09/09/21   ? ?  ?  ? ?  ? ? ? ? PT Long Term Goals - 08/12/21 1558   ? ?  ? PT LONG TERM GOAL #1  ? Title Pnt will decrease max NPS value from 10/10 to 8/10  to show clinically significant reduction of pain.   ? Baseline 08/12/21 10/10NPS   ? Time 8   ? Period Weeks   ? Status New   ? Target Date 10/07/21   ?  ? PT LONG TERM GOAL #2  ? Title Pnt will increase 10 meter walk test with RW to 1.26-1.13 m/s in order to meet age norm value to show safe community ambulation.   ? Baseline 08/12/21 .80m/s   ?  Time 8   ? Period Weeks   ? Status New   ? Target Date 10/07/21   ?  ? PT LONG TERM GOAL #3  ? Title Pnt will increase SLS time on left leg to >10sec in order to show clinically significant decrease in community fall risk.   ? Baseline 08/12/21 L: 9.16 seconds   ? Time 8   ? Period Weeks   ? Status New   ? Target Date 10/07/21   ?  ? PT LONG TERM GOAL #4  ? Title Pnt will increase bilateral hip flexion MMT by one muscle grade in order to show significant LE strength gain to complete functional and community ADLs.   ? Baseline 08/12/21 bilateral 3/5   ? Time 8   ? Period Weeks   ? Status New   ? Target Date 10/07/21   ? ?  ?  ? ?  ? ? ? ? ? ? ? ? Plan - 09/24/21 1612   ? ? Clinical Impression Statement Pt doing well this session, jumped aggressively into her now HEP assignments after last session, nothing but good news to report. Pt able to make it through session without any exacerbation of symptoms, however modifications needed to be made for certain activities. Pt showing definite signs of improvement thus far, good general progress toward goals of treatment.   ? Personal Factors and Comorbidities Age;Past/Current Experience;Time since onset of injury/illness/exacerbation   ? Examination-Activity Limitations Bathing;Continence;Stairs;Bed Mobility;Dressing;Stand;Bend;Hygiene/Grooming;Sit;Toileting;Lift;Transfers;Carry;Locomotion Level   ? Examination-Participation Restrictions Church;Driving;Volunteer;Cleaning;Interpersonal Relationship;Meal Prep;Yard Work;Community Activity;Laundry;Shop   ? Stability/Clinical Decision Making Evolving/Moderate complexity   ? Clinical Decision  Making Moderate   ? Rehab Potential Good   ? PT Frequency 2x / week   ? PT Duration 8 weeks   ? PT Treatment/Interventions ADLs/Self Care Home Management;Biofeedback;Cryotherapy;Electrical Stimulation;Mois

## 2021-09-29 ENCOUNTER — Ambulatory Visit: Payer: Medicare HMO

## 2021-09-29 DIAGNOSIS — M5416 Radiculopathy, lumbar region: Secondary | ICD-10-CM | POA: Diagnosis not present

## 2021-09-29 DIAGNOSIS — R269 Unspecified abnormalities of gait and mobility: Secondary | ICD-10-CM | POA: Diagnosis not present

## 2021-09-29 DIAGNOSIS — M6281 Muscle weakness (generalized): Secondary | ICD-10-CM

## 2021-09-29 DIAGNOSIS — R2681 Unsteadiness on feet: Secondary | ICD-10-CM

## 2021-09-29 NOTE — Therapy (Signed)
[place] [facility] ?[address] ?[place], [place], [address] ?Phone: [phone]   Fax:  [phone] ? ?Physical Therapy Treatment/Reassessment ? ?Patient Details  ?Name: Desiree Ortega ?MRN: [id number] ?Date of Birth: May 22, 1946 ?No data recorded ? ?Encounter Date: 09/29/2021 ? ? PT End of Session - 09/29/21 1526   ? ? Visit Number 6   ? Number of Visits 17   ? Date for PT Re-Evaluation 10/07/21   ? Authorization Type Aetna Medicare   ? Authorization Time Period 08/12/21-10/07/21   ? PT Start Time 5638   ? PT Stop Time 7564   ? PT Time Calculation (min) 40 min   ? Equipment Utilized During Treatment Gait belt   ? Activity Tolerance Patient tolerated treatment well;No increased pain   ? Behavior During Therapy [facility] for tasks assessed/performed   ? ?  ?  ? ?  ? ? ?Past Medical History:  ?Diagnosis Date  ? Allergy   ? 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 Evorn Gong  ? Diverticulosis   ? Moderate  ? Female bladder prolapse   ? H/O cold sores   ? History of colon polyps   ? History of kidney stones   ? Hyperlipidemia   ? Migraine with aura   ? Osteopenia   ? prev on fosamax for a few years then started evista at age ~76, T score improved  from -2.29/-1.96 to -1.8/-1.7 as of 06/2011, repeat DXA done 2015  ? ? ?Past Surgical History:  ?Procedure Laterality Date  ? BREAST BIOPSY Left   ? benign  ? CATARACT EXTRACTION W/ INTRAOCULAR LENS IMPLANT Left 11/2016  ? CATARACT EXTRACTION W/ INTRAOCULAR LENS IMPLANT Right 12/22/2016  ? COLONOSCOPY  08/15/2013  ? cyst on back    ? 2 times  ? cyst on eyelid    ? right eye  ? CYSTOCELE REPAIR N/A 02/01/2018  ? Procedure: ANTERIOR REPAIR (CYSTOCELE);  Surgeon: Bjorn Loser, MD;  [facility];  Service: Urology;  Laterality: N/A;  ? CYSTOSCOPY N/A 02/01/2018  ? Procedure: cystoscopy;  Surgeon: Bjorn Loser, MD;  [facility];  Service: Urology;  Laterality: N/A;  ?  LAPAROSCOPIC VAGINAL HYSTERECTOMY WITH SALPINGO OOPHORECTOMY Bilateral 02/01/2018  ? Procedure: LAPAROSCOPIC ASSISTED VAGINAL HYSTERECTOMY WITH BILATERAL SALPINGO OOPHORECTOMY;  Surgeon: Servando Salina, MD;  [facility];  Service: Gynecology;  Laterality: Bilateral;  ? SQUAMOUS CELL CARCINOMA EXCISION    ? on back/removed 2 times/and thigh  ? SQUAMOUS CELL CARCINOMA EXCISION Left 07/15/2016  ? left lower calf  ? SSC removed  1988  ? Birthmark removal partial 1/3  ? TONSILLECTOMY  1950  ? TUBAL LIGATION  1980's  ? ? ?There were no vitals filed for this visit. ? ? Subjective Assessment - 09/29/21 1521   ? ? Subjective Pt still having some pain in her buttocks bilat, especially when sitting, but generally feels to be improving. Pt felt good after last PT session, just tired.   ? Pertinent History 76 y.o. female pnt presents with bilateral hip pain with radiating symptoms down the back of bilateral legs to her knees that began Jan. 23rd with insidious onset. Pnt descibes pain as "burning" in her hips with best a 0/10 (on pain medication), worst 10/10, and currently 5/10 on NPS. Pnt is limited in her usual recreational activities (yoga, walking, etc) and was previously very active. Her home setup includes 14 stairs with bilateral railing and  a shower with a railing for balance that she installed for her husband following his stroke. She currently lives with her husband. Aggrevating factors include sitting for more than 1 minute, walking and/or standing for more than 30 minutes, bed mobility, and getting into and out of a car. Easing factors include laying flat on her back or in hooklying, reclining in a chair, and taking medication. Pnt is scheduled for a consultation with a neurologist on March 1st and currently has a catheter (due to urinary retention) which she reports is helping her pain. Pnt has had pelvic floor PT in the past and would be interested in that option if it is more appropriate. Pnt denys  saddle anaesthesia, n/v, fever, or night pain. Pnt has noticed she has lost about 8 pounds since last January. Her goal for physical therapy is to decrease her pain.   ? How long can you sit comfortably? Last friday sat through a dinner at [employer] (1 minute at evaluation)   ? How long can you stand comfortably? 30 minutes (30 min at eval)   ? How long can you walk comfortably? No longer limitedl 30 min   ? 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 Patient Stated Goals relieve her pain   ? Pain Score 2    bilater buttocks  ? ?  ?  ? ?  ? ?Reassessment:  ?FOTO 09/29/21: 50 (42 at eval)  ?10MWT 09/29/21: 1.72m/s ?SLS 09/29/21: 30+sec bilat  ? ?INTERVENTION  ?-aa/rom on nustep 4 minutes seat 10, arms 12  ? ?-overground AMB 556ft, no device, no LOB  ? ?-seated FABER stretch, low effort 2x30sec bilat ?-STS from chair 1x10 (chair + airex) (advanced this date)  ?-standing marching 1x10 bilat 2lb AW (advanced this date)  ? ?-STS from chair 1x10 (chair + airex) (advanced this date)  ?-standing marching 1x10 bilat 2lb AW (advanced this date)  ? ?-standing hamstrings curls 1x15 @ 2lb bilat  ?-standing heel raises 1x20 in unison 2lb AW ?-standing hip ABDCT 1x10 bilat @ 2lb bilat  ?-standing hip extension 1x10 bilat @ 2lb bilat  ? ?-standing hamstrings curls 1x15 @ 3lb bilat  ?-standing heel raises 1x20 in unison 3lb AW ?-standing hip ABDCT 1x10 bilat @ 2lb bilat  ?-standing hip extension 1x10 bilat @ 2lb bilat  ? ?---------------------------------------------------------- ?INTERVENTION 4/5:  ?-Overground AMB, no device this date; 845ft, no device 0.62m/s ?  ?-STS x10, hands free green chair + 2 airex pads ?-standing marching 1x15 bilat, hands on treadmill bar west  ?-STS x10, hands free green chair + 2 airex  pads ?-standing marching 1x15 bilat, hands on treadmill bar west (Rt posterior sciatic symptoms more easily provoked, but manageable ?  ?-standing hamstrings curls (isometric hip angle) 1x15 bilat @ 2lb  ?-standing bilat heel raises 1x20 (2lb AW)  ?  ?On the esteemed airex pad: ?-normal stance vertical head turns x15, horizontal head turns 10x bilat, eyes closed 2x30sec  ?  ?-Standing lateral sidestep 1x10 bilat YTB ?-Hooklying Green TB clam, green ball spacer feet 1x15 ?-Hooklying FABER stretch good and gentle 1x30sec ?-Hooklying Green TB clam, green ball spacer feet 1x15 ?-Standing lateral sidestep 1x10 bilat YTB ? ? ?Intervention 09/22/21 ?-Seated AA/ROM Nustep: Level 2 x5 minutes, Seat 9, arms 12 ?-AMB 442ft overground c 4WW: good tall posture,  1:58, no pain exacerbation, no frank unsteadiness ?-STS from chair + 2 airex pads 2x10 ?-forward flexion stretch on silver ball 2x60sec, 1x60sec on rollator ?-Seated FABER stretch without aggressive force 2x60sec bilat (more painful on Rt but perceived as beneficial)  ?-seated marching 2x30  ?  ?HEP review and handout update  ? ? ? ? ? ? PT Education - 09/29/21 1526   ? ? Education Details Modification of stretches for home.   ? Person(s) Educated Patient   ? Methods Explanation   ? Comprehension Verbalized understanding   ? ?  ?  ? ?  ? ? ? PT Short Term Goals - 08/12/21 1557   ? ?  ? PT SHORT TERM GOAL #1  ? Title Pnt will be fully independent with HEP in order to progress at home to comeplete ADLs.   ? Baseline 08/12/21 HEP given   ? Time 4   ? Period Weeks   ? Status New   ? Target Date 09/09/21   ? ?  ?  ? ?  ? ? ? ? PT Long Term Goals - 09/29/21 1550   ? ?  ? PT LONG TERM GOAL #1  ? Title Pnt will decrease max NPS value from 10/10 to 8/10 to show clinically significant reduction of pain.   ? Baseline 08/12/21 10/10NPS; 09/29/21: 7/10   ? Time 8   ? Period Weeks   ? Status Achieved   ? Target Date 10/07/21   ?  ? PT LONG TERM GOAL #2  ? Title Pnt will increase 10 meter  walk test with RW to 1.26-1.13 m/s in order to meet age norm value to show safe community ambulation.   ? Baseline 08/12/21 0.60m/s; 09/29/21: 1.65m/s   ? Time 8   ? Period Weeks   ? Status Achieved   ? Target Date 10/07/21   ?  ? PT L

## 2021-09-30 ENCOUNTER — Ambulatory Visit (HOSPITAL_COMMUNITY)
Admission: RE | Admit: 2021-09-30 | Discharge: 2021-09-30 | Disposition: A | Discharge status: Home / Self Care | Payer: Medicare HMO | Source: Ambulatory Visit | Attending: Neurosurgery | Admitting: Neurosurgery

## 2021-09-30 ENCOUNTER — Encounter (HOSPITAL_COMMUNITY): Payer: Self-pay

## 2021-09-30 DIAGNOSIS — I700 Atherosclerosis of aorta: Secondary | ICD-10-CM | POA: Diagnosis not present

## 2021-09-30 DIAGNOSIS — R918 Other nonspecific abnormal finding of lung field: Secondary | ICD-10-CM | POA: Insufficient documentation

## 2021-09-30 DIAGNOSIS — J439 Emphysema, unspecified: Secondary | ICD-10-CM | POA: Diagnosis not present

## 2021-09-30 IMAGING — CT CT CHEST W/ CM
2 of 4 series · 15 of 36 positions shown, 18 images · IV contrast (APPLIED)
Comparison: [DATE], [DATE].

CLINICAL DATA: Lung mass seen on MRI.

EXAM:
CT CHEST WITH CONTRAST
TECHNIQUE: Multidetector CT imaging of the chest was performed during
intravenous contrast administration.

[Series 3: chest w · axial · 0.70mm/px · z∈[+990,+1282]mm · 12 of 174 slices shown, 15 images]
[im 14/174  mediastinal]
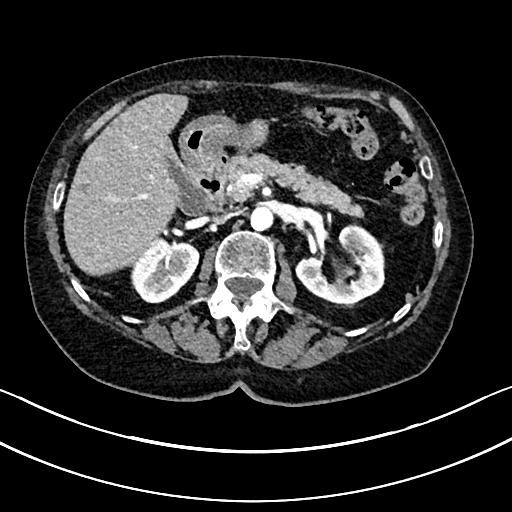
[im 14/174  lung]
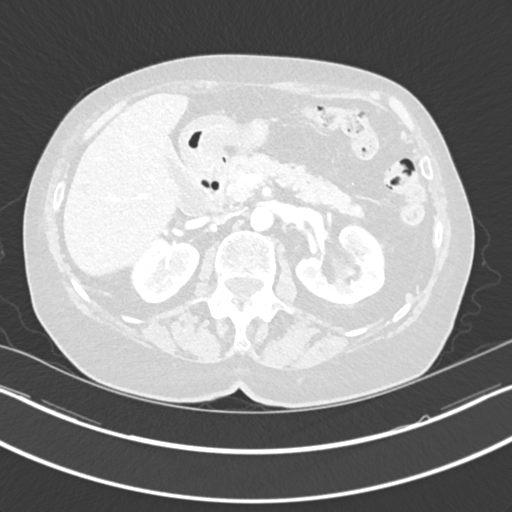
[im 27/174  lung]
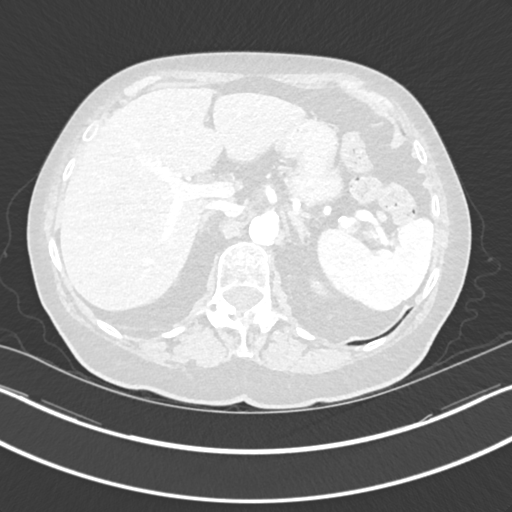
[im 40/174  lung]
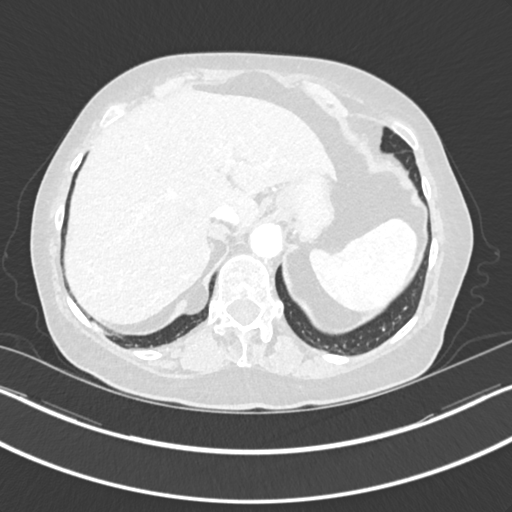
[im 54/174  lung]
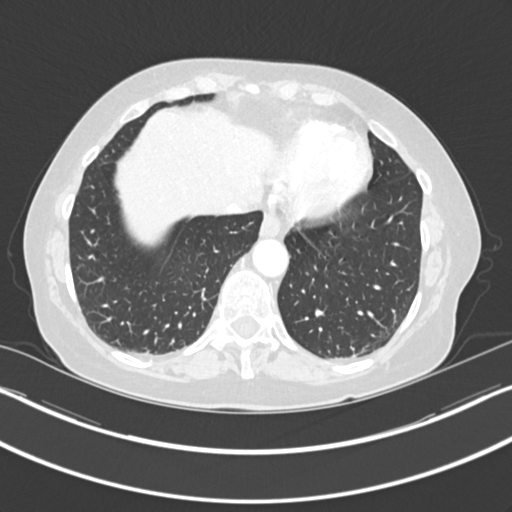
[im 67/174  mediastinal]
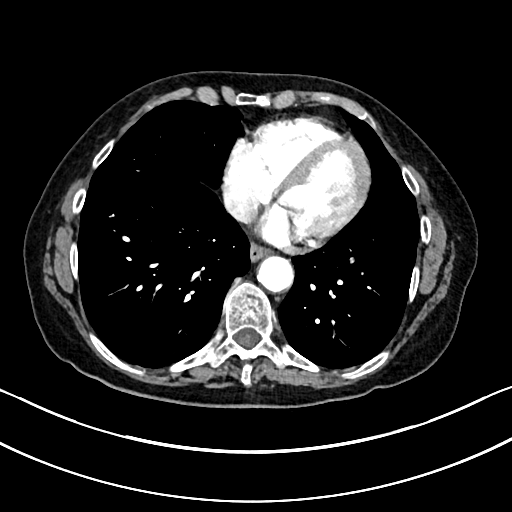
[im 67/174  lung]
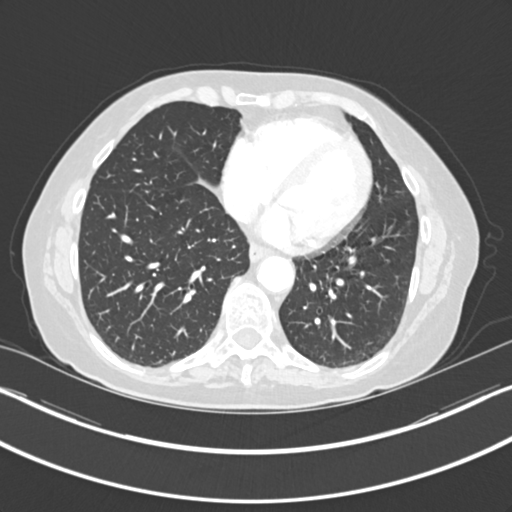
[im 80/174  lung]
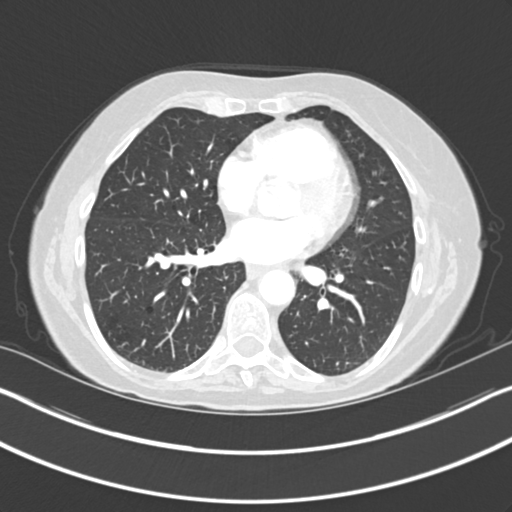
[im 94/174  lung]
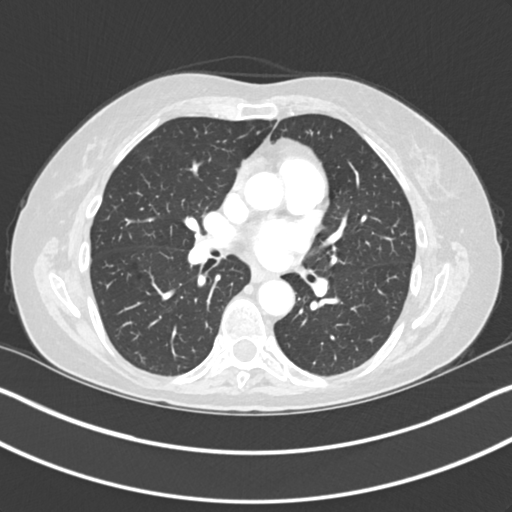
[im 107/174  lung]
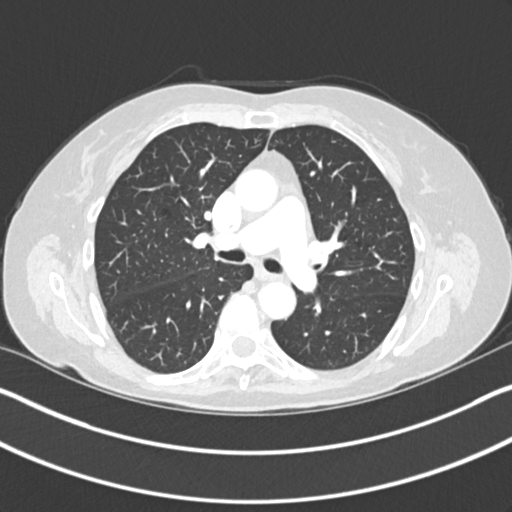
[im 120/174  mediastinal]
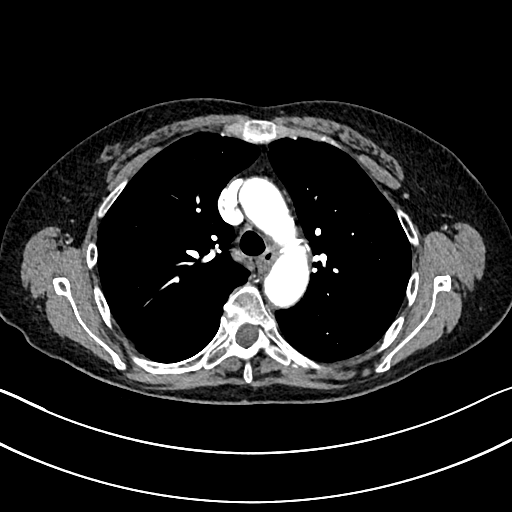
[im 120/174  lung]
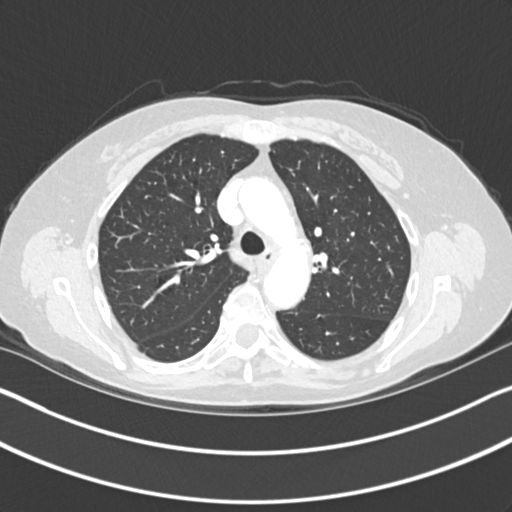
[im 134/174  lung]
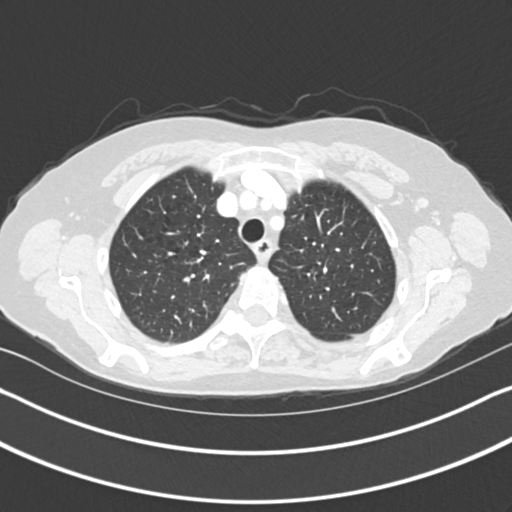
[im 147/174  lung]
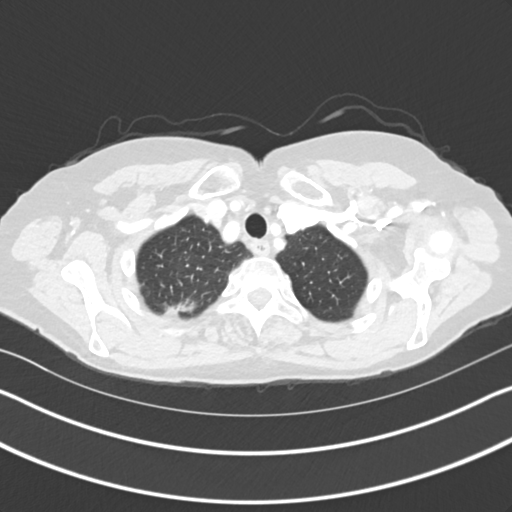
[im 160/174  lung]
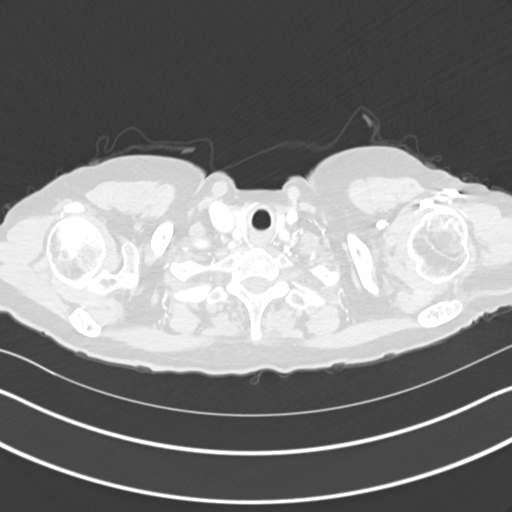

[Series 6: cor · coronal · 0.65mm/px · 3 of 128 slices shown]
[im 26/128  lung]
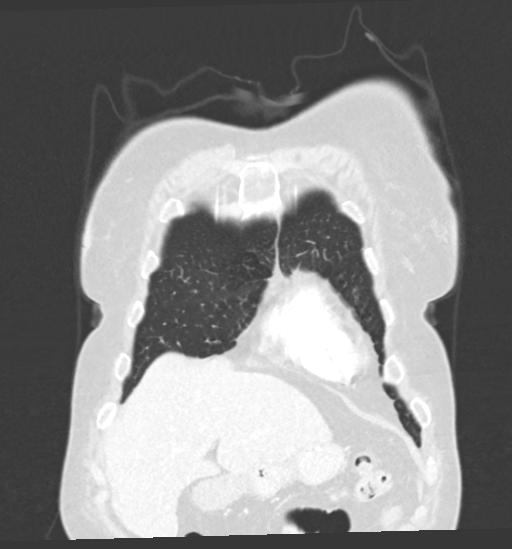
[im 51/128  lung]
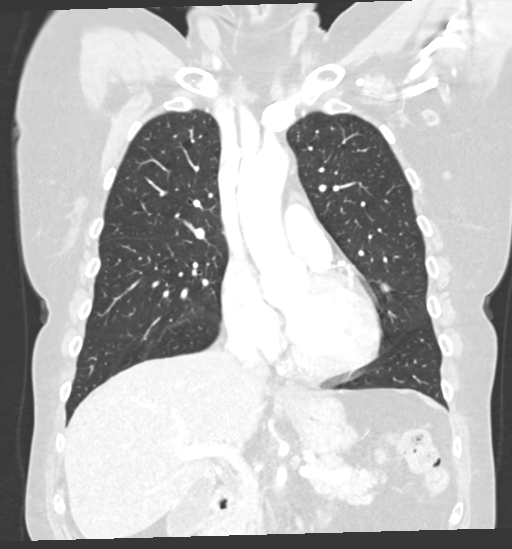
[im 77/128  lung]
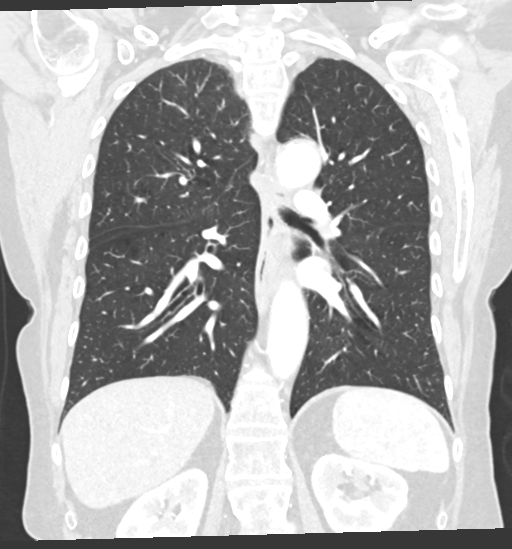

[15 of 36 positions shown; findings below may reference images not displayed]

RADIATION DOSE REDUCTION: This exam was performed according to the
departmental dose-optimization program which includes automated
exposure control, adjustment of the mA and/or kV according to
patient size and/or use of iterative reconstruction technique.

CONTRAST:  100mL OMNIPAQUE IOHEXOL 300 MG/ML  SOLN
FINDINGS: Cardiovascular: The heart is normal in size and there is no
pericardial effusion. Coronary artery calcifications are noted.
There is atherosclerotic calcification of the aorta without evidence
of aneurysm. The pulmonary trunk is normal in caliber.

Mediastinum/Nodes: No enlarged mediastinal, hilar, or axillary lymph
nodes. Thyroid gland, trachea, and esophagus demonstrate no
significant findings.

Lungs/Pleura: There is a spiculated opacity in the right upper lobe
measuring 2.5 x 1.7 cm, axial image 32. A subpleural nodule is
present in the right upper lobe measuring 3 mm, axial image 36.
Nodular opacities are noted in the right lower lobe measuring 4 mm,
axial image 125 and axial image 126. A 4 mm subpleural nodule is
present in the left lower lobe, axial image 102. Emphysematous
changes are present in the lungs. No effusion or pneumothorax.

Upper Abdomen: A 1.0 cm hypodensity is present in the left lobe of
the liver, likely cyst. There is a 9 mm hypodensity in the left
kidney, likely cyst. No acute abnormality.

Musculoskeletal: Mild degenerative changes in the thoracic spine. No
acute osseous abnormality.
IMPRESSION: 1. Spiculated opacity in the right upper lobe measuring 2.5 x
cm. Consider one of the following in 3 months for both low-risk and
high-risk individuals: (a) repeat chest CT, (b) follow-up PET-CT, or
(c) tissue sampling. This recommendation follows the consensus
statement: Guidelines for Management of Incidental Pulmonary Nodules
Detected on CT Images: From the [HOSPITAL] [JD]; Radiology
[JD]; [DATE].
2. Scattered pulmonary nodules bilaterally measuring up to 4 mm. No
follow-up needed if patient is low-risk (and has no known or
suspected primary neoplasm). Non-contrast chest CT can be considered
in 12 months if patient is high-risk. This recommendation follows
the consensus statement: Guidelines for Management of Incidental
Pulmonary Nodules Detected on CT Images: From the [HOSPITAL]
3. Emphysema.
4. Aortic atherosclerosis.
5. Coronary artery calcifications.

## 2021-09-30 MED ORDER — IOHEXOL 300 MG/ML  SOLN
100.0000 mL | Freq: Once | INTRAMUSCULAR | Status: AC | PRN
  Administered 2021-09-30: 100 mL via INTRAVENOUS

## 2021-10-01 ENCOUNTER — Ambulatory Visit: Payer: Medicare HMO | Admitting: Physical Therapy

## 2021-10-02 ENCOUNTER — Other Ambulatory Visit: Payer: Self-pay | Admitting: Family Medicine

## 2021-10-02 MED ORDER — HYDROCODONE-ACETAMINOPHEN 5-325 MG PO TABS: 1.0000 | ORAL_TABLET | Freq: Four times a day (QID) | ORAL | 0 refills | Status: DC | PRN

## 2021-10-05 NOTE — Telephone Encounter (Signed)
I wasn't able to see the CT prior.  I see the report now.  Several issues.  ? ?1. Spiculated opacity in the right upper lobe measuring 2.5 x 1.7 cm. Consider one of the following in 3 months for both low-risk and high-risk individuals: (a) repeat chest CT, (b) follow-up PET-CT, or ?(c) tissue sampling.  The first 2 options are imaging, the third option is referral to pulmonary for consideration of a biopsy.  I think talking to pulmonary about a potential biopsy makes sense.  Let me know if she wants me to go ahead with the referral.   ? ?2. Scattered pulmonary nodules bilaterally measuring up to 4 mm.  Reasonable to repeat chest CT in 12 months since patient is a former smoker.   I put a note in the EMR about this ? ?3. Emphysema.  This is noted but she is not a smoker now. ? ?4. Aortic atherosclerosis and coronary artery calcifications.  If the patient able to tolerate a statin?  She can take that instead of red yeast rice if she did not have aches on the medication.  That could help stabilize the plaques and decrease risk of heart attack or stroke.  Please let me know if she is willing to give that a try.  Thanks. ?

## 2021-10-05 NOTE — Addendum Note (Signed)
Addended by: Tonia Ghent on: 10/05/2021 08:52 PM ? ? Modules accepted: Orders ? ?

## 2021-10-06 ENCOUNTER — Encounter: Payer: Self-pay | Admitting: Physical Therapy

## 2021-10-06 ENCOUNTER — Ambulatory Visit: Payer: Medicare HMO | Admitting: Physical Therapy

## 2021-10-06 DIAGNOSIS — R269 Unspecified abnormalities of gait and mobility: Secondary | ICD-10-CM

## 2021-10-06 DIAGNOSIS — R2681 Unsteadiness on feet: Secondary | ICD-10-CM

## 2021-10-06 DIAGNOSIS — M5416 Radiculopathy, lumbar region: Secondary | ICD-10-CM | POA: Diagnosis not present

## 2021-10-06 DIAGNOSIS — M6281 Muscle weakness (generalized): Secondary | ICD-10-CM

## 2021-10-06 NOTE — Therapy (Signed)
?[facility] ?[address] ?[place], [place], [address] ?Phone: [phone]   Fax:  [phone] ? ?Physical Therapy Treatment ? ?Patient Details  ?Name: Desiree Ortega ?MRN: [id number] ?Date of Birth: 1945/08/18 ?No data recorded ? ?Encounter Date: 10/06/2021 ? ? PT End of Session - 10/06/21 1707   ? ? Visit Number 7   ? Number of Visits 17   ? Date for PT Re-Evaluation 10/07/21   ? Authorization Type [employer] Medicare   ? Authorization Time Period 08/12/21-10/07/21   ? PT Start Time 1600   ? PT Stop Time 7425   ? PT Time Calculation (min) 45 min   ? Equipment Utilized During Treatment Gait belt   ? Activity Tolerance Patient tolerated treatment well;No increased pain   ? Behavior During Therapy [facility] for tasks assessed/performed   ? ?  ?  ? ?  ? ? ?Past Medical History:  ?Diagnosis Date  ? Allergy   ? 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 Evorn Gong  ? Diverticulosis   ? Moderate  ? Female bladder prolapse   ? H/O cold sores   ? History of colon polyps   ? History of kidney stones   ? Hyperlipidemia   ? Migraine with aura   ? Osteopenia   ? prev on fosamax for a few years then started evista at age ~14, T score improved  from -2.29/-1.96 to -1.8/-1.7 as of 06/2011, repeat DXA done 2015  ? ? ?Past Surgical History:  ?Procedure Laterality Date  ? BREAST BIOPSY Left   ? benign  ? CATARACT EXTRACTION W/ INTRAOCULAR LENS IMPLANT Left 11/2016  ? CATARACT EXTRACTION W/ INTRAOCULAR LENS IMPLANT Right 12/22/2016  ? COLONOSCOPY  08/15/2013  ? cyst on back    ? 2 times  ? cyst on eyelid    ? right eye  ? CYSTOCELE REPAIR N/A 02/01/2018  ? Procedure: ANTERIOR REPAIR (CYSTOCELE);  Surgeon: Bjorn Loser, MD;  Location: [facility];  Service: Urology;  Laterality: N/A;  ? CYSTOSCOPY N/A 02/01/2018  ? Procedure: cystoscopy;  Surgeon: Bjorn Loser, MD;  Location: [facility];  Service: Urology;  Laterality: N/A;  ? LAPAROSCOPIC VAGINAL  HYSTERECTOMY WITH SALPINGO OOPHORECTOMY Bilateral 02/01/2018  ? Procedure: LAPAROSCOPIC ASSISTED VAGINAL HYSTERECTOMY WITH BILATERAL SALPINGO OOPHORECTOMY;  Surgeon: Servando Salina, MD;  Location: [facility];  Service: Gynecology;  Laterality: Bilateral;  ? SQUAMOUS CELL CARCINOMA EXCISION    ? on back/removed 2 times/and thigh  ? SQUAMOUS CELL CARCINOMA EXCISION Left 07/15/2016  ? left lower calf  ? SSC removed  1988  ? Birthmark removal partial 1/3  ? TONSILLECTOMY  1950  ? TUBAL LIGATION  1980's  ? ? ?There were no vitals filed for this visit. ? ? Subjective Assessment - 10/06/21 1604   ? ? Subjective Pt states she is doing well today. She is able to perform household duties with occasional rest breaks. She does take pain meds to control pain in hip. 1/10 pain in R hip upon arrival. She drove herself to PT for the first time today.   ? Pertinent History 76 y.o. female pnt presents with bilateral hip pain with radiating symptoms down the back of bilateral legs to her knees that began Jan. 23rd with insidious onset. Pnt descibes pain as "burning" in her hips with best a 0/10 (on pain medication), worst 10/10, and currently 5/10 on NPS. Pnt is limited in her usual recreational activities (  yoga, walking, etc) and was previously very active. Her home setup includes 14 stairs with bilateral railing and a shower with a railing for balance that she installed for her husband following his stroke. She currently lives with her husband. Aggrevating factors include sitting for more than 1 minute, walking and/or standing for more than 30 minutes, bed mobility, and getting into and out of a car. Easing factors include laying flat on her back or in hooklying, reclining in a chair, and taking medication. Pnt is scheduled for a consultation with a neurologist on March 1st and currently has a catheter (due to urinary retention) which she reports is helping her pain. Pnt has had pelvic floor PT in the past and would be interested in  that option if it is more appropriate. Pnt denys saddle anaesthesia, n/v, fever, or night pain. Pnt has noticed she has lost about 8 pounds since last January. Her goal for physical therapy is to decrease her pain.   ? How long can you sit comfortably? Last friday sat through a dinner at [employer] (1 minute at evaluation)   ? How long can you stand comfortably? 30 minutes (30 min at eval)   ? How long can you walk comfortably? No longer limitedl 30 min   ? 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 Patient Stated Goals relieve her pain   ? Currently in Pain? Yes   ? Pain Score 1    ? ?  ?  ? ?  ? ? ? ? ? ? ?INTERVENTION  ? ?-Nustep for muscular warm-up, 5 minutes seat 10, arms 12, level 4. ?-seated FABER stretch, 2x45sec bilat ?-STS from chair 3x10, no UE support. Last set with airex pad under feet.  ? ?Standing with 3# AW: ?-march, alternating 2x60 seconds  ?-hamstring curl, alternating 2x60 seconds  ?-heel raises 2x10 w/ 30 second last rep hold no UE support ?  ?-lateral stepping with BlueTB, 3x10 reps BLE ?-bridge with hip abduction Black RB, 3x15  ?-side lying clamshells, Black RB, 2x15 ? ? ? ? ?Access Code: [email or web address] ? ? ?Exercises ?- Sit to Stand with Arms Crossed  - 1 x daily - 3 x weekly - 3 sets - 10 reps ?- Standing March  - 1 x daily - 3 x weekly - 2 sets - 60 seconds  time  ?- Standing Alternating Knee Flexion  - 1 x daily - 3 x weekly - 2 sets - 60 seconds  time  ?- Standing Heel Raise  - 1 x daily - 3 x weekly - 3 sets - 30 seconds  hold ?- Side Stepping with Resistance at Ankles  - 1 x daily - 3 x weekly - 3 sets - 10-12 reps ?- Bridge with Hip Abduction and Resistance  - 1 x daily - 3 x weekly - 3 sets - 15 reps ?- Clamshell with Resistance  - 1 x daily - 3 x weekly - 2 sets - 15  reps ? ? ? ? ?Clinical Impression: Pt is pleasant and motivated in session. POC was progressed in resistance, reps and sets. HEP was progressed, printed and provided to patient - pt demonstrates and verbalizes understanding. R hip presents with increased weakness compared to left. Minor balance deficits noted during side steps and heel raise hold. Will address  next session. Pt will benefit from continued PT to strengthen and improve ROM of hip and general BLE for improved QOL and return to daily activities.  ? ? ? ? ? ? ? ? ? PT Short Term Goals - 08/12/21 1557   ? ?  ? PT SHORT TERM GOAL #1  ? Title Pnt will be fully independent with HEP in order to progress at home to comeplete ADLs.   ? Baseline 08/12/21 HEP given   ? Time 4   ? Period Weeks   ? Status New   ? Target Date 09/09/21   ? ?  ?  ? ?  ? ? ? ? PT Long Term Goals - 09/29/21 1550   ? ?  ? PT LONG TERM GOAL #1  ? Title Pnt will decrease max NPS value from 10/10 to 8/10 to show clinically significant reduction of pain.   ? Baseline 08/12/21 10/10NPS; 09/29/21: 7/10   ? Time 8   ? Period Weeks   ? Status Achieved   ? Target Date 10/07/21   ?  ? PT LONG TERM GOAL #2  ? Title Pnt will increase 10 meter walk test with RW to 1.26-1.13 m/s in order to meet age norm value to show safe community ambulation.   ? Baseline 08/12/21 0.31m/s; 09/29/21: 1.64m/s   ? Time 8   ? Period Weeks   ? Status Achieved   ? Target Date 10/07/21   ?  ? PT LONG TERM GOAL #3  ? Title Pnt will increase SLS time on left leg to >10sec in order to show clinically significant decrease in community fall risk.   ? Baseline 08/12/21 L: 9.16 seconds; 09/29/21: >30sec bilat   ? Time 8   ? Period Weeks   ? Status Achieved   ? Target Date 10/07/21   ?  ? PT LONG TERM GOAL #4  ? Title Pt will increase bilateral hip flexion MMT by one muscle grade in order to show significant LE strength gain to complete functional and community ADLs.   ? Baseline 08/12/21 bilateral 3/5; pending   ? Time 8   ? Period Weeks    ? Status On-going   ? Target Date 10/07/21   ? ?  ?  ? ?  ? ? ? ? ? ? ? ? Plan - 10/06/21 1708   ? ? Clinical Impression Statement Pt is pleasant and motivated in session. POC was progressed in resistance, reps and s

## 2021-10-06 NOTE — Telephone Encounter (Signed)
Spoke with patient about CT results. Patient is okay to do the referral and also is fine with going back on a statin.  ?

## 2021-10-07 ENCOUNTER — Telehealth: Payer: Self-pay | Admitting: Family Medicine

## 2021-10-07 DIAGNOSIS — I2510 Atherosclerotic heart disease of native coronary artery without angina pectoris: Secondary | ICD-10-CM | POA: Insufficient documentation

## 2021-10-07 MED ORDER — ATORVASTATIN CALCIUM 10 MG PO TABS: 10.0000 mg | ORAL_TABLET | Freq: Every day | ORAL | 3 refills | Status: DC

## 2021-10-07 NOTE — Telephone Encounter (Addendum)
Pulmonary referral signed.  She should get a call about that.  Prescription sent for Lipitor 10 mg.  Reasonable to recheck lipids in about 2 months.  Would stop red yeast rice with Lipitor use.  I put in the orders for that.  Needs a fasting lab visit.  If she has increased aches on the medication then stop and let me know.  Thanks. ?

## 2021-10-07 NOTE — Addendum Note (Signed)
Addended by: Tonia Ghent on: 10/07/2021 08:03 AM ? ? Modules accepted: Orders ? ?

## 2021-10-07 NOTE — Telephone Encounter (Signed)
Pt called stating that she would like a call back to discuss her Biopsy. Pt states that you have such a great bed side manner and that she really needs to talk to you. Pt states that she is scared and will do whatever you need her to do. Please advise.  ?

## 2021-10-07 NOTE — Telephone Encounter (Signed)
Patient has been notified about rx and referral. Lab appt made for 12/04/21 at 8:00 am ?

## 2021-10-08 ENCOUNTER — Ambulatory Visit: Payer: Medicare HMO | Admitting: Physical Therapy

## 2021-10-08 NOTE — Telephone Encounter (Addendum)
Late entry.  Called patient on 10/07/2021 after clinic.  Discussed recent CT and rationale for follow-up with pulmonary.  We talked about potential differential diagnosis which which could include a lung cancer.  We talked about the options for follow-up imaging versus pulmonary evaluation in the meantime.  I think it makes sense to go ahead with pulmonary evaluation to see if it is prudent for biopsy at this point versus continued observation.  I appreciate Dr. Lamonte Sakai seeing the patient in the near future.   ? ?Her back pain is getting better in the meantime.  I presume that is a separate issue but I am glad to know that she is improving.  Discussed. ? ?We agreed to await pulmonary consult and then go from there.  She can update me as needed in the meantime.  I appreciate her taking the call and she said she felt better/satisfied with the plan after talking to me. ?

## 2021-10-14 ENCOUNTER — Other Ambulatory Visit: Payer: Self-pay | Admitting: Neurosurgery

## 2021-10-14 ENCOUNTER — Other Ambulatory Visit (HOSPITAL_COMMUNITY): Payer: Self-pay | Admitting: Neurosurgery

## 2021-10-14 DIAGNOSIS — M4714 Other spondylosis with myelopathy, thoracic region: Secondary | ICD-10-CM

## 2021-10-16 ENCOUNTER — Ambulatory Visit: Payer: Medicare HMO | Admitting: Physical Therapy

## 2021-10-16 ENCOUNTER — Encounter: Payer: Self-pay | Admitting: Physical Therapy

## 2021-10-16 DIAGNOSIS — R269 Unspecified abnormalities of gait and mobility: Secondary | ICD-10-CM | POA: Diagnosis not present

## 2021-10-16 DIAGNOSIS — R2681 Unsteadiness on feet: Secondary | ICD-10-CM

## 2021-10-16 DIAGNOSIS — M5416 Radiculopathy, lumbar region: Secondary | ICD-10-CM | POA: Diagnosis not present

## 2021-10-16 DIAGNOSIS — M6281 Muscle weakness (generalized): Secondary | ICD-10-CM

## 2021-10-16 NOTE — Therapy (Signed)
[facility] ?[address] ?[place], [place], [address] ?Phone: [phone]   Fax:  [phone] ? ?Physical Therapy Treatment/Physical Therapy Discharge Summary ? ? ?Dates of reporting period  08/12/21   to   10/16/21 ? ? ?Patient Details  ?Name: Desiree Ortega ?MRN: [id number] ?Date of Birth: 02-11-1946 ?No data recorded ? ?Encounter Date: 10/16/2021 ? ? PT End of Session - 10/16/21 1523   ? ? Visit Number 8   ? Number of Visits 17   ? Date for PT Re-Evaluation 10/07/21   ? Authorization Type [employer]etna Medicare   ? Authorization Time Period 08/12/21-10/07/21   ? PT Start Time 0626   ? PT Stop Time 9485   ? PT Time Calculation (min) 47 min   ? Equipment Utilized During Treatment Gait belt   ? Activity Tolerance Patient tolerated treatment well;No increased pain   ? Behavior During Therapy [facility] for tasks assessed/performed   ? ?  ?  ? ?  ? ? ?Past Medical History:  ?Diagnosis Date  ? Allergy   ? 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 Evorn Gong  ? Diverticulosis   ? Moderate  ? Female bladder prolapse   ? H/O cold sores   ? History of colon polyps   ? History of kidney stones   ? Hyperlipidemia   ? Migraine with aura   ? Osteopenia   ? prev on fosamax for a few years then started evista at age ~42, T score improved  from -2.29/-1.96 to -1.8/-1.7 as of 06/2011, repeat DXA done 2015  ? ? ?Past Surgical History:  ?Procedure Laterality Date  ? BREAST BIOPSY Left   ? benign  ? CATARACT EXTRACTION W/ INTRAOCULAR LENS IMPLANT Left 11/2016  ? CATARACT EXTRACTION W/ INTRAOCULAR LENS IMPLANT Right 12/22/2016  ? COLONOSCOPY  08/15/2013  ? cyst on back    ? 2 times  ? cyst on eyelid    ? right eye  ? CYSTOCELE REPAIR N/A 02/01/2018  ? Procedure: ANTERIOR REPAIR (CYSTOCELE);  Surgeon: Bjorn Loser, MD;  [facility];  Service: Urology;  Laterality: N/A;  ? CYSTOSCOPY N/A 02/01/2018  ? Procedure: cystoscopy;  Surgeon: Bjorn Loser, MD;  Location: [facility];  Service: Urology;  Laterality: N/A;  ? LAPAROSCOPIC VAGINAL HYSTERECTOMY WITH SALPINGO OOPHORECTOMY Bilateral 02/01/2018  ? Procedure: LAPAROSCOPIC ASSISTED VAGINAL HYSTERECTOMY WITH BILATERAL SALPINGO OOPHORECTOMY;  Surgeon: Servando Salina, MD;  Location: [facility];  Service: Gynecology;  Laterality: Bilateral;  ? SQUAMOUS CELL CARCINOMA EXCISION    ? on back/removed 2 times/and thigh  ? SQUAMOUS CELL CARCINOMA EXCISION Left 07/15/2016  ? left lower calf  ? SSC removed  1988  ? Birthmark removal partial 1/3  ? TONSILLECTOMY  1950  ? TUBAL LIGATION  1980's  ? ? ?There were no vitals filed for this visit. ? ? Subjective Assessment - 10/16/21 1521   ? ? Subjective Pt states she is doing well today. She took a pain pill (first today) before coming to therapy today. Reports 3/10 pain in left hamstring; some tightness in right but not enough to give a number. She states pain is getting better.   ? Pertinent History 76 y.o. female pnt presents with bilateral hip pain with radiating symptoms down the back of bilateral legs to her knees that began Jan. 23rd with insidious onset. Pnt descibes pain as "burning" in her hips with best a 0/10 (on pain medication),  worst 10/10, and currently 5/10 on NPS. Pnt is limited in her usual recreational activities (yoga, walking, etc) and was previously very active. Her home setup includes 14 stairs with bilateral railing and a shower with a railing for balance that she installed for her husband following his stroke. She currently lives with her husband. Aggrevating factors include sitting for more than 1 minute, walking and/or standing for more than 30 minutes, bed mobility, and getting into and out of a car. Easing factors include laying flat on her back or in hooklying, reclining in a chair, and taking medication. Pnt is scheduled for a consultation with a neurologist on March 1st and currently has a catheter (due to urinary retention) which she reports  is helping her pain. Pnt has had pelvic floor PT in the past and would be interested in that option if it is more appropriate. Pnt denys saddle anaesthesia, n/v, fever, or night pain. Pnt has noticed she has lost about 8 pounds since last January. Her goal for physical therapy is to decrease her pain.   ? How long can you sit comfortably? Last friday sat through a dinner at [employer] (1 minute at evaluation)   ? How long can you stand comfortably? 30 minutes (30 min at eval)   ? How long can you walk comfortably? No longer limitedl 30 min   ? 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 Patient Stated Goals relieve her pain   ? Currently in Pain? Yes   ? Pain Score 3    ? Pain Location Leg   ? Pain Orientation Upper;Left;Posterior   ? ?  ?  ? ?  ? ? ?DISCHARGE - OUTCOME MEASURES ?-hip flexion MMT: 4-/5 ?-FOTO: 77 ? ? ? ?INTERVENTION  ?  ?-Nustep for muscular warm-up, 5 minutes seat 8, arms 10, level 5. ? ?-supine straight leg raise 3# AW, 3x10 ?-bridge w/ alternating march 3# AW, 3x10 ?-core: hips and knees at 90-90 with alternating heel taps to mat, 3x15 each side  ? ?-seated FABER stretch, 1x60sec bilat ?-supine hamstring with strap, 2x45 seconds each side  ? ?- 3-way hip with YellowRB at ankles, x15 each direction, each side (flexion, abduction, extension) ? ?-hip flexion drive E33 seconds each side  ?  ?  ?  ?Access Code: [email or web address] ? Added the following exercises: ? ?- Supine Active Straight Leg Raise  - 1 x daily - 3 x weekly - 3 sets - 10 reps ?- Marching Bridge  - 1 x daily - 3 x weekly - 3 sets - 10-15 reps ?- Supine 90/90 Alternating Toe Touch  - 1 x daily - 3 x weekly - 3 sets - 10-15 reps ?- Hooklying Hamstring Stretch with Strap  - 1 x daily - 7 x weekly - 2-3 reps - 30-60 seconds  hold ?- Full Plank  with Hip Flexion/Adduction Knee Drive at [employer]  - 1 x daily - 3 x weekly - 2 sets - 60 seconds  time  ?- Standing Hip Flexion with Resistance Loop  - 1 x daily - 3 x weekly - 3 sets - 10-15 reps ?- Hip Extension with Resistance Loop  - 1 x daily - 3 x weekly - 3 sets - 10-15 reps ?- Hip Abduction with Resistance Loop  - 1 x daily -  3 x weekly - 3 sets - 10-15 reps ? ? ?  ?Clinical Impression: Pt is pleasant and motivated in session. PT and pt agree that pt has made significant progress and is ready to d/c. Her primary remaining deficit is hip flexion strength. PT assigned multiple hip flexor exercises in session today and added them to HEP. Pt is compliant with HEP and in general very active with multiple forms of exercise. Pt described tightness in hamstrings; PT assigned HS stretch with pt stating it felt great. Pt has no further PT needs. Will d/c at this time.  ?  ? ? ? ? ? ? ? ? PT Short Term Goals - 10/16/21 1621   ? ?  ? PT SHORT TERM GOAL #1  ? Title Pnt will be fully independent with HEP in order to progress at home to comeplete ADLs.   ? Baseline 08/12/21 HEP given   ? Time 4   ? Period Weeks   ? Status Achieved   ? Target Date 09/09/21   ? ?  ?  ? ?  ? ? ? ? PT Long Term Goals - 10/16/21 1619   ? ?  ? PT LONG TERM GOAL #1  ? Title Pnt will decrease max NPS value from 10/10 to 8/10 to show clinically significant reduction of pain.   ? Baseline 08/12/21 10/10NPS; 09/29/21: 7/10   ? Time 8   ? Period Weeks   ? Status Achieved   ? Target Date 10/07/21   ?  ? PT LONG TERM GOAL #2  ? Title Pnt will increase 10 meter walk test with RW to 1.26-1.13 m/s in order to meet age norm value to show safe community ambulation.   ? Baseline 08/12/21 0.58m/s; 09/29/21: 1.41m/s   ? Time 8   ? Period Weeks   ? Status Achieved   ? Target Date 10/07/21   ?  ? PT LONG TERM GOAL #3  ? Title Pnt will increase SLS time on left leg to >10sec in order to show clinically significant decrease in community fall risk.   ? Baseline 08/12/21 L:  9.16 seconds; 09/29/21: >30sec bilat   ? Time 8   ? Period Weeks   ? Status Achieved   ? Target Date 10/07/21   ?  ? PT LONG TERM GOAL #4  ? Title Pt will increase bilateral hip flexion MMT by one muscle g

## 2021-10-21 ENCOUNTER — Encounter: Payer: Medicare HMO | Admitting: Physical Therapy

## 2021-10-22 ENCOUNTER — Encounter: Payer: Self-pay | Admitting: Emergency Medicine

## 2021-10-22 ENCOUNTER — Ambulatory Visit: Payer: Medicare HMO | Admitting: Emergency Medicine

## 2021-10-22 ENCOUNTER — Other Ambulatory Visit: Payer: Self-pay | Admitting: Family Medicine

## 2021-10-22 DIAGNOSIS — R911 Solitary pulmonary nodule: Secondary | ICD-10-CM | POA: Diagnosis not present

## 2021-10-22 MED ORDER — HYDROCODONE-ACETAMINOPHEN 5-325 MG PO TABS: 1.0000 | ORAL_TABLET | Freq: Four times a day (QID) | ORAL | 0 refills | Status: DC | PRN

## 2021-10-22 NOTE — Progress Notes (Signed)
? ?Subjective:  ? ? Patient ID: Desiree Ortega, female    DOB: 1946-06-09, 76 y.o.   MRN: [id number] ? ?HPI ?76 year old former smoker (15 pack years) with a history of hyperlipidemia, migraines, renal calculi, squamous/basal cell skin cancer (resected).  She is referred today for abnormal CT scan of the chest. ?She reports that she began to have what sounds like neuropathic radicular pain in January. She does not have any respiratory symptoms - no cough, no dyspnea, no wheeze or CP.  ? ?CT chest/11/23 reviewed by me shows no enlarged mediastinal or hilar adenopathy, there is a spiculated 2.5 cm right upper lobe nodule, smaller 3-4 mm nodules in the right upper lobe, right lower lobe, left lower lobe.  Also noted emphysematous changes ? ? ?Review of Systems ?As per HPI ? ?Past Medical History:  ?Diagnosis Date  ? Allergy   ? Cancer ([place]) on back,leg,tailbone  ? 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 Evorn Gong  ? Diverticulosis   ? Moderate  ? Female bladder prolapse   ? H/O cold sores   ? History of colon polyps   ? History of kidney stones   ? Hyperlipidemia   ? Migraine with aura   ? Osteopenia   ? prev on fosamax for a few years then started evista at age ~39, T score improved  from -2.29/-1.96 to -1.8/-1.7 as of 06/2011, repeat DXA done 2015  ?  ? ?Family History  ?Problem Relation Age of Onset  ? Osteopenia Mother   ? Hypertension Father   ? Heart disease Father   ?     AFIB, PVD stents LE's, pacer  ? Stroke Father   ? Cancer Brother   ?     prostate, prostatectomy, radiation 5 years later, Hormone Tx  ? Prostate cancer Brother   ? Heart disease Brother   ? Cancer Brother   ?     prostate, prostatectomy  ? Prostate cancer Brother   ? Diabetes Other   ? Cancer Other   ?     Non-Hodgkin's Lymphoma  ? Depression Daughter   ? Alcohol abuse Neg Hx   ? Drug abuse Neg Hx   ? Colon cancer Neg Hx   ? Breast cancer Neg Hx   ? Bladder Cancer Neg Hx   ? Kidney cancer Neg Hx   ?  ? ?Social History  ? ?Socioeconomic  History  ? Marital status: Married  ?  Spouse name: Not on file  ? Number of children: 4  ? Years of education: Not on file  ? Highest education level: Not on file  ?Occupational History  ? Occupation: [employer], [employer]  ?  Comment: Teaches swimming  ?Tobacco Use  ? Smoking status: Former  ?  Packs/day: 0.50  ?  Years: 36.00  ?  Pack years: 18.00  ?  Types: Cigarettes  ?  Quit date: 06/22/2001  ?  Years since quitting: 20.3  ? Smokeless tobacco: Never  ?Vaping Use  ? Vaping Use: Never used  ?Substance and Sexual Activity  ? Alcohol use: Not Currently  ?  Alcohol/week: 2.0 standard drinks  ?  Types: 2 Glasses of wine per week  ?  Comment: occasionally  ? Drug use: No  ? Sexual activity: Yes  ?  Birth control/protection: Post-menopausal  ?Other Topics Concern  ? Not on file  ?Social History Narrative  ? Married 1963, lives with husband  ? [employer] and [employer] fan  ? Mother in  ALF as of 2022 (pt's mother was born in [place])  ? 4 daughters  ? Taught swimming prev  ? ?Social Determinants of Health  ? ?Financial Resource Strain: Low Risk   ? Difficulty of Paying Living Expenses: Not hard at all  ?Food Insecurity: No Food Insecurity  ? Worried About Charity fundraiser in the Last Year: Never true  ? Ran Out of Food in the Last Year: Never true  ?Transportation Needs: No Transportation Needs  ? Lack of Transportation (Medical): No  ? Lack of Transportation (Non-Medical): No  ?Physical Activity: Inactive  ? Days of Exercise per Week: 0 days  ? Minutes of Exercise per Session: 0 min  ?Stress: No Stress Concern Present  ? Feeling of Stress : Not at all  ?Social Connections: Moderately Integrated  ? Frequency of Communication with Friends and Family: More than three times a week  ? Frequency of Social Gatherings with Friends and Family: More than three times a week  ? Attends Religious Services: More than 4 times per year  ? Active Member of Clubs or Organizations: No  ? Attends Archivist Meetings: Never  ?  Marital Status: Married  ?Intimate Partner Violence: Not At Risk  ? Fear of Current or Ex-Partner: No  ? Emotionally Abused: No  ? Physically Abused: No  ? Sexually Abused: No  ?  ? ?Allergies  ?Allergen Reactions  ? Alendronate Sodium Other (See Comments)  ?  heartburn  ? Codeine Nausea Only  ? Influenza Vaccines   ?  Local reaction to vaccine in 2014  ? Keflex [Cephalexin] Rash  ?  All around her torso area  ?  ? ?Outpatient Medications Prior to Visit  ?Medication Sig Dispense Refill  ? acetaminophen (TYLENOL) 500 MG tablet Take 1,000 mg by mouth 2 (two) times daily as needed for mild pain or headache.    ? Ascorbic Acid (VITAMIN C) 1000 MG tablet Take 1 tablet (1,000 mg total) by mouth daily.    ? atorvastatin (LIPITOR) 10 MG tablet Take 1 tablet (10 mg total) by mouth daily. 90 tablet 3  ? b complex vitamins tablet Take 1 tablet by mouth daily.    ? Cholecalciferol (VITAMIN D PO) Take 1,000 Units by mouth daily.    ? CINNAMON PO Take 2,000 mg by mouth daily.    ? Cyanocobalamin (B-12 PO) Take 1,000 mcg by mouth daily.    ? Flaxseed, Linseed, (FLAX SEEDS PO) Take 1 capsule by mouth daily.    ? HYDROcodone-acetaminophen (NORCO) 5-325 MG tablet Take 1 tablet by mouth every 6 (six) hours as needed for severe pain. 30 tablet 0  ? Magnesium 250 MG TABS Take 250 mg by mouth daily.    ? Multiple Vitamin (MULTIVITAMIN) capsule Take 1 capsule by mouth daily.    ? Omega-3 Fatty Acids (FISH OIL PO) Take 1,000 mg by mouth in the morning and at bedtime.    ? Turmeric 500 MG CAPS Take 500 mg by mouth daily.    ? Zinc 50 MG CAPS Take 50 mg by mouth daily.    ? fexofenadine (ALLEGRA) 180 MG tablet Take 1 tablet (180 mg total) by mouth as needed. Seasonal allergies from Spring to Fall (Patient not taking: Reported on 10/22/2021)    ? Melatonin 3 MG TABS Take 1 tablet (3 mg total) by mouth at bedtime as needed. (Patient not taking: Reported on 10/22/2021)    ? Ubiquinol 100 MG CAPS Take 100 mg by mouth daily. (  Patient not taking:  Reported on 10/22/2021)    ? ?No facility-administered medications prior to visit.  ? ? ? ? ? ?   ?Objective:  ? Physical Exam ? ?Vitals:  ? 10/22/21 0929  ?BP: 136/74  ?Pulse: 73  ?Temp: 97.9 ?F (36.6 ?C)  ?TempSrc: Oral  ?SpO2: 96%  ?Weight: 142 lb 12.8 oz (64.8 kg)  ?Height: 5' 6.5" (1.689 m)  ? ?Gen: Pleasant, well-nourished, in no distress,  normal affect ? ?ENT: No lesions,  mouth clear,  oropharynx clear, no postnasal drip ? ?Neck: No JVD, no stridor ? ?Lungs: No use of accessory muscles, no crackles or wheezing on normal respiration, no wheeze on forced expiration ? ?Cardiovascular: RRR, heart sounds normal, no murmur or gallops, no peripheral edema ? ?Musculoskeletal: No deformities, no cyanosis or clubbing ? ?Neuro: alert, awake, non focal ? ?Skin: Warm, no lesions or rash ? ?   ?Assessment & Plan:  ?Pulmonary nodule 1 cm or greater in diameter ?Spiculated right upper lobe pulmonary nodule in a patient with a history of tobacco use, moderate to high suspicion for primary lung cancer.  No evidence of mediastinal adenopathy.  Based on her good functional capacity suspect that she would be a good surgical candidate if we decide to pursue that route.  We will start with a PET scan to look for local adenopathy, better characterize the nodule.  Based on this result and her pulmonary function testing we will decide whether to refer her for primary resection versus plan for navigational bronchoscopy and tissue diagnosis. ? ?We will arrange for a PET scan to further evaluate your right upper lobe pulmonary nodule. ?We will arrange for pulmonary function testing ?Follow with Dr. Lamonte Sakai next available after your PET scan so we can discuss and plan next steps in your evaluation. ? ? ?Baltazar Apo, MD, PhD ?10/22/2021, 10:13 AM ?[facility] ?[phone] or if no answer before 7:00PM call [phone] ?For any issues after 7:00PM please call eLink [phone] ? ? ?

## 2021-10-22 NOTE — Assessment & Plan Note (Signed)
Spiculated right upper lobe pulmonary nodule in a patient with a history of tobacco use, moderate to high suspicion for primary lung cancer.  No evidence of mediastinal adenopathy.  Based on her good functional capacity suspect that she would be a good surgical candidate if we decide to pursue that route.  We will start with a PET scan to look for local adenopathy, better characterize the nodule.  Based on this result and her pulmonary function testing we will decide whether to refer her for primary resection versus plan for navigational bronchoscopy and tissue diagnosis. ? ?We will arrange for a PET scan to further evaluate your right upper lobe pulmonary nodule. ?We will arrange for pulmonary function testing ?Follow with Dr. Lamonte Sakai next available after your PET scan so we can discuss and plan next steps in your evaluation. ?

## 2021-10-22 NOTE — Patient Instructions (Signed)
We will arrange for a PET scan to further evaluate your right upper lobe pulmonary nodule. ?We will arrange for pulmonary function testing ?Follow with Dr. Lamonte Sakai next available after your PET scan so we can discuss and plan next steps in your evaluation. ?

## 2021-10-22 NOTE — Addendum Note (Signed)
Addended by: Gavin Potters R on: 10/22/2021 10:24 AM ? ? Modules accepted: Orders ? ?

## 2021-10-23 ENCOUNTER — Encounter: Payer: Self-pay | Admitting: Emergency Medicine

## 2021-10-23 ENCOUNTER — Encounter: Payer: Medicare HMO | Admitting: Physical Therapy

## 2021-10-23 NOTE — Telephone Encounter (Signed)
Received the following message from patient:  ? ?"Thank you for seeing me. I am Desiree Ortega birthdate [birth year].  I am awaiting to schedule a pet scan and I thought you should know that my neurologist has me scheduled  for an MRI and an MRA on may 26th  just thought you should know that if it makes a difference if anything were doing." ? ?Reviewed her chart, she is scheduled for a PET on 10/31/21. She is also scheduled for a MR thoracic spine w/wo contrast and MR angio spinal canal w/contrast on 11/14/21.  ? ?Will route to Dr. Lamonte Sakai as a FYI.  ?

## 2021-10-23 NOTE — Telephone Encounter (Signed)
Thank you very much for letting me know ?

## 2021-10-28 ENCOUNTER — Encounter: Payer: Medicare HMO | Admitting: Physical Therapy

## 2021-10-31 ENCOUNTER — Ambulatory Visit (HOSPITAL_COMMUNITY)
Admission: RE | Admit: 2021-10-31 | Discharge: 2021-10-31 | Disposition: A | Discharge status: Home / Self Care | Payer: Medicare HMO | Source: Ambulatory Visit | Attending: Emergency Medicine | Admitting: Emergency Medicine

## 2021-10-31 DIAGNOSIS — R911 Solitary pulmonary nodule: Secondary | ICD-10-CM | POA: Diagnosis not present

## 2021-10-31 LAB — GLUCOSE, CAPILLARY: Glucose-Capillary: 105 mg/dL — ABNORMAL HIGH (ref 70–99)

## 2021-10-31 IMAGING — PT NM PET TUM IMG INITIAL (PI) SKULL BASE T - THIGH
7 series · 25 of 25 positions shown · non-contrast
Comparison: [DATE]

CLINICAL DATA: Initial treatment strategy for right lung nodule.

EXAM:
NUCLEAR MEDICINE PET SKULL BASE TO THIGH
TECHNIQUE: 6.9 mCi F-18 FDG was injected intravenously. Full-ring PET imaging
was performed from the skull base to thigh after the radiotracer. CT
data was obtained and used for attenuation correction and anatomic
localization.
Fasting blood glucose: 105 mg/dl

[Series 3: pet sk_thigh ac · axial · 5.0mm · 4.07mm/px · z∈[-877,-17]mm · 6 of 216 slices shown]
[im 1/216]
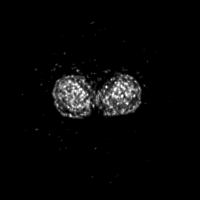
[im 44/216]
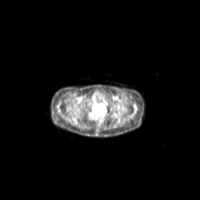
[im 87/216]
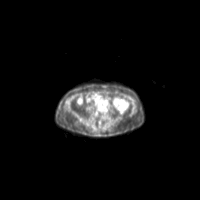
[im 130/216]
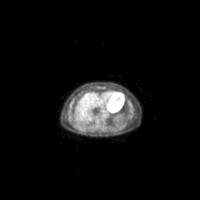
[im 173/216]
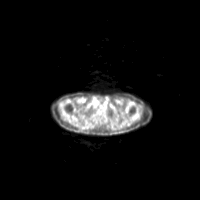
[im 216/216]
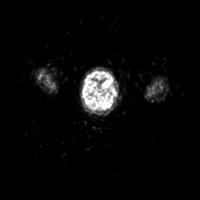

[Series 4: ct sk_thigh 5.0 br38 · axial · 5.0mm · 0.98mm/px · z∈[-877,-17]mm · 5 of 216 slices shown]
[im 1/216]
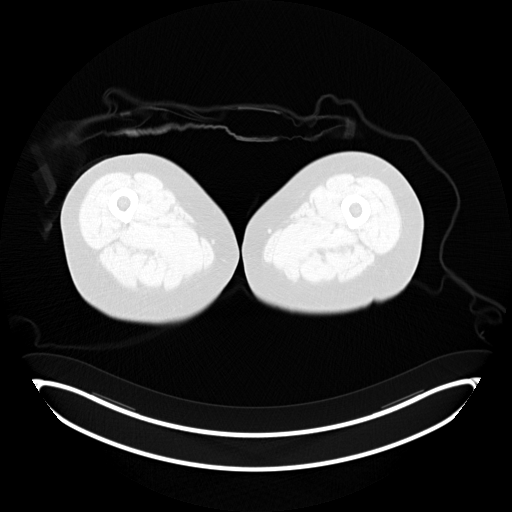
[im 54/216]
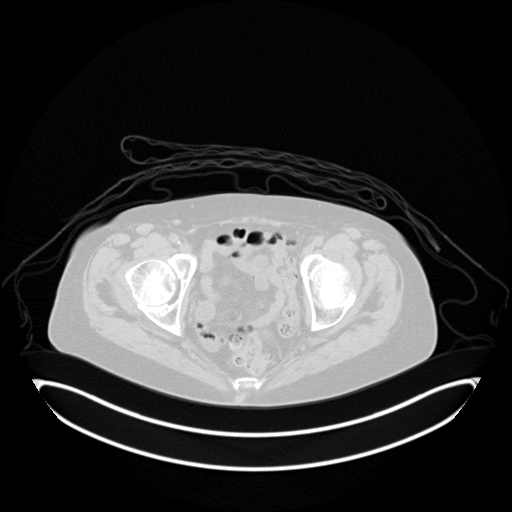
[im 108/216]
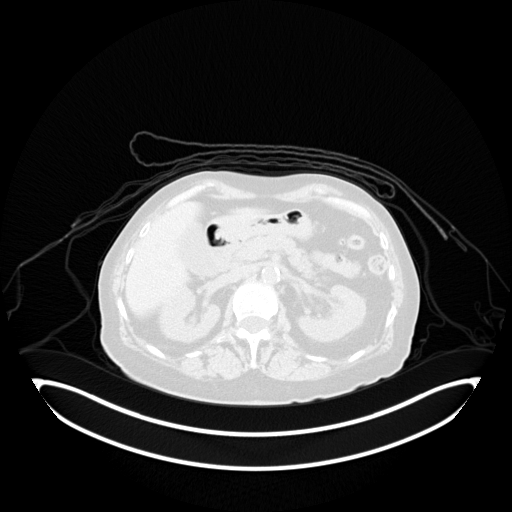
[im 162/216]
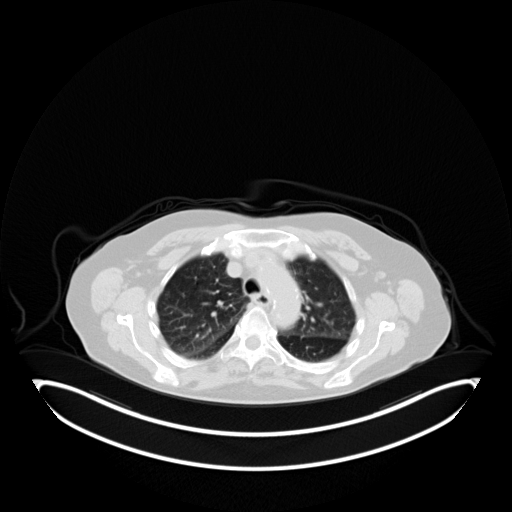
[im 216/216  brain]
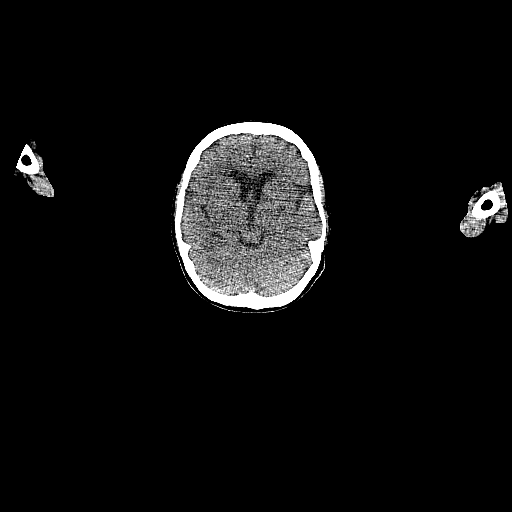

[Series 5: pet sk_thigh nac · axial · 5.0mm · 4.07mm/px · z∈[-877,-17]mm · 5 of 216 slices shown]
[im 1/216]
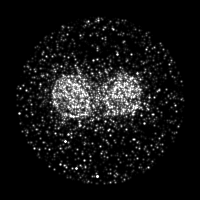
[im 54/216]
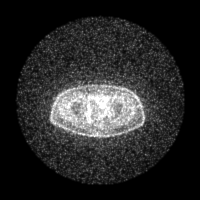
[im 108/216]
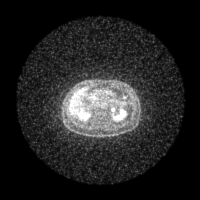
[im 162/216]
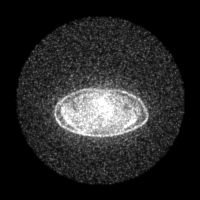
[im 216/216]
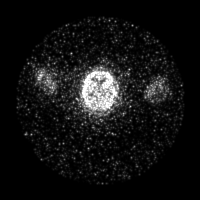

[Series 7: ct 5.0 bl57 lung_bone · axial · 5.0mm · 0.58mm/px · z∈[-407,-167]mm · 2 of 61 slices shown]
[im 1/61]
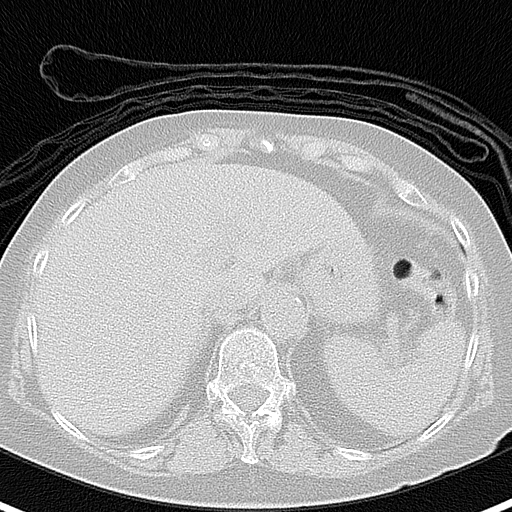
[im 61/61]
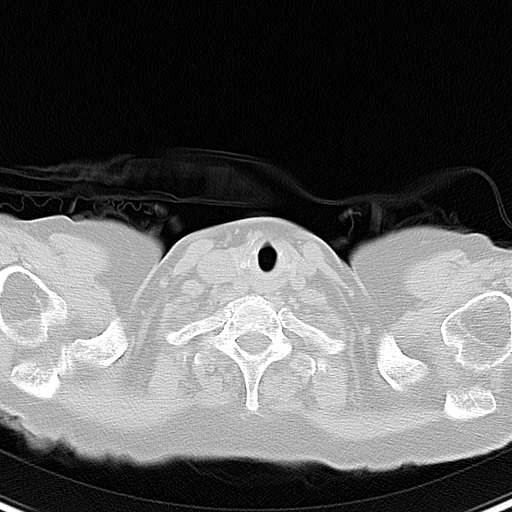

[Series 604: fused tra · 5 of 208 slices shown]
[im 1/208]
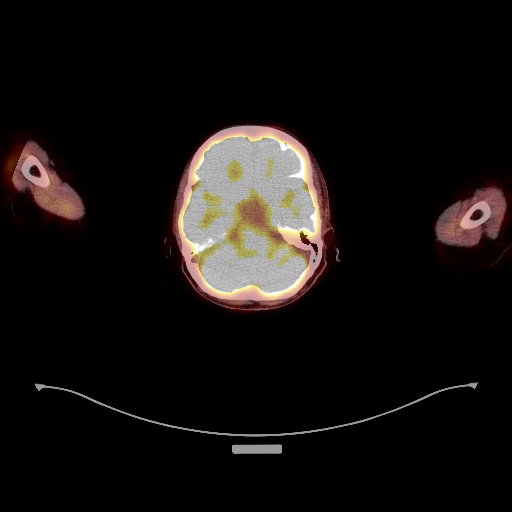
[im 52/208]
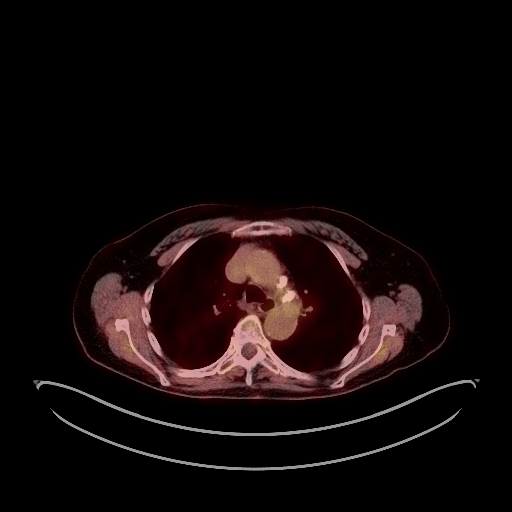
[im 104/208]
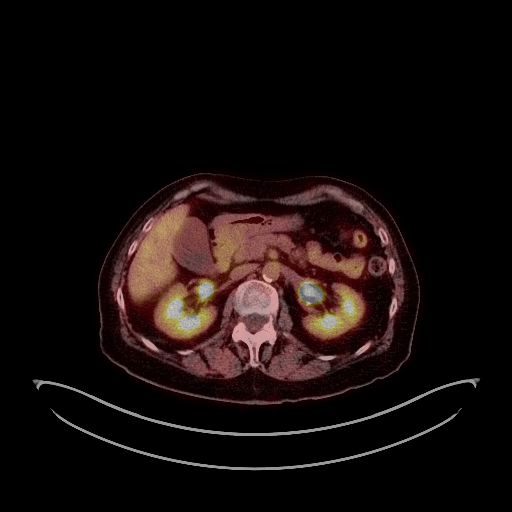
[im 156/208]
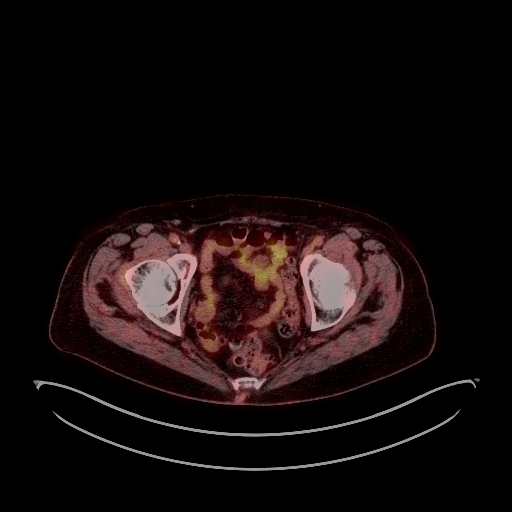
[im 208/208]
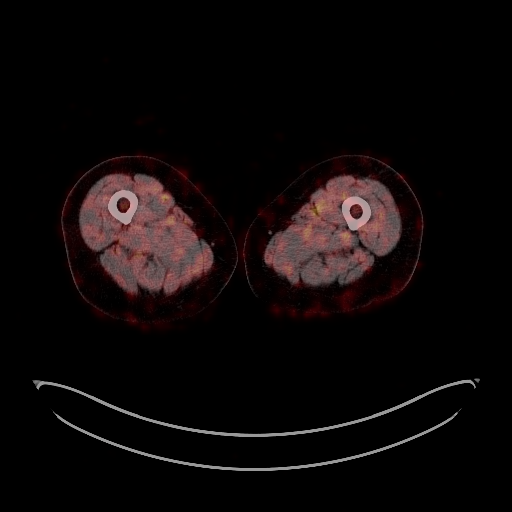

[Series 605: fused cor · 1 of 47 slices shown]
[im 1/47]
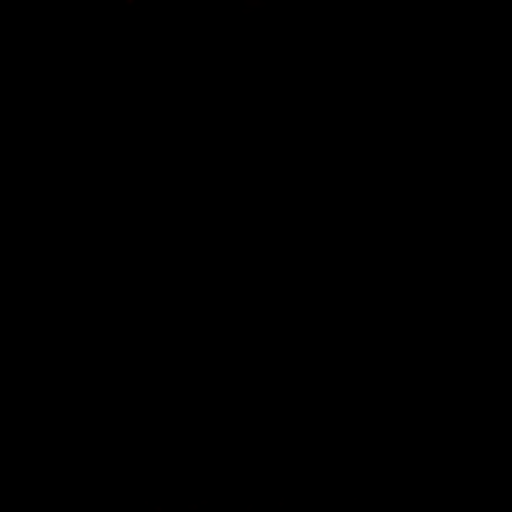

[Series 606: mip pet · coronal · 1.79mm/px · 1 of 32 slices shown]
[im 1/32]
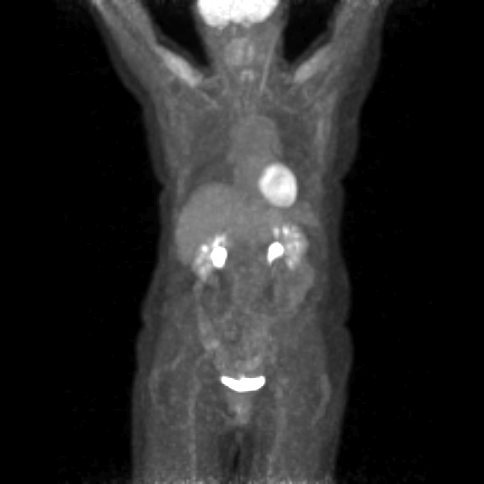

[25 of 25 positions shown; findings below may reference images not displayed]

FINDINGS: Mediastinal blood-pool activity (background): SUV max =

Liver activity (reference): SUV max = N/A

NECK:  No hypermetabolic lymph nodes or masses.

Incidental CT findings:  None.

CHEST: No hypermetabolic lymph nodes. Pulmonary nodule with
irregular margins is seen in the posterior right lung apex,
measuring 2.4 x 1.7 cm on image [DATE]. This shows mild FDG uptake,
with SUV max of 1.9. No other suspicious pulmonary nodules
identified.

Incidental CT findings: Aortic and coronary atherosclerotic
calcification incidentally noted.

ABDOMEN/PELVIS: No abnormal hypermetabolic activity within the
liver, pancreas, adrenal glands, or spleen. No hypermetabolic lymph
nodes in the abdomen or pelvis.

Incidental CT findings: Small low-attenuation lesion in the left
hepatic lobe shows no hypermetabolic activity, suggesting a benign
etiology. Diffuse colonic diverticulosis, without evidence of
diverticulitis. Aortic atherosclerotic calcification incidentally
noted. Prior hysterectomy noted. Adnexal regions are unremarkable in
appearance.

SKELETON: No focal hypermetabolic bone lesions to suggest skeletal
metastasis.

Incidental CT findings:  None.
IMPRESSION: 2.4 cm right upper lobe pulmonary nodule shows mild FDG uptake, but
is not hypermetabolic. Low-grade adenocarcinoma cannot be excluded.

No evidence of metastatic disease.

Aortic Atherosclerosis ([HG]-[HG]).

## 2021-10-31 MED ORDER — FLUDEOXYGLUCOSE F - 18 (FDG) INJECTION
7.1000 | Freq: Once | INTRAVENOUS | Status: AC
  Administered 2021-10-31: 7.1 via INTRAVENOUS

## 2021-11-01 ENCOUNTER — Encounter: Payer: Self-pay | Admitting: Family Medicine

## 2021-11-03 ENCOUNTER — Encounter: Payer: Medicare HMO | Admitting: Physical Therapy

## 2021-11-06 ENCOUNTER — Encounter: Payer: Medicare HMO | Admitting: Physical Therapy

## 2021-11-14 ENCOUNTER — Ambulatory Visit (HOSPITAL_COMMUNITY)
Admission: RE | Admit: 2021-11-14 | Discharge: 2021-11-14 | Disposition: A | Discharge status: Home / Self Care | Payer: Medicare HMO | Source: Ambulatory Visit | Attending: Neurosurgery | Admitting: Neurosurgery

## 2021-11-14 DIAGNOSIS — M4714 Other spondylosis with myelopathy, thoracic region: Secondary | ICD-10-CM | POA: Insufficient documentation

## 2021-11-14 DIAGNOSIS — M47814 Spondylosis without myelopathy or radiculopathy, thoracic region: Secondary | ICD-10-CM | POA: Diagnosis not present

## 2021-11-14 IMAGING — MR MR THORACIC SPINE WO/W CM
19 of 48 series · 19 of 48 positions shown · IV contrast (gadavist)
Comparison: [DATE]

CLINICAL DATA: Thoracic myelopathy

EXAM:
MRI THORACIC WITHOUT AND WITH CONTRAST
TECHNIQUE: Multiplanar and multiecho pulse sequences of the thoracic spine were
obtained without and with intravenous contrast.
CONTRAST:  6mL GADAVIST GADOBUTROL 1 MMOL/ML IV SOLN

[Series 2: T1 · sagittal · 3.0mm · 0.90mm/px · 1 of 14 slices shown (1 of 3)]
[im 1/14]
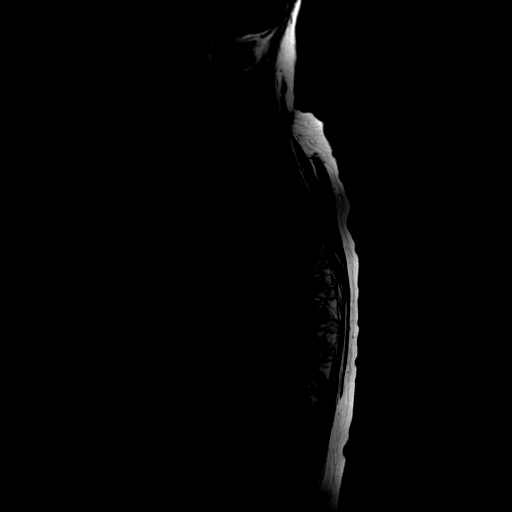

[Series 3: T2 · sagittal · 3.0mm · 0.66mm/px · 1 of 15 slices shown (1 of 2)]
[im 1/15]
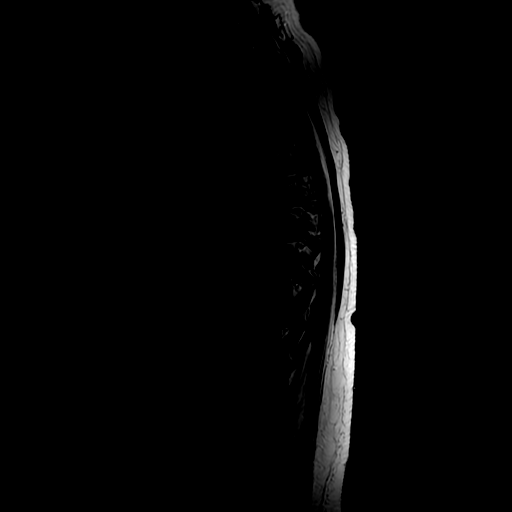

[Series 4: STIR · sagittal · 3.0mm · 0.66mm/px · 1 of 15 slices shown]
[im 1/15]
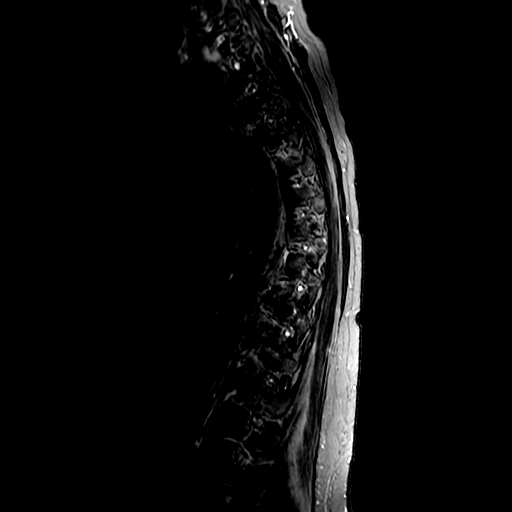

[Series 5: T1 · sagittal · 3.0mm · 0.66mm/px · 1 of 15 slices shown (2 of 3)]
[im 1/15]
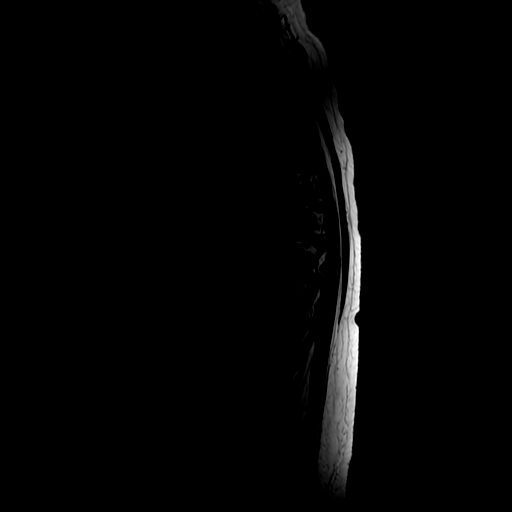

[Series 6: T2 · axial · 4.0mm · 0.39mm/px · 1 of 46 slices shown (2 of 2)]
[im 1/46]
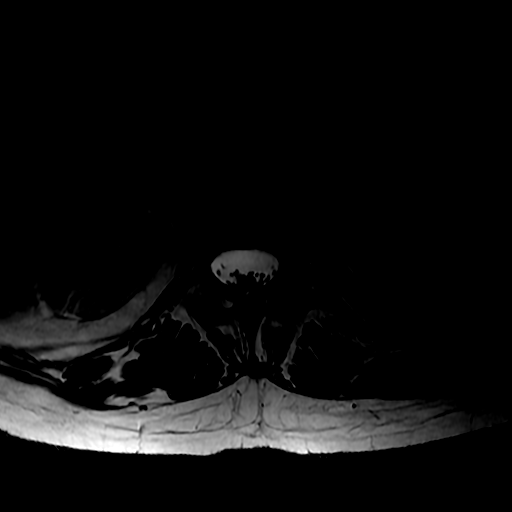

[Series 7: ax 2d merge · axial · 4.0mm · 0.39mm/px · 1 of 46 slices shown (1 of 2)]
[im 1/46]
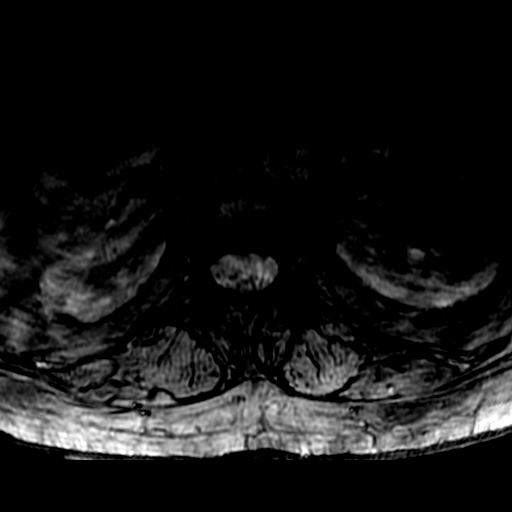

[Series 8: T1 · axial · non-contrast · 4.0mm · 0.39mm/px · 1 of 46 slices shown (3 of 3)]
[im 1/46]
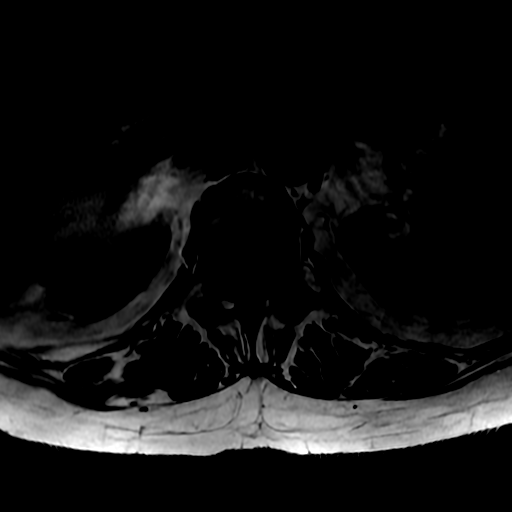

[Series 9: ax 2d merge · axial · 4.0mm · 0.39mm/px · 1 of 46 slices shown (2 of 2)]
[im 1/46]
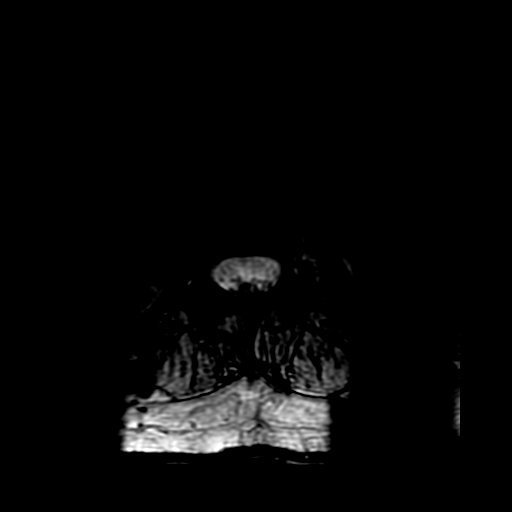

[Series 11: tricks temp. res. · sagittal · 2.4mm · 0.86mm/px · 1 of 80 slices shown]
[im 1/80]
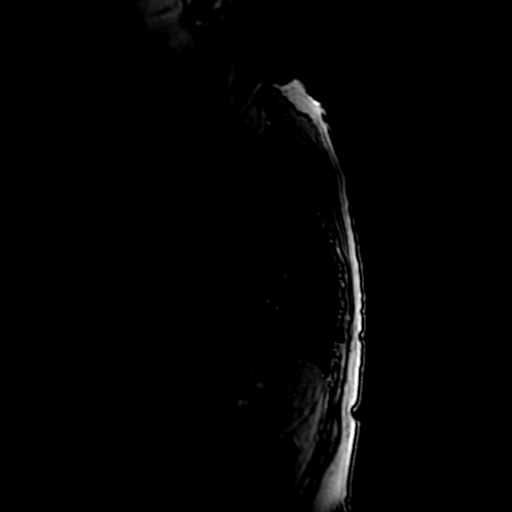

[Series 12: T1 fat-sat post-contrast · sagittal · 3.0mm · 0.66mm/px · 1 of 15 slices shown]
[im 1/15]
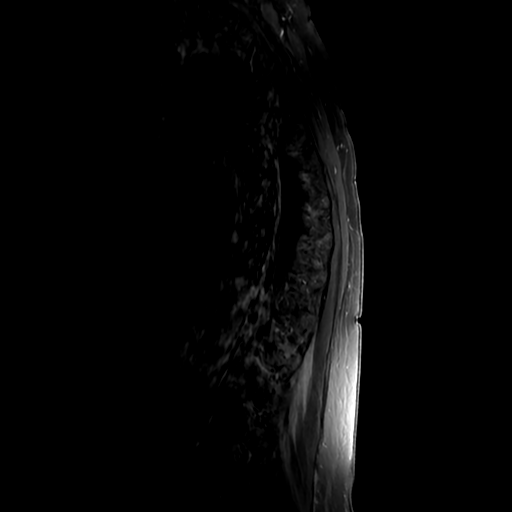

[Series 13: T1 post-contrast · axial · 4.0mm · 0.39mm/px · 1 of 46 slices shown]
[im 1/46]
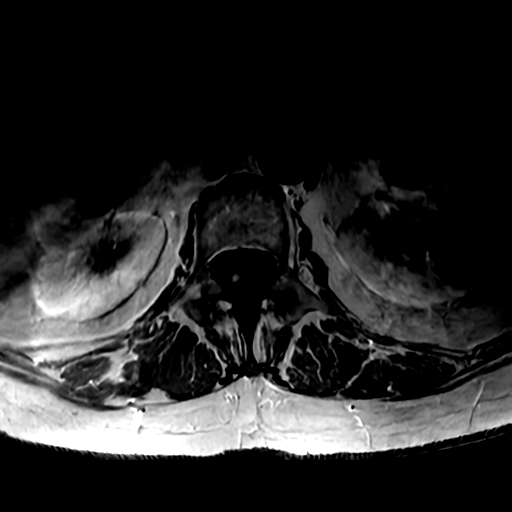

[Series 1100: sub:tricks temp. res. · sagittal · 2.4mm · 0.86mm/px · 1 of 27 slices shown]
[im 1/27]
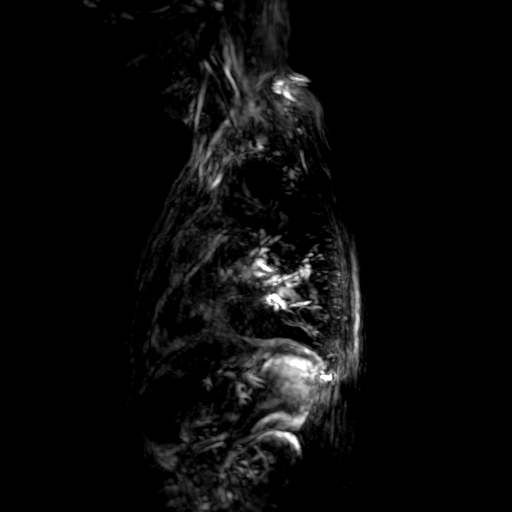

[Series 1101: ph1:sub:tricks temp. res. · sagittal · 2.4mm · 0.86mm/px · 1 of 80 slices shown]
[im 1/80]
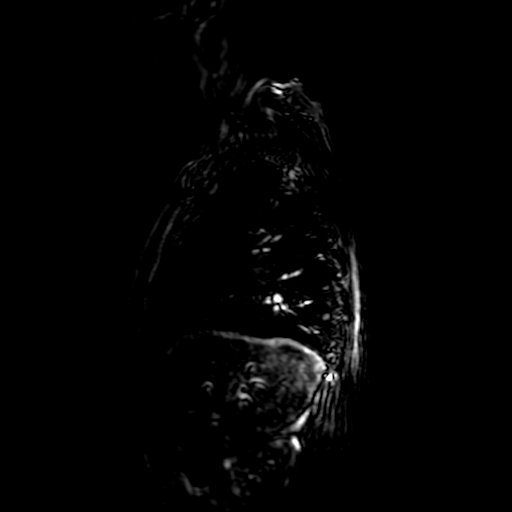

[Series 1102: ph2:sub:tricks temp. res. · sagittal · 2.4mm · 0.86mm/px · 1 of 80 slices shown]
[im 1/80]
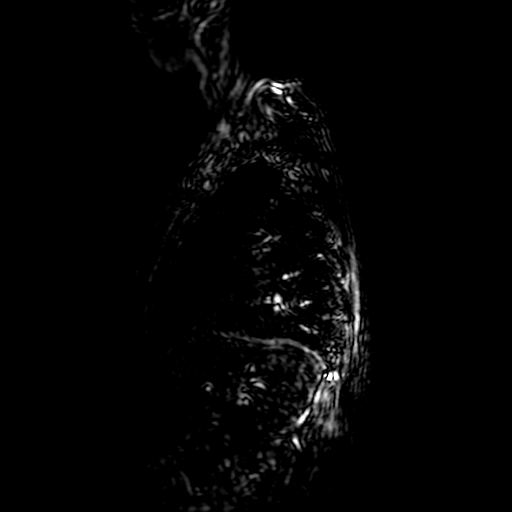

[Series 1103: ph3:sub:tricks temp. res. · sagittal · 2.4mm · 0.86mm/px · 1 of 80 slices shown]
[im 1/80]
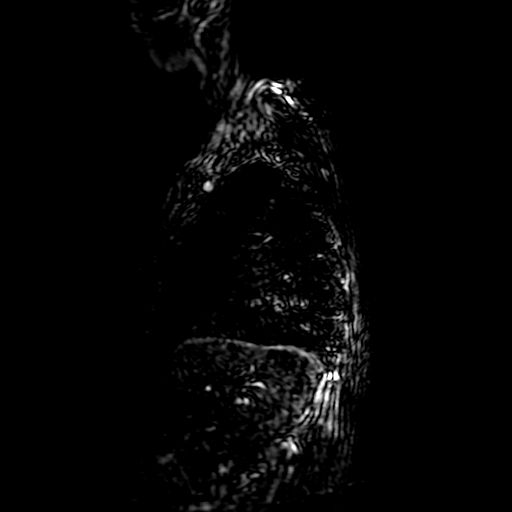

[Series 1104: ph4:sub:tricks temp. res. · sagittal · 2.4mm · 0.86mm/px · 1 of 80 slices shown]
[im 1/80]
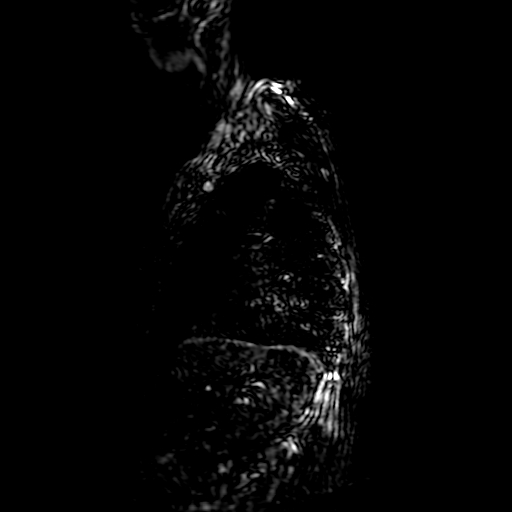

[Series 1105: ph5:sub:tricks temp. res. · sagittal · 2.4mm · 0.86mm/px · 1 of 80 slices shown]
[im 1/80]
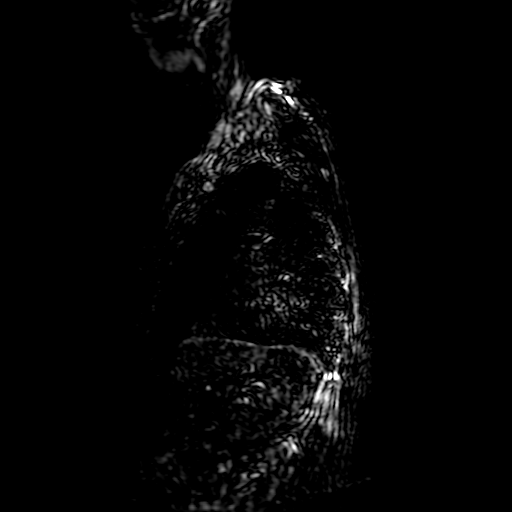

[Series 1106: ph6:sub:tricks temp. res. · sagittal · 2.4mm · 0.86mm/px · 1 of 80 slices shown]
[im 1/80]
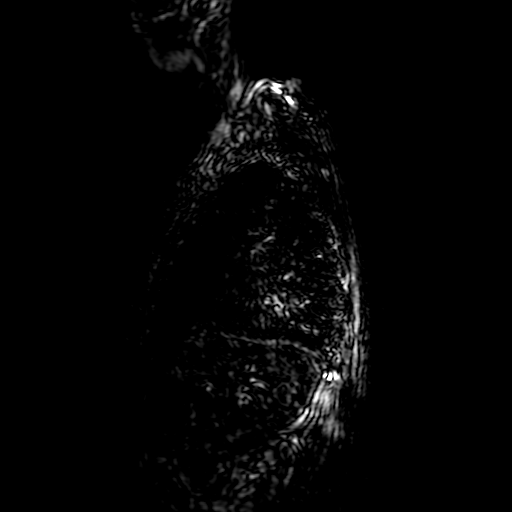

[Series 1107: ph7:sub:tricks temp. res. · sagittal · 2.4mm · 0.86mm/px · 1 of 80 slices shown]
[im 1/80]
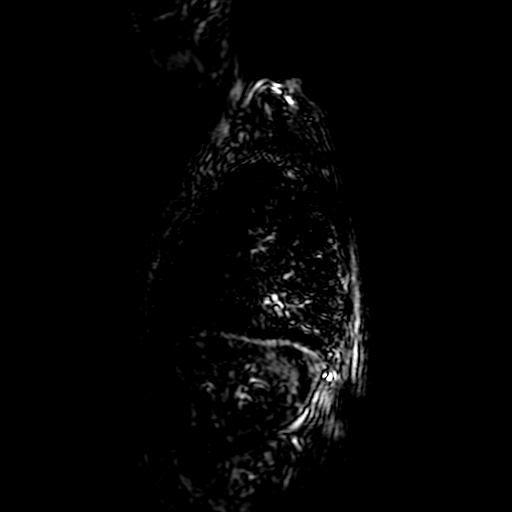

[19 of 48 positions shown; findings below may reference images not displayed]

FINDINGS: Alignment:  Mild scoliosis.

Vertebrae: No fracture, evidence of discitis, or bone lesion.

Cord: Since prior, there is scalloping of the thoracic cord at T4 to
T10 with multiple thin septations at the level of the subdural
space. Alteration of CSF flow, with acceleration preferentially seen
left and ventral to the cord. On postcontrast imaging there is
mildly accentuated surface enhancement at the diseased levels and a
partially covered cauda equina nodule measuring 4 mm at the level of
L1

Paraspinal and other soft tissues: Right upper lobe nodule recently
evaluated by PET CT. No perispinal mass or inflammation.

Disc levels:

Less than typical degenerative change for age. No degenerative
impingement
IMPRESSION: Extensive thoracic subdural septation with mild mass effect on the
non edematous cord. Findings may reflect arachnoiditis from the same
process causing intrathecal hemorrhage on lumbar MRI [DATE]. A
neoplastic process is also considered, a 4 mm nodule is partially
covered along the cauda equina at the level of L1. Consider CSF
chemistry/cytology.

## 2021-11-14 MED ORDER — GADOBUTROL 1 MMOL/ML IV SOLN
6.0000 mL | Freq: Once | INTRAVENOUS | Status: AC | PRN
  Administered 2021-11-14: 6 mL via INTRAVENOUS

## 2021-11-18 ENCOUNTER — Other Ambulatory Visit: Payer: Self-pay | Admitting: Family Medicine

## 2021-11-18 MED ORDER — HYDROCODONE-ACETAMINOPHEN 5-325 MG PO TABS: 1.0000 | ORAL_TABLET | Freq: Four times a day (QID) | ORAL | 0 refills | Status: DC | PRN

## 2021-11-19 DIAGNOSIS — L570 Actinic keratosis: Secondary | ICD-10-CM | POA: Diagnosis not present

## 2021-11-19 DIAGNOSIS — D225 Melanocytic nevi of trunk: Secondary | ICD-10-CM | POA: Diagnosis not present

## 2021-11-19 DIAGNOSIS — I8393 Asymptomatic varicose veins of bilateral lower extremities: Secondary | ICD-10-CM | POA: Diagnosis not present

## 2021-11-19 DIAGNOSIS — D2262 Melanocytic nevi of left upper limb, including shoulder: Secondary | ICD-10-CM | POA: Diagnosis not present

## 2021-11-19 DIAGNOSIS — L821 Other seborrheic keratosis: Secondary | ICD-10-CM | POA: Diagnosis not present

## 2021-11-19 DIAGNOSIS — D492 Neoplasm of unspecified behavior of bone, soft tissue, and skin: Secondary | ICD-10-CM | POA: Diagnosis not present

## 2021-11-19 DIAGNOSIS — Z85828 Personal history of other malignant neoplasm of skin: Secondary | ICD-10-CM | POA: Diagnosis not present

## 2021-11-19 DIAGNOSIS — M5416 Radiculopathy, lumbar region: Secondary | ICD-10-CM | POA: Diagnosis not present

## 2021-11-26 ENCOUNTER — Ambulatory Visit (INDEPENDENT_AMBULATORY_CARE_PROVIDER_SITE_OTHER): Payer: Medicare HMO | Admitting: Emergency Medicine

## 2021-11-26 DIAGNOSIS — R911 Solitary pulmonary nodule: Secondary | ICD-10-CM

## 2021-11-26 LAB — PULMONARY FUNCTION TEST
DL/VA % pred: 81 %
DL/VA: 3.27 ml/min/mmHg/L
DLCO cor % pred: 76 %
DLCO cor: 15.5 ml/min/mmHg
DLCO unc % pred: 76 %
DLCO unc: 15.5 ml/min/mmHg
FEF 25-75 Post: 1.51 L/sec
FEF 25-75 Pre: 1.23 L/sec
FEF2575-%Change-Post: 23 %
FEF2575-%Pred-Post: 90 %
FEF2575-%Pred-Pre: 73 %
FEV1-%Change-Post: 6 %
FEV1-%Pred-Post: 93 %
FEV1-%Pred-Pre: 88 %
FEV1-Post: 2.1 L
FEV1-Pre: 1.97 L
FEV1FVC-%Change-Post: 2 %
FEV1FVC-%Pred-Pre: 92 %
FEV6-%Change-Post: 3 %
FEV6-%Pred-Post: 104 %
FEV6-%Pred-Pre: 100 %
FEV6-Post: 2.97 L
FEV6-Pre: 2.86 L
FEV6FVC-%Change-Post: 0 %
FEV6FVC-%Pred-Post: 104 %
FEV6FVC-%Pred-Pre: 104 %
FVC-%Change-Post: 4 %
FVC-%Pred-Post: 100 %
FVC-%Pred-Pre: 96 %
FVC-Post: 3 L
FVC-Pre: 2.87 L
Post FEV1/FVC ratio: 70 %
Post FEV6/FVC ratio: 99 %
Pre FEV1/FVC ratio: 69 %
Pre FEV6/FVC Ratio: 100 %
RV % pred: 99 %
RV: 2.42 L
TLC % pred: 102 %
TLC: 5.48 L

## 2021-11-26 NOTE — Progress Notes (Signed)
PFT done today. 

## 2021-12-02 ENCOUNTER — Encounter: Payer: Self-pay | Admitting: Emergency Medicine

## 2021-12-02 ENCOUNTER — Ambulatory Visit: Payer: Medicare HMO | Admitting: Emergency Medicine

## 2021-12-02 VITALS — BP 118/62 | HR 72 | Temp 97.7°F | Ht 66.0 in | Wt 145.0 lb

## 2021-12-02 DIAGNOSIS — R911 Solitary pulmonary nodule: Secondary | ICD-10-CM | POA: Diagnosis not present

## 2021-12-02 NOTE — Patient Instructions (Signed)
We will refer you to cardiothoracic surgery to discuss primary resection of a right upper lobe pulmonary nodule Follow Dr. Lamonte Sakai in 1 month

## 2021-12-02 NOTE — Progress Notes (Signed)
Subjective:    Patient ID: Desiree Ortega, female    DOB: January 17, 1946, 76 y.o.   MRN: [id number]  HPI 76 year old former smoker (15 pack years) with a history of hyperlipidemia, migraines, renal calculi, squamous/basal cell skin cancer (resected).  She is referred today for abnormal CT scan of the chest. She reports that she began to have what sounds like neuropathic radicular pain in January. She does not have any respiratory symptoms - no cough, no dyspnea, no wheeze or CP.   CT chest/11/23 reviewed by me shows no enlarged mediastinal or hilar adenopathy, there is a spiculated 2.5 cm right upper lobe nodule, smaller 3-4 mm nodules in the right upper lobe, right lower lobe, left lower lobe.  Also noted emphysematous changes   ROV 12/02/21 --76 year old woman who follows up today for a 2.5 cm right upper lobe spiculated nodule, smaller 3-4 mm scattered nodules, COPD.  Her PET scan 10/31/2021 confirmed mild uptake in the 2.4 cm nodule, no evidence of hypermetabolism in the mediastinum or any evidence of any distant disease.  Pulmonary function testing 11/26/2021 reviewed by me shows mild obstruction without a bronchodilator response, normal lung volumes, decreased diffusion capacity that corrects to the normal range when adjusted for alveolar volume.   Review of Systems As per HPI  Past Medical History:  Diagnosis Date   Allergy    Cancer ([place]) on back,leg,tailbone   prev squam and basal cell skin CA removed- Dr. Evorn Gong   Diverticulosis    Moderate   Female bladder prolapse    H/O cold sores    History of colon polyps    History of kidney stones    Hyperlipidemia    Migraine with aura    Osteopenia    prev on fosamax for a few years then started evista at age ~17, T score improved  from -2.29/-1.96 to -1.8/-1.7 as of 06/2011, repeat DXA done 2015     Family History  Problem Relation Age of Onset   Osteopenia Mother    Hypertension Father    Heart disease Father        AFIB, PVD  stents LE's, pacer   Stroke Father    Cancer Brother        prostate, prostatectomy, radiation 5 years later, Hormone Tx   Prostate cancer Brother    Heart disease Brother    Cancer Brother        prostate, prostatectomy   Prostate cancer Brother    Diabetes Other    Cancer Other        Non-Hodgkin's Lymphoma   Depression Daughter    Alcohol abuse Neg Hx    Drug abuse Neg Hx    Colon cancer Neg Hx    Breast cancer Neg Hx    Bladder Cancer Neg Hx    Kidney cancer Neg Hx      Social History   Socioeconomic History   Marital status: Married    Spouse name: Not on file   Number of children: 4   Years of education: Not on file   Highest education level: Not on file  Occupational History   Occupation: [employer], [employer]    Comment: Teaches swimming  Tobacco Use   Smoking status: Former    Packs/day: 0.50    Years: 36.00    Total pack years: 18.00    Types: Cigarettes    Quit date: 06/22/2001    Years since quitting: 20.4   Smokeless tobacco: Never  Vaping Use  Vaping Use: Never used  Substance and Sexual Activity   Alcohol use: Not Currently    Alcohol/week: 2.0 standard drinks of alcohol    Types: 2 Glasses of wine per week    Comment: occasionally   Drug use: No   Sexual activity: Yes    Birth control/protection: Post-menopausal  Other Topics Concern   Not on file  Social History Narrative   Married 1963, lives with husband   Cubs and [employer] fan   Mother in ALF as of 2022 (pt's mother was born in [place])   4 daughters   Taught swimming prev   Social Determinants of Health   Financial Resource Strain: Low Risk  (08/21/2021)   Overall Financial Resource Strain (CARDIA)    Difficulty of Paying Living Expenses: Not hard at all  Food Insecurity: No Food Insecurity (08/21/2021)   Hunger Vital Sign    Worried About Running Out of Food in the Last Year: Never true    Ran Out of Food in the Last Year: Never true  Transportation Needs: No Transportation  Needs (08/21/2021)   PRAPARE - Hydrologist (Medical): No    Lack of Transportation (Non-Medical): No  Physical Activity: Inactive (08/21/2021)   Exercise Vital Sign    Days of Exercise per Week: 0 days    Minutes of Exercise per Session: 0 min  Stress: No Stress Concern Present (08/21/2021)   [place]    Feeling of Stress : Not at all  Social Connections: Moderately Integrated (08/21/2021)   Social Connection and Isolation Panel [NHANES]    Frequency of Communication with Friends and Family: More than three times a week    Frequency of Social Gatherings with Friends and Family: More than three times a week    Attends Religious Services: More than 4 times per year    Active Member of [employer] or Organizations: No    Attends Archivist Meetings: Never    Marital Status: Married  Human resources officer Violence: Not At Risk (08/21/2021)   Humiliation, Afraid, Rape, and Kick questionnaire    Fear of Current or Ex-Partner: No    Emotionally Abused: No    Physically Abused: No    Sexually Abused: No     Allergies  Allergen Reactions   Alendronate Sodium Other (See Comments)    heartburn   Codeine Nausea Only   Influenza Vaccines     Local reaction to vaccine in 2014   Keflex [Cephalexin] Rash    All around her torso area     Outpatient Medications Prior to Visit  Medication Sig Dispense Refill   acetaminophen (TYLENOL) 500 MG tablet Take 1,000 mg by mouth 2 (two) times daily as needed for mild pain or headache.     Ascorbic Acid (VITAMIN C) 1000 MG tablet Take 1 tablet (1,000 mg total) by mouth daily.     atorvastatin (LIPITOR) 10 MG tablet Take 1 tablet (10 mg total) by mouth daily. 90 tablet 3   b complex vitamins tablet Take 1 tablet by mouth daily.     Cholecalciferol (VITAMIN D PO) Take 1,000 Units by mouth daily.     CINNAMON PO Take 2,000 mg by mouth daily.     Cyanocobalamin (B-12  PO) Take 1,000 mcg by mouth daily.     fexofenadine (ALLEGRA) 180 MG tablet Take 1 tablet (180 mg total) by mouth as needed. Seasonal allergies from Spring to Fall  Flaxseed, Linseed, (FLAX SEEDS PO) Take 1 capsule by mouth daily.     HYDROcodone-acetaminophen (NORCO) 5-325 MG tablet Take 1 tablet by mouth every 6 (six) hours as needed for severe pain. 30 tablet 0   Magnesium 250 MG TABS Take 250 mg by mouth daily.     Melatonin 3 MG TABS Take 1 tablet (3 mg total) by mouth at bedtime as needed.     Multiple Vitamin (MULTIVITAMIN) capsule Take 1 capsule by mouth daily.     Omega-3 Fatty Acids (FISH OIL PO) Take 1,000 mg by mouth in the morning and at bedtime.     Turmeric 500 MG CAPS Take 500 mg by mouth daily.     Ubiquinol 100 MG CAPS Take 100 mg by mouth daily.     Zinc 50 MG CAPS Take 50 mg by mouth daily.     No facility-administered medications prior to visit.          Objective:   Physical Exam  Vitals:   12/02/21 1633  BP: 118/62  Pulse: 72  Temp: 97.7 F (36.5 C)  TempSrc: Oral  SpO2: 98%  Weight: 145 lb (65.8 kg)  Height: 5\' 6"  (1.676 m)   Gen: Pleasant, well-nourished, in no distress,  normal affect  ENT: No lesions,  mouth clear,  oropharynx clear, no postnasal drip  Neck: No JVD, no stridor  Lungs: No use of accessory muscles, no crackles or wheezing on normal respiration, no wheeze on forced expiration  Cardiovascular: RRR, heart sounds normal, no murmur or gallops, no peripheral edema  Musculoskeletal: No deformities, no cyanosis or clubbing  Neuro: alert, awake, non focal  Skin: Warm, no lesions or rash     Assessment & Plan:  Pulmonary nodule 1 cm or greater in diameter We discussed her pulmonary function testing, PET scan in detail.  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  No evidence of distant disease.  We talked about the options including watchful waiting with  repeat CT, bronchoscopy for tissue diagnosis, referral for primary resection.  Ultimately we have decided to pursue referral to thoracic surgery to at least discuss surgery.  We will make the referral today.    Baltazar Apo, MD, PhD 12/02/2021, 5:23 PM [facility] [phone] or if no answer before 7:00PM call [phone] For any issues after 7:00PM please call eLink [phone]

## 2021-12-02 NOTE — Assessment & Plan Note (Signed)
We discussed her pulmonary function testing, PET scan in detail.  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  No evidence of distant disease.  We talked about the options including watchful waiting with repeat CT, bronchoscopy for tissue diagnosis, referral for primary resection.  Ultimately we have decided to pursue referral to thoracic surgery to at least discuss surgery.  We will make the referral today.

## 2021-12-04 ENCOUNTER — Other Ambulatory Visit (INDEPENDENT_AMBULATORY_CARE_PROVIDER_SITE_OTHER): Payer: Medicare HMO

## 2021-12-04 DIAGNOSIS — I2510 Atherosclerotic heart disease of native coronary artery without angina pectoris: Secondary | ICD-10-CM | POA: Diagnosis not present

## 2021-12-04 LAB — LIPID PANEL
Cholesterol: 185 mg/dL (ref 0–200)
HDL: 63.7 mg/dL (ref >39.00)
LDL Cholesterol: 87 mg/dL (ref 0–99)
NonHDL: 121.34
Total CHOL/HDL Ratio: 3
Triglycerides: 170 mg/dL — ABNORMAL HIGH (ref 0.0–149.0)
VLDL: 34 mg/dL (ref 0.0–40.0)

## 2021-12-15 ENCOUNTER — Ambulatory Visit (INDEPENDENT_AMBULATORY_CARE_PROVIDER_SITE_OTHER): Payer: Medicare HMO | Admitting: Family Medicine

## 2021-12-15 ENCOUNTER — Encounter: Payer: Self-pay | Admitting: Family Medicine

## 2021-12-15 DIAGNOSIS — M5431 Sciatica, right side: Secondary | ICD-10-CM

## 2021-12-15 DIAGNOSIS — M5432 Sciatica, left side: Secondary | ICD-10-CM | POA: Diagnosis not present

## 2021-12-15 DIAGNOSIS — R911 Solitary pulmonary nodule: Secondary | ICD-10-CM | POA: Diagnosis not present

## 2021-12-15 DIAGNOSIS — H6120 Impacted cerumen, unspecified ear: Secondary | ICD-10-CM | POA: Diagnosis not present

## 2021-12-15 MED ORDER — HYDROCODONE-ACETAMINOPHEN 5-325 MG PO TABS: 1.0000 | ORAL_TABLET | Freq: Four times a day (QID) | ORAL | 0 refills | Status: DC | PRN

## 2021-12-15 MED ORDER — B-12 1000 MCG PO TBCR: 2000.0000 ug | EXTENDED_RELEASE_TABLET | Freq: Every day | ORAL | Status: AC

## 2021-12-15 NOTE — Progress Notes (Signed)
She is in less back pain, not resolved but clearly better.  Still with R leg pain and lower back but better overall.  She is going to f/u with Dr. Annette Stable later this fall.  She is tapering hydrocodone, down to 1 tab a day.  She has pain after doing AM chores but then the med used midday carries her most of the rest of the day.  No ADE on hydrocodone.  D/w pt about tapering as pain allows.  Rx sent.  Routine cautions given to patient.  Lipids clearly improved from prior.  No ADE on lipitor.  Labs d/w pt.    She has thoracic surgery f/u pending, discussed that this was reasonable given her previous imaging.  Discussed recommendation from Dr. Lamonte Sakai.  I have high confidence in both the pulmonary service and the cardiothoracic surgery service, discussed with patient.  L ear sx, occ in R ear.  No pain but feels muffled.  No stuffy note.  No FCNAVD.  No ST.  Taking allegra in the AM and it helps some.    Meds, vitals, and allergies reviewed.   ROS: Per HPI unless specifically indicated in ROS section   Nad ncat Cerumen impaction L canal She has TM movement with valsalva B.  Hearing improved with cerumen removal, she consented for irrigation.  Tolerated well. Neck supple, no LA Rrr Ctab Abd soft, not ttp Skin well perfused.  Extremities without edema.  35 minutes were devoted to patient care in this encounter (this includes time spent reviewing the patient's file/history, interviewing and examining the patient, counseling/reviewing plan with patient).

## 2021-12-17 NOTE — Assessment & Plan Note (Signed)
Resolved

## 2021-12-17 NOTE — Assessment & Plan Note (Signed)
She has follow-up pending with Dr. Annette Stable.  Continue to taper hydrocodone.  Routine cautions given to patient.  Prescription sent.  She will update me as needed.  Fortunately she is clearly better.

## 2021-12-17 NOTE — Assessment & Plan Note (Signed)
She has thoracic surgery f/u pending, discussed that this was reasonable given her previous imaging.  Discussed recommendation from Dr. Lamonte Sakai.  I have high confidence in both the pulmonary service and the cardiothoracic surgery service, discussed with patient.

## 2021-12-24 NOTE — H&P (View-Only) (Signed)
[address]       [place] [address]             [phone]                    Nelda K Manship [facility] Medical Record #[id number] Date of Birth: 02-Apr-1946  Referring: Collene Gobble, MD Primary Care: Tonia Ghent, MD Primary Cardiologist: None  Chief Complaint:    Chief Complaint  Patient presents with   Lung Lesion    Surgical consult, PFT's 11/26/21/ PET Scan 10/31/21/ Chest CT 09/30/21    History of Present Illness:    Desiree Ortega 76 y.o. female referred by Dr. Lamonte Sakai for surgical evaluation of a 2.4 cm right upper lobe pulmonary nodule.  This was found incidentally while she was being spinal issues.  She is a former smoker but quit in 2003.  She admits to a 20 pound weight loss.  She denies any shortness of breath or coughs.  She has had some neuropathic radicular pain, but this is all related to her spinal issues.     Zubrod Score: At the time of surgery this patient's most appropriate activity status/level should be described as: [x]     0    Normal activity, no symptoms []     1    Restricted in physical strenuous activity but ambulatory, able to do out light work []     2    Ambulatory and capable of self care, unable to do work activities, up and about               >50 % of waking hours                              []     3    Only limited self care, in bed greater than 50% of waking hours []     4    Completely disabled, no self care, confined to bed or chair []     5    Moribund   Past Medical History:  Diagnosis Date   Allergy    Cancer ([place]) on back,leg,tailbone   prev squam and basal cell skin CA removed- Dr. Evorn Gong   Diverticulosis    Moderate   Female bladder prolapse    H/O cold sores    History of colon polyps    History of kidney stones    Hyperlipidemia    Migraine with aura    Osteopenia    prev on fosamax for a few years then started evista at age ~29, T score improved  from -2.29/-1.96 to -1.8/-1.7 as of 06/2011,  repeat DXA done 2015    Past Surgical History:  Procedure Laterality Date   BREAST BIOPSY Left    benign   CATARACT EXTRACTION W/ INTRAOCULAR LENS IMPLANT Left 11/2016   CATARACT EXTRACTION W/ INTRAOCULAR LENS IMPLANT Right 12/22/2016   COLONOSCOPY  08/15/2013   cyst on back     2 times   cyst on eyelid     right eye   CYSTOCELE REPAIR N/A 02/01/2018   Procedure: ANTERIOR REPAIR (CYSTOCELE);  Surgeon: Bjorn Loser, MD;  Location: [facility];  Service: Urology;  Laterality: N/A;   CYSTOSCOPY N/A 02/01/2018   Procedure: cystoscopy;  Surgeon: Bjorn Loser, MD;  Location: [facility];  Service: Urology;  Laterality: N/A;   LAPAROSCOPIC VAGINAL HYSTERECTOMY WITH SALPINGO OOPHORECTOMY Bilateral 02/01/2018  Procedure: LAPAROSCOPIC ASSISTED VAGINAL HYSTERECTOMY WITH BILATERAL SALPINGO OOPHORECTOMY;  Surgeon: Servando Salina, MD;  Location: [facility];  Service: Gynecology;  Laterality: Bilateral;   SQUAMOUS CELL CARCINOMA EXCISION     on back/removed 2 times/and thigh   SQUAMOUS CELL CARCINOMA EXCISION Left 07/15/2016   left lower calf   SSC removed  1988   Birthmark removal partial 1/3   TONSILLECTOMY  1950   TUBAL LIGATION  60's    Family History  Problem Relation Age of Onset   Osteopenia Mother    Hypertension Father    Heart disease Father        AFIB, PVD stents LE's, pacer   Stroke Father    Cancer Brother        prostate, prostatectomy, radiation 5 years later, Hormone Tx   Prostate cancer Brother    Heart disease Brother    Cancer Brother        prostate, prostatectomy   Prostate cancer Brother    Diabetes Other    Cancer Other        Non-Hodgkin's Lymphoma   Depression Daughter    Alcohol abuse Neg Hx    Drug abuse Neg Hx    Colon cancer Neg Hx    Breast cancer Neg Hx    Bladder Cancer Neg Hx    Kidney cancer Neg Hx      Social History   Tobacco Use  Smoking Status Former   Packs/day: 0.50   Years: 36.00   Total pack years: 18.00   Types: Cigarettes    Quit date: 06/22/2001   Years since quitting: 20.5  Smokeless Tobacco Never    Social History   Substance and Sexual Activity  Alcohol Use Not Currently   Alcohol/week: 2.0 standard drinks of alcohol   Types: 2 Glasses of wine per week   Comment: occasionally     Allergies  Allergen Reactions   Alendronate Sodium Other (See Comments)    heartburn   Codeine Nausea Only   Influenza Vaccines     Local reaction to vaccine in 2014   Keflex [Cephalexin] Rash    All around her torso area    Current Outpatient Medications  Medication Sig Dispense Refill   acetaminophen (TYLENOL) 500 MG tablet Take 1,000 mg by mouth 2 (two) times daily as needed for mild pain or headache.     Ascorbic Acid (VITAMIN C) 1000 MG tablet Take 1 tablet (1,000 mg total) by mouth daily.     atorvastatin (LIPITOR) 10 MG tablet Take 1 tablet (10 mg total) by mouth daily. 90 tablet 3   b complex vitamins tablet Take 1 tablet by mouth daily.     Calcium Carbonate (CALCIUM 600 PO) Take by mouth.     Cholecalciferol (VITAMIN D PO) Take 1,000 Units by mouth daily.     CINNAMON PO Take 2,000 mg by mouth daily.     Cyanocobalamin (B-12) 1000 MCG TBCR Take 2,000 mcg by mouth daily.     fexofenadine (ALLEGRA) 180 MG tablet Take 1 tablet (180 mg total) by mouth as needed. Seasonal allergies from Spring to Fall     Flaxseed, Linseed, (FLAX SEEDS PO) Take 1 capsule by mouth daily.     HYDROcodone-acetaminophen (NORCO) 5-325 MG tablet Take 1 tablet by mouth every 6 (six) hours as needed for severe pain. 30 tablet 0   Magnesium 250 MG TABS Take 250 mg by mouth daily.     Melatonin 3 MG TABS Take 1 tablet (3 mg  total) by mouth at bedtime as needed.     Multiple Vitamin (MULTIVITAMIN) capsule Take 1 capsule by mouth daily.     Omega-3 Fatty Acids (FISH OIL PO) Take 1,000 mg by mouth in the morning and at bedtime.     Turmeric 500 MG CAPS Take 500 mg by mouth daily.     Ubiquinol 100 MG CAPS Take 100 mg by mouth daily.      Zinc 50 MG CAPS Take 50 mg by mouth daily.     No current facility-administered medications for this visit.    Review of Systems  Constitutional:  Positive for weight loss. Negative for malaise/fatigue.  Respiratory:  Negative for cough and shortness of breath.   Cardiovascular:  Negative for chest pain.  Musculoskeletal:  Positive for back pain, myalgias and neck pain.  Neurological:  Positive for tingling and sensory change. Negative for dizziness and headaches.     PHYSICAL EXAMINATION: BP (!) 177/66   Pulse 90   Resp 20   Ht 5\' 6"  (1.676 m)   Wt 143 lb (64.9 kg)   SpO2 95% Comment: RA  BMI 23.08 kg/m  Physical Exam Constitutional:      General: She is not in acute distress.    Appearance: Normal appearance. She is normal weight. She is not ill-appearing.  Eyes:     Extraocular Movements: Extraocular movements intact.  Cardiovascular:     Rate and Rhythm: Normal rate.     Heart sounds: No murmur heard. Pulmonary:     Effort: Pulmonary effort is normal. No respiratory distress.     Breath sounds: Normal breath sounds.  Abdominal:     General: Abdomen is flat. There is no distension.  Musculoskeletal:        General: Normal range of motion.     Cervical back: Normal range of motion.  Skin:    General: Skin is warm and dry.  Neurological:     General: No focal deficit present.     Mental Status: She is alert and oriented to person, place, and time.     Diagnostic Studies & Laboratory data:     Recent Radiology Findings:   No results found.     I have independently reviewed the above radiology studies  and reviewed the findings with the patient.   Recent Lab Findings: Lab Results  Component Value Date   WBC 9.0 07/28/2021   HGB 14.5 07/28/2021   HCT 42.5 07/28/2021   PLT 291 07/28/2021   GLUCOSE 136 (H) 07/28/2021   CHOL 185 12/04/2021   TRIG 170.0 (H) 12/04/2021   HDL 63.70 12/04/2021   LDLDIRECT 156.0 07/04/2013   LDLCALC 87 12/04/2021   ALT 13  07/15/2021   AST 20 07/15/2021   NA 136 07/28/2021   K 4.4 07/28/2021   CL 101 07/28/2021   CREATININE 0.71 07/28/2021   BUN 17 07/28/2021   CO2 26 07/28/2021   TSH 1.25 06/17/2010   INR 0.90 01/27/2018     PFTs:  - FVC: 96% - FEV1: 88% -DLCO: 72%  Problem List: 2.4 cm right upper lobe pulmonary nodule shows mild FDG uptake, but is not hypermetabolic.     Assessment / Plan:   76 year old female with a 2.4 cm right upper lobe pulmonary nodule with minimal uptake on PET/CT.  We discussed several options for obtaining a tissue diagnosis, and the patient is elected to proceed directly with a surgical biopsy.  If this is positive for primary lung cancer we will  then proceed with robotic assisted right upper lobectomy.  The risks and benefits have been discussed.     I  spent 40 minutes with  the patient face to face in counseling and coordination of care.    Lajuana Matte 12/25/2021 1:03 PM

## 2021-12-24 NOTE — Progress Notes (Unsigned)
[name] Record #[id number] Date of Birth: Mar 26, 1946  Referring: Tonia Ghent, MD Primary Care: Tonia Ghent, MD Primary Cardiologist: None  Chief Complaint:   No chief complaint on file.   History of Present Illness:    Desiree Ortega 76 y.o. female ***   76 year old former smoker (15 pack years) with a history of hyperlipidemia, migraines, renal calculi, squamous/basal cell skin cancer (resected).  She is referred today for abnormal CT scan of the chest. She reports that she began to have what sounds like neuropathic radicular pain in January. She does not have any respiratory symptoms - no cough, no dyspnea, no wheeze or CP.    CT chest/11/23 reviewed by me shows no enlarged mediastinal or hilar adenopathy, there is a spiculated 2.5 cm right upper lobe nodule, smaller 3-4 mm nodules in the right upper lobe, right lower lobe, left lower lobe.  Also noted emphysematous changes  Smoking Hx: ***   Zubrod Score: At the time of surgery this patient's most appropriate activity status/level should be described as: []     0    Normal activity, no symptoms []     1    Restricted in physical strenuous activity but ambulatory, able to do out light work []     2    Ambulatory and capable of self care, unable to do work activities, up and about               >50 % of waking hours                              []     3    Only limited self care, in bed greater than 50% of waking hours []     4    Completely disabled, no self care, confined to bed or chair []     5    Moribund   Past Medical History:  Diagnosis Date   Allergy    Cancer ([place]) on back,leg,tailbone   prev squam and basal cell skin CA removed- Dr. Evorn Gong   Diverticulosis    Moderate   Female bladder prolapse    H/O cold sores    History of colon polyps    History of kidney stones     Hyperlipidemia    Migraine with aura    Osteopenia    prev on fosamax for a few years then started evista at age ~76, T score improved  from -2.29/-1.96 to -1.8/-1.7 as of 06/2011, repeat DXA done 2015    Past Surgical History:  Procedure Laterality Date   BREAST BIOPSY Left    benign   CATARACT EXTRACTION W/ INTRAOCULAR LENS IMPLANT Left 11/2016   CATARACT EXTRACTION W/ INTRAOCULAR LENS IMPLANT Right 12/22/2016   COLONOSCOPY  08/15/2013   cyst on back     2 times   cyst on eyelid     right eye   CYSTOCELE REPAIR N/A 02/01/2018   Procedure: ANTERIOR REPAIR (CYSTOCELE);  Surgeon: Bjorn Loser, MD;  Location: [facility];  Service: Urology;  Laterality: N/A;   CYSTOSCOPY N/A 02/01/2018   Procedure: cystoscopy;  Surgeon: Bjorn Loser, MD;  Location: [facility];  Service: Urology;  Laterality: N/A;   LAPAROSCOPIC VAGINAL HYSTERECTOMY WITH SALPINGO OOPHORECTOMY Bilateral 02/01/2018   Procedure: LAPAROSCOPIC ASSISTED VAGINAL HYSTERECTOMY WITH BILATERAL SALPINGO OOPHORECTOMY;  Surgeon: Servando Salina, MD;  Location: [facility];  Service: Gynecology;  Laterality: Bilateral;   SQUAMOUS CELL CARCINOMA EXCISION     on back/removed 2 times/and thigh   SQUAMOUS CELL CARCINOMA EXCISION Left 07/15/2016   left lower calf   SSC removed  1988   Birthmark removal partial 1/3   TONSILLECTOMY  1950   TUBAL LIGATION  61's    Family History  Problem Relation Age of Onset   Osteopenia Mother    Hypertension Father    Heart disease Father        AFIB, PVD stents LE's, pacer   Stroke Father    Cancer Brother        prostate, prostatectomy, radiation 5 years later, Hormone Tx   Prostate cancer Brother    Heart disease Brother    Cancer Brother        prostate, prostatectomy   Prostate cancer Brother    Diabetes Other    Cancer Other        Non-Hodgkin's Lymphoma   Depression Daughter    Alcohol abuse Neg Hx    Drug abuse Neg Hx    Colon cancer Neg Hx    Breast cancer Neg Hx    Bladder  Cancer Neg Hx    Kidney cancer Neg Hx      Social History   Tobacco Use  Smoking Status Former   Packs/day: 0.50   Years: 36.00   Total pack years: 18.00   Types: Cigarettes   Quit date: 06/22/2001   Years since quitting: 20.5  Smokeless Tobacco Never    Social History   Substance and Sexual Activity  Alcohol Use Not Currently   Alcohol/week: 2.0 standard drinks of alcohol   Types: 2 Glasses of wine per week   Comment: occasionally     Allergies  Allergen Reactions   Alendronate Sodium Other (See Comments)    heartburn   Codeine Nausea Only   Influenza Vaccines     Local reaction to vaccine in 2014   Keflex [Cephalexin] Rash    All around her torso area    Current Outpatient Medications  Medication Sig Dispense Refill   acetaminophen (TYLENOL) 500 MG tablet Take 1,000 mg by mouth 2 (two) times daily as needed for mild pain or headache.     Ascorbic Acid (VITAMIN C) 1000 MG tablet Take 1 tablet (1,000 mg total) by mouth daily.     atorvastatin (LIPITOR) 10 MG tablet Take 1 tablet (10 mg total) by mouth daily. 90 tablet 3   b complex vitamins tablet Take 1 tablet by mouth daily.     Calcium Carbonate (CALCIUM 600 PO) Take by mouth.     Cholecalciferol (VITAMIN D PO) Take 1,000 Units by mouth daily.     CINNAMON PO Take 2,000 mg by mouth daily.     Cyanocobalamin (B-12) 1000 MCG TBCR Take 2,000 mcg by mouth daily.     fexofenadine (ALLEGRA) 180 MG tablet Take 1 tablet (180 mg total) by mouth as needed. Seasonal allergies from Spring to Fall     Flaxseed, Linseed, (FLAX SEEDS PO) Take 1 capsule by mouth daily.     HYDROcodone-acetaminophen (NORCO) 5-325 MG tablet Take 1 tablet by mouth every 6 (six) hours as needed for severe pain. 30 tablet 0   Magnesium 250  MG TABS Take 250 mg by mouth daily.     Melatonin 3 MG TABS Take 1 tablet (3 mg total) by mouth at bedtime as needed.     Multiple Vitamin (MULTIVITAMIN) capsule Take 1 capsule by mouth daily.     Omega-3 Fatty  Acids (FISH OIL PO) Take 1,000 mg by mouth in the morning and at bedtime.     Turmeric 500 MG CAPS Take 500 mg by mouth daily.     Ubiquinol 100 MG CAPS Take 100 mg by mouth daily.     Zinc 50 MG CAPS Take 50 mg by mouth daily.     No current facility-administered medications for this visit.    ROS   PHYSICAL EXAMINATION: There were no vitals taken for this visit. Physical Exam  Diagnostic Studies & Laboratory data:     Recent Radiology Findings:   No results found.     I have independently reviewed the above radiology studies  and reviewed the findings with the patient.   Recent Lab Findings: Lab Results  Component Value Date   WBC 9.0 07/28/2021   HGB 14.5 07/28/2021   HCT 42.5 07/28/2021   PLT 291 07/28/2021   GLUCOSE 136 (H) 07/28/2021   CHOL 185 12/04/2021   TRIG 170.0 (H) 12/04/2021   HDL 63.70 12/04/2021   LDLDIRECT 156.0 07/04/2013   LDLCALC 87 12/04/2021   ALT 13 07/15/2021   AST 20 07/15/2021   NA 136 07/28/2021   K 4.4 07/28/2021   CL 101 07/28/2021   CREATININE 0.71 07/28/2021   BUN 17 07/28/2021   CO2 26 07/28/2021   TSH 1.25 06/17/2010   INR 0.90 01/27/2018     PFTs:  - FVC: 96% - FEV1: 88% -DLCO: 72%  Problem List: 2.4 cm right upper lobe pulmonary nodule shows mild FDG uptake, but is not hypermetabolic.     Assessment / Plan:   ***     I  spent {CHL ONC TIME VISIT - XNTZG:[id number]} with  the patient face to face in counseling and coordination of care.    Lajuana Matte 12/24/2021 9:15 AM

## 2021-12-25 ENCOUNTER — Other Ambulatory Visit: Payer: Self-pay

## 2021-12-25 ENCOUNTER — Institutional Professional Consult (permissible substitution): Payer: Medicare HMO | Admitting: Thoracic Surgery (Cardiothoracic Vascular Surgery)

## 2021-12-25 VITALS — BP 177/66 | HR 90 | Resp 20 | Ht 66.0 in | Wt 143.0 lb

## 2021-12-25 DIAGNOSIS — R911 Solitary pulmonary nodule: Secondary | ICD-10-CM

## 2021-12-26 ENCOUNTER — Encounter: Payer: Medicare HMO | Admitting: Thoracic Surgery (Cardiothoracic Vascular Surgery)

## 2021-12-26 ENCOUNTER — Encounter: Payer: Self-pay | Admitting: *Deleted

## 2022-01-02 ENCOUNTER — Encounter: Payer: Self-pay | Admitting: Family Medicine

## 2022-01-02 NOTE — Pre-Procedure Instructions (Signed)
Desiree Ortega  01/02/2022     Surgical Instructions   Your procedure is scheduled on Wed., July, 19, 2023 from 8:30AM-11:32AM.  Report to [facility] Main Entrance "A" at 6:30 A.M., then check in with the Admitting office.  Call this number if you have problems the morning of surgery:  [phone]   Remember:  Do not eat or drink after midnight on July 18th     Take these medicines the morning of surgery with A SIP OF WATER: Atorvastatin (LIPITOR)  If Needed: Acetaminophen (TYLENOL) Fexofenadine (ALLEGRA) HYDROcodone-acetaminophen ([place])  As of today, STOP taking any Aspirin (unless otherwise instructed by your surgeon) Aleve, Naproxen, Ibuprofen, Motrin, Advil, Goody's, BC's, all herbal medications, fish oil, and all vitamins.             Day of Surgery: Do not wear jewelry or makeup Do not wear lotions, powders, perfumes, or deodorant. Do not shave 48 hours prior to surgery.   Do not bring valuables to the hospital. DO Not wear nail polish, gel polish, artificial nails, or any other type of covering on natural nails (fingers and toes) If you have artificial nails or gel coating that need to be removed by a nail salon, please have this removed prior to surgery. Artificial nails or gel coating may interfere with anesthesia's ability to adequately monitor your vital signs.             [facility] is not responsible for any belongings or valuables.  Do NOT Smoke (Tobacco/Vaping)  24 hours prior to your procedure  If you use a CPAP at night, you may bring your mask and machine for your overnight stay.   Contacts, glasses, hearing aids, dentures or partials may not be worn into surgery, please bring cases for these belongings   For patients admitted to the hospital, discharge time will be determined by your treatment team.   Patients discharged the day of surgery will not be allowed to drive home, and someone needs to stay with them for 24 hours.  Special instructions:     Oral Hygiene is also important to reduce your risk of infection.  Remember - BRUSH YOUR TEETH THE MORNING OF SURGERY WITH YOUR REGULAR TOOTHPASTE  [facility]- Preparing For Surgery  Before surgery, you can play an important role. Because skin is not sterile, your skin needs to be as free of germs as possible. You can reduce the number of germs on your skin by washing with CHG (chlorahexidine gluconate) Soap before surgery.  CHG is an antiseptic cleaner which kills germs and bonds with the skin to continue killing germs even after washing.    Please do not use if you have an allergy to CHG or antibacterial soaps. If your skin becomes reddened/irritated stop using the CHG.  Do not shave (including legs and underarms) for at least 48 hours prior to first CHG shower. It is OK to shave your face.  Please follow these instructions carefully.    Shower the NIGHT BEFORE SURGERY and the MORNING OF SURGERY with CHG Soap.   If you chose to wash your hair, wash your hair first as usual with your normal shampoo. After you shampoo, rinse your hair and body thoroughly to remove the shampoo.  Then [employer] and genitals (private parts) with your normal soap and rinse thoroughly to remove soap.  After that Use CHG Soap as you would any other liquid soap. You can apply CHG directly to the skin and wash  gently with a scrungie or a clean washcloth.   Apply the CHG Soap to your body ONLY FROM THE NECK DOWN.  Do not use on open wounds or open sores. Avoid contact with your eyes, ears, mouth and genitals (private parts). Wash Face and genitals (private parts)  with your normal soap.   Wash thoroughly, paying special attention to the area where your surgery will be performed.  Thoroughly rinse your body with warm water from the neck down.  DO NOT shower/wash with your normal soap after using and rinsing off the CHG Soap.  Pat yourself dry with a CLEAN TOWEL.  Wear CLEAN PAJAMAS to bed the night before  surgery  Place CLEAN SHEETS on your bed the night before your surgery  DO NOT SLEEP WITH PETS.  Reminder: Take a shower with CHG soap. Wear Clean/Comfortable clothing the morning of surgery Do not apply any deodorants/lotions.   Remember to brush your teeth WITH YOUR REGULAR TOOTHPASTE.  If you have been in contact with anyone that has tested positive in the last 10 days please notify you surgeon.  Notify your provider:  if you develop a fever of 100.4 or greater, sneezing, cough, sore throat, shortness of breath or body aches.  NO VISITORS WILL BE ALLOWED IN PRE-OP WHERE PATIENTS ARE PREPPED FOR SURGERY.    SURGICAL WAITING ROOM VISITATION Patients having surgery or a procedure in a hospital may have two support people. Children under the age of 2 must have an adult with them who is not the patient. They may stay in the waiting area during the procedure and may switch out with other visitors. If the patient needs to stay at the hospital during part of their recovery, the visitor guidelines for inpatient rooms apply.  Please refer to the [facility] website for the visitor guidelines for Inpatients (after your surgery is over and you are in a regular room).   Please read over the following fact sheets that you were given.

## 2022-01-05 ENCOUNTER — Encounter (HOSPITAL_COMMUNITY)
Admission: RE | Admit: 2022-01-05 | Discharge: 2022-01-05 | Disposition: A | Discharge status: Home / Self Care | Payer: Medicare HMO | Source: Ambulatory Visit | Attending: Thoracic Surgery (Cardiothoracic Vascular Surgery) | Admitting: Thoracic Surgery (Cardiothoracic Vascular Surgery)

## 2022-01-05 ENCOUNTER — Other Ambulatory Visit: Payer: Self-pay

## 2022-01-05 ENCOUNTER — Ambulatory Visit (HOSPITAL_COMMUNITY)
Admission: RE | Admit: 2022-01-05 | Discharge: 2022-01-05 | Disposition: A | Discharge status: Home / Self Care | Payer: Medicare HMO | Source: Ambulatory Visit | Attending: Thoracic Surgery (Cardiothoracic Vascular Surgery) | Admitting: Thoracic Surgery (Cardiothoracic Vascular Surgery)

## 2022-01-05 ENCOUNTER — Encounter (HOSPITAL_COMMUNITY): Payer: Self-pay

## 2022-01-05 VITALS — BP 149/76 | HR 72 | Temp 97.8°F | Resp 18 | Ht 66.0 in | Wt 145.5 lb

## 2022-01-05 DIAGNOSIS — R911 Solitary pulmonary nodule: Secondary | ICD-10-CM

## 2022-01-05 DIAGNOSIS — Z885 Allergy status to narcotic agent status: Secondary | ICD-10-CM | POA: Diagnosis not present

## 2022-01-05 DIAGNOSIS — C3411 Malignant neoplasm of upper lobe, right bronchus or lung: Secondary | ICD-10-CM | POA: Diagnosis not present

## 2022-01-05 DIAGNOSIS — D649 Anemia, unspecified: Secondary | ICD-10-CM | POA: Diagnosis not present

## 2022-01-05 DIAGNOSIS — Z79899 Other long term (current) drug therapy: Secondary | ICD-10-CM | POA: Diagnosis not present

## 2022-01-05 DIAGNOSIS — Z85828 Personal history of other malignant neoplasm of skin: Secondary | ICD-10-CM | POA: Diagnosis not present

## 2022-01-05 DIAGNOSIS — Z881 Allergy status to other antibiotic agents status: Secondary | ICD-10-CM | POA: Diagnosis not present

## 2022-01-05 DIAGNOSIS — J9382 Other air leak: Secondary | ICD-10-CM | POA: Diagnosis not present

## 2022-01-05 DIAGNOSIS — E785 Hyperlipidemia, unspecified: Secondary | ICD-10-CM | POA: Diagnosis not present

## 2022-01-05 DIAGNOSIS — Z20822 Contact with and (suspected) exposure to covid-19: Secondary | ICD-10-CM | POA: Diagnosis not present

## 2022-01-05 DIAGNOSIS — Z01818 Encounter for other preprocedural examination: Secondary | ICD-10-CM

## 2022-01-05 DIAGNOSIS — Z888 Allergy status to other drugs, medicaments and biological substances status: Secondary | ICD-10-CM | POA: Diagnosis not present

## 2022-01-05 DIAGNOSIS — Z87891 Personal history of nicotine dependence: Secondary | ICD-10-CM | POA: Diagnosis not present

## 2022-01-05 DIAGNOSIS — J9811 Atelectasis: Secondary | ICD-10-CM | POA: Diagnosis not present

## 2022-01-05 LAB — CBC
HCT: 45.2 % (ref 36.0–46.0)
Hemoglobin: 15 g/dL (ref 12.0–15.0)
MCH: 29.5 pg (ref 26.0–34.0)
MCHC: 33.2 g/dL (ref 30.0–36.0)
MCV: 88.8 fL (ref 80.0–100.0)
Platelets: 230 10*3/uL (ref 150–400)
RBC: 5.09 MIL/uL (ref 3.87–5.11)
RDW: 12.3 % (ref 11.5–15.5)
WBC: 10.1 10*3/uL (ref 4.0–10.5)
nRBC: 0 % (ref 0.0–0.2)

## 2022-01-05 LAB — SURGICAL PCR SCREEN
MRSA, PCR: NEGATIVE
Staphylococcus aureus: NEGATIVE

## 2022-01-05 LAB — BLOOD GAS, ARTERIAL
Acid-Base Excess: 3.6 mmol/L — ABNORMAL HIGH (ref 0.0–2.0)
Bicarbonate: 26.7 mmol/L (ref 20.0–28.0)
Drawn by: 58793
O2 Saturation: 99.5 %
Patient temperature: 37
pCO2 arterial: 35 mmHg (ref 32–48)
pH, Arterial: 7.49 — ABNORMAL HIGH (ref 7.35–7.45)
pO2, Arterial: 121 mmHg — ABNORMAL HIGH (ref 83–108)

## 2022-01-05 LAB — PROTIME-INR
INR: 1 (ref 0.8–1.2)
Prothrombin Time: 13.2 seconds (ref 11.4–15.2)

## 2022-01-05 LAB — URINALYSIS, ROUTINE W REFLEX MICROSCOPIC
Bilirubin Urine: NEGATIVE
Glucose, UA: NEGATIVE mg/dL
Hgb urine dipstick: NEGATIVE
Ketones, ur: NEGATIVE mg/dL
Leukocytes,Ua: NEGATIVE
Nitrite: NEGATIVE
Protein, ur: NEGATIVE mg/dL
Specific Gravity, Urine: 1.02 (ref 1.005–1.030)
pH: 5 (ref 5.0–8.0)

## 2022-01-05 LAB — COMPREHENSIVE METABOLIC PANEL
ALT: 12 U/L (ref 0–44)
AST: 17 U/L (ref 15–41)
Albumin: 3.6 g/dL (ref 3.5–5.0)
Alkaline Phosphatase: 81 U/L (ref 38–126)
Anion gap: 13 (ref 5–15)
BUN: 18 mg/dL (ref 8–23)
CO2: 21 mmol/L — ABNORMAL LOW (ref 22–32)
Calcium: 9.5 mg/dL (ref 8.9–10.3)
Chloride: 106 mmol/L (ref 98–111)
Creatinine, Ser: 0.59 mg/dL (ref 0.44–1.00)
GFR, Estimated: >60 mL/min (ref >60)
Glucose, Bld: 111 mg/dL — ABNORMAL HIGH (ref 70–99)
Potassium: 3.9 mmol/L (ref 3.5–5.1)
Sodium: 140 mmol/L (ref 135–145)
Total Bilirubin: 0.5 mg/dL (ref 0.3–1.2)
Total Protein: 6.3 g/dL — ABNORMAL LOW (ref 6.5–8.1)

## 2022-01-05 LAB — TYPE AND SCREEN
ABO/RH(D): O POS
Antibody Screen: NEGATIVE

## 2022-01-05 LAB — APTT: aPTT: 30 seconds (ref 24–36)

## 2022-01-05 NOTE — Progress Notes (Signed)
PCP - Dr. Elsie Stain Cardiologist - denies  PPM/ICD - n/a  Chest x-ray - 01/05/22 EKG - 01/05/22 Stress Test - denies ECHO - denies Cardiac Cath - denies  Sleep Study - denies CPAP - denies  Blood Thinner Instructions: n/a Aspirin Instructions: n/a  NPO  COVID TEST- 01/05/22, done in PAT   Anesthesia review: No  Patient denies shortness of breath, fever, cough and chest pain at PAT appointment   All instructions explained to the patient, with a verbal understanding of the material. Patient agrees to go over the instructions while at home for a better understanding. Patient also instructed to self quarantine after being tested for COVID-19. The opportunity to ask questions was provided.

## 2022-01-06 LAB — SARS CORONAVIRUS 2 (TAT 6-24 HRS): SARS Coronavirus 2: NEGATIVE

## 2022-01-06 NOTE — Anesthesia Preprocedure Evaluation (Signed)
Anesthesia Evaluation  Patient identified by MRN, date of birth, ID band Patient awake    Reviewed: Allergy & Precautions, NPO status , Patient's Chart, lab work & pertinent test results  Airway Mallampati: II  TM Distance: >3 FB Neck ROM: Full    Dental no notable dental hx.    Pulmonary former smoker,  nodules   Pulmonary exam normal        Cardiovascular + CAD   Rhythm:Regular Rate:Normal     Neuro/Psych  Headaches, negative psych ROS   GI/Hepatic negative GI ROS, Neg liver ROS,   Endo/Other  negative endocrine ROS  Renal/GU negative Renal ROS  negative genitourinary   Musculoskeletal negative musculoskeletal ROS (+)   Abdominal Normal abdominal exam  (+)   Peds  Hematology negative hematology ROS (+)   Anesthesia Other Findings   Reproductive/Obstetrics                            Anesthesia Physical Anesthesia Plan  ASA: 3  Anesthesia Plan: General   Post-op Pain Management:    Induction: Intravenous  PONV Risk Score and Plan: 3 and Ondansetron, Dexamethasone and Treatment may vary due to age or medical condition  Airway Management Planned: Mask and Double Lumen EBT  Additional Equipment: ClearSight  Intra-op Plan:   Post-operative Plan: Extubation in OR  Informed Consent: I have reviewed the patients History and Physical, chart, labs and discussed the procedure including the risks, benefits and alternatives for the proposed anesthesia with the patient or authorized representative who has indicated his/her understanding and acceptance.     Dental advisory given  Plan Discussed with: CRNA  Anesthesia Plan Comments: (VIVISIGHT  Lab Results      Component                Value               Date                      WBC                      10.1                01/05/2022                HGB                      15.0                01/05/2022                HCT                       45.2                01/05/2022                MCV                      88.8                01/05/2022                PLT                      230  01/05/2022           Lab Results      Component                Value               Date                      NA                       140                 01/05/2022                K                        3.9                 01/05/2022                CO2                      21 (L)              01/05/2022                GLUCOSE                  111 (H)             01/05/2022                BUN                      18                  01/05/2022                CREATININE               0.59                01/05/2022                CALCIUM                  9.5                 01/05/2022                GFRNONAA                 >60                 01/05/2022          )       Anesthesia Quick Evaluation

## 2022-01-07 ENCOUNTER — Other Ambulatory Visit: Payer: Self-pay

## 2022-01-07 ENCOUNTER — Inpatient Hospital Stay (HOSPITAL_COMMUNITY): Payer: Medicare HMO

## 2022-01-07 ENCOUNTER — Inpatient Hospital Stay (HOSPITAL_COMMUNITY)
Admission: RE | Admit: 2022-01-07 | Discharge: 2022-01-12 | DRG: 164 | Disposition: A | Discharge status: Home / Self Care | Payer: Medicare HMO | Source: Ambulatory Visit | Attending: Thoracic Surgery (Cardiothoracic Vascular Surgery) | Admitting: Thoracic Surgery (Cardiothoracic Vascular Surgery)

## 2022-01-07 ENCOUNTER — Inpatient Hospital Stay (HOSPITAL_COMMUNITY): Payer: Medicare HMO | Admitting: Anesthesiology

## 2022-01-07 ENCOUNTER — Ambulatory Visit: Payer: Medicare HMO | Admitting: Emergency Medicine

## 2022-01-07 ENCOUNTER — Encounter (HOSPITAL_COMMUNITY): Payer: Self-pay | Admitting: Thoracic Surgery (Cardiothoracic Vascular Surgery)

## 2022-01-07 ENCOUNTER — Inpatient Hospital Stay (HOSPITAL_COMMUNITY): Payer: Medicare HMO | Admitting: Emergency Medicine

## 2022-01-07 ENCOUNTER — Encounter (HOSPITAL_COMMUNITY)
Admission: RE | Disposition: A | Discharge status: Home / Self Care | Payer: Self-pay | Source: Ambulatory Visit | Attending: Thoracic Surgery (Cardiothoracic Vascular Surgery)

## 2022-01-07 DIAGNOSIS — D72829 Elevated white blood cell count, unspecified: Secondary | ICD-10-CM | POA: Diagnosis not present

## 2022-01-07 DIAGNOSIS — R911 Solitary pulmonary nodule: Secondary | ICD-10-CM

## 2022-01-07 DIAGNOSIS — Z881 Allergy status to other antibiotic agents status: Secondary | ICD-10-CM | POA: Diagnosis not present

## 2022-01-07 DIAGNOSIS — J939 Pneumothorax, unspecified: Secondary | ICD-10-CM | POA: Diagnosis not present

## 2022-01-07 DIAGNOSIS — J9382 Other air leak: Secondary | ICD-10-CM | POA: Diagnosis not present

## 2022-01-07 DIAGNOSIS — Z888 Allergy status to other drugs, medicaments and biological substances status: Secondary | ICD-10-CM

## 2022-01-07 DIAGNOSIS — Z79899 Other long term (current) drug therapy: Secondary | ICD-10-CM | POA: Diagnosis not present

## 2022-01-07 DIAGNOSIS — I2510 Atherosclerotic heart disease of native coronary artery without angina pectoris: Secondary | ICD-10-CM | POA: Diagnosis not present

## 2022-01-07 DIAGNOSIS — E785 Hyperlipidemia, unspecified: Secondary | ICD-10-CM | POA: Diagnosis present

## 2022-01-07 DIAGNOSIS — Z85828 Personal history of other malignant neoplasm of skin: Secondary | ICD-10-CM

## 2022-01-07 DIAGNOSIS — Z20822 Contact with and (suspected) exposure to covid-19: Secondary | ICD-10-CM | POA: Diagnosis not present

## 2022-01-07 DIAGNOSIS — Z87891 Personal history of nicotine dependence: Secondary | ICD-10-CM

## 2022-01-07 DIAGNOSIS — D649 Anemia, unspecified: Secondary | ICD-10-CM | POA: Diagnosis not present

## 2022-01-07 DIAGNOSIS — R0602 Shortness of breath: Secondary | ICD-10-CM | POA: Diagnosis not present

## 2022-01-07 DIAGNOSIS — J984 Other disorders of lung: Secondary | ICD-10-CM | POA: Diagnosis not present

## 2022-01-07 DIAGNOSIS — Z885 Allergy status to narcotic agent status: Secondary | ICD-10-CM | POA: Diagnosis not present

## 2022-01-07 DIAGNOSIS — J90 Pleural effusion, not elsewhere classified: Secondary | ICD-10-CM | POA: Diagnosis not present

## 2022-01-07 DIAGNOSIS — J9811 Atelectasis: Secondary | ICD-10-CM | POA: Diagnosis not present

## 2022-01-07 DIAGNOSIS — C3490 Malignant neoplasm of unspecified part of unspecified bronchus or lung: Principal | ICD-10-CM | POA: Diagnosis present

## 2022-01-07 DIAGNOSIS — C3411 Malignant neoplasm of upper lobe, right bronchus or lung: Secondary | ICD-10-CM | POA: Diagnosis not present

## 2022-01-07 DIAGNOSIS — R110 Nausea: Secondary | ICD-10-CM | POA: Diagnosis not present

## 2022-01-07 DIAGNOSIS — J982 Interstitial emphysema: Secondary | ICD-10-CM | POA: Diagnosis not present

## 2022-01-07 HISTORY — PX: NODE DISSECTION: SHX5269

## 2022-01-07 HISTORY — PX: INTERCOSTAL NERVE BLOCK: SHX5021

## 2022-01-07 SURGERY — WEDGE RESECTION, LUNG, ROBOT-ASSISTED, THORACOSCOPIC
Anesthesia: General | Site: Chest | Laterality: Right

## 2022-01-07 MED ORDER — ONDANSETRON HCL 4 MG/2ML IJ SOLN
INTRAMUSCULAR | Status: DC | PRN
  Administered 2022-01-07: 4 mg via INTRAVENOUS

## 2022-01-07 MED ORDER — GLYCOPYRROLATE PF 0.2 MG/ML IJ SOSY
PREFILLED_SYRINGE | INTRAMUSCULAR | Status: DC | PRN
  Administered 2022-01-07: .2 mg via INTRAVENOUS

## 2022-01-07 MED ORDER — PHENYLEPHRINE 80 MCG/ML (10ML) SYRINGE FOR IV PUSH (FOR BLOOD PRESSURE SUPPORT)
PREFILLED_SYRINGE | INTRAVENOUS | Status: DC | PRN
  Administered 2022-01-07 (×2): 80 ug via INTRAVENOUS
  Administered 2022-01-07: 160 ug via INTRAVENOUS

## 2022-01-07 MED ORDER — PROPOFOL 10 MG/ML IV BOLUS
INTRAVENOUS | Status: AC
  Filled 2022-01-07: qty 20

## 2022-01-07 MED ORDER — VANCOMYCIN HCL IN DEXTROSE 1-5 GM/200ML-% IV SOLN
1000.0000 mg | INTRAVENOUS | Status: AC
  Administered 2022-01-07: 1000 mg via INTRAVENOUS
  Filled 2022-01-07: qty 200

## 2022-01-07 MED ORDER — ONDANSETRON HCL 4 MG/2ML IJ SOLN
INTRAMUSCULAR | Status: AC
Start: 2022-01-07 — End: ?
  Filled 2022-01-07: qty 2

## 2022-01-07 MED ORDER — ATORVASTATIN CALCIUM 10 MG PO TABS
10.0000 mg | ORAL_TABLET | Freq: Every day | ORAL | Status: DC
  Administered 2022-01-08 – 2022-01-12 (×5): 10 mg via ORAL
  Filled 2022-01-07 (×5): qty 1

## 2022-01-07 MED ORDER — FENTANYL CITRATE (PF) 250 MCG/5ML IJ SOLN
INTRAMUSCULAR | Status: AC
  Filled 2022-01-07: qty 5

## 2022-01-07 MED ORDER — LACTATED RINGERS IV SOLN: INTRAVENOUS | Status: DC | PRN

## 2022-01-07 MED ORDER — PROPOFOL 10 MG/ML IV BOLUS
INTRAVENOUS | Status: DC | PRN
  Administered 2022-01-07: 140 mg via INTRAVENOUS
  Administered 2022-01-07: 60 mg via INTRAVENOUS

## 2022-01-07 MED ORDER — PHENYLEPHRINE HCL-NACL 20-0.9 MG/250ML-% IV SOLN
INTRAVENOUS | Status: AC
Start: 2022-01-07 — End: ?
  Filled 2022-01-07: qty 500

## 2022-01-07 MED ORDER — MAGNESIUM 250 MG PO TABS: 250.0000 mg | ORAL_TABLET | Freq: Every day | ORAL | Status: DC

## 2022-01-07 MED ORDER — ONDANSETRON HCL 4 MG/2ML IJ SOLN
4.0000 mg | Freq: Four times a day (QID) | INTRAMUSCULAR | Status: DC | PRN
  Administered 2022-01-09 – 2022-01-11 (×4): 4 mg via INTRAVENOUS
  Filled 2022-01-07 (×4): qty 2

## 2022-01-07 MED ORDER — PROPOFOL 10 MG/ML IV BOLUS
INTRAVENOUS | Status: AC
Start: 2022-01-07 — End: ?
  Filled 2022-01-07: qty 20

## 2022-01-07 MED ORDER — MORPHINE SULFATE (PF) 2 MG/ML IV SOLN
2.0000 mg | INTRAVENOUS | Status: DC | PRN
  Administered 2022-01-08 (×2): 2 mg via INTRAVENOUS
  Filled 2022-01-07 (×2): qty 1

## 2022-01-07 MED ORDER — BUPIVACAINE LIPOSOME 1.3 % IJ SUSP
INTRAMUSCULAR | Status: AC
  Filled 2022-01-07: qty 20

## 2022-01-07 MED ORDER — ARTIFICIAL TEARS OPHTHALMIC OINT
TOPICAL_OINTMENT | OPHTHALMIC | Status: AC
  Filled 2022-01-07: qty 3.5

## 2022-01-07 MED ORDER — LIDOCAINE 2% (20 MG/ML) 5 ML SYRINGE
INTRAMUSCULAR | Status: DC | PRN
  Administered 2022-01-07: 40 mg via INTRAVENOUS

## 2022-01-07 MED ORDER — ROCURONIUM BROMIDE 10 MG/ML (PF) SYRINGE
PREFILLED_SYRINGE | INTRAVENOUS | Status: AC
Start: 2022-01-07 — End: ?
  Filled 2022-01-07: qty 10

## 2022-01-07 MED ORDER — VASOPRESSIN 20 UNIT/ML IV SOLN
INTRAVENOUS | Status: AC
  Filled 2022-01-07: qty 1

## 2022-01-07 MED ORDER — SODIUM CHLORIDE FLUSH 0.9 % IV SOLN
INTRAVENOUS | Status: DC | PRN
  Administered 2022-01-07: 100 mL

## 2022-01-07 MED ORDER — SUGAMMADEX SODIUM 200 MG/2ML IV SOLN
INTRAVENOUS | Status: DC | PRN
  Administered 2022-01-07: 200 mg via INTRAVENOUS

## 2022-01-07 MED ORDER — GLYCOPYRROLATE PF 0.2 MG/ML IJ SOSY
PREFILLED_SYRINGE | INTRAMUSCULAR | Status: AC
  Filled 2022-01-07: qty 1

## 2022-01-07 MED ORDER — 0.9 % SODIUM CHLORIDE (POUR BTL) OPTIME
TOPICAL | Status: DC | PRN
  Administered 2022-01-07: 2000 mL

## 2022-01-07 MED ORDER — DEXAMETHASONE SODIUM PHOSPHATE 10 MG/ML IJ SOLN
INTRAMUSCULAR | Status: AC
  Filled 2022-01-07: qty 1

## 2022-01-07 MED ORDER — FENTANYL CITRATE (PF) 250 MCG/5ML IJ SOLN
INTRAMUSCULAR | Status: DC | PRN
  Administered 2022-01-07 (×2): 50 ug via INTRAVENOUS
  Administered 2022-01-07: 100 ug via INTRAVENOUS
  Administered 2022-01-07: 50 ug via INTRAVENOUS

## 2022-01-07 MED ORDER — ENOXAPARIN SODIUM 40 MG/0.4ML IJ SOSY
40.0000 mg | PREFILLED_SYRINGE | Freq: Every day | INTRAMUSCULAR | Status: DC
Start: 2022-01-08 — End: 2022-01-11
  Administered 2022-01-08 – 2022-01-10 (×3): 40 mg via SUBCUTANEOUS
  Filled 2022-01-07 (×3): qty 0.4

## 2022-01-07 MED ORDER — BISACODYL 5 MG PO TBEC
10.0000 mg | DELAYED_RELEASE_TABLET | Freq: Every day | ORAL | Status: DC
  Administered 2022-01-08 – 2022-01-09 (×2): 10 mg via ORAL
  Filled 2022-01-07 (×4): qty 2

## 2022-01-07 MED ORDER — LORATADINE 10 MG PO TABS
10.0000 mg | ORAL_TABLET | Freq: Every day | ORAL | Status: DC
  Administered 2022-01-08 – 2022-01-12 (×5): 10 mg via ORAL
  Filled 2022-01-07 (×5): qty 1

## 2022-01-07 MED ORDER — ORAL CARE MOUTH RINSE: 15.0000 mL | OROMUCOSAL | Status: DC | PRN

## 2022-01-07 MED ORDER — FENTANYL CITRATE (PF) 100 MCG/2ML IJ SOLN
INTRAMUSCULAR | Status: AC
  Filled 2022-01-07: qty 2

## 2022-01-07 MED ORDER — EPHEDRINE SULFATE-NACL 50-0.9 MG/10ML-% IV SOSY
PREFILLED_SYRINGE | INTRAVENOUS | Status: DC | PRN
  Administered 2022-01-07 (×2): 10 mg via INTRAVENOUS

## 2022-01-07 MED ORDER — LACTATED RINGERS IV SOLN: INTRAVENOUS | Status: DC

## 2022-01-07 MED ORDER — ACETAMINOPHEN 10 MG/ML IV SOLN: 1000.0000 mg | Freq: Once | INTRAVENOUS | Status: DC | PRN

## 2022-01-07 MED ORDER — BUPIVACAINE HCL (PF) 0.5 % IJ SOLN
INTRAMUSCULAR | Status: AC
  Filled 2022-01-07: qty 30

## 2022-01-07 MED ORDER — ACETAMINOPHEN 500 MG PO TABS
1000.0000 mg | ORAL_TABLET | Freq: Four times a day (QID) | ORAL | Status: DC
  Administered 2022-01-07 – 2022-01-12 (×17): 1000 mg via ORAL
  Filled 2022-01-07 (×17): qty 2

## 2022-01-07 MED ORDER — CHLORHEXIDINE GLUCONATE 0.12 % MT SOLN
15.0000 mL | Freq: Once | OROMUCOSAL | Status: AC
  Administered 2022-01-07: 15 mL via OROMUCOSAL
  Filled 2022-01-07: qty 15

## 2022-01-07 MED ORDER — MELATONIN 3 MG PO TABS
3.0000 mg | ORAL_TABLET | Freq: Every evening | ORAL | Status: DC | PRN
Start: 2022-01-07 — End: 2022-01-12
  Administered 2022-01-07 – 2022-01-11 (×4): 3 mg via ORAL
  Filled 2022-01-07 (×4): qty 1

## 2022-01-07 MED ORDER — PHENYLEPHRINE 80 MCG/ML (10ML) SYRINGE FOR IV PUSH (FOR BLOOD PRESSURE SUPPORT)
PREFILLED_SYRINGE | INTRAVENOUS | Status: AC
  Filled 2022-01-07: qty 10

## 2022-01-07 MED ORDER — ACETAMINOPHEN 160 MG/5ML PO SOLN: 1000.0000 mg | Freq: Four times a day (QID) | ORAL | Status: DC

## 2022-01-07 MED ORDER — KETOROLAC TROMETHAMINE 15 MG/ML IJ SOLN
15.0000 mg | Freq: Four times a day (QID) | INTRAMUSCULAR | Status: DC
  Administered 2022-01-07 – 2022-01-11 (×13): 15 mg via INTRAVENOUS
  Filled 2022-01-07 (×13): qty 1

## 2022-01-07 MED ORDER — MIDAZOLAM HCL 2 MG/2ML IJ SOLN
INTRAMUSCULAR | Status: DC | PRN
  Administered 2022-01-07: 1 mg via INTRAVENOUS

## 2022-01-07 MED ORDER — ROCURONIUM BROMIDE 10 MG/ML (PF) SYRINGE
PREFILLED_SYRINGE | INTRAVENOUS | Status: DC | PRN
  Administered 2022-01-07: 20 mg via INTRAVENOUS
  Administered 2022-01-07: 60 mg via INTRAVENOUS

## 2022-01-07 MED ORDER — ORAL CARE MOUTH RINSE: 15.0000 mL | Freq: Once | OROMUCOSAL | Status: AC

## 2022-01-07 MED ORDER — MAGNESIUM GLUCONATE 500 MG PO TABS
250.0000 mg | ORAL_TABLET | Freq: Every day | ORAL | Status: DC
  Administered 2022-01-08 – 2022-01-12 (×5): 250 mg via ORAL
  Filled 2022-01-07 (×5): qty 1

## 2022-01-07 MED ORDER — FENTANYL CITRATE (PF) 100 MCG/2ML IJ SOLN
25.0000 ug | INTRAMUSCULAR | Status: DC | PRN
  Administered 2022-01-07 (×4): 25 ug via INTRAVENOUS

## 2022-01-07 MED ORDER — PHENYLEPHRINE HCL-NACL 20-0.9 MG/250ML-% IV SOLN
INTRAVENOUS | Status: DC | PRN
  Administered 2022-01-07: 40 ug/min via INTRAVENOUS

## 2022-01-07 MED ORDER — TRAMADOL HCL 50 MG PO TABS
50.0000 mg | ORAL_TABLET | Freq: Four times a day (QID) | ORAL | Status: DC | PRN
  Administered 2022-01-07 – 2022-01-08 (×2): 50 mg via ORAL
  Filled 2022-01-07: qty 2
  Filled 2022-01-07: qty 1

## 2022-01-07 MED ORDER — SENNOSIDES-DOCUSATE SODIUM 8.6-50 MG PO TABS
1.0000 | ORAL_TABLET | Freq: Every day | ORAL | Status: DC
  Administered 2022-01-07 – 2022-01-09 (×3): 1 via ORAL
  Filled 2022-01-07 (×4): qty 1

## 2022-01-07 MED ORDER — LIDOCAINE 2% (20 MG/ML) 5 ML SYRINGE
INTRAMUSCULAR | Status: AC
  Filled 2022-01-07: qty 5

## 2022-01-07 MED ORDER — SODIUM CHLORIDE (PF) 0.9 % IJ SOLN
INTRAMUSCULAR | Status: AC
  Filled 2022-01-07: qty 10

## 2022-01-07 MED ORDER — MIDAZOLAM HCL 2 MG/2ML IJ SOLN
INTRAMUSCULAR | Status: AC
  Filled 2022-01-07: qty 2

## 2022-01-07 MED ORDER — DEXAMETHASONE SODIUM PHOSPHATE 10 MG/ML IJ SOLN
INTRAMUSCULAR | Status: DC | PRN
  Administered 2022-01-07: 10 mg via INTRAVENOUS

## 2022-01-07 SURGICAL SUPPLY — 104 items
BLADE CLIPPER SURG (BLADE) ×2 IMPLANT
CANISTER SUCT 3000ML PPV (MISCELLANEOUS) ×4 IMPLANT
CANNULA REDUC XI 12-8 STAPL (CANNULA) ×4
CANNULA REDUCER 12-8 DVNC XI (CANNULA) ×2 IMPLANT
CATH THORACIC 28FR (CATHETERS) ×1 IMPLANT
CHLORAPREP W/TINT 26 (MISCELLANEOUS) ×2 IMPLANT
CLIP VESOCCLUDE MED 6/CT (CLIP) IMPLANT
CNTNR URN SCR LID CUP LEK RST (MISCELLANEOUS) ×5 IMPLANT
CONT SPEC 4OZ STRL OR WHT (MISCELLANEOUS) ×16
DEFOGGER SCOPE WARMER CLEARIFY (MISCELLANEOUS) ×2 IMPLANT
DERMABOND ADVANCED (GAUZE/BANDAGES/DRESSINGS) ×1
DERMABOND ADVANCED .7 DNX12 (GAUZE/BANDAGES/DRESSINGS) ×1 IMPLANT
DRAIN CHANNEL 28F RND 3/8 FF (WOUND CARE) IMPLANT
DRAIN CHANNEL 32F RND 10.7 FF (WOUND CARE) IMPLANT
DRAPE ARM DVNC X/XI (DISPOSABLE) ×4 IMPLANT
DRAPE COLUMN DVNC XI (DISPOSABLE) ×1 IMPLANT
DRAPE CV SPLIT W-CLR ANES SCRN (DRAPES) ×2 IMPLANT
DRAPE DA VINCI XI ARM (DISPOSABLE) ×8
DRAPE DA VINCI XI COLUMN (DISPOSABLE) ×2
DRAPE HALF SHEET 40X57 (DRAPES) ×2 IMPLANT
DRAPE ORTHO SPLIT 77X108 STRL (DRAPES) ×2
DRAPE SURG ORHT 6 SPLT 77X108 (DRAPES) ×1 IMPLANT
ELECT BLADE 6.5 EXT (BLADE) IMPLANT
ELECT REM PT RETURN 9FT ADLT (ELECTROSURGICAL) ×2
ELECTRODE REM PT RTRN 9FT ADLT (ELECTROSURGICAL) ×1 IMPLANT
GAUZE 4X4 16PLY ~~LOC~~+RFID DBL (SPONGE) ×1 IMPLANT
GAUZE KITTNER 4X5 RF (MISCELLANEOUS) ×3 IMPLANT
GAUZE SPONGE 4X4 12PLY STRL (GAUZE/BANDAGES/DRESSINGS) ×2 IMPLANT
GLOVE BIO SURGEON STRL SZ7.5 (GLOVE) ×4 IMPLANT
GOWN STRL REUS W/ TWL LRG LVL3 (GOWN DISPOSABLE) ×2 IMPLANT
GOWN STRL REUS W/ TWL XL LVL3 (GOWN DISPOSABLE) ×3 IMPLANT
GOWN STRL REUS W/TWL 2XL LVL3 (GOWN DISPOSABLE) ×2 IMPLANT
GOWN STRL REUS W/TWL LRG LVL3 (GOWN DISPOSABLE) ×6
GOWN STRL REUS W/TWL XL LVL3 (GOWN DISPOSABLE) ×4
HEMOSTAT SURGICEL 2X14 (HEMOSTASIS) ×4 IMPLANT
KIT BASIN OR (CUSTOM PROCEDURE TRAY) ×2 IMPLANT
KIT SUCTION CATH 14FR (SUCTIONS) IMPLANT
KIT TURNOVER KIT B (KITS) ×2 IMPLANT
NDL HYPO 25GX1X1/2 BEV (NEEDLE) ×1 IMPLANT
NEEDLE 22X1 1/2 (OR ONLY) (NEEDLE) ×2 IMPLANT
NEEDLE HYPO 25GX1X1/2 BEV (NEEDLE) ×2 IMPLANT
NS IRRIG 1000ML POUR BTL (IV SOLUTION) ×5 IMPLANT
OBTURATOR OPTICAL STANDARD 8MM (TROCAR)
OBTURATOR OPTICAL STND 8 DVNC (TROCAR)
OBTURATOR OPTICALSTD 8 DVNC (TROCAR) IMPLANT
PACK CHEST (CUSTOM PROCEDURE TRAY) ×2 IMPLANT
PAD ARMBOARD 7.5X6 YLW CONV (MISCELLANEOUS) ×10 IMPLANT
PORT ACCESS TROCAR AIRSEAL 12 (TROCAR) ×1 IMPLANT
PORT ACCESS TROCAR AIRSEAL 5M (TROCAR) ×1
RELOAD STAPLE 45 2.5 WHT DVNC (STAPLE) IMPLANT
RELOAD STAPLE 45 3.5 BLU DVNC (STAPLE) IMPLANT
RELOAD STAPLE 45 4.3 GRN DVNC (STAPLE) IMPLANT
RELOAD STAPLER 2.5X45 WHT DVNC (STAPLE) ×2 IMPLANT
RELOAD STAPLER 3.5X45 BLU DVNC (STAPLE) ×9 IMPLANT
RELOAD STAPLER 4.3X45 GRN DVNC (STAPLE) ×1 IMPLANT
SEAL CANN UNIV 5-8 DVNC XI (MISCELLANEOUS) ×2 IMPLANT
SEAL XI 5MM-8MM UNIVERSAL (MISCELLANEOUS) ×4
SET TRI-LUMEN FLTR TB AIRSEAL (TUBING) ×2 IMPLANT
SOLUTION ELECTROLUBE (MISCELLANEOUS) IMPLANT
SPONGE INTESTINAL PEANUT (DISPOSABLE) IMPLANT
SPONGE T-LAP 18X18 ~~LOC~~+RFID (SPONGE) ×6 IMPLANT
STAPLER 45 SUREFORM CVD (STAPLE) ×2
STAPLER 45 SUREFORM CVD DVNC (STAPLE) IMPLANT
STAPLER CANNULA SEAL DVNC XI (STAPLE) ×2 IMPLANT
STAPLER CANNULA SEAL XI (STAPLE) ×4
STAPLER RELOAD 2.5X45 WHITE (STAPLE) ×4
STAPLER RELOAD 2.5X45 WHT DVNC (STAPLE) ×2
STAPLER RELOAD 3.5X45 BLU DVNC (STAPLE) ×9
STAPLER RELOAD 3.5X45 BLUE (STAPLE) ×18
STAPLER RELOAD 4.3X45 GREEN (STAPLE) ×2
STAPLER RELOAD 4.3X45 GRN DVNC (STAPLE) ×1
STOPCOCK 4 WAY LG BORE MALE ST (IV SETS) ×2 IMPLANT
SUT MON AB 2-0 CT1 36 (SUTURE) IMPLANT
SUT PDS AB 1 CTX 36 (SUTURE) IMPLANT
SUT PROLENE 4 0 RB 1 (SUTURE)
SUT PROLENE 4-0 RB1 .5 CRCL 36 (SUTURE) IMPLANT
SUT SILK  1 MH (SUTURE) ×2
SUT SILK 1 MH (SUTURE) ×1 IMPLANT
SUT SILK 1 TIES 10X30 (SUTURE) IMPLANT
SUT SILK 2 0 SH (SUTURE) IMPLANT
SUT SILK 2 0SH CR/8 30 (SUTURE) IMPLANT
SUT VIC AB 1 CTX 36 (SUTURE) ×2
SUT VIC AB 1 CTX36XBRD ANBCTR (SUTURE) IMPLANT
SUT VIC AB 2-0 CT1 27 (SUTURE) ×2
SUT VIC AB 2-0 CT1 TAPERPNT 27 (SUTURE) ×1 IMPLANT
SUT VIC AB 3-0 SH 27 (SUTURE) ×4
SUT VIC AB 3-0 SH 27X BRD (SUTURE) ×3 IMPLANT
SUT VICRYL 0 TIES 12 18 (SUTURE) ×2 IMPLANT
SUT VICRYL 0 UR6 27IN ABS (SUTURE) ×4 IMPLANT
SUT VICRYL 2 TP 1 (SUTURE) IMPLANT
SYR 10ML LL (SYRINGE) ×2 IMPLANT
SYR 20ML LL LF (SYRINGE) ×2 IMPLANT
SYR 50ML LL SCALE MARK (SYRINGE) ×2 IMPLANT
SYSTEM RETRIEVAL ANCHOR 15 (MISCELLANEOUS) ×1 IMPLANT
SYSTEM RETRIEVAL ANCHOR 8 (MISCELLANEOUS) ×1 IMPLANT
SYSTEM SAHARA CHEST DRAIN ATS (WOUND CARE) ×2 IMPLANT
TAPE CLOTH 4X10 WHT NS (GAUZE/BANDAGES/DRESSINGS) ×2 IMPLANT
TAPE CLOTH SURG 4X10 WHT LF (GAUZE/BANDAGES/DRESSINGS) ×1 IMPLANT
TIP APPLICATOR SPRAY EXTEND 16 (VASCULAR PRODUCTS) IMPLANT
TOWEL GREEN STERILE (TOWEL DISPOSABLE) ×2 IMPLANT
TRAY FOLEY MTR SLVR 16FR STAT (SET/KITS/TRAYS/PACK) ×2 IMPLANT
TROCAR BLADELESS 15MM (ENDOMECHANICALS) IMPLANT
TUBING EXTENTION W/L.L. (IV SETS) ×2 IMPLANT
WATER STERILE IRR 1000ML POUR (IV SOLUTION) ×2 IMPLANT

## 2022-01-07 NOTE — Brief Op Note (Signed)
01/07/2022  12:10 PM  PATIENT:  Desiree Ortega  76 y.o. female  PRE-OPERATIVE DIAGNOSIS:  Pulmonary nodule  POST-OPERATIVE DIAGNOSIS: Right upper lobe adenocarcinoma  PROCEDURE:   XI ROBOTIC ASSISTED THORASCOPY-Right upper lobe WEDGE RESECTION, RIGHT UPPER LOBECTOMY  INTERCOSTAL NERVE BLOCK (Right)  NODE DISSECTION (Right)  SURGEON:  Lajuana Matte, MD - Primary  PHYSICIAN ASSISTANT: Stehler                                            Desiree Ortega  ASSISTANTS: Cruz, Abbygail U, RN, Scrub Person  ANESTHESIA:   general  EBL:  100 mL   BLOOD ADMINISTERED: none  DRAINS:  49fr right pleural Blake    LOCAL MEDICATIONS USED:  Exparel local and intercostal  SPECIMEN:  Source of Specimen:  Right upper lung lobe with multiple lymph nodes  DISPOSITION OF SPECIMEN:  PATHOLOGY  COUNTS:  Correct  DICTATION: .Dragon Dictation  PLAN OF CARE: Admit to inpatient   PATIENT DISPOSITION:  PACU - hemodynamically stable.   Delay start of Pharmacological VTE agent (>24hrs) due to surgical blood loss or risk of bleeding: no

## 2022-01-07 NOTE — Hospital Course (Addendum)
Referring: Collene Gobble, MD Primary Care: Tonia Ghent, MD  History of Present Illness:    Desiree Ortega 76 y.o. female referred by Dr. Lamonte Sakai for surgical evaluation of a 2.4 cm right upper lobe pulmonary nodule. This was found incidentally while she was being evaluated for spinal issues.  She is a former smoker but quit in 2003.  She admits to a 20 pound weight loss.  She denies any shortness of breath or coughs.  She has had some neuropathic radicular pain, but this is all related to her spinal issues. The lung lesion has minimal uptake on PET/CT. We discussed several options for obtaining a tissue diagnosis, and the patient is elected to proceed directly with a surgical biopsy.  If this is positive for primary lung cancer we will then proceed with robotic assisted right upper lobectomy.  The risks and benefits have been discussed.  Hospital Course: [name] was admitted for elective surgery on 01/07/22 and taken to the OR where robotic-assisted right lower lobe wedge biopsy was accomplished. This was determined to be adenocarcinoma on frozen section. We proceeded with right upper lobectomy and lymph node dissection. Following the procedure, she was extubated and recovered in the PACU then transferred to [facility]. Chest tube remained to water seal. There initially was a small air leak with cough but this did resolve. Daily chest x rays were obtained and remained stable. She had anemia post op and was started on oral iron. Last H and H was **. Chest tube was removed on 07/23. Follow up chest x ray showed **. She has been tolerating a diet and had a bowel movement. She is ambulating on room air. All wounds are clean, dry, healing without signs of infection. She is felt surgically stable for discharge today.

## 2022-01-07 NOTE — Transfer of Care (Signed)
Immediate Anesthesia Transfer of Care Note  Patient: Desiree Ortega  Procedure(s) Performed: XI ROBOTIC ASSISTED THORASCOPY-Right upper lobe WEDGE RESECTION, RIGHT UPPER LOBECTOMY (Right: Chest) INTERCOSTAL NERVE BLOCK (Right: Chest) NODE DISSECTION (Right: Chest)  Patient Location: PACU  Anesthesia Type:General  Level of Consciousness: awake, alert , oriented and patient cooperative  Airway & Oxygen Therapy: Patient Spontanous Breathing  Post-op Assessment: Report given to RN, Post -op Vital signs reviewed and stable and Patient moving all extremities X 4  Post vital signs: Reviewed and stable  Last Vitals:  Vitals Value Taken Time  BP 162/84 01/07/22 1218  Temp    Pulse 74 01/07/22 1222  Resp 15 01/07/22 1222  SpO2 94 % 01/07/22 1222  Vitals shown include unvalidated device data.  Last Pain:  Vitals:   01/07/22 0724  TempSrc:   PainSc: 0-No pain         Complications: No notable events documented.

## 2022-01-07 NOTE — Interval H&P Note (Signed)
History and Physical Interval Note:  01/07/2022 8:35 AM  Desiree Ortega  has presented today for surgery, with the diagnosis of Pulmonary nodule.  The various methods of treatment have been discussed with the patient and family. After consideration of risks, benefits and other options for treatment, the patient has consented to  Procedure(s): XI ROBOTIC ASSISTED THORASCOPY-Right upper lobe WEDGE RESECTION, possible lobectomy (Right) as a surgical intervention.  The patient's history has been reviewed, patient examined, no change in status, stable for surgery.  I have reviewed the patient's chart and labs.  Questions were answered to the patient's satisfaction.     [name] Bary Leriche

## 2022-01-07 NOTE — Op Note (Signed)
      [address]       [place] [address]             [phone]        01/07/2022  Patient:  Desiree Ortega Pre-Op Dx: Right upper lobe pulmonary nodule   Post-op Dx:  Right upper lobe NSCLC Procedure: - Robotic assisted right video thoracoscopy - Right upper lobe wedge resection - Right upper lobectomy - Mediastinal lymph node sampling - Intercostal nerve block  Surgeon and Role:      * Rudolfo Brandow, Lucile Crater, MD - Primary  Assistant: Macarthur Critchley, PA-C  An experienced assistant was required given the complexity of this surgery and the standard of surgical care. The assistant was needed for exposure, dissection, suctioning, retraction of delicate tissues and sutures, instrument exchange and for overall help during this procedure.    Anesthesia  general EBL:  135ml Blood Administration: none Specimen:  right upper lobe wedge, right upper lobe, hilar and mediastinal lymph nodes  Drains: 28 F argyle chest tube in right chest Counts: correct   Indications: 76 year old female with a 2.4 cm right upper lobe pulmonary nodule with minimal uptake on PET/CT.  We discussed several options for obtaining a tissue diagnosis, and the patient is elected to proceed directly with a surgical biopsy.  If this is positive for primary lung cancer we will then proceed with robotic assisted right upper lobectomy.  The risks and benefits have been discussed.  Findings: Wedge resection was positive for adenocarcinoma.  Normal anatomy  Operative Technique: After the risks, benefits and alternatives were thoroughly discussed, the patient was brought to the operative theatre.  Anesthesia was induced, and the patient was then placed in a left lateral decubitus position and was prepped and draped in normal sterile fashion.  An appropriate surgical pause was performed, and pre-operative antibiotics were dosed accordingly.  We began by placing our 4 robotic ports in the the 7th  intercostal space targeting the hilum of the lung.  A 15mm assistant port was placed in the 9th intercostal space in the anterior axillary line.  The robot was then docked and all instruments were passed under direct visualization.    The lung was then retracted superiorly, and the inferior pulmonary ligament was divided.  The hilum was mobilized anteriorly and posteriorly.  We identified the upper lobe vein, and after careful isolation, it was divided with a vascular stapler.  We next moved to the  pulmonary artery.  The artery was then divided with a vascular load stapler.  The bronchus to the upper lobe was then isolated.  After a test clamp, with good ventilation of the middle and lower, the bronchus was then divided.  The fissure was completed, and the specimen was passed into an endocatch bag.  It was removed from the anterior access site.    Lymph nodes were then sampled at levels 4,7,9, and hilum.  The chest was irrigated, and an air leak test was performed.  An intercostal nerve block was performed under direct visualization.  A 28 F chest tube was then placed, and we watch the remaining lobes re-expand.  The skin and soft tissue were closed with absorbable suture    The patient tolerated the procedure without any immediate complications, and was transferred to the PACU in stable condition.  Jamicheal Heard Bary Leriche

## 2022-01-07 NOTE — Anesthesia Postprocedure Evaluation (Signed)
Anesthesia Post Note  Patient: Desiree Ortega  Procedure(s) Performed: XI ROBOTIC ASSISTED THORASCOPY-Right upper lobe WEDGE RESECTION, RIGHT UPPER LOBECTOMY (Right: Chest) INTERCOSTAL NERVE BLOCK (Right: Chest) NODE DISSECTION (Right: Chest)     Patient location during evaluation: PACU Anesthesia Type: General Level of consciousness: awake and alert Pain management: pain level controlled Vital Signs Assessment: post-procedure vital signs reviewed and stable Respiratory status: spontaneous breathing, nonlabored ventilation, respiratory function stable and patient connected to nasal cannula oxygen Cardiovascular status: blood pressure returned to baseline and stable Postop Assessment: no apparent nausea or vomiting Anesthetic complications: no   No notable events documented.  Last Vitals:  Vitals:   01/07/22 1320 01/07/22 1420  BP: (!) 100/49 (!) 103/46  Pulse: 67 67  Resp: (!) 21 18  Temp:    SpO2: 93% 97%    Last Pain:  Vitals:   01/07/22 1420  TempSrc:   PainSc: Asleep                 Belenda Cruise P Tacoya Altizer

## 2022-01-07 NOTE — Anesthesia Procedure Notes (Addendum)
Procedure Name: Intubation Date/Time: 01/07/2022 8:57 AM  Performed by: Betha Loa, CRNAPre-anesthesia Checklist: Patient identified, Emergency Drugs available, Suction available and Patient being monitored Patient Re-evaluated:Patient Re-evaluated prior to induction Oxygen Delivery Method: Circle system utilized Preoxygenation: Pre-oxygenation with 100% oxygen Induction Type: IV induction Ventilation: Mask ventilation without difficulty Laryngoscope Size: Mac and 3 Grade View: Grade I Endobronchial tube: Left, Double lumen EBT, EBT position confirmed by auscultation and EBT position confirmed by fiberoptic bronchoscope and 37 Fr Number of attempts: 2 (1st attempt by SRNA, G1 view--esophageal intubation; 2nd attempt per CRNA, G1 view atraumatic intubation) Airway Equipment and Method: Stylet, Oral airway and Bite block Placement Confirmation: ETT inserted through vocal cords under direct vision, positive ETCO2 and breath sounds checked- equal and bilateral Secured at: 30 cm Tube secured with: Tape Dental Injury: Teeth and Oropharynx as per pre-operative assessment  Comments: Attempt #1 by SRNA, Attempt #2 by CRNA, Grade 1 view for both attempts.

## 2022-01-08 ENCOUNTER — Encounter (HOSPITAL_COMMUNITY): Payer: Self-pay | Admitting: Thoracic Surgery (Cardiothoracic Vascular Surgery)

## 2022-01-08 ENCOUNTER — Inpatient Hospital Stay (HOSPITAL_COMMUNITY): Payer: Medicare HMO

## 2022-01-08 LAB — BASIC METABOLIC PANEL
Anion gap: 9 (ref 5–15)
BUN: 23 mg/dL (ref 8–23)
CO2: 24 mmol/L (ref 22–32)
Calcium: 8.7 mg/dL — ABNORMAL LOW (ref 8.9–10.3)
Chloride: 101 mmol/L (ref 98–111)
Creatinine, Ser: 0.85 mg/dL (ref 0.44–1.00)
GFR, Estimated: >60 mL/min (ref >60)
Glucose, Bld: 133 mg/dL — ABNORMAL HIGH (ref 70–99)
Potassium: 4.9 mmol/L (ref 3.5–5.1)
Sodium: 134 mmol/L — ABNORMAL LOW (ref 135–145)

## 2022-01-08 NOTE — TOC Progression Note (Signed)
Transition of Care [facility]) - Progression Note    Patient Details  Name: ANECIA NUSBAUM MRN: [id number] Date of Birth: 12-29-1945  Transition of Care [facility]) CM/SW Contact  Angelita Ingles, RN Phone Number:[phone]  01/08/2022, 12:24 PM  Clinical Narrative:    TOC following patient -POD#1 robotic-assisted right upper lobectomy for adenocarcinoma. Transition of Care Department [facility]) has reviewed patient and no TOC needs have been identified at this time. We will continue to monitor patient advancement through interdisciplinary progression rounds.        Expected Discharge Plan and Services                                                 Social Determinants of Health (SDOH) Interventions    Readmission Risk Interventions     No data to display

## 2022-01-08 NOTE — Progress Notes (Addendum)
      [address]       [place] [address]             [phone]      1 Day Post-Op Procedure(s) (LRB): XI ROBOTIC ASSISTED THORASCOPY-Right upper lobe WEDGE RESECTION, RIGHT UPPER LOBECTOMY (Right) INTERCOSTAL NERVE BLOCK (Right) NODE DISSECTION (Right) Subjective: Sitting up eating breakfast. Says pain is well controlled and says she feels she is breathing OK. No concerns.   Objective: Vital signs in last 24 hours: Temp:  [97.6 F (36.4 C)-98.5 F (36.9 C)] 98.3 F (36.8 C) (07/20 0332) Pulse Rate:  [66-78] 70 (07/20 0604) Cardiac Rhythm: Normal sinus rhythm (07/19 2000) Resp:  [17-25] 19 (07/20 0604) BP: (99-162)/(45-112) 112/83 (07/20 0604) SpO2:  [91 %-98 %] 91 % (07/20 0604)     Intake/Output from previous day: 07/19 0701 - 07/20 0700 In: 1400 [P.O.:300; I.V.:1100] Out: 1105 [Urine:825; Blood:100; Chest Tube:180] Intake/Output this shift: No intake/output data recorded.  General appearance: alert, cooperative, and no distress Neurologic: intact Heart: RRR Lungs: breath sounds full, clear. Small air leak noted while she was talking. Minimal DT drainage. CXR with small left apical space. Wound: the port sites and CT insertion site are dry.  Lab Results: Recent Labs    01/05/22 1414  WBC 10.1  HGB 15.0  HCT 45.2  PLT 230   BMET:  Recent Labs    01/05/22 1414  NA 140  K 3.9  CL 106  CO2 21*  GLUCOSE 111*  BUN 18  CREATININE 0.59  CALCIUM 9.5    PT/INR:  Recent Labs    01/05/22 1414  LABPROT 13.2  INR 1.0   ABG    Component Value Date/Time   PHART 7.49 (H) 01/05/2022 1410   HCO3 26.7 01/05/2022 1410   O2SAT 99.5 01/05/2022 1410   CBG (last 3)  No results for input(s): "GLUCAP" in the last 72 hours.  Assessment/Plan: S/P Procedure(s) (LRB): XI ROBOTIC ASSISTED THORASCOPY-Right upper lobe WEDGE RESECTION, RIGHT UPPER LOBECTOMY (Right) INTERCOSTAL NERVE BLOCK (Right) NODE DISSECTION (Right)  -POD1  robotic-assisted right upper lobectomy for adenocarcinoma. Stable respiratory status. Has minimal CT drainage but small air leak so will leave the CT to water seal. Mobilize. Work on Research scientist (medical).    LOS: 1 day    Antony Odea, [place] [phone] 01/08/2022  Agree with above IS, ambulation Rakeem Colley O Nini Cavan

## 2022-01-09 ENCOUNTER — Inpatient Hospital Stay (HOSPITAL_COMMUNITY): Payer: Medicare HMO

## 2022-01-09 LAB — CBC
HCT: 27.5 % — ABNORMAL LOW (ref 36.0–46.0)
Hemoglobin: 9.3 g/dL — ABNORMAL LOW (ref 12.0–15.0)
MCH: 29.7 pg (ref 26.0–34.0)
MCHC: 33.8 g/dL (ref 30.0–36.0)
MCV: 87.9 fL (ref 80.0–100.0)
Platelets: 170 10*3/uL (ref 150–400)
RBC: 3.13 MIL/uL — ABNORMAL LOW (ref 3.87–5.11)
RDW: 12.7 % (ref 11.5–15.5)
WBC: 16.3 10*3/uL — ABNORMAL HIGH (ref 4.0–10.5)
nRBC: 0 % (ref 0.0–0.2)

## 2022-01-09 LAB — COMPREHENSIVE METABOLIC PANEL
ALT: 14 U/L (ref 0–44)
AST: 21 U/L (ref 15–41)
Albumin: 2.4 g/dL — ABNORMAL LOW (ref 3.5–5.0)
Alkaline Phosphatase: 51 U/L (ref 38–126)
Anion gap: 8 (ref 5–15)
BUN: 33 mg/dL — ABNORMAL HIGH (ref 8–23)
CO2: 23 mmol/L (ref 22–32)
Calcium: 8.1 mg/dL — ABNORMAL LOW (ref 8.9–10.3)
Chloride: 100 mmol/L (ref 98–111)
Creatinine, Ser: 0.94 mg/dL (ref 0.44–1.00)
GFR, Estimated: >60 mL/min (ref >60)
Glucose, Bld: 152 mg/dL — ABNORMAL HIGH (ref 70–99)
Potassium: 5.1 mmol/L (ref 3.5–5.1)
Sodium: 131 mmol/L — ABNORMAL LOW (ref 135–145)
Total Bilirubin: 0.7 mg/dL (ref 0.3–1.2)
Total Protein: 4.4 g/dL — ABNORMAL LOW (ref 6.5–8.1)

## 2022-01-09 LAB — SURGICAL PATHOLOGY

## 2022-01-09 NOTE — Discharge Summary (Signed)
Physician Discharge Summary  Patient ID: Desiree Ortega MRN: [id number] DOB/AGE: 08/09/1945 76 y.o.  Admit date: 01/07/2022 Discharge date: 01/12/2022  Admission Diagnoses:  Lung cancer Dyslipidemia  Discharge Diagnoses:   Lung cancer  S/P robotic-assisted right upper lobectomy Dyslipidemia  Discharged Condition: stable  History of Present Illness:    Desiree Ortega 76 y.o. female referred by Dr. Lamonte Sakai for surgical evaluation of a 2.4 cm right upper lobe pulmonary nodule. This was found incidentally while she was being evaluated for spinal issues.  She is a former smoker but quit in 2003.  She admits to a 20 pound weight loss.  She denies any shortness of breath or coughs.  She has had some neuropathic radicular pain, but this is all related to her spinal issues. The lung lesion has minimal uptake on PET/CT. We discussed several options for obtaining a tissue diagnosis, and the patient is elected to proceed directly with a surgical biopsy.  If this is positive for primary lung cancer we will then proceed with robotic assisted right upper lobectomy.  The risks and benefits have been discussed.  Hospital Course: [name] was admitted for elective surgery on 01/07/22 and taken to the OR where robotic-assisted right lower lobe wedge biopsy was accomplished. This was determined to be adenocarcinoma on frozen section. We proceeded with right upper lobectomy and lymph node dissection. Following the procedure, she was extubated and recovered in the PACU then transferred to [facility]. Vital signs and respiratory status remained stable. She maintained adequate O2 sats on room air. Diet and activity were advanced routinely and well tolerated.  She had a small air leak that slowed and had resolved by 7/23 and the chest tube was removed. Follow up CXR showed trace apical pneumothorax and no change in sub q emphysema.  Th patient's nausea resolved.  She is ambulating without difficulty.  Her surgical  incisions are healing w/o evidence of infection.  She is medically stable for discharge home today.    Consults: None  Significant Diagnostic Studies:    CLINICAL DATA:  Evaluate pneumothorax.   EXAM: PORTABLE CHEST 1 VIEW   COMPARISON:  01/10/22.   FINDINGS: Stable position of right chest tube with tip terminating over the apical segment of the right upper lobe. The right apical pneumothorax is not visualized on today's exam. Heart size and mediastinal contours appear stable. Similar appearance of eventration of the right hemidiaphragm. Small pleural effusions appear similar to the previous exam.   IMPRESSION: 1. Stable position of right chest tube. The right apical pneumothorax is not visualized on today's exam. 2. Stable small bilateral pleural effusions.     Electronically Signed   By: Kerby Moors M.D.   On: 01/11/2022 07:42     Treatments: surgery  01/07/2022   Patient:  [name] Pre-Op Dx: Right upper lobe pulmonary nodule   Post-op Dx:  Right upper lobe NSCLC Procedure: - Robotic assisted right video thoracoscopy - Right upper lobe wedge resection - Right upper lobectomy - Mediastinal lymph node sampling - Intercostal nerve block   Surgeon and Role:      * Lightfoot, Lucile Crater, MD - Primary   Assistant: Macarthur Critchley, PA-C  An experienced assistant was required given the complexity of this surgery and the standard of surgical care. The assistant was needed for exposure, dissection, suctioning, retraction of delicate tissues and sutures, instrument exchange and for overall help during this procedure.     Anesthesia  general EBL:  155ml Blood Administration:  none Specimen:  right upper lobe wedge, right upper lobe, hilar and mediastinal lymph nodes   Drains: 28 F argyle chest tube in right chest Counts: correct     Indications: 76 year old female with a 2.4 cm right upper lobe pulmonary nodule with minimal uptake on PET/CT.  We discussed  several options for obtaining a tissue diagnosis, and the patient is elected to proceed directly with a surgical biopsy.  If this is positive for primary lung cancer we will then proceed with robotic assisted right upper lobectomy.  The risks and benefits have been discussed.   Pathology:  SURGICAL PATHOLOGY  CASE: [id number]  PATIENT: Desiree Ortega  Surgical Pathology Report      Clinical History: pulmonary nodule (cm)      FINAL MICROSCOPIC DIAGNOSIS:   A. 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 LYMPH NODE, 9R, EXCISION:  One benign lymph node, negative for carcinoma (0/1)   C. LYMPH NODE, 10R, EXCISION:  One benign lymph node, negative for carcinoma (0/1)   D. LYMPH NODE, 7, EXCISION:  One benign lymph node, negative for carcinoma (0/1)   E. LYMPH NODE, 7 #2, EXCISION:  One benign lymph node, negative for carcinoma (0/1)   F. LYMPH NODE, 4R, EXCISION:  One benign lymph node, negative for carcinoma (0/1)   G. RIGHT LUNG, UPPER LOBE, LOBECTOMY:  Negative for carcinoma  Bronchial margin and vascular margin free of tumor  One benign lymph node (0/1)   Discharge Exam: Blood pressure (!) 135/53, pulse 91, temperature 98.7 F (37.1 C), temperature source Oral, resp. rate (!) 22, height 5\' 6"  (1.676 m), weight 65.8 kg, SpO2 92 %.  General appearance: alert, cooperative, and no distress Heart: regular rate and rhythm Lungs: clear to auscultation bilaterally Abdomen: soft, non-tender; bowel sounds normal; no masses,  no organomegaly and Right flank ecchymosis Extremities: extremities normal, atraumatic, no cyanosis or edema Wound: clean and dry, some swelling at incision site   Discharge disposition: 01-Home or Self Care        Allergies as  of 01/12/2022       Reactions   Alendronate Sodium Other (See Comments)   heartburn   Codeine Nausea Only   Influenza Vaccines    Local reaction to vaccine in 2014   Tramadol Hcl Nausea Only   Patient doesn't want medication due to nausea   Keflex [cephalexin] Rash   All around her torso area        Medication List     STOP taking these medications    acetaminophen 500 MG tablet Commonly known as: TYLENOL       TAKE these medications    atorvastatin 10 MG tablet Commonly known as: LIPITOR Take 1 tablet (10 mg total) by mouth daily.   b complex vitamins tablet Take 1 tablet by mouth daily.   B-12 1000 MCG Tbcr Take 2,000 mcg by mouth daily.   CALCIUM 600 PO Take 600 mg by mouth daily.   CINNAMON PO Take 2,000 mg by mouth daily.   ferrous sulfate 325 (65 FE) MG tablet Take 1 tablet (325 mg total) by mouth daily with breakfast. Start taking on: January 13, 2022   fexofenadine 180 MG tablet Commonly known as: ALLEGRA Take 1 tablet (180 mg total) by mouth as needed. Seasonal allergies from Spring to Fall  Fish Oil 1000 MG Caps Take 1,000 mg by mouth in the morning and at bedtime.   FLAX SEEDS PO Take 1 capsule by mouth daily.   HYDROcodone-acetaminophen 5-325 MG tablet Commonly known as: NORCO/VICODIN Take 1 tablet by mouth every 6 (six) hours as needed for moderate pain. What changed: reasons to take this   Magnesium 250 MG Tabs Take 250 mg by mouth daily.   Melatonin 10 MG Tabs Take 10 mg by mouth at bedtime as needed (sleep).   melatonin 3 MG Tabs tablet Take 1 tablet (3 mg total) by mouth at bedtime as needed.   multivitamin capsule Take 1 capsule by mouth daily.   ondansetron 4 MG tablet Commonly known as: Zofran Take 1 tablet (4 mg total) by mouth every 8 (eight) hours as needed for nausea or vomiting.   Turmeric 500 MG Caps Take 500 mg by mouth daily.   Ubiquinol 100 MG Caps Take 100 mg by mouth daily.   vitamin C 1000 MG  tablet Take 1 tablet (1,000 mg total) by mouth daily. What changed: when to take this   VITAMIN D PO Take 1,000 Units by mouth daily.   Zinc 50 MG Caps Take 50 mg by mouth daily.        Follow-up Information     Lajuana Matte, MD. Go on 01/16/2022.   Specialty: Cardiothoracic Surgery Why: Your appointment is at 8:50am. Please arrive 30 minutes early for a chest x-ray to be performed by [facility] located on the first floor of the same building. Contact information: [address] [place] [address] [phone]                 Signed:  Ellwood Handler, PA-C 7:33 AM

## 2022-01-09 NOTE — Progress Notes (Signed)
Mobility Specialist Progress Note    01/09/22 1124  Mobility  Activity Ambulated with assistance in hallway  Level of Assistance Minimal assist, patient does 75% or more  Assistive Device Front wheel walker  Distance Ambulated (ft) 300 ft (250+50)  Activity Response Tolerated fair  $Mobility charge 1 Mobility   Post-Mobility: 83 HR, 94% SpO2  Pt received coming out of BR and agreeable. On RA. C/o some fatigue after a quick wash-up at the sink. During, c/o some dizziness but no reliable SpO2 pleth. Encouraged pursed lip breathing and took x1 seated rest break. Returned to chair with call bell in reach and family present.   Hildred Alamin Mobility Specialist

## 2022-01-09 NOTE — Progress Notes (Addendum)
      [address]       [place] [address]             [phone]      2 Days Post-Op Procedure(s) (LRB): XI ROBOTIC ASSISTED THORASCOPY-Right upper lobe WEDGE RESECTION, RIGHT UPPER LOBECTOMY (Right) INTERCOSTAL NERVE BLOCK (Right) NODE DISSECTION (Right) Subjective: Sitting up in bed.  Pain control adequate and says she feels she is breathing OK. No concerns.  Only walked to the bathroom yesterday.   Objective: Vital signs in last 24 hours: Temp:  [98.1 F (36.7 C)-98.5 F (36.9 C)] 98.2 F (36.8 C) (07/21 0343) Pulse Rate:  [69-85] 71 (07/21 0343) Cardiac Rhythm: Normal sinus rhythm (07/20 2057) Resp:  [15-21] 19 (07/21 0343) BP: (98-156)/(51-77) 104/51 (07/21 0343) SpO2:  [90 %-98 %] 98 % (07/21 0343)     Intake/Output from previous day: 07/20 0701 - 07/21 0700 In: 720 [P.O.:720] Out: 760 [Urine:400; Chest Tube:360] Intake/Output this shift: Total I/O In: -  Out: 50 [Chest Tube:50]  General appearance: alert, cooperative, and no distress Neurologic: intact Heart: RRR Lungs: breath sounds clear on the right, more coarse on the left. Small air leak with cough only. Minimal CT drainage. Slightly larger left apical space. Wound: the port sites and CT insertion site are dry.  Lab Results: Recent Labs    01/09/22 0112  WBC 16.3*  HGB 9.3*  HCT 27.5*  PLT 170    BMET:  Recent Labs    01/08/22 0628 01/09/22 0112  NA 134* 131*  K 4.9 5.1  CL 101 100  CO2 24 23  GLUCOSE 133* 152*  BUN 23 33*  CREATININE 0.85 0.94  CALCIUM 8.7* 8.1*     PT/INR:  No results for input(s): "LABPROT", "INR" in the last 72 hours.  ABG    Component Value Date/Time   PHART 7.49 (H) 01/05/2022 1410   HCO3 26.7 01/05/2022 1410   O2SAT 99.5 01/05/2022 1410   CBG (last 3)  No results for input(s): "GLUCAP" in the last 72 hours.  Assessment/Plan: S/P Procedure(s) (LRB): XI ROBOTIC ASSISTED THORASCOPY-Right upper lobe WEDGE RESECTION, RIGHT UPPER  LOBECTOMY (Right) INTERCOSTAL NERVE BLOCK (Right) NODE DISSECTION (Right)  -POD2 robotic-assisted right upper lobectomy for adenocarcinoma. Stable respiratory status on RA. Has minimal CT drainage but small air leak is again seen with cough. CT to water seal. WBC trending up.  Needs more ambulation and work on pulmonary hygiene.    LOS: 2 days    Malon Kindle [phone] 01/09/2022  Agree with above Continue pulmonary hygiene and ambulation Home soon Zayveon Raschke O Gerlene Glassburn

## 2022-01-09 NOTE — Care Management Important Message (Signed)
Important Message  Patient Details  Name: Desiree Ortega MRN: [id number] Date of Birth: 1946/03/28   Medicare Important Message Given:  Yes     Orbie Pyo 01/09/2022, 3:48 PM

## 2022-01-09 NOTE — Discharge Instructions (Signed)

## 2022-01-10 ENCOUNTER — Inpatient Hospital Stay (HOSPITAL_COMMUNITY): Payer: Medicare HMO

## 2022-01-10 LAB — CBC
HCT: 24.9 % — ABNORMAL LOW (ref 36.0–46.0)
Hemoglobin: 8.3 g/dL — ABNORMAL LOW (ref 12.0–15.0)
MCH: 29.3 pg (ref 26.0–34.0)
MCHC: 33.3 g/dL (ref 30.0–36.0)
MCV: 88 fL (ref 80.0–100.0)
Platelets: 170 10*3/uL (ref 150–400)
RBC: 2.83 MIL/uL — ABNORMAL LOW (ref 3.87–5.11)
RDW: 12.5 % (ref 11.5–15.5)
WBC: 15.1 10*3/uL — ABNORMAL HIGH (ref 4.0–10.5)
nRBC: 0 % (ref 0.0–0.2)

## 2022-01-10 LAB — BASIC METABOLIC PANEL
Anion gap: 6 (ref 5–15)
BUN: 27 mg/dL — ABNORMAL HIGH (ref 8–23)
CO2: 26 mmol/L (ref 22–32)
Calcium: 8 mg/dL — ABNORMAL LOW (ref 8.9–10.3)
Chloride: 102 mmol/L (ref 98–111)
Creatinine, Ser: 0.76 mg/dL (ref 0.44–1.00)
GFR, Estimated: >60 mL/min (ref >60)
Glucose, Bld: 115 mg/dL — ABNORMAL HIGH (ref 70–99)
Potassium: 4.4 mmol/L (ref 3.5–5.1)
Sodium: 134 mmol/L — ABNORMAL LOW (ref 135–145)

## 2022-01-10 MED ORDER — FERROUS SULFATE 325 (65 FE) MG PO TABS
325.0000 mg | ORAL_TABLET | Freq: Every day | ORAL | Status: DC
  Administered 2022-01-10 – 2022-01-12 (×3): 325 mg via ORAL
  Filled 2022-01-10 (×3): qty 1

## 2022-01-10 NOTE — Progress Notes (Signed)
Mobility Specialist Progress Note    01/10/22 1311  Mobility  Activity Ambulated with assistance in hallway  Level of Assistance Contact guard assist, steadying assist  Assistive Device Front wheel walker  Distance Ambulated (ft) 420 ft  Activity Response Tolerated well  $Mobility charge 1 Mobility   Pt received in chair and agreeable. Passing flatus during. Returned to BR to attempt BM. Shortly after leaving room, family came out to say pt was getting sick. RN notified.   Hildred Alamin Mobility Specialist

## 2022-01-10 NOTE — Plan of Care (Signed)

## 2022-01-10 NOTE — Progress Notes (Addendum)
      [address]       [place] [address]             [phone]       3 Days Post-Op Procedure(s) (LRB): XI ROBOTIC ASSISTED THORASCOPY-Right upper lobe WEDGE RESECTION, RIGHT UPPER LOBECTOMY (Right) INTERCOSTAL NERVE BLOCK (Right) NODE DISSECTION (Right)  Subjective: Patient asking if chest tube can "come out"  Objective: Vital signs in last 24 hours: Temp:  [98 F (36.7 C)-98.9 F (37.2 C)] 98.4 F (36.9 C) (07/22 0813) Pulse Rate:  [80-94] 94 (07/22 0813) Cardiac Rhythm: (P) Normal sinus rhythm (07/22 0700) Resp:  [15-21] 20 (07/22 0813) BP: (103-169)/(52-82) 131/52 (07/22 0813) SpO2:  [94 %-99 %] 94 % (07/22 0813)     Intake/Output from previous day: 07/21 0701 - 07/22 0700 In: -  Out: 241 [Stool:1; Chest Tube:240]   Physical Exam:  Cardiovascular: RRR Pulmonary: Coarse on the right and clear on the left Abdomen: Soft, non tender, bowel sounds present. Extremities: Tracebilateral lower extremity edema. Wounds: Clean and dry.  No erythema or signs of infection. Ecchymosis right lower side Chest Tube: to water seal, small, intermittent air leak with cough  Lab Results: CBC: Recent Labs    01/09/22 0112 01/10/22 0049  WBC 16.3* 15.1*  HGB 9.3* 8.3*  HCT 27.5* 24.9*  PLT 170 170   BMET:  Recent Labs    01/09/22 0112 01/10/22 0049  NA 131* 134*  K 5.1 4.4  CL 100 102  CO2 23 26  GLUCOSE 152* 115*  BUN 33* 27*  CREATININE 0.94 0.76  CALCIUM 8.1* 8.0*    PT/INR: No results for input(s): "LABPROT", "INR" in the last 72 hours. ABG:  INR: Will add last result for INR, ABG once components are confirmed Will add last 4 CBG results once components are confirmed  Assessment/Plan:  1. CV - SR. 2.  Pulmonary - On room air. Chest tube with 240 cc last 24 hours. Chest tube is to water seal. There is a small, intermittent  air leak with cough. CXR this am shows small right apical pneumothorax with right base atelectasis. Chest tube  to remain for now. Encourage incentive spirometer and flutter valve. Final pathology: TNM code:pT1b, pN0. Surgeon to discuss with patient 3. Anemia-H and H this am 8.3 and 24.9. Start oral ferrous 4. On Lovenox for DVT prophylaxis 5. Leukocytosis-WBC decreased to 15,100, afebrile.  Likely SIRS. Continue with pulmonary toilet  Sharalyn Ink ZimmermanPA-C 01/10/2022,9:10 AM [phone]   Small air leak with cough- leave to water seal Prob remove tube tomorrow Patient walking in hall   patient examined and medical record reviewed,agree with above note. Dahlia Byes 01/10/2022

## 2022-01-11 ENCOUNTER — Inpatient Hospital Stay (HOSPITAL_COMMUNITY): Payer: Medicare HMO

## 2022-01-11 LAB — CBC
HCT: 23.4 % — ABNORMAL LOW (ref 36.0–46.0)
Hemoglobin: 7.8 g/dL — ABNORMAL LOW (ref 12.0–15.0)
MCH: 29.7 pg (ref 26.0–34.0)
MCHC: 33.3 g/dL (ref 30.0–36.0)
MCV: 89 fL (ref 80.0–100.0)
Platelets: 195 10*3/uL (ref 150–400)
RBC: 2.63 MIL/uL — ABNORMAL LOW (ref 3.87–5.11)
RDW: 12.9 % (ref 11.5–15.5)
WBC: 13.1 10*3/uL — ABNORMAL HIGH (ref 4.0–10.5)
nRBC: 0 % (ref 0.0–0.2)

## 2022-01-11 MED ORDER — SODIUM CHLORIDE 0.9 % IV SOLN
12.5000 mg | Freq: Four times a day (QID) | INTRAVENOUS | Status: DC | PRN
  Filled 2022-01-11: qty 0.5

## 2022-01-11 NOTE — Progress Notes (Addendum)
      [address]       [place] [address]             [phone]       4 Days Post-Op Procedure(s) (LRB): XI ROBOTIC ASSISTED THORASCOPY-Right upper lobe WEDGE RESECTION, RIGHT UPPER LOBECTOMY (Right) INTERCOSTAL NERVE BLOCK (Right) NODE DISSECTION (Right)  Subjective: Patient had bowel movement with "black stool" earlier this am  Objective: Vital signs in last 24 hours: Temp:  [97.8 F (36.6 C)-98.4 F (36.9 C)] 98.4 F (36.9 C) (07/23 0727) Pulse Rate:  [85-99] 85 (07/23 0727) Cardiac Rhythm: Normal sinus rhythm (07/23 0722) Resp:  [20] 20 (07/23 0727) BP: (117-149)/(47-106) 128/54 (07/23 0727) SpO2:  [90 %-96 %] 95 % (07/23 0727)     Intake/Output from previous day: 07/22 0701 - 07/23 0700 In: 478 [P.O.:478] Out: 410 [Emesis/NG output:300; Chest Tube:110]   Physical Exam:  Cardiovascular: RRR Pulmonary: Coarse on the right and clear on the left Abdomen: Soft, non tender, bowel sounds present. Extremities: Trace bilateral lower extremity edema. Wounds: Clean and dry.  No erythema or signs of infection. Ecchymosis right lower side. Mild bloody drainage from around chest tube. Chest Tube: to water seal, tidling but no air leak with cough  Lab Results: CBC: Recent Labs    01/10/22 0049 01/11/22 0040  WBC 15.1* 13.1*  HGB 8.3* 7.8*  HCT 24.9* 23.4*  PLT 170 195    BMET:  Recent Labs    01/09/22 0112 01/10/22 0049  NA 131* 134*  K 5.1 4.4  CL 100 102  CO2 23 26  GLUCOSE 152* 115*  BUN 33* 27*  CREATININE 0.94 0.76  CALCIUM 8.1* 8.0*     PT/INR: No results for input(s): "LABPROT", "INR" in the last 72 hours. ABG:  INR: Will add last result for INR, ABG once components are confirmed Will add last 4 CBG results once components are confirmed  Assessment/Plan:  1. CV - SR. 2.  Pulmonary - On room air. Chest tube with 100 cc last 24 hours. Chest tube is to water seal and there is tidling but no air leak with cough. CXR this  am shows small bilateral pleural effusions, no pneumothorax, right lateral chest wall subcutaneous emphysema. As discuss with Dr. Prescott Gum, remove chest tube. Encourage incentive spirometer and flutter valve. Final pathology: TNM code:pT1b, pN0. Surgeon to discuss with patient 3. Anemia-H and H this am 8.3 and 24.9. Stop Lovenox. Continue oral ferrous. Check in am 4. On Lovenox for DVT prophylaxis 5. Leukocytosis-WBC decreased to 13,100, afebrile.  Likely SIRS. Continue with pulmonary toilet 6. GI-black stool per patient. Oral ferrous started yesterday for anemia.Stop Lovenox.   Desiree M ZimmermanPA-C 01/11/2022,8:46 AM  Patient examined and chest x-ray reviewed Agree with removal of chest tube today Patient's nausea improved by avoiding narcotics using Tylenol for pain Plan for DC in a.m. if chest x-ray satisfactory  patient examined and medical record reviewed,agree with above note. Desiree Ortega 01/11/2022

## 2022-01-11 NOTE — Progress Notes (Signed)
Patient reports moderate sized black stool.  RN unable to see, was already flushed. Will notify MD.

## 2022-01-11 NOTE — Plan of Care (Signed)

## 2022-01-12 ENCOUNTER — Inpatient Hospital Stay (HOSPITAL_COMMUNITY): Payer: Medicare HMO

## 2022-01-12 DIAGNOSIS — J939 Pneumothorax, unspecified: Secondary | ICD-10-CM | POA: Diagnosis not present

## 2022-01-12 DIAGNOSIS — J9811 Atelectasis: Secondary | ICD-10-CM | POA: Diagnosis not present

## 2022-01-12 DIAGNOSIS — J90 Pleural effusion, not elsewhere classified: Secondary | ICD-10-CM | POA: Diagnosis not present

## 2022-01-12 LAB — CBC
HCT: 22.7 % — ABNORMAL LOW (ref 36.0–46.0)
Hemoglobin: 7.4 g/dL — ABNORMAL LOW (ref 12.0–15.0)
MCH: 29.5 pg (ref 26.0–34.0)
MCHC: 32.6 g/dL (ref 30.0–36.0)
MCV: 90.4 fL (ref 80.0–100.0)
Platelets: 244 10*3/uL (ref 150–400)
RBC: 2.51 MIL/uL — ABNORMAL LOW (ref 3.87–5.11)
RDW: 12.9 % (ref 11.5–15.5)
WBC: 13.1 10*3/uL — ABNORMAL HIGH (ref 4.0–10.5)
nRBC: 0.2 % (ref 0.0–0.2)

## 2022-01-12 MED ORDER — FERROUS SULFATE 325 (65 FE) MG PO TABS: 325.0000 mg | ORAL_TABLET | Freq: Every day | ORAL | 0 refills | Status: DC

## 2022-01-12 MED ORDER — HYDROCODONE-ACETAMINOPHEN 5-325 MG PO TABS
1.0000 | ORAL_TABLET | Freq: Four times a day (QID) | ORAL | 0 refills | Status: DC | PRN
Start: 2022-01-12 — End: 2022-01-16

## 2022-01-12 MED ORDER — ONDANSETRON HCL 4 MG PO TABS: 4.0000 mg | ORAL_TABLET | Freq: Three times a day (TID) | ORAL | 0 refills | Status: DC | PRN

## 2022-01-12 NOTE — Progress Notes (Signed)
      [address]       [place] [address]             [place] Record #[id number] Date of Birth: [birth year]  Referring: Tonia Ghent, MD Primary Care: Tonia Ghent, MD Primary Cardiologist:None  Reason for visit:   follow-up  History of Present Illness:     Ms. Eagleton presents for her first follow-up appointment after undergoing a lobectomy.  Overall she is doing well.  Her pain is well controlled.  Physical Exam: There were no vitals taken for this visit.  Alert NAD Incision clean, stitch removed.   Abdomen, ND No peripheral edema   Diagnostic Studies & Laboratory data:  Path:  FINAL MICROSCOPIC DIAGNOSIS:   A. 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 LYMPH NODE, 9R, EXCISION:  One benign lymph node, negative for carcinoma (0/1)   C. LYMPH NODE, 10R, EXCISION:  One benign lymph node, negative for carcinoma (0/1)   D. LYMPH NODE, 7, EXCISION:  One benign lymph node, negative for carcinoma (0/1)   E. LYMPH NODE, 7 #2, EXCISION:  One benign lymph node, negative for carcinoma (0/1)   F. LYMPH NODE, 4R, EXCISION:  One benign lymph node, negative for carcinoma (0/1)   G. RIGHT LUNG, UPPER LOBE, LOBECTOMY:  Negative for carcinoma  Bronchial margin and vascular margin free of tumor  One benign lymph node (0/1)   ONCOLOGY TABLE:   LUNG: Resection   Synchronous Tumors: Not applicable  Total Number of Primary Tumors: 1  Procedure: Wedge resection with subsequent lobectomy  Specimen Laterality: Right  Tumor Focality: Unifocal  Tumor Site: Upper lobe  Tumor Size:       Total Tumor Size: 1.7 cm in greatest dimension (measured from  slide)       Invasive Tumor Size  (applies only to invasive nonmucinous  adenocarcinoma with a lepidic            component): 1.5 cm  Histologic Type: Adenocarcinoma nonmucinous with micropapillary features  (50%)  Visceral Pleura Invasion: Not identified  Direct Invasion of Adjacent Structures: No adjacent structures present  Lymphovascular Invasion: Not identified  Margins: All margins negative for invasive carcinoma       Closest Margin(s) to Invasive Carcinoma: 3.5 cm from bronchial  margin       Margin(s) Involved by Invasive Carcinoma: Not applicable        Margin Status for Non-Invasive Tumor: Not applicable  Treatment Effect: No known presurgical therapy  Regional Lymph Nodes:       Number of Lymph Nodes Involved: 0       Number of Lymph Nodes Examined: 6                       Nodal Sites Examined: Stations 4R, 7, 9R, 10R and  11R  Distant Metastasis: Not applicable  Pathologic Stage Classification (pTNM, AJCC 8th Edition): pT1b, pN0     Assessment / Plan:   76yo female s/p RULectomy for T1bN0M0 Stage I adenocarcinoma Will f/u in 1 month with a CXR.   Lajuana Matte 01/12/2022 8:51 AM

## 2022-01-12 NOTE — Progress Notes (Addendum)
      [address]       [place] [address]             [phone]      5 Days Post-Op Procedure(s) (LRB): XI ROBOTIC ASSISTED THORASCOPY-Right upper lobe WEDGE RESECTION, RIGHT UPPER LOBECTOMY (Right) INTERCOSTAL NERVE BLOCK (Right) NODE DISSECTION (Right)  Subjective:  Patient a little nervous about going home.  But knows overall she will rest better and be more comfortable.  She has had no further nausea and per her request I have documented sensitivity in her chart.  + ambulation  + BM  Objective: Vital signs in last 24 hours: Temp:  [98.2 F (36.8 C)-98.7 F (37.1 C)] 98.7 F (37.1 C) (07/24 0435) Pulse Rate:  [85-107] 91 (07/23 1931) Cardiac Rhythm: Normal sinus rhythm (07/23 1900) Resp:  [18-25] 22 (07/24 0435) BP: (121-135)/(49-58) 135/53 (07/24 0435) SpO2:  [92 %-97 %] 92 % (07/23 1931)  Intake/Output from previous day: 07/23 0701 - 07/24 0700 In: 400 [P.O.:400] Out: 100 [Chest Tube:100]  General appearance: alert, cooperative, and no distress Heart: regular rate and rhythm Lungs: clear to auscultation bilaterally Abdomen: soft, non-tender; bowel sounds normal; no masses,  no organomegaly and Right flank ecchymosis Extremities: extremities normal, atraumatic, no cyanosis or edema Wound: clean and dry, some swelling at incision site  Lab Results: Recent Labs    01/11/22 0040 01/12/22 0031  WBC 13.1* 13.1*  HGB 7.8* 7.4*  HCT 23.4* 22.7*  PLT 195 244   BMET:  Recent Labs    01/10/22 0049  NA 134*  K 4.4  CL 102  CO2 26  GLUCOSE 115*  BUN 27*  CREATININE 0.76  CALCIUM 8.0*    PT/INR: No results for input(s): "LABPROT", "INR" in the last 72 hours. ABG    Component Value Date/Time   PHART 7.49 (H) 01/05/2022 1410   HCO3 26.7 01/05/2022 1410   O2SAT 99.5 01/05/2022 1410   CBG (last 3)  No results for input(s): "GLUCAP" in the last 72 hours.  Assessment/Plan: S/P Procedure(s) (LRB): XI ROBOTIC ASSISTED THORASCOPY-Right  upper lobe WEDGE RESECTION, RIGHT UPPER LOBECTOMY (Right) INTERCOSTAL NERVE BLOCK (Right) NODE DISSECTION (Right)  CV- hemodynamically stable in NSR Pulm- Ct removed yesterday, F/U CXR shows trace apical space, sub q emphysema along right side is stable Anemia- Hgb stable at 7.4, minimal drop from 7.8 yesterday, continue iron GI- nausea with medication at times, will d/c with zofran Dispo- patient stable, will d/c home today   LOS: 5 days    Ellwood Handler, PA-C 01/12/2022

## 2022-01-13 ENCOUNTER — Other Ambulatory Visit: Payer: Self-pay | Admitting: *Deleted

## 2022-01-13 NOTE — Patient Outreach (Signed)
  Care Coordination [facility] Note Transition Care Management Follow-up Telephone Call Date of discharge and from where: 01/12/2022 [facility] How have you been since you were released from the hospital? Doing good Any questions or concerns? Yes Patient did not receive a prescription for Ferrous Sulfate 325 mg. RN Made patient aware this is over the counter  Items Reviewed: Did the pt receive and understand the discharge instructions provided? Yes  Medications obtained and verified? Yes  Other? Y Explained Ferrous Sulfate over the counter   Any new allergies since your discharge? No  Dietary orders reviewed? No Do you have support at home? Yes   Home Care and Equipment/Supplies: Were home health services ordered? no If so, what is the name of the agency? N   Has the agency set up a time to come to the patient's home? not applicable Were any new equipment or medical supplies ordered?  Yes: A Gilford Rile was ordered but patient already has one What is the name of the medical supply agency?  N/a Were you able to get the supplies/equipment? Patient already had one Do you have any questions related to the use of the equipment or supplies? No  Functional Questionnaire: (I = Independent and D = Dependent) ADLs:I   Bathing/Dressing- I  Meal Prep- I  Eating- I  Maintaining continence- I  Transferring/Ambulation- I  Managing Meds- I  Follow up appointments reviewed:  PCP Hospital f/u appt confirmed? [facility] f/u appt confirmed? Yes  Scheduled to see [phone] 8:50 Are transportation arrangements needed? No  If their condition worsens, is the pt aware to call PCP or go to the Emergency Dept.? Y  Was the patient provided with contact information for the PCP's office or ED? Yes Was to pt encouraged to call back with questions or concerns? Yes  SDOH assessments and interventions completed:   Yes  Care Coordination Interventions Activated:  No Care Coordination Interventions:    n/a  Encounter Outcome:  Pt. Visit Completed

## 2022-01-15 ENCOUNTER — Other Ambulatory Visit: Payer: Self-pay | Admitting: *Deleted

## 2022-01-15 NOTE — Progress Notes (Signed)
The proposed treatment discussed in conference is for discussion purpose only and is not a binding recommendation.  The patients have not been physically examined, or presented with their treatment options.  Therefore, final treatment plans cannot be decided.  

## 2022-01-16 ENCOUNTER — Ambulatory Visit (INDEPENDENT_AMBULATORY_CARE_PROVIDER_SITE_OTHER): Payer: Self-pay | Admitting: Thoracic Surgery (Cardiothoracic Vascular Surgery)

## 2022-01-16 VITALS — BP 121/64 | HR 100 | Resp 20 | Ht 66.0 in | Wt 147.0 lb

## 2022-01-16 DIAGNOSIS — R911 Solitary pulmonary nodule: Secondary | ICD-10-CM

## 2022-01-16 DIAGNOSIS — Z902 Acquired absence of lung [part of]: Secondary | ICD-10-CM

## 2022-01-16 MED ORDER — ONDANSETRON HCL 4 MG PO TABS: 4.0000 mg | ORAL_TABLET | Freq: Three times a day (TID) | ORAL | 0 refills | Status: DC | PRN

## 2022-01-16 MED ORDER — HYDROCODONE-ACETAMINOPHEN 5-325 MG PO TABS: 1.0000 | ORAL_TABLET | Freq: Four times a day (QID) | ORAL | 0 refills | Status: DC | PRN

## 2022-01-19 ENCOUNTER — Telehealth: Payer: Self-pay | Admitting: Internal Medicine

## 2022-01-19 NOTE — Telephone Encounter (Signed)
Scheduled appt per 7/28 referral. Pt is aware of appt date and time. Pt is aware to arrive 15 mins prior to appt time and to bring and updated insurance card. Pt is aware of appt location.

## 2022-01-20 ENCOUNTER — Telehealth: Payer: Self-pay | Admitting: Family Medicine

## 2022-01-20 NOTE — Telephone Encounter (Signed)
Patient called and stated that she is out of the medication HYDROcodone-acetaminophen (NORCO/VICODIN) 5-325 MG tablet and wanted to know if she can get something else. Call back number [phone].

## 2022-01-20 NOTE — Telephone Encounter (Signed)
Patient also sent mychart message about this and was sent to Dr. Damita Dunnings.

## 2022-01-21 ENCOUNTER — Other Ambulatory Visit: Payer: Self-pay | Admitting: Family Medicine

## 2022-01-30 ENCOUNTER — Other Ambulatory Visit: Payer: Self-pay

## 2022-01-30 DIAGNOSIS — C3491 Malignant neoplasm of unspecified part of right bronchus or lung: Secondary | ICD-10-CM

## 2022-02-02 ENCOUNTER — Other Ambulatory Visit: Payer: Self-pay

## 2022-02-02 ENCOUNTER — Inpatient Hospital Stay: Payer: Medicare HMO

## 2022-02-02 ENCOUNTER — Encounter: Payer: Self-pay | Admitting: Internal Medicine

## 2022-02-02 ENCOUNTER — Inpatient Hospital Stay: Payer: Medicare HMO | Attending: Internal Medicine | Admitting: Internal Medicine

## 2022-02-02 VITALS — BP 176/76 | HR 98 | Temp 98.3°F | Resp 15 | Ht 66.0 in | Wt 140.8 lb

## 2022-02-02 DIAGNOSIS — C3411 Malignant neoplasm of upper lobe, right bronchus or lung: Secondary | ICD-10-CM | POA: Diagnosis not present

## 2022-02-02 DIAGNOSIS — M810 Age-related osteoporosis without current pathological fracture: Secondary | ICD-10-CM

## 2022-02-02 DIAGNOSIS — Z87891 Personal history of nicotine dependence: Secondary | ICD-10-CM | POA: Insufficient documentation

## 2022-02-02 DIAGNOSIS — Z8042 Family history of malignant neoplasm of prostate: Secondary | ICD-10-CM | POA: Diagnosis not present

## 2022-02-02 DIAGNOSIS — C3490 Malignant neoplasm of unspecified part of unspecified bronchus or lung: Secondary | ICD-10-CM

## 2022-02-02 DIAGNOSIS — C3491 Malignant neoplasm of unspecified part of right bronchus or lung: Secondary | ICD-10-CM

## 2022-02-02 LAB — CMP (CANCER CENTER ONLY)
ALT: 8 U/L (ref 0–44)
AST: 11 U/L — ABNORMAL LOW (ref 15–41)
Albumin: 3.8 g/dL (ref 3.5–5.0)
Alkaline Phosphatase: 82 U/L (ref 38–126)
Anion gap: 4 — ABNORMAL LOW (ref 5–15)
BUN: 14 mg/dL (ref 8–23)
CO2: 31 mmol/L (ref 22–32)
Calcium: 9 mg/dL (ref 8.9–10.3)
Chloride: 103 mmol/L (ref 98–111)
Creatinine: 0.55 mg/dL (ref 0.44–1.00)
GFR, Estimated: >60 mL/min (ref >60)
Glucose, Bld: 128 mg/dL — ABNORMAL HIGH (ref 70–99)
Potassium: 4 mmol/L (ref 3.5–5.1)
Sodium: 138 mmol/L (ref 135–145)
Total Bilirubin: 0.3 mg/dL (ref 0.3–1.2)
Total Protein: 6.9 g/dL (ref 6.5–8.1)

## 2022-02-02 LAB — CBC WITH DIFFERENTIAL (CANCER CENTER ONLY)
Abs Immature Granulocytes: 0.09 10*3/uL — ABNORMAL HIGH (ref 0.00–0.07)
Basophils Absolute: 0.1 10*3/uL (ref 0.0–0.1)
Basophils Relative: 1 %
Eosinophils Absolute: 0.3 10*3/uL (ref 0.0–0.5)
Eosinophils Relative: 3 %
HCT: 35 % — ABNORMAL LOW (ref 36.0–46.0)
Hemoglobin: 11.3 g/dL — ABNORMAL LOW (ref 12.0–15.0)
Immature Granulocytes: 1 %
Lymphocytes Relative: 23 %
Lymphs Abs: 2.4 10*3/uL (ref 0.7–4.0)
MCH: 28.3 pg (ref 26.0–34.0)
MCHC: 32.3 g/dL (ref 30.0–36.0)
MCV: 87.7 fL (ref 80.0–100.0)
Monocytes Absolute: 0.8 10*3/uL (ref 0.1–1.0)
Monocytes Relative: 7 %
Neutro Abs: 6.8 10*3/uL (ref 1.7–7.7)
Neutrophils Relative %: 65 %
Platelet Count: 482 10*3/uL — ABNORMAL HIGH (ref 150–400)
RBC: 3.99 MIL/uL (ref 3.87–5.11)
RDW: 14.2 % (ref 11.5–15.5)
WBC Count: 10.5 10*3/uL (ref 4.0–10.5)
nRBC: 0 % (ref 0.0–0.2)

## 2022-02-02 NOTE — Progress Notes (Signed)
[place] [phone]   [phone]  CONSULT NOTE  REFERRING PHYSICIAN: Dr. Melodie Bouillon  REASON FOR CONSULTATION:  76 years old white female recently diagnosed with lung cancer.  HPI Desiree Ortega is a 76 y.o. female with past medical history significant for dyslipidemia osteoporosis, kidney stone, colon polyps, bladder prolapse, seasonal allergy as well as migraine.  The patient also has remote history of smoking but quit in 2003.  She was complaining of neck, mid back and low back pain.  The patient had MRI of the cervical, thoracic and lumbar spines performed on July 26, 2021 and incidentally it showed suspicious right upper lobe lung density.  The patient had CT scan of the chest with contrast on 09/30/2021 and it showed 2.5 x 1.7 cm spiculated opacity in the right upper lobe with other pulmonary nodules bilaterally measuring up to 0.4 cm.  A PET scan was performed on Oct 31, 2021 and it showed 2.4 cm right upper lobe pulmonary nodule with mild FDG uptake but not hypermetabolic and low-grade adenocarcinoma could not be excluded.  There was no evidence of metastatic disease.  The patient was referred to Dr. Kipp Brood and on 01/07/2022 she underwent robotic assisted right video thoracoscopy with right upper lobectomy and lymph node sampling.  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  The tumor measured 1.7 cm in greatest dimension.  The dissected lymph nodes were negative for malignancy.  With no evidence for lymphovascular or visceral pleural involvement. The patient is recovering from her surgery well except for the soreness on the right side of the chest.  She currently takes Tylenol on as-needed basis. She was referred by Dr. Kipp Brood to me today for close monitoring of her condition. She denied having any nausea, vomiting,  diarrhea or constipation but she continues to have the right-sided chest pain in addition to shortness of breath with exertion and dry cough.  She has no hemoptysis.  She denied having any fever or chills she has no weight loss or night sweats.  She has no headache or visual changes. Family history significant for father with heart disease and brain hemorrhage.  Mother still alive at age 63.  2 brother had prostate cancer. The patient is married and has 4 daughters.  She was accompanied today by her husband [name].  She used to work for [employer] and also [employer].  She has a history of smoking 0.5 pack/day for around 18 years and quit in 2003.  She drinks alcohol occasionally and no history of drug abuse.    HPI  Past Medical History:  Diagnosis Date   Allergy    Cancer ([place]) on back,leg,tailbone   prev squam and basal cell skin CA removed- Dr. Evorn Gong   Diverticulosis    Moderate   Female bladder prolapse    H/O cold sores    History of colon polyps    History of kidney stones    Hyperlipidemia    Migraine with aura    Osteopenia    prev on fosamax for a few years then started evista at age ~65, T score improved  from -2.29/-1.96 to -1.8/-1.7 as of 06/2011, repeat DXA done 2015    Past Surgical History:  Procedure Laterality Date   BREAST BIOPSY Left    benign   CATARACT EXTRACTION W/ INTRAOCULAR LENS IMPLANT Left 11/2016   CATARACT EXTRACTION W/ INTRAOCULAR LENS IMPLANT Right 12/22/2016  COLONOSCOPY  08/15/2013   cyst on back     2 times   cyst on eyelid     right eye   CYSTOCELE REPAIR N/A 02/01/2018   Procedure: ANTERIOR REPAIR (CYSTOCELE);  Surgeon: Bjorn Loser, MD;  Location: [facility];  Service: Urology;  Laterality: N/A;   CYSTOSCOPY N/A 02/01/2018   Procedure: cystoscopy;  Surgeon: Bjorn Loser, MD;  Location: [facility];  Service: Urology;  Laterality: N/A;   INTERCOSTAL NERVE BLOCK Right 01/07/2022   Procedure: INTERCOSTAL NERVE BLOCK;  Surgeon:  Lajuana Matte, MD;  Location: [facility];  Service: Thoracic;  Laterality: Right;   LAPAROSCOPIC VAGINAL HYSTERECTOMY WITH SALPINGO OOPHORECTOMY Bilateral 02/01/2018   Procedure: LAPAROSCOPIC ASSISTED VAGINAL HYSTERECTOMY WITH BILATERAL SALPINGO OOPHORECTOMY;  Surgeon: Servando Salina, MD;  Location: [facility];  Service: Gynecology;  Laterality: Bilateral;   NODE DISSECTION Right 01/07/2022   Procedure: NODE DISSECTION;  Surgeon: Lajuana Matte, MD;  Location: [facility];  Service: Thoracic;  Laterality: Right;   SQUAMOUS CELL CARCINOMA EXCISION     on back/removed 2 times/and thigh   SQUAMOUS CELL CARCINOMA EXCISION Left 07/15/2016   left lower calf   SSC removed  1988   Birthmark removal partial 1/3   TONSILLECTOMY  1950   TUBAL LIGATION  31's    Family History  Problem Relation Age of Onset   Osteopenia Mother    Hypertension Father    Heart disease Father        AFIB, PVD stents LE's, pacer   Stroke Father    Cancer Brother        prostate, prostatectomy, radiation 5 years later, Hormone Tx   Prostate cancer Brother    Heart disease Brother    Cancer Brother        prostate, prostatectomy   Prostate cancer Brother    Diabetes Other    Cancer Other        Non-Hodgkin's Lymphoma   Depression Daughter    Alcohol abuse Neg Hx    Drug abuse Neg Hx    Colon cancer Neg Hx    Breast cancer Neg Hx    Bladder Cancer Neg Hx    Kidney cancer Neg Hx     Social History Social History   Tobacco Use   Smoking status: Former    Packs/day: 0.50    Years: 36.00    Total pack years: 18.00    Types: Cigarettes    Quit date: 06/22/2001    Years since quitting: 20.6   Smokeless tobacco: Never  Vaping Use   Vaping Use: Never used  Substance Use Topics   Alcohol use: Not Currently    Alcohol/week: 2.0 standard drinks of alcohol    Types: 2 Glasses of wine per week    Comment: occasionally   Drug use: No    Allergies  Allergen Reactions   Alendronate Sodium Other (See  Comments)    heartburn   Codeine Nausea Only   Influenza Vaccines     Local reaction to vaccine in 2014   Tramadol Hcl Nausea Only    Patient doesn't want medication due to nausea   Keflex [Cephalexin] Rash    All around her torso area    Current Outpatient Medications  Medication Sig Dispense Refill   Ascorbic Acid (VITAMIN C) 1000 MG tablet Take 1 tablet (1,000 mg total) by mouth daily. (Patient taking differently: Take 1,000 mg by mouth in the morning and at bedtime.)     atorvastatin (LIPITOR) 10 MG  tablet Take 1 tablet (10 mg total) by mouth daily. 90 tablet 3   b complex vitamins tablet Take 1 tablet by mouth daily.     Calcium Carbonate (CALCIUM 600 PO) Take 600 mg by mouth daily.     Cholecalciferol (VITAMIN D PO) Take 1,000 Units by mouth daily.     CINNAMON PO Take 2,000 mg by mouth daily.     Cyanocobalamin (B-12) 1000 MCG TBCR Take 2,000 mcg by mouth daily.     ferrous sulfate 325 (65 FE) MG tablet Take 1 tablet (325 mg total) by mouth daily with breakfast. 30 tablet 0   fexofenadine (ALLEGRA) 180 MG tablet Take 1 tablet (180 mg total) by mouth as needed. Seasonal allergies from Spring to Fall     Flaxseed, Linseed, (FLAX SEEDS PO) Take 1 capsule by mouth daily.     HYDROcodone-acetaminophen (NORCO/VICODIN) 5-325 MG tablet Take 1 tablet by mouth every 6 (six) hours as needed for moderate pain. 50 tablet 0   Magnesium 250 MG TABS Take 250 mg by mouth daily.     Melatonin 10 MG TABS Take 10 mg by mouth at bedtime as needed (sleep).     Melatonin 3 MG TABS Take 1 tablet (3 mg total) by mouth at bedtime as needed.     Multiple Vitamin (MULTIVITAMIN) capsule Take 1 capsule by mouth daily.     Omega-3 Fatty Acids (FISH OIL) 1000 MG CAPS Take 1,000 mg by mouth in the morning and at bedtime.     ondansetron (ZOFRAN) 4 MG tablet Take 1 tablet (4 mg total) by mouth every 8 (eight) hours as needed for nausea or vomiting. 20 tablet 0   ondansetron (ZOFRAN) 4 MG tablet Take 1 tablet (4  mg total) by mouth every 8 (eight) hours as needed for nausea or vomiting. 30 tablet 0   Turmeric 500 MG CAPS Take 500 mg by mouth daily.     Ubiquinol 100 MG CAPS Take 100 mg by mouth daily.     Zinc 50 MG CAPS Take 50 mg by mouth daily.     No current facility-administered medications for this visit.    Review of Systems  Constitutional: negative Eyes: negative Ears, nose, mouth, throat, and face: negative Respiratory: positive for cough, dyspnea on exertion, and pleurisy/chest pain Cardiovascular: negative Gastrointestinal: negative Genitourinary:negative Integument/breast: negative Hematologic/lymphatic: negative Musculoskeletal:negative Neurological: negative Behavioral/Psych: negative Endocrine: negative Allergic/Immunologic: negative  Physical Exam  OYD:XAJOI, healthy, no distress, well nourished, and well developed SKIN: skin color, texture, turgor are normal, no rashes or significant lesions HEAD: Normocephalic, No masses, lesions, tenderness or abnormalities EYES: normal, PERRLA, Conjunctiva are pink and non-injected EARS: External ears normal, Canals clear OROPHARYNX:no exudate, no erythema, and lips, buccal mucosa, and tongue normal  NECK: supple, no adenopathy, no JVD LYMPH:  no palpable lymphadenopathy, no hepatosplenomegaly BREAST:not examined LUNGS: clear to auscultation , and palpation HEART: regular rate & rhythm, no murmurs, and no gallops ABDOMEN:abdomen soft, non-tender, normal bowel sounds, and no masses or organomegaly BACK: Back symmetric, no curvature., No CVA tenderness EXTREMITIES:no joint deformities, effusion, or inflammation, no edema  NEURO: alert & oriented x 3 with fluent speech, no focal motor/sensory deficits  PERFORMANCE STATUS: ECOG 1  LABORATORY DATA: Lab Results  Component Value Date   WBC 10.5 02/02/2022   HGB 11.3 (L) 02/02/2022   HCT 35.0 (L) 02/02/2022   MCV 87.7 02/02/2022   PLT 482 (H) 02/02/2022      Chemistry       Component  Value Date/Time   NA 138 02/02/2022 1330   K 4.0 02/02/2022 1330   CL 103 02/02/2022 1330   CO2 31 02/02/2022 1330   BUN 14 02/02/2022 1330   CREATININE 0.55 02/02/2022 1330      Component Value Date/Time   CALCIUM 9.0 02/02/2022 1330   ALKPHOS 82 02/02/2022 1330   AST 11 (L) 02/02/2022 1330   ALT 8 02/02/2022 1330   BILITOT 0.3 02/02/2022 1330       RADIOGRAPHIC STUDIES: DG Chest 2 View  Result Date: 01/12/2022 CLINICAL DATA:  Pneumothorax EXAM: CHEST - 2 VIEW COMPARISON:  Radiographs 01/11/2022 FINDINGS: Comparison is made with chest radiographs 7 09/08/2021 at 1:53 p.m (these radiographs have an incorrect time stamp of 720-23 at 2:11 p.m.). Trace right apical pneumothorax was not well demonstrated on the prior radiograph. Remainder unchanged. Right lower lobe atelectasis. Small right pleural effusion. Subcutaneous emphysema right chest wall. Normal cardiomediastinal silhouette. Aortic calcification. No acute osseous abnormality. IMPRESSION: 1. Tiny right apical pneumothorax. 2. Small right pleural effusion and right basilar atelectasis Electronically Signed   By: Placido Sou M.D.   On: 01/12/2022 08:36   DG CHEST PORT 1 VIEW  Result Date: 01/11/2022 CLINICAL DATA:  Encounter for chest tube removal. EXAM: PORTABLE CHEST 1 VIEW COMPARISON:  AP chest 01/11/2022, earlier same day; AP chest 01/09/2022 FINDINGS: Note is made that this study is listed in PACS with the date 01/11/2022 at 1:53 p.m. which is correct. When viewing the AP chest image, there is an incorrect time stamp of 01/10/2022 at 2:11 p.m. comparison is made to the most recent prior dated 01/11/2022 at 6:56 a.m. Interval removal of right chest tube. No definite pneumothorax is seen. There is moderate right inferior greater than superior chest wall subcutaneous air that is similar to prior. Minimal right basilar linear subsegmental atelectasis. The left lung is clear. Cardiac silhouette and mediastinal contours are  within normal limits with moderate calcification within the aortic arch. Mild dextrocurvature of the midthoracic spine. IMPRESSION: Interval removal of right-sided chest tube. No definite pneumothorax is seen. Electronically Signed   By: Yvonne Kendall M.D.   On: 01/11/2022 15:28   DG CHEST PORT 1 VIEW  Result Date: 01/11/2022 CLINICAL DATA:  Evaluate pneumothorax. EXAM: PORTABLE CHEST 1 VIEW COMPARISON:  01/10/22. FINDINGS: Stable position of right chest tube with tip terminating over the apical segment of the right upper lobe. The right apical pneumothorax is not visualized on today's exam. Heart size and mediastinal contours appear stable. Similar appearance of eventration of the right hemidiaphragm. Small pleural effusions appear similar to the previous exam. IMPRESSION: 1. Stable position of right chest tube. The right apical pneumothorax is not visualized on today's exam. 2. Stable small bilateral pleural effusions. Electronically Signed   By: Kerby Moors M.D.   On: 01/11/2022 07:42   DG CHEST PORT 1 VIEW  Result Date: 01/10/2022 CLINICAL DATA:  Follow-up pneumothorax. EXAM: PORTABLE CHEST 1 VIEW COMPARISON:  01/09/2022 FINDINGS: Right-sided chest tube unchanged with tip over the apex. Interval improvement with only small residual right apical pneumothorax visualized. Slight worsening opacification right base likely layering effusion/atelectasis. Left lung is clear. Cardiomediastinal silhouette and remainder of the exam is unchanged. IMPRESSION: 1. Interval improvement with small residual right apical pneumothorax. Right-sided chest tube unchanged. 2. Slight worsening opacification right base likely layering effusion/atelectasis. Electronically Signed   By: Marin Olp M.D.   On: 01/10/2022 08:02   DG CHEST PORT 1 VIEW  Result Date: 01/09/2022 CLINICAL DATA:  [id number] right chest tube, lung CA, shortness of breath, follow-up study EXAM: PORTABLE CHEST 1 VIEW COMPARISON:  Chest x-ray from yesterday  FINDINGS: Again seen is the right-sided chest tube with its tip at the right pulmonary apex. There is small right apical pneumothorax seen without significant interval change. Soft tissue emphysema at the right lateral chest wall has mildly increased in the interim. The heart size and mediastinal contours are within normal limits. Mild bibasilar atelectasis greater on the right. The visualized skeletal structures are unremarkable. IMPRESSION: 1.  Right-sided chest tube is stable. 2. Small right apical pneumothorax without significant interval change. Soft tissue emphysema of the right lateral chest wall has mildly increased in the interim. 3.  Minor bibasilar atelectasis greater on the right. Electronically Signed   By: Frazier Richards M.D.   On: 01/09/2022 08:43   DG Chest Port 1 View  Result Date: 01/08/2022 CLINICAL DATA:  Lung carcinoma, surgery follow-up EXAM: PORTABLE CHEST 1 VIEW COMPARISON:  Previous studies including the examination of 01/07/2022 FINDINGS: Cardiac size is within normal limits. There are no signs of pulmonary edema or new focal infiltrates. There is small right apical pneumothorax with interval increase. Right chest tube is noted in place. Subcutaneous emphysema is seen in right chest wall. Increased density in the lateral aspect of left lower lung field obscuring the left hemidiaphragm may suggest small effusion and subsegmental atelectasis. IMPRESSION: There is interval increase in small right apical pneumothorax. Increased density in lateral aspect of left lower lung field may be due to small pleural effusion and possibly subsegmental atelectasis. There are no signs of pulmonary edema. Electronically Signed   By: Elmer Picker M.D.   On: 01/08/2022 08:48   DG Chest Port 1 View  Result Date: 01/07/2022 CLINICAL DATA:  Status post RIGHT UPPER lobectomy and mediastinal lymph node sampling. EXAM: PORTABLE CHEST 1 VIEW COMPARISON:  01/05/2022 chest radiograph FINDINGS: RIGHT  thoracostomy tube and RIGHT UPPER lobectomy postoperative changes noted. A tiny (less than 5%) RIGHT apical pneumothorax is noted. Focal opacity/atelectasis in the LATERAL LEFT lung base noted. No other significant changes identified. IMPRESSION: Postoperative changes with tiny (less than 5%) RIGHT apical pneumothorax. LEFT basilar opacity, favor atelectasis. Electronically Signed   By: Margarette Canada M.D.   On: 01/07/2022 12:43   DG Chest 2 View  Result Date: 01/06/2022 CLINICAL DATA:  Preoperative clearance. EXAM: CHEST - 2 VIEW COMPARISON:  CT scan of the chest September 30, 2021. PET-CT Oct 31, 2021. FINDINGS: The known spiculated nodule is poorly visualized on this study. The heart, hila, mediastinum, lungs, and pleura are otherwise unremarkable. IMPRESSION: The known spiculated nodules poorly visualized on this study. No acute abnormalities. Electronically Signed   By: Dorise Bullion III M.D.   On: 01/06/2022 13:10    ASSESSMENT: This is a very pleasant 76 years old recently diagnosed with a stage Ia (T1b, N0, M0) non-small cell lung cancer, adenocarcinoma presented with right upper lobe lung nodule status post right upper lobectomy with lymph node dissection under the care of Dr. Kipp Brood on 01/07/2022.   PLAN: I had a lengthy discussion with the patient and her husband today about her current disease stage, prognosis and treatment options. I personally and independently reviewed the scan images as well as the pathology reports and discussed the result with the patient and her husband. I explained to the patient that the median 5 years overall survival for patient with a stage Ia non-small cell lung cancer is around 80% and the 20%  may either die from lung cancer or any other causes which is the majority of the patient. I also explained to the patient that there is no survival benefit for adjuvant systemic chemotherapy or radiation for patient with resected stage Ia non-small cell lung cancer. I  recommended for the patient to continue on observation with repeat CT scan of the chest in 6 months. The patient was advised to call immediately if she has any other concerning symptoms in the interval.   The patient voices understanding of current disease status and treatment options and is in agreement with the current care plan.  All questions were answered. The patient knows to call the clinic with any problems, questions or concerns. We can certainly see the patient much sooner if necessary.  Thank you so much for allowing me to participate in the care of Desiree Ortega. I will continue to follow up the patient with you and assist in her care.  The total time spent in the appointment was 60 minutes.  Disclaimer: This note was dictated with voice recognition software. Similar sounding words can inadvertently be transcribed and may not be corrected upon review.   Eilleen Kempf February 02, 2022, 2:16 PM

## 2022-02-12 ENCOUNTER — Other Ambulatory Visit: Payer: Self-pay | Admitting: Thoracic Surgery (Cardiothoracic Vascular Surgery)

## 2022-02-12 DIAGNOSIS — C3490 Malignant neoplasm of unspecified part of unspecified bronchus or lung: Secondary | ICD-10-CM

## 2022-02-17 ENCOUNTER — Telehealth: Payer: Self-pay | Admitting: Internal Medicine

## 2022-02-17 NOTE — Telephone Encounter (Signed)
Called patient regarding upcoming 2024 appointments, patient is notified.

## 2022-02-19 NOTE — Progress Notes (Signed)
      [address]       [place] [address]             [phone]        Terica K Albright [place] Medical Record #[id number] Date of Birth: 03/12/1946  Referring: Collene Gobble, MD Primary Care: Tonia Ghent, MD Primary Cardiologist:None  Reason for visit:   follow-up  History of Present Illness:     76 year old female presents for 1 month follow-up appointment after undergoing a right upper lobectomy.  She does complain of dull pain along the access incision.  She denies any paresthesias.  She has had some fatigue with ambulation but denies any shortness of breath.  Physical Exam: BP (!) 160/77   Pulse 86   Resp 20   Ht 5\' 6"  (1.676 m)   Wt 141 lb (64 kg)   SpO2 97% Comment: RA  BMI 22.76 kg/m   Alert NAD Incision clean.   Abdomen, ND No peripheral edema   Diagnostic Studies & Laboratory data: CXR: No effusions.  Small opacity at the apex.  Likely from staple line.     Assessment / Plan:   76yo female s/p RULectomy for T1bN0M0 Stage I adenocarcinoma.  Overall doing well.  I encouraged her to continue with her exercise.  She will continue to have surveillance at the cancer center.  She will follow-up with [place] as needed.   Lajuana Matte 02/20/2022 10:03 AM

## 2022-02-20 ENCOUNTER — Ambulatory Visit
Admission: RE | Admit: 2022-02-20 | Discharge: 2022-02-20 | Disposition: A | Discharge status: Home / Self Care | Payer: Medicare HMO | Source: Ambulatory Visit | Attending: Thoracic Surgery (Cardiothoracic Vascular Surgery) | Admitting: Thoracic Surgery (Cardiothoracic Vascular Surgery)

## 2022-02-20 ENCOUNTER — Ambulatory Visit (INDEPENDENT_AMBULATORY_CARE_PROVIDER_SITE_OTHER): Payer: Self-pay | Admitting: Thoracic Surgery (Cardiothoracic Vascular Surgery)

## 2022-02-20 VITALS — BP 160/77 | HR 86 | Resp 20 | Ht 66.0 in | Wt 141.0 lb

## 2022-02-20 DIAGNOSIS — J984 Other disorders of lung: Secondary | ICD-10-CM | POA: Diagnosis not present

## 2022-02-20 DIAGNOSIS — C3490 Malignant neoplasm of unspecified part of unspecified bronchus or lung: Secondary | ICD-10-CM | POA: Diagnosis not present

## 2022-02-20 DIAGNOSIS — J90 Pleural effusion, not elsewhere classified: Secondary | ICD-10-CM | POA: Diagnosis not present

## 2022-02-20 DIAGNOSIS — J9811 Atelectasis: Secondary | ICD-10-CM | POA: Diagnosis not present

## 2022-02-20 DIAGNOSIS — Z902 Acquired absence of lung [part of]: Secondary | ICD-10-CM

## 2022-03-03 ENCOUNTER — Encounter: Payer: Self-pay | Admitting: Family Medicine

## 2022-03-11 ENCOUNTER — Encounter: Payer: Self-pay | Admitting: Family Medicine

## 2022-03-18 DIAGNOSIS — M4714 Other spondylosis with myelopathy, thoracic region: Secondary | ICD-10-CM | POA: Diagnosis not present

## 2022-03-18 DIAGNOSIS — M549 Dorsalgia, unspecified: Secondary | ICD-10-CM | POA: Diagnosis not present

## 2022-03-18 DIAGNOSIS — D492 Neoplasm of unspecified behavior of bone, soft tissue, and skin: Secondary | ICD-10-CM | POA: Diagnosis not present

## 2022-05-11 ENCOUNTER — Encounter: Payer: Self-pay | Admitting: Family Medicine

## 2022-05-13 ENCOUNTER — Other Ambulatory Visit: Payer: Self-pay | Admitting: Family Medicine

## 2022-05-13 DIAGNOSIS — D649 Anemia, unspecified: Secondary | ICD-10-CM

## 2022-05-22 ENCOUNTER — Other Ambulatory Visit (INDEPENDENT_AMBULATORY_CARE_PROVIDER_SITE_OTHER): Payer: Medicare HMO

## 2022-05-22 DIAGNOSIS — D649 Anemia, unspecified: Secondary | ICD-10-CM

## 2022-05-22 LAB — IRON: Iron: 119 ug/dL (ref 42–145)

## 2022-05-22 LAB — CBC WITH DIFFERENTIAL/PLATELET
Basophils Absolute: 0.1 10*3/uL (ref 0.0–0.1)
Basophils Relative: 1.3 % (ref 0.0–3.0)
Eosinophils Absolute: 0.1 10*3/uL (ref 0.0–0.7)
Eosinophils Relative: 1.1 % (ref 0.0–5.0)
HCT: 43.5 % (ref 36.0–46.0)
Hemoglobin: 14.1 g/dL (ref 12.0–15.0)
Lymphocytes Relative: 36.7 % (ref 12.0–46.0)
Lymphs Abs: 3.1 10*3/uL (ref 0.7–4.0)
MCHC: 32.3 g/dL (ref 30.0–36.0)
MCV: 83.8 fl (ref 78.0–100.0)
Monocytes Absolute: 0.6 10*3/uL (ref 0.1–1.0)
Monocytes Relative: 6.6 % (ref 3.0–12.0)
Neutro Abs: 4.5 10*3/uL (ref 1.4–7.7)
Neutrophils Relative %: 54.3 % (ref 43.0–77.0)
Platelets: 251 10*3/uL (ref 150.0–400.0)
RBC: 5.19 Mil/uL — ABNORMAL HIGH (ref 3.87–5.11)
RDW: 17.3 % — ABNORMAL HIGH (ref 11.5–15.5)
WBC: 8.4 10*3/uL (ref 4.0–10.5)

## 2022-05-22 LAB — FERRITIN: Ferritin: 44.4 ng/mL (ref 10.0–291.0)

## 2022-05-23 ENCOUNTER — Encounter: Payer: Self-pay | Admitting: Family Medicine

## 2022-05-24 ENCOUNTER — Other Ambulatory Visit: Payer: Self-pay | Admitting: Family Medicine

## 2022-05-25 ENCOUNTER — Encounter: Payer: Self-pay | Admitting: Family Medicine

## 2022-07-07 DIAGNOSIS — H524 Presbyopia: Secondary | ICD-10-CM | POA: Diagnosis not present

## 2022-08-03 ENCOUNTER — Other Ambulatory Visit: Payer: Self-pay

## 2022-08-03 ENCOUNTER — Ambulatory Visit (HOSPITAL_COMMUNITY)
Admission: RE | Admit: 2022-08-03 | Discharge: 2022-08-03 | Disposition: A | Discharge status: Home / Self Care | Payer: Medicare HMO | Source: Ambulatory Visit | Attending: Internal Medicine | Admitting: Internal Medicine

## 2022-08-03 ENCOUNTER — Inpatient Hospital Stay: Payer: Medicare HMO | Attending: Internal Medicine

## 2022-08-03 DIAGNOSIS — J439 Emphysema, unspecified: Secondary | ICD-10-CM | POA: Diagnosis not present

## 2022-08-03 DIAGNOSIS — M8580 Other specified disorders of bone density and structure, unspecified site: Secondary | ICD-10-CM | POA: Insufficient documentation

## 2022-08-03 DIAGNOSIS — C3490 Malignant neoplasm of unspecified part of unspecified bronchus or lung: Secondary | ICD-10-CM | POA: Diagnosis not present

## 2022-08-03 DIAGNOSIS — C3411 Malignant neoplasm of upper lobe, right bronchus or lung: Secondary | ICD-10-CM | POA: Insufficient documentation

## 2022-08-03 LAB — CBC WITH DIFFERENTIAL (CANCER CENTER ONLY)
Abs Immature Granulocytes: 0.03 10*3/uL (ref 0.00–0.07)
Basophils Absolute: 0.1 10*3/uL (ref 0.0–0.1)
Basophils Relative: 1 %
Eosinophils Absolute: 0.1 10*3/uL (ref 0.0–0.5)
Eosinophils Relative: 1 %
HCT: 44.7 % (ref 36.0–46.0)
Hemoglobin: 15 g/dL (ref 12.0–15.0)
Immature Granulocytes: 0 %
Lymphocytes Relative: 31 %
Lymphs Abs: 2.7 10*3/uL (ref 0.7–4.0)
MCH: 29.7 pg (ref 26.0–34.0)
MCHC: 33.6 g/dL (ref 30.0–36.0)
MCV: 88.5 fL (ref 80.0–100.0)
Monocytes Absolute: 0.6 10*3/uL (ref 0.1–1.0)
Monocytes Relative: 6 %
Neutro Abs: 5.5 10*3/uL (ref 1.7–7.7)
Neutrophils Relative %: 61 %
Platelet Count: 242 10*3/uL (ref 150–400)
RBC: 5.05 MIL/uL (ref 3.87–5.11)
RDW: 13.1 % (ref 11.5–15.5)
WBC Count: 8.9 10*3/uL (ref 4.0–10.5)
nRBC: 0 % (ref 0.0–0.2)

## 2022-08-03 LAB — CMP (CANCER CENTER ONLY)
ALT: 14 U/L (ref 0–44)
AST: 18 U/L (ref 15–41)
Albumin: 4.2 g/dL (ref 3.5–5.0)
Alkaline Phosphatase: 89 U/L (ref 38–126)
Anion gap: 4 — ABNORMAL LOW (ref 5–15)
BUN: 17 mg/dL (ref 8–23)
CO2: 33 mmol/L — ABNORMAL HIGH (ref 22–32)
Calcium: 9.6 mg/dL (ref 8.9–10.3)
Chloride: 103 mmol/L (ref 98–111)
Creatinine: 0.64 mg/dL (ref 0.44–1.00)
GFR, Estimated: >60 mL/min (ref >60)
Glucose, Bld: 119 mg/dL — ABNORMAL HIGH (ref 70–99)
Potassium: 3.8 mmol/L (ref 3.5–5.1)
Sodium: 140 mmol/L (ref 135–145)
Total Bilirubin: 0.4 mg/dL (ref 0.3–1.2)
Total Protein: 7 g/dL (ref 6.5–8.1)

## 2022-08-03 MED ORDER — IOHEXOL 300 MG/ML  SOLN
100.0000 mL | Freq: Once | INTRAMUSCULAR | Status: AC | PRN
  Administered 2022-08-03: 100 mL via INTRAVENOUS

## 2022-08-05 ENCOUNTER — Inpatient Hospital Stay (HOSPITAL_BASED_OUTPATIENT_CLINIC_OR_DEPARTMENT_OTHER): Payer: Medicare HMO | Admitting: Internal Medicine

## 2022-08-05 VITALS — BP 152/78 | HR 71 | Temp 98.3°F | Resp 16 | Wt 152.6 lb

## 2022-08-05 DIAGNOSIS — C3490 Malignant neoplasm of unspecified part of unspecified bronchus or lung: Secondary | ICD-10-CM | POA: Diagnosis not present

## 2022-08-05 DIAGNOSIS — M8580 Other specified disorders of bone density and structure, unspecified site: Secondary | ICD-10-CM | POA: Diagnosis not present

## 2022-08-05 DIAGNOSIS — C3411 Malignant neoplasm of upper lobe, right bronchus or lung: Secondary | ICD-10-CM | POA: Diagnosis not present

## 2022-08-05 NOTE — Progress Notes (Signed)
[place] Telephone:([phone]) [phone]   [phone]  OFFICE PROGRESS NOTE  Tonia Ghent, MD [place] [address]  DIAGNOSIS: Stage IA (T1b, N0, M0) non-small cell lung cancer, adenocarcinoma presented with right upper lobe lung nodule  PRIOR THERAPY:  status post right upper lobectomy with lymph node dissection under the care of Dr. Kipp Brood on 01/07/2022.   CURRENT THERAPY: Observation.   INTERVAL HISTORY: Desiree Ortega 77 y.o. female returns to the clinic today for 6 months follow-up visit.  The patient is feeling fine today with no concerning complaints.  She denied having any current chest pain, shortness of breath, cough or hemoptysis.  She has no nausea, vomiting, diarrhea or constipation.  She has no headache or visual changes.  She has no recent weight loss or night sweats.  She had repeat CT scan of the chest performed recently and she is here for evaluation and discussion of her scan results.  MEDICAL HISTORY: Past Medical History:  Diagnosis Date   Allergy    Cancer ([place]) on back,leg,tailbone   prev squam and basal cell skin CA removed- Dr. Evorn Gong   Diverticulosis    Moderate   Female bladder prolapse    H/O cold sores    History of colon polyps    History of kidney stones    Hyperlipidemia    Migraine with aura    Osteopenia    prev on fosamax for a few years then started evista at age ~25, T score improved  from -2.29/-1.96 to -1.8/-1.7 as of 06/2011, repeat DXA done 2015    ALLERGIES:  is allergic to alendronate sodium, codeine, influenza vaccines, tramadol hcl, and keflex [cephalexin].  MEDICATIONS:  Current Outpatient Medications  Medication Sig Dispense Refill   Ascorbic Acid (VITAMIN C) 1000 MG tablet Take 1 tablet (1,000 mg total) by mouth daily. (Patient taking differently: Take 1,000 mg by mouth in the morning and at bedtime.)     atorvastatin (LIPITOR) 10 MG tablet Take 1 tablet (10 mg total) by  mouth daily. 90 tablet 3   b complex vitamins tablet Take 1 tablet by mouth daily.     Calcium Carbonate (CALCIUM 600 PO) Take 600 mg by mouth daily.     Cholecalciferol (VITAMIN D PO) Take 1,000 Units by mouth daily.     CINNAMON PO Take 2,000 mg by mouth daily.     Cyanocobalamin (B-12) 1000 MCG TBCR Take 2,000 mcg by mouth daily.     fexofenadine (ALLEGRA) 180 MG tablet Take 1 tablet (180 mg total) by mouth as needed. Seasonal allergies from Spring to Fall     Flaxseed, Linseed, (FLAX SEEDS PO) Take 1 capsule by mouth daily.     HYDROcodone-acetaminophen (NORCO/VICODIN) 5-325 MG tablet Take 1 tablet by mouth every 6 (six) hours as needed for moderate pain. 50 tablet 0   Magnesium 250 MG TABS Take 250 mg by mouth daily.     Melatonin 10 MG TABS Take 10 mg by mouth at bedtime as needed (sleep).     Multiple Vitamin (MULTIVITAMIN) capsule Take 1 capsule by mouth daily.     Omega-3 Fatty Acids (FISH OIL) 1000 MG CAPS Take 1,000 mg by mouth in the morning and at bedtime.     Turmeric 500 MG CAPS Take 500 mg by mouth daily.     Ubiquinol 100 MG CAPS Take 100 mg by mouth daily.     Zinc 50 MG CAPS Take  50 mg by mouth daily.     No current facility-administered medications for this visit.    SURGICAL HISTORY:  Past Surgical History:  Procedure Laterality Date   BREAST BIOPSY Left    benign   CATARACT EXTRACTION W/ INTRAOCULAR LENS IMPLANT Left 11/2016   CATARACT EXTRACTION W/ INTRAOCULAR LENS IMPLANT Right 12/22/2016   COLONOSCOPY  08/15/2013   cyst on back     2 times   cyst on eyelid     right eye   CYSTOCELE REPAIR N/A 02/01/2018   Procedure: ANTERIOR REPAIR (CYSTOCELE);  Surgeon: Bjorn Loser, MD;  Location: [facility];  Service: Urology;  Laterality: N/A;   CYSTOSCOPY N/A 02/01/2018   Procedure: cystoscopy;  Surgeon: Bjorn Loser, MD;  Location: [facility];  Service: Urology;  Laterality: N/A;   INTERCOSTAL NERVE BLOCK Right 01/07/2022   Procedure: INTERCOSTAL NERVE BLOCK;   Surgeon: Lajuana Matte, MD;  Location: [facility];  Service: Thoracic;  Laterality: Right;   LAPAROSCOPIC VAGINAL HYSTERECTOMY WITH SALPINGO OOPHORECTOMY Bilateral 02/01/2018   Procedure: LAPAROSCOPIC ASSISTED VAGINAL HYSTERECTOMY WITH BILATERAL SALPINGO OOPHORECTOMY;  Surgeon: Servando Salina, MD;  Location: [facility];  Service: Gynecology;  Laterality: Bilateral;   NODE DISSECTION Right 01/07/2022   Procedure: NODE DISSECTION;  Surgeon: Lajuana Matte, MD;  Location: [facility];  Service: Thoracic;  Laterality: Right;   SQUAMOUS CELL CARCINOMA EXCISION     on back/removed 2 times/and thigh   SQUAMOUS CELL CARCINOMA EXCISION Left 07/15/2016   left lower calf   SSC removed  1988   Birthmark removal partial 1/3   TONSILLECTOMY  1950   TUBAL LIGATION  1980's    REVIEW OF SYSTEMS:  A comprehensive review of systems was negative.   PHYSICAL EXAMINATION: General appearance: alert, cooperative, and no distress Head: Normocephalic, without obvious abnormality, atraumatic Neck: no adenopathy, no JVD, supple, symmetrical, trachea midline, and thyroid not enlarged, symmetric, no tenderness/mass/nodules Lymph nodes: Cervical, supraclavicular, and axillary nodes normal. Resp: clear to auscultation bilaterally Back: symmetric, no curvature. ROM normal. No CVA tenderness. Cardio: regular rate and rhythm, S1, S2 normal, no murmur, click, rub or gallop GI: soft, non-tender; bowel sounds normal; no masses,  no organomegaly Extremities: extremities normal, atraumatic, no cyanosis or edema  ECOG PERFORMANCE STATUS: 1 - Symptomatic but completely ambulatory  Blood pressure (!) 152/78, pulse 71, temperature 98.3 F (36.8 C), resp. rate 16, weight 152 lb 9 oz (69.2 kg), SpO2 99 %.  LABORATORY DATA: Lab Results  Component Value Date   WBC 8.9 08/03/2022   HGB 15.0 08/03/2022   HCT 44.7 08/03/2022   MCV 88.5 08/03/2022   PLT 242 08/03/2022      Chemistry      Component Value Date/Time   NA 140  08/03/2022 1452   K 3.8 08/03/2022 1452   CL 103 08/03/2022 1452   CO2 33 (H) 08/03/2022 1452   BUN 17 08/03/2022 1452   CREATININE 0.64 08/03/2022 1452      Component Value Date/Time   CALCIUM 9.6 08/03/2022 1452   ALKPHOS 89 08/03/2022 1452   AST 18 08/03/2022 1452   ALT 14 08/03/2022 1452   BILITOT 0.4 08/03/2022 1452       RADIOGRAPHIC STUDIES: CT Chest W Contrast  Result Date: 08/04/2022 CLINICAL DATA:  Non-small cell lung cancer, staging. * Tracking Code: BO * EXAM: CT CHEST WITH CONTRAST TECHNIQUE: Multidetector CT imaging of the chest was performed during intravenous contrast administration. RADIATION DOSE REDUCTION: This exam was performed according to the departmental dose-optimization  program which includes automated exposure control, adjustment of the mA and/or kV according to patient size and/or use of iterative reconstruction technique. CONTRAST:  171mL OMNIPAQUE IOHEXOL 300 MG/ML  SOLN COMPARISON:  Chest CT 09/30/2021.  PET-CT 10/31/2021. FINDINGS: Cardiovascular: No acute vascular findings. Atherosclerosis of the aorta, great vessels and coronary arteries. The heart size is normal. There is no pericardial effusion. Mediastinum/Nodes: There are no enlarged mediastinal, hilar or axillary lymph nodes.Stable mild distal esophageal wall thickening. The thyroid gland and trachea appear unremarkable. Lungs/Pleura: No pleural effusion or pneumothorax. Interval right upper lobectomy. Mild linear scarring or atelectasis in the right middle and lower lobes. No suspicious pulmonary nodules. Mild underlying centrilobular emphysema. Upper abdomen: The visualized upper abdomen appears stable, without suspicious findings. There are stable low-density hepatic lesions which are likely cysts. Musculoskeletal/Chest wall: There is no chest wall mass or suspicious osseous finding. Mild thoracic scoliosis. IMPRESSION: 1. Interval right upper lobectomy. 2. No evidence of local recurrence or metastatic  disease. 3. Aortic Atherosclerosis (ICD10-I70.0) and Emphysema (ICD10-J43.9). Electronically Signed   By: Richardean Sale M.D.   On: 08/04/2022 11:29    ASSESSMENT AND PLAN: This is a very pleasant 77 years old white female with stage Ia (T1b, N0, M0) non-small cell lung cancer, adenocarcinoma presented with right upper lobe lung nodule status post right upper lobectomy with lymph node sampling under the care of Dr. Kipp Brood on January 07, 2022. The patient is currently on observation and she is feeling fine with no concerning complaints. She had repeat CT scan of the chest performed recently.  I personally and independently reviewed the scan and discussed the results with the patient today. Her scan showed no concerning findings for disease recurrence or metastasis. I recommended for the patient to continue on observation with repeat CT scan of the chest in 6 months. She had several question about the other incidental finding on the scan and I explained to her to her satisfaction. The patient was advised to call immediately if she has any concerning symptoms in the interval. The patient voices understanding of current disease status and treatment options and is in agreement with the current care plan.  All questions were answered. The patient knows to call the clinic with any problems, questions or concerns. We can certainly see the patient much sooner if necessary.   Disclaimer: This note was dictated with voice recognition software. Similar sounding words can inadvertently be transcribed and may not be corrected upon review.

## 2022-08-12 ENCOUNTER — Other Ambulatory Visit: Payer: Self-pay | Admitting: Family Medicine

## 2022-08-12 DIAGNOSIS — R739 Hyperglycemia, unspecified: Secondary | ICD-10-CM

## 2022-08-12 DIAGNOSIS — E785 Hyperlipidemia, unspecified: Secondary | ICD-10-CM

## 2022-08-12 DIAGNOSIS — M8580 Other specified disorders of bone density and structure, unspecified site: Secondary | ICD-10-CM

## 2022-08-21 ENCOUNTER — Other Ambulatory Visit (INDEPENDENT_AMBULATORY_CARE_PROVIDER_SITE_OTHER): Payer: Medicare HMO

## 2022-08-21 DIAGNOSIS — E785 Hyperlipidemia, unspecified: Secondary | ICD-10-CM

## 2022-08-21 DIAGNOSIS — M8580 Other specified disorders of bone density and structure, unspecified site: Secondary | ICD-10-CM

## 2022-08-21 DIAGNOSIS — R739 Hyperglycemia, unspecified: Secondary | ICD-10-CM | POA: Diagnosis not present

## 2022-08-21 LAB — LIPID PANEL
Cholesterol: 175 mg/dL (ref 0–200)
HDL: 59.1 mg/dL (ref >39.00)
LDL Cholesterol: 90 mg/dL (ref 0–99)
NonHDL: 115.7
Total CHOL/HDL Ratio: 3
Triglycerides: 128 mg/dL (ref 0.0–149.0)
VLDL: 25.6 mg/dL (ref 0.0–40.0)

## 2022-08-21 LAB — HEMOGLOBIN A1C: Hgb A1c MFr Bld: 5.7 % (ref 4.6–6.5)

## 2022-08-21 LAB — VITAMIN D 25 HYDROXY (VIT D DEFICIENCY, FRACTURES): VITD: 51.42 ng/mL (ref 30.00–100.00)

## 2022-08-25 ENCOUNTER — Ambulatory Visit (INDEPENDENT_AMBULATORY_CARE_PROVIDER_SITE_OTHER): Payer: Medicare HMO

## 2022-08-25 VITALS — Ht 66.0 in | Wt 150.0 lb

## 2022-08-25 DIAGNOSIS — Z0000 Encounter for general adult medical examination without abnormal findings: Secondary | ICD-10-CM | POA: Diagnosis not present

## 2022-08-25 NOTE — Patient Instructions (Signed)
[name] , Thank you for taking time to come for your Medicare Wellness Visit. I appreciate your ongoing commitment to your health goals. Please review the following plan we discussed and let me know if I can assist you in the future.   These are the goals we discussed:  Goals      Increase physical activity     Starting 08/12/2018, I will continue to exercise for at least 60 minutes 6 days per week.      Patient Stated     08/18/2019, I will continue doing cardio, yoga and weight lifting 3-4 days a week for about 1 hour.      Patient Stated     08/20/2020, I will maintain and continue medications as prescribed.      Patient Stated     Would like to get back to eating healthier and exercising      Patient Stated     Staying cancer-free.        This is a list of the screening recommended for you and due dates:  Health Maintenance  Topic Date Due   Zoster (Shingles) Vaccine (1 of 2) Never done   Colon Cancer Screening  08/15/2018   Mammogram  09/13/2020   COVID-19 Vaccine (4 - 2023-24 season) 02/20/2022   Medicare Annual Wellness Visit  08/25/2023   DTaP/Tdap/Td vaccine (4 - Td or Tdap) 04/28/2025   Pneumonia Vaccine  Completed   DEXA scan (bone density measurement)  Completed   Hepatitis C Screening: USPSTF Recommendation to screen - Ages 31-79 yo.  Completed   HPV Vaccine  Aged Out    Advanced directives: Copy is on file.  Conditions/risks identified: none  Next appointment: Follow up in one year for your annual wellness visit 08/30/2023 @ 10:15 via telephone.   Preventive Care 79 Years and Older, Female Preventive care refers to lifestyle choices and visits with your health care provider that can promote health and wellness. What does preventive care include? A yearly physical exam. This is also called an annual well check. Dental exams once or twice a year. Routine eye exams. Ask your health care provider how often you should have your eyes checked. Personal  lifestyle choices, including: Daily care of your teeth and gums. Regular physical activity. Eating a healthy diet. Avoiding tobacco and drug use. Limiting alcohol use. Practicing safe sex. Taking low-dose aspirin every day. Taking vitamin and mineral supplements as recommended by your health care provider. What happens during an annual well check? The services and screenings done by your health care provider during your annual well check will depend on your age, overall health, lifestyle risk factors, and family history of disease. Counseling  Your health care provider may ask you questions about your: Alcohol use. Tobacco use. Drug use. Emotional well-being. Home and relationship well-being. Sexual activity. Eating habits. History of falls. Memory and ability to understand (cognition). Work and work Statistician. Reproductive health. Screening  You may have the following tests or measurements: Height, weight, and BMI. Blood pressure. Lipid and cholesterol levels. These may be checked every 5 years, or more frequently if you are over 37 years old. Skin check. Lung cancer screening. You may have this screening every year starting at age 59 if you have a 30-pack-year history of smoking and currently smoke or have quit within the past 15 years. Fecal occult blood test (FOBT) of the stool. You may have this test every year starting at age 41. Flexible sigmoidoscopy or colonoscopy. You may  have a sigmoidoscopy every 5 years or a colonoscopy every 10 years starting at age 42. Hepatitis C blood test. Hepatitis B blood test. Sexually transmitted disease (STD) testing. Diabetes screening. This is done by checking your blood sugar (glucose) after you have not eaten for a while (fasting). You may have this done every 1-3 years. Bone density scan. This is done to screen for osteoporosis. You may have this done starting at age 69. Mammogram. This may be done every 1-2 years. Talk to your  health care provider about how often you should have regular mammograms. Talk with your health care provider about your test results, treatment options, and if necessary, the need for more tests. Vaccines  Your health care provider may recommend certain vaccines, such as: Influenza vaccine. This is recommended every year. Tetanus, diphtheria, and acellular pertussis (Tdap, Td) vaccine. You may need a Td booster every 10 years. Zoster vaccine. You may need this after age 43. Pneumococcal 13-valent conjugate (PCV13) vaccine. One dose is recommended after age 21. Pneumococcal polysaccharide (PPSV23) vaccine. One dose is recommended after age 45. Talk to your health care provider about which screenings and vaccines you need and how often you need them. This information is not intended to replace advice given to you by your health care provider. Make sure you discuss any questions you have with your health care provider. Document Released: 07/05/2015 Document Revised: 02/26/2016 Document Reviewed: 04/09/2015 Elsevier Interactive Patient Education  2017 [place] Prevention in the Home Falls can cause injuries. They can happen to people of all ages. There are many things you can do to make your home safe and to help prevent falls. What can I do on the outside of my home? Regularly fix the edges of walkways and driveways and fix any cracks. Remove anything that might make you trip as you walk through a door, such as a raised step or threshold. Trim any bushes or trees on the path to your home. Use bright outdoor lighting. Clear any walking paths of anything that might make someone trip, such as rocks or tools. Regularly check to see if handrails are loose or broken. Make sure that both sides of any steps have handrails. Any raised decks and porches should have guardrails on the edges. Have any leaves, snow, or ice cleared regularly. Use sand or salt on walking paths during winter. Clean  up any spills in your garage right away. This includes oil or grease spills. What can I do in the bathroom? Use night lights. Install grab bars by the toilet and in the tub and shower. Do not use towel bars as grab bars. Use non-skid mats or decals in the tub or shower. If you need to sit down in the shower, use a plastic, non-slip stool. Keep the floor dry. Clean up any water that spills on the floor as soon as it happens. Remove soap buildup in the tub or shower regularly. Attach bath mats securely with double-sided non-slip rug tape. Do not have throw rugs and other things on the floor that can make you trip. What can I do in the bedroom? Use night lights. Make sure that you have a light by your bed that is easy to reach. Do not use any sheets or blankets that are too big for your bed. They should not hang down onto the floor. Have a firm chair that has side arms. You can use this for support while you get dressed. Do not have throw rugs  and other things on the floor that can make you trip. What can I do in the kitchen? Clean up any spills right away. Avoid walking on wet floors. Keep items that you use a lot in easy-to-reach places. If you need to reach something above you, use a strong step stool that has a grab bar. Keep electrical cords out of the way. Do not use floor polish or wax that makes floors slippery. If you must use wax, use non-skid floor wax. Do not have throw rugs and other things on the floor that can make you trip. What can I do with my stairs? Do not leave any items on the stairs. Make sure that there are handrails on both sides of the stairs and use them. Fix handrails that are broken or loose. Make sure that handrails are as long as the stairways. Check any carpeting to make sure that it is firmly attached to the stairs. Fix any carpet that is loose or worn. Avoid having throw rugs at the top or bottom of the stairs. If you do have throw rugs, attach them to the  floor with carpet tape. Make sure that you have a light switch at the top of the stairs and the bottom of the stairs. If you do not have them, ask someone to add them for you. What else can I do to help prevent falls? Wear shoes that: Do not have high heels. Have rubber bottoms. Are comfortable and fit you well. Are closed at the toe. Do not wear sandals. If you use a stepladder: Make sure that it is fully opened. Do not climb a closed stepladder. Make sure that both sides of the stepladder are locked into place. Ask someone to hold it for you, if possible. Clearly mark and make sure that you can see: Any grab bars or handrails. First and last steps. Where the edge of each step is. Use tools that help you move around (mobility aids) if they are needed. These include: Canes. Walkers. Scooters. Crutches. Turn on the lights when you go into a dark area. Replace any light bulbs as soon as they burn out. Set up your furniture so you have a clear path. Avoid moving your furniture around. If any of your floors are uneven, fix them. If there are any pets around you, be aware of where they are. Review your medicines with your doctor. Some medicines can make you feel dizzy. This can increase your chance of falling. Ask your doctor what other things that you can do to help prevent falls. This information is not intended to replace advice given to you by your health care provider. Make sure you discuss any questions you have with your health care provider. Document Released: 04/04/2009 Document Revised: 11/14/2015 Document Reviewed: 07/13/2014 Elsevier Interactive Patient Education  2017 [employer].

## 2022-08-25 NOTE — Progress Notes (Signed)
I connected with  Desiree Ortega on 08/25/22 by a audio enabled telemedicine application and verified that I am speaking with the correct person using two identifiers.  Patient Location: Home  Provider Location: Office/Clinic  I discussed the limitations of evaluation and management by telemedicine. The patient expressed understanding and agreed to proceed.  Subjective:   Desiree Ortega is a 77 y.o. female who presents for Medicare Annual (Subsequent) preventive examination.  Review of Systems      Cardiac Risk Factors include: advanced age (>95mn, >>56women);dyslipidemia     Objective:    Today's Vitals   08/25/22 1023  Weight: 150 lb (68 kg)  Height: '5\' 6"'$  (1.676 m)   Body mass index is 24.21 kg/m.     08/25/2022   10:35 AM 02/02/2022    2:28 PM 01/07/2022    8:00 PM 01/05/2022    1:43 PM 08/21/2021   10:37 AM 08/12/2021    3:20 PM 07/27/2021    6:00 AM  Advanced Directives  Does Patient Have a Medical Advance Directive? Yes No No Yes Yes Yes Yes  Type of AParamedicof ABentonLiving will   [place]Living will [place]ontpelierLiving will HJeffersonLiving will [place]Living will  Does patient want to make changes to medical advance directive? No - Patient declined   No - Patient declined Yes (MAU/Ambulatory/Procedural Areas - Information given) No - Patient declined No - Patient declined  Copy of HWoosterin Chart? Yes - validated most recent copy scanned in chart (See row information)   Yes - validated most recent copy scanned in chart (See row information) Yes - validated most recent copy scanned in chart (See row information)    Would patient like information on creating a medical advance directive?  No - Patient declined No - Patient declined   No - Patient declined No - Patient declined    Current Medications (verified) Outpatient Encounter Medications  as of 08/25/2022  Medication Sig   Ascorbic Acid (VITAMIN C) 1000 MG tablet Take 1 tablet (1,000 mg total) by mouth daily. (Patient taking differently: Take 1,000 mg by mouth in the morning and at bedtime.)   atorvastatin (LIPITOR) 10 MG tablet Take 1 tablet (10 mg total) by mouth daily.   b complex vitamins tablet Take 1 tablet by mouth daily.   Calcium Carbonate (CALCIUM 600 PO) Take 600 mg by mouth daily.   Cholecalciferol (VITAMIN D PO) Take 1,000 Units by mouth daily.   CINNAMON PO Take 2,000 mg by mouth daily.   Cyanocobalamin (B-12) 1000 MCG TBCR Take 2,000 mcg by mouth daily.   fexofenadine (ALLEGRA) 180 MG tablet Take 1 tablet (180 mg total) by mouth as needed. Seasonal allergies from Spring to Fall   Flaxseed, Linseed, (FLAX SEEDS PO) Take 1 capsule by mouth daily.   Magnesium 250 MG TABS Take 250 mg by mouth daily.   Melatonin 10 MG TABS Take 10 mg by mouth at bedtime as needed (sleep).   Multiple Vitamin (MULTIVITAMIN) capsule Take 1 capsule by mouth daily.   Turmeric 500 MG CAPS Take 500 mg by mouth daily.   Ubiquinol 100 MG CAPS Take 100 mg by mouth daily.   Zinc 50 MG CAPS Take 50 mg by mouth daily.   HYDROcodone-acetaminophen (NORCO/VICODIN) 5-325 MG tablet Take 1 tablet by mouth every 6 (six) hours as needed for moderate pain.   Omega-3 Fatty Acids (FISH OIL) 1000  MG CAPS Take 1,000 mg by mouth in the morning and at bedtime. (Patient not taking: Reported on 08/25/2022)   No facility-administered encounter medications on file as of 08/25/2022.    Allergies (verified) Alendronate sodium, Codeine, Influenza vaccines, Tramadol hcl, and Keflex [cephalexin]   History: Past Medical History:  Diagnosis Date   Allergy    Cancer ([place]) on back,leg,tailbone   prev squam and basal cell skin CA removed- Dr. Evorn Gong   Diverticulosis    Moderate   Female bladder prolapse    H/O cold sores    History of colon polyps    History of kidney stones    Hyperlipidemia    Migraine with aura     Osteopenia    prev on fosamax for a few years then started evista at age ~37, T score improved  from -2.29/-1.96 to -1.8/-1.7 as of 06/2011, repeat DXA done 2015   Past Surgical History:  Procedure Laterality Date   BREAST BIOPSY Left    benign   CATARACT EXTRACTION W/ INTRAOCULAR LENS IMPLANT Left 11/2016   CATARACT EXTRACTION W/ INTRAOCULAR LENS IMPLANT Right 12/22/2016   COLONOSCOPY  08/15/2013   cyst on back     2 times   cyst on eyelid     right eye   CYSTOCELE REPAIR N/A 02/01/2018   Procedure: ANTERIOR REPAIR (CYSTOCELE);  Surgeon: Bjorn Loser, MD;  Location: [facility];  Service: Urology;  Laterality: N/A;   CYSTOSCOPY N/A 02/01/2018   Procedure: cystoscopy;  Surgeon: Bjorn Loser, MD;  Location: [facility];  Service: Urology;  Laterality: N/A;   INTERCOSTAL NERVE BLOCK Right 01/07/2022   Procedure: INTERCOSTAL NERVE BLOCK;  Surgeon: Lajuana Matte, MD;  Location: [place] [facility];  Service: Thoracic;  Laterality: Right;   LAPAROSCOPIC VAGINAL HYSTERECTOMY WITH SALPINGO OOPHORECTOMY Bilateral 02/01/2018   Procedure: LAPAROSCOPIC ASSISTED VAGINAL HYSTERECTOMY WITH BILATERAL SALPINGO OOPHORECTOMY;  Surgeon: Servando Salina, MD;  Location: [facility];  Service: Gynecology;  Laterality: Bilateral;   NODE DISSECTION Right 01/07/2022   Procedure: NODE DISSECTION;  Surgeon: Lajuana Matte, MD;  Location: [facility];  Service: Thoracic;  Laterality: Right;   SQUAMOUS CELL CARCINOMA EXCISION     on back/removed 2 times/and thigh   SQUAMOUS CELL CARCINOMA EXCISION Left 07/15/2016   left lower calf   SSC removed  1988   Birthmark removal partial 1/3   TONSILLECTOMY  1950   TUBAL LIGATION  77's   Family History  Problem Relation Age of Onset   Osteopenia Mother    Hypertension Father    Heart disease Father        AFIB, PVD stents LE's, pacer   Stroke Father    Cancer Brother        prostate, prostatectomy, radiation 5 years later, Hormone Tx   Prostate cancer Brother    Heart  disease Brother    Cancer Brother        prostate, prostatectomy   Prostate cancer Brother    Diabetes Other    Cancer Other        Non-Hodgkin's Lymphoma   Depression Daughter    Alcohol abuse Neg Hx    Drug abuse Neg Hx    Colon cancer Neg Hx    Breast cancer Neg Hx    Bladder Cancer Neg Hx    Kidney cancer Neg Hx    Social History   Socioeconomic History   Marital status: Married    Spouse name: Not on file   Number of children: 4  Years of education: Not on file   Highest education level: Not on file  Occupational History   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  Tobacco Use   Smoking status: Former    Packs/day: 0.50    Years: 36.00    Total pack years: 18.00    Types: Cigarettes    Quit date: 06/22/2001    Years since quitting: 21.1   Smokeless tobacco: Never  Vaping Use   Vaping Use: Never used  Substance and Sexual Activity   Alcohol use: Not Currently    Alcohol/week: 2.0 standard drinks of alcohol    Types: 2 Glasses of wine per week    Comment: occasionally   Drug use: No   Sexual activity: Yes    Birth control/protection: Post-menopausal  Other Topics Concern   Not on file  Social History Narrative   Married 1963, lives with husband   [employer] and [employer] fan   Mother in ALF as of 2022 (pt's mother was born in [place])   4 daughters   Taught swimming prev   Social Determinants of Health   Financial Resource Strain: [employer]  (08/25/2022)   Overall Financial Resource Strain (CARDIA)    Difficulty of Paying Living Expenses: Not hard at all  Food Insecurity: No Food Insecurity (08/25/2022)   Hunger Vital Sign    Worried About Running Out of Food in the Last Year: Never true    [place] in the Last Year: Never true  Transportation Needs: No Transportation Needs (08/25/2022)   PRAPARE - Hydrologist (Medical): No    Lack of Transportation (Non-Medical): No  Physical Activity: Sufficiently  Active (08/25/2022)   Exercise Vital Sign    Days of Exercise per Week: 7 days    Minutes of Exercise per Session: 40 min  Stress: No Stress Concern Present (08/25/2022)   [place]    Feeling of Stress : Not at all  Social Connections: Moderately Integrated (08/25/2022)   Social Connection and Isolation Panel [NHANES]    Frequency of Communication with Friends and Family: More than three times a week    Frequency of Social Gatherings with Friends and Family: More than three times a week    Attends Religious Services: More than 4 times per year    Active Member of [employer] or Organizations: No    Attends Music therapist: Never    Marital Status: Married    Tobacco Counseling Counseling given: Not Answered   Clinical Intake:  Pre-visit preparation completed: Yes  Pain : No/denies pain     Nutritional Risks: None Diabetes: No  How often do you need to have someone help you when you read instructions, pamphlets, or other written materials from your doctor or pharmacy?: 1 - Never  Diabetic? no  Interpreter Needed?: No  Information entered by :: C.Dyson Sevey LPN   Activities of Daily Living    08/25/2022   10:36 AM 01/07/2022    8:00 PM  In your present state of health, do you have any difficulty performing the following activities:  Hearing? 0 0  Vision? 0 0  Difficulty concentrating or making decisions? 0 0  Walking or climbing stairs? 0 1  Dressing or bathing? 0 0  Doing errands, shopping? 0 0  Preparing Food and eating ? N   Using the Toilet? N   In the past six months, have you  accidently leaked urine? Y   Comment occasionally when sneezing or coughing   Do you have problems with loss of bowel control? N   Managing your Medications? N   Managing your Finances? N   Housekeeping or managing your Housekeeping? N     Patient Care Team: Tonia Ghent, MD as PCP - General (Family  Medicine) Bryson Ha, OD as Consulting Physician (Optometry) Dasher, Rayvon Char, MD as Consulting Physician (Dermatology) Altha Harm, DDS as Consulting Physician (Dentistry) Earnie Larsson, MD as Consulting Physician (Neurosurgery)  Indicate any recent Medical Services you may have received from other than [facility] providers in the past year (date may be approximate).     Assessment:   This is a routine wellness examination for Lamesha.  Hearing/Vision screen Hearing Screening - Comments:: No aids Vision Screening - Comments:: Wears readers - Dr.Ritter  Dietary issues and exercise activities discussed: Current Exercise Habits: Home exercise routine, Type of exercise: strength training/weights;walking, Time (Minutes): 45, Frequency (Times/Week): 7, Weekly Exercise (Minutes/Week): 315, Intensity: Mild, Exercise limited by: None identified   Goals Addressed             This Visit's Progress    Patient Stated       Staying cancer-free.       Depression Screen    08/25/2022   10:38 AM 08/21/2021   10:39 AM 08/20/2020   10:29 AM 08/18/2019   12:05 PM 08/12/2018   10:11 AM 08/04/2017    9:26 AM 07/16/2016    8:10 AM  PHQ 2/9 Scores  PHQ - 2 Score 0 0 0 0 0 0 0  PHQ- 9 Score   0 0 0 0     Fall Risk    08/25/2022   10:36 AM 08/21/2021   10:38 AM 08/20/2020   10:29 AM 08/18/2019   12:04 PM 08/12/2018   10:11 AM  Fall Risk   Falls in the past year? 0 0 0 0 0  Number falls in past yr: 0 0 0 0   Injury with Fall? 0 0 0 0   Risk for fall due to : No Fall Risks No Fall Risks No Fall Risks No Fall Risks   Follow up Falls prevention discussed;Falls evaluation completed Falls prevention discussed Falls evaluation completed;Falls prevention discussed Falls evaluation completed;Falls prevention discussed     FALL RISK PREVENTION PERTAINING TO THE HOME:  Any stairs in or around the home? Yes  If so, are there any without handrails? No  Home free of loose throw rugs in walkways, pet beds,  electrical cords, etc? Yes  Adequate lighting in your home to reduce risk of falls? No   ASSISTIVE DEVICES UTILIZED TO PREVENT FALLS:  Life alert? No  Use of a cane, walker or w/c? No  Grab bars in the bathroom? Yes  Shower chair or bench in shower? No  Elevated toilet seat or a handicapped toilet? Yes    Cognitive Function:    08/20/2020   10:32 AM 08/18/2019   12:07 PM 08/12/2018   10:10 AM 08/04/2017    9:41 AM 07/16/2016    8:10 AM  MMSE - Mini Mental State Exam  Orientation to time '5 5 5 5 5  '$ Orientation to Place '5 5 5 5 5  '$ Registration '3 3 3 3 3  '$ Attention/ Calculation 5 5 0 0 0  Recall '3 3 3 2 3  '$ Recall-comments    unable to recall 1 of 3 words   Language- name 2 objects  0 0 0  Language- repeat '1 1 1 1 1  '$ Language- follow 3 step command   '3 3 3  '$ Language- read & follow direction   0 0 0  Write a sentence   0 0 0  Copy design   0 0 0  Total score   '20 19 20        '$ 08/25/2022   10:39 AM  6CIT Screen  What Year? 0 points  What month? 0 points  What time? 0 points  Count back from 20 0 points  Months in reverse 0 points  Repeat phrase 4 points  Total Score 4 points    Immunizations Immunization History  Administered Date(s) Administered   Influenza, Seasonal, Injecte, Preservative Fre 07/01/2012   PFIZER(Purple Top)SARS-COV-2 Vaccination 09/05/2019, 09/26/2019, 03/27/2020   Pneumococcal Conjugate-13 07/15/2015   Pneumococcal Polysaccharide-23 06/25/2011   Td 11/20/1997, 06/11/2008   Tdap 04/29/2015    TDAP status: Up to date  Flu Vaccine status: Declined, Education has been provided regarding the importance of this vaccine but patient still declined. Advised may receive this vaccine at local pharmacy or Health Dept. Aware to provide a copy of the vaccination record if obtained from local pharmacy or Health Dept. Verbalized acceptance and understanding.  Pneumococcal vaccine status: Up to date  Covid-19 vaccine status: Declined, Education has been  provided regarding the importance of this vaccine but patient still declined. Advised may receive this vaccine at local pharmacy or Health Dept.or vaccine clinic. Aware to provide a copy of the vaccination record if obtained from local pharmacy or Health Dept. Verbalized acceptance and understanding.  Qualifies for Shingles Vaccine? No   Zostavax completed No   Shingrix Completed?: No.    Education has been provided regarding the importance of this vaccine. Patient has been advised to call insurance company to determine out of pocket expense if they have not yet received this vaccine. Advised may also receive vaccine at local pharmacy or Health Dept. Verbalized acceptance and understanding.  Screening Tests Health Maintenance  Topic Date Due   Zoster Vaccines- Shingrix (1 of 2) Never done   COLONOSCOPY (Pts 45-71yr Insurance coverage will need to be confirmed)  08/15/2018   MAMMOGRAM  09/13/2020   COVID-19 Vaccine (4 - 2023-24 season) 02/20/2022   Medicare Annual Wellness (AWV)  08/25/2023   DTaP/Tdap/Td (4 - Td or Tdap) 04/28/2025   Pneumonia Vaccine 77 Years old  Completed   DEXA SCAN  Completed   Hepatitis C Screening  Completed   HPV VACCINES  Aged Out    Health Maintenance  Health Maintenance Due  Topic Date Due   Zoster Vaccines- Shingrix (1 of 2) Never done   COLONOSCOPY (Pts 45-471yrInsurance coverage will need to be confirmed)  08/15/2018   MAMMOGRAM  09/13/2020   COVID-19 Vaccine (4 - 2023-24 season) 02/20/2022    Colorectal cancer screening: No longer required.   Mammogram status: No longer required due to age.  Bone Density status: Completed 08/22/2013. Results reflect: Bone density results: OSTEOPENIA. Repeat every 2 years.  Lung Cancer Screening: (Low Dose CT Chest recommended if Age 729-80ears, 30 pack-year currently smoking OR have quit w/in 15years.) does not qualify.   Lung Cancer Screening Referral: no  Additional Screening:  Hepatitis C Screening:  does not qualify; Completed 07/16/16  Vision Screening: Recommended annual ophthalmology exams for early detection of glaucoma and other disorders of the eye. Is the patient up to date with their annual eye exam?  Yes  Who is  the provider or what is the name of the office in which the patient attends annual eye exams? Dr.Ritter If pt is not established with a provider, would they like to be referred to a provider to establish care? No .   Dental Screening: Recommended annual dental exams for proper oral hygiene  Community Resource Referral / Chronic Care Management: CRR required this visit?  No   CCM required this visit?  No      Plan:     I have personally reviewed and noted the following in the patient's chart:   Medical and social history Use of alcohol, tobacco or illicit drugs  Current medications and supplements including opioid prescriptions. Patient is not currently taking opioid prescriptions. Functional ability and status Nutritional status Physical activity Advanced directives List of other physicians Hospitalizations, surgeries, and ER visits in previous 12 months Vitals Screenings to include cognitive, depression, and falls Referrals and appointments  In addition, I have reviewed and discussed with patient certain preventive protocols, quality metrics, and best practice recommendations. A written personalized care plan for preventive services as well as general preventive health recommendations were provided to patient.     Lebron Conners, LPN   [id number]   Nurse Notes: Vaccinations: declines  Influenza vaccine: recommend every Fall Shingles vaccine: recommend Shingrix which is 2 doses 2-6 months apart and over 90% effective     Covid-19: recommend 2 doses one month apart with a booster 6 months later

## 2022-08-28 ENCOUNTER — Encounter: Payer: Self-pay | Admitting: Family Medicine

## 2022-08-28 ENCOUNTER — Ambulatory Visit (INDEPENDENT_AMBULATORY_CARE_PROVIDER_SITE_OTHER): Payer: Medicare HMO | Admitting: Family Medicine

## 2022-08-28 VITALS — BP 118/56 | HR 79 | Temp 97.5°F | Ht 66.0 in | Wt 151.0 lb

## 2022-08-28 DIAGNOSIS — Z1211 Encounter for screening for malignant neoplasm of colon: Secondary | ICD-10-CM

## 2022-08-28 DIAGNOSIS — C3490 Malignant neoplasm of unspecified part of unspecified bronchus or lung: Secondary | ICD-10-CM

## 2022-08-28 DIAGNOSIS — Z0000 Encounter for general adult medical examination without abnormal findings: Secondary | ICD-10-CM

## 2022-08-28 DIAGNOSIS — Z7189 Other specified counseling: Secondary | ICD-10-CM

## 2022-08-28 DIAGNOSIS — E785 Hyperlipidemia, unspecified: Secondary | ICD-10-CM

## 2022-08-28 MED ORDER — VITAMIN C 1000 MG PO TABS: 1000.0000 mg | ORAL_TABLET | Freq: Two times a day (BID) | ORAL | Status: AC

## 2022-08-28 MED ORDER — ATORVASTATIN CALCIUM 10 MG PO TABS: 10.0000 mg | ORAL_TABLET | Freq: Every day | ORAL | 3 refills | Status: DC

## 2022-08-28 NOTE — Progress Notes (Signed)
Elevated Cholesterol: Using medications without problems: yes Muscle aches: no Diet compliance: yes Exercise: yes Off omega 3 in the meantime.  She was asking about bleeding risk.  Discussed not continuing that given that she was taking lipitor.    Her mother is 101, in ALF.  D/w pt.    Vaccines d/w pt.   DXA d/w pt.  She'll can consider.   Mammogram to be done when possible.   Colonoscopy d/w pt.  Referred 2024.   Living will d/w pt.  Husband would be designated if she were incapacitated.  If husband unavailable, then daughter Dorie Rank (then daughter Jearld Lesch) designated if needed  H/o lung cancer.  On observation. Followed by oncology.   Most recent CT scan showed no concerning findings for disease recurrence or metastasis.   PMH and SH reviewed  Meds, vitals, and allergies reviewed.   ROS: Per HPI unless specifically indicated in ROS section   GEN: nad, alert and oriented HEENT: ncat NECK: supple w/o LA CV: rrr. PULM: ctab, no inc wob ABD: soft, +bs EXT: no edema SKIN: no acute rash  30 minutes were devoted to patient care in this encounter (this includes time spent reviewing the patient's file/history, interviewing and examining the patient, counseling/reviewing plan with patient).

## 2022-08-28 NOTE — Patient Instructions (Addendum)
Check on shingrix and RSV.   Ask pharmacy about options.   Take care.  Glad to see you.  You can call for a mammogram at [facility] at [facility].  [place]  Update me as needed.  Let me know if you don't get a call about seeing GI.

## 2022-08-30 NOTE — Assessment & Plan Note (Signed)
Vaccines d/w pt.   DXA d/w pt.  She'll can consider.   Mammogram to be done when possible.   Colonoscopy d/w pt.  Referred 2024.   Living will d/w pt.  Husband would be designated if she were incapacitated.  If husband unavailable, then daughter Gypsy Decant (then daughter Malon Kindle) designated if needed

## 2022-08-30 NOTE — Assessment & Plan Note (Addendum)
Continue atorvastatin, continue work on diet and exercise.  Labs d/w pt.

## 2022-08-30 NOTE — Assessment & Plan Note (Signed)
H/o lung cancer.  On observation. Followed by oncology.   Most recent CT scan showed no concerning findings for disease recurrence or metastasis.

## 2022-08-30 NOTE — Assessment & Plan Note (Signed)
Living will d/w pt.  Husband would be designated if she were incapacitated.  If husband unavailable, then daughter Gypsy Decant (then daughter Malon Kindle) designated if needed

## 2022-08-31 ENCOUNTER — Other Ambulatory Visit: Payer: Self-pay | Admitting: Family Medicine

## 2022-08-31 DIAGNOSIS — Z1231 Encounter for screening mammogram for malignant neoplasm of breast: Secondary | ICD-10-CM

## 2022-09-06 ENCOUNTER — Encounter: Payer: Self-pay | Admitting: Family Medicine

## 2022-09-08 ENCOUNTER — Other Ambulatory Visit: Payer: Self-pay | Admitting: Family Medicine

## 2022-09-08 DIAGNOSIS — E2839 Other primary ovarian failure: Secondary | ICD-10-CM

## 2022-09-16 ENCOUNTER — Ambulatory Visit
Admission: RE | Admit: 2022-09-16 | Discharge: 2022-09-16 | Disposition: A | Discharge status: Home / Self Care | Payer: Medicare HMO | Source: Ambulatory Visit | Attending: Family Medicine | Admitting: Family Medicine

## 2022-09-16 DIAGNOSIS — Z1231 Encounter for screening mammogram for malignant neoplasm of breast: Secondary | ICD-10-CM | POA: Diagnosis not present

## 2022-10-06 ENCOUNTER — Ambulatory Visit: Payer: Medicare HMO | Admitting: Family Medicine

## 2022-10-14 ENCOUNTER — Telehealth: Payer: Self-pay | Admitting: *Deleted

## 2022-10-14 NOTE — Telephone Encounter (Signed)
Noted  

## 2022-10-14 NOTE — Telephone Encounter (Signed)
Ok for previsit and procedure, if issues identified during previsit then please let me know and we can schedule OV

## 2022-10-14 NOTE — Telephone Encounter (Signed)
Pt.scheduled for colonoscopy on 11/13/22 please review chart and advise if pt. Need OV OR proceed with scheduled procedure?

## 2022-10-19 ENCOUNTER — Encounter: Payer: Self-pay | Admitting: Family Medicine

## 2022-10-19 ENCOUNTER — Ambulatory Visit (INDEPENDENT_AMBULATORY_CARE_PROVIDER_SITE_OTHER): Payer: Medicare HMO | Admitting: Family Medicine

## 2022-10-19 VITALS — BP 128/76 | HR 71 | Temp 97.2°F | Ht 66.0 in | Wt 151.0 lb

## 2022-10-19 DIAGNOSIS — G43109 Migraine with aura, not intractable, without status migrainosus: Secondary | ICD-10-CM

## 2022-10-19 MED ORDER — METHYLPREDNISOLONE ACETATE 80 MG/ML IJ SUSP
80.0000 mg | Freq: Once | INTRAMUSCULAR | Status: AC
  Administered 2022-10-19: 80 mg via INTRAMUSCULAR

## 2022-10-19 NOTE — Progress Notes (Unsigned)
Headache going on for about 10 days.  Had been taking tylenol.  Pain up the back of the neck initially but then pain on the top of the head, now with pain at the eyes.  Tylenol would usually help some, but it wouldn't totally resolve.  H/o migraines- but not having them frequently.  She had an aura initially, "like crinkly saran wrap" in the peripheral vision.  Photophobia noted.  No nausea.  No vomiting.  She drove today.    Prev relief with Depo medrol 80mg  IM.  Used rarely.  Prev helped w/o ADE on med.   Meds, vitals, and allergies reviewed.   ROS: Per HPI unless specifically indicated in ROS section   GEN: nad, alert and oriented HEENT: NCAT NECK: supple w/o LA CV: rrr.  PULM: ctab, no inc wob ABD: soft, +bs EXT: no edema SKIN well perfused.  CN 2-12 wnl B, S/S wnl x4 Felt better sitting in a dark room.

## 2022-10-19 NOTE — Assessment & Plan Note (Signed)
See above

## 2022-10-19 NOTE — Patient Instructions (Signed)
DEPO-MEDROL injection 80 mg Rest in the meantime and let me know if not improving.  Take care.  Glad to see you.

## 2022-10-20 ENCOUNTER — Ambulatory Visit
Admission: RE | Admit: 2022-10-20 | Discharge: 2022-10-20 | Disposition: A | Discharge status: Home / Self Care | Payer: Medicare HMO | Source: Ambulatory Visit | Attending: Family Medicine | Admitting: Family Medicine

## 2022-10-20 DIAGNOSIS — E2839 Other primary ovarian failure: Secondary | ICD-10-CM | POA: Diagnosis not present

## 2022-10-20 DIAGNOSIS — M810 Age-related osteoporosis without current pathological fracture: Secondary | ICD-10-CM | POA: Diagnosis not present

## 2022-10-23 ENCOUNTER — Ambulatory Visit (AMBULATORY_SURGERY_CENTER): Payer: Medicare HMO | Admitting: *Deleted

## 2022-10-23 ENCOUNTER — Encounter: Payer: Self-pay | Admitting: Internal Medicine

## 2022-10-23 VITALS — Ht 66.0 in | Wt 152.0 lb

## 2022-10-23 DIAGNOSIS — Z86010 Personal history of colonic polyps: Secondary | ICD-10-CM

## 2022-10-23 MED ORDER — NA SULFATE-K SULFATE-MG SULF 17.5-3.13-1.6 GM/177ML PO SOLN: 1.0000 | Freq: Once | ORAL | 0 refills | Status: AC

## 2022-10-23 NOTE — Progress Notes (Signed)

## 2022-11-03 ENCOUNTER — Encounter: Payer: Self-pay | Admitting: Family Medicine

## 2022-11-03 ENCOUNTER — Ambulatory Visit (INDEPENDENT_AMBULATORY_CARE_PROVIDER_SITE_OTHER): Payer: Medicare HMO | Admitting: Family Medicine

## 2022-11-03 VITALS — BP 132/70 | HR 75 | Temp 97.6°F | Ht 66.0 in | Wt 152.0 lb

## 2022-11-03 DIAGNOSIS — G43109 Migraine with aura, not intractable, without status migrainosus: Secondary | ICD-10-CM

## 2022-11-03 DIAGNOSIS — M810 Age-related osteoporosis without current pathological fracture: Secondary | ICD-10-CM | POA: Diagnosis not present

## 2022-11-03 MED ORDER — ALENDRONATE SODIUM 70 MG PO TABS
70.0000 mg | ORAL_TABLET | ORAL | 3 refills | Status: DC
Start: 2022-11-03 — End: 2023-08-30

## 2022-11-03 NOTE — Patient Instructions (Signed)
Restart fosamax and let me know if you have any trouble.  With water on empty stomach, don't eat for 1 hour, stay upright.  Take care.  Glad to see you.

## 2022-11-03 NOTE — Progress Notes (Unsigned)
Osteoporosis d/w pt.   prev on fosamax for a few years then started evista at age ~39, T score improved from -2.29/-1.96 to -1.8/-1.7 as of 06/2011. Off evista as of 06/2011, repeat DXA done 2015.  She had one episode of vomiting after taking fosamax- she ate quickly after the dose.  Otherwise she had tolerated Fosamax for an extended period of time.  D/w pt about DXA results and osteoporosis path/phys in general, including vit D and calcium.  Reasonable to consider retreatment with bisphosphonate, ie fosamax.  D/w pt about risk benefit, especially GI sx, jaw and long bone pathology.    D/w pt about migraine with aura.  She had another event that resolved with tylenol.  She has "crinkly" changes in vision in the periphery as an aura.  If inc in frequency, then she'll update [place].  Meds, vitals, and allergies reviewed.   ROS: Per HPI unless specifically indicated in ROS section   Nad Ncat rrr  30 minutes were devoted to patient care in this encounter (this includes time spent reviewing the patient's file/history, interviewing and examining the patient, counseling/reviewing plan with patient).

## 2022-11-04 NOTE — Assessment & Plan Note (Signed)
See above.  She will update me if she has more symptoms.  Discussed migraine with aura.  Discussed other abortive versus prophylactic medications.

## 2022-11-04 NOTE — Assessment & Plan Note (Signed)
See above about osteoporosis pathophysiology discussion and routine caution regarding bisphosphonates.  Discussed possible treatment:  No new meds.  Fosamax Other injection bisphosphonate Evista Prolia  Reasonable to restart Fosamax at this point.  Fosamax cautions discussed with patient.  Continue weightbearing activity and taking vitamin D.  She can update me if not tolerated.

## 2022-11-13 ENCOUNTER — Ambulatory Visit (AMBULATORY_SURGERY_CENTER): Payer: Medicare HMO | Admitting: Internal Medicine

## 2022-11-13 ENCOUNTER — Encounter: Payer: Self-pay | Admitting: Internal Medicine

## 2022-11-13 VITALS — BP 133/58 | HR 64 | Temp 96.8°F | Resp 17 | Ht 66.0 in | Wt 152.0 lb

## 2022-11-13 DIAGNOSIS — Z09 Encounter for follow-up examination after completed treatment for conditions other than malignant neoplasm: Secondary | ICD-10-CM

## 2022-11-13 DIAGNOSIS — Z86010 Personal history of colonic polyps: Secondary | ICD-10-CM | POA: Diagnosis not present

## 2022-11-13 DIAGNOSIS — D123 Benign neoplasm of transverse colon: Secondary | ICD-10-CM

## 2022-11-13 DIAGNOSIS — D124 Benign neoplasm of descending colon: Secondary | ICD-10-CM

## 2022-11-13 DIAGNOSIS — F419 Anxiety disorder, unspecified: Secondary | ICD-10-CM | POA: Diagnosis not present

## 2022-11-13 DIAGNOSIS — K635 Polyp of colon: Secondary | ICD-10-CM | POA: Diagnosis not present

## 2022-11-13 DIAGNOSIS — E785 Hyperlipidemia, unspecified: Secondary | ICD-10-CM | POA: Diagnosis not present

## 2022-11-13 MED ORDER — SODIUM CHLORIDE 0.9 % IV SOLN
500.0000 mL | INTRAVENOUS | Status: DC
Start: 2022-11-13 — End: 2022-11-13

## 2022-11-13 NOTE — Patient Instructions (Signed)
Thank you for coming in to see [place] today! Resume previous diet and medications today. Return to regular daily activities  tomorrow. Polyp biopsy results will be complete in 1-2 weeks. No further routine colonoscopies recommended based on current age guidelines.   YOU HAD AN ENDOSCOPIC PROCEDURE TODAY AT THE [facility]:   Refer to the procedure report that was given to you for any specific questions about what was found during the examination.  If the procedure report does not answer your questions, please call your gastroenterologist to clarify.  If you requested that your care partner not be given the details of your procedure findings, then the procedure report has been included in a sealed envelope for you to review at your convenience later.  YOU SHOULD EXPECT: Some feelings of bloating in the abdomen. Passage of more gas than usual.  Walking can help get rid of the air that was put into your GI tract during the procedure and reduce the bloating. If you had a lower endoscopy (such as a colonoscopy or flexible sigmoidoscopy) you may notice spotting of blood in your stool or on the toilet paper. If you underwent a bowel prep for your procedure, you may not have a normal bowel movement for a few days.  Please Note:  You might notice some irritation and congestion in your nose or some drainage.  This is from the oxygen used during your procedure.  There is no need for concern and it should clear up in a day or so.  SYMPTOMS TO REPORT IMMEDIATELY:  Following lower endoscopy (colonoscopy or flexible sigmoidoscopy):  Excessive amounts of blood in the stool  Significant tenderness or worsening of abdominal pains  Swelling of the abdomen that is new, acute  Fever of 100F or higher    For urgent or emergent issues, a gastroenterologist can be reached at any hour by calling [phone]. Do not use MyChart messaging for urgent concerns.    DIET:  We do recommend a small meal at  first, but then you may proceed to your regular diet.  Drink plenty of fluids but you should avoid alcoholic beverages for 24 hours.  ACTIVITY:  You should plan to take it easy for the rest of today and you should NOT DRIVE or use heavy machinery until tomorrow (because of the sedation medicines used during the test).    FOLLOW UP: Our staff will call the number listed on your records the next business day following your procedure.  We will call around 7:15- 8:00 am to check on you and address any questions or concerns that you may have regarding the information given to you following your procedure. If we do not reach you, we will leave a message.     If any biopsies were taken you will be contacted by phone or by letter within the next 1-3 weeks.  Please call us at [phone] if you have not heard about the biopsies in 3 weeks.    SIGNATURES/CONFIDENTIALITY: You and/or your care partner have signed paperwork which will be entered into your electronic medical record.  These signatures attest to the fact that that the information above on your After Visit Summary has been reviewed and is understood.  Full responsibility of the confidentiality of this discharge information lies with you and/or your care-partner.

## 2022-11-13 NOTE — Progress Notes (Signed)
Sedate, gd SR, tolerated procedure well, VSS, report to RN 

## 2022-11-13 NOTE — Op Note (Signed)
[facility] Patient Name: Desiree Ortega Procedure Date: 11/13/2022 9:59 AM MRN: [id number] Endoscopist: Beverley Fiedler , MD, [id number] Age: 77 Referring MD:  Date of Birth: 1945/12/12 Gender: Female Account #: [id number] Procedure:                Colonoscopy Indications:              High risk colon cancer surveillance: Personal                            history of non-advanced adenomas, Last colonoscopy:                            March 2015 (2 TAs) Medicines:                Monitored Anesthesia Care Procedure:                Pre-Anesthesia Assessment:                           - Prior to the procedure, a History and Physical                            was performed, and patient medications and                            allergies were reviewed. The patient's tolerance of                            previous anesthesia was also reviewed. The risks                            and benefits of the procedure and the sedation                            options and risks were discussed with the patient.                            All questions were answered, and informed consent                            was obtained. Prior Anticoagulants: The patient has                            taken no anticoagulant or antiplatelet agents. ASA                            Grade Assessment: II - A patient with mild systemic                            disease. After reviewing the risks and benefits,                            the patient was deemed in satisfactory condition to  undergo the procedure.                           After obtaining informed consent, the colonoscope                            was passed under direct vision. Throughout the                            procedure, the patient's blood pressure, pulse, and                            oxygen saturations were monitored continuously. The                            Olympus PCF-H190DL (#[id number]) Colonoscope  was                            introduced through the anus and advanced to the                            cecum, identified by appendiceal orifice and                            ileocecal valve. The colonoscopy was performed                            without difficulty. The patient tolerated the                            procedure well. The quality of the bowel                            preparation was good. The ileocecal valve,                            appendiceal orifice, and rectum were photographed. Scope In: 10:02:47 AM Scope Out: 10:19:36 AM Scope Withdrawal Time: 0 hours 9 minutes 0 seconds  Total Procedure Duration: 0 hours 16 minutes 49 seconds  Findings:                 The digital rectal exam was normal.                           Two sessile polyps were found in the transverse                            colon. The polyps were 2 to 3 mm in size. These                            polyps were removed with a cold snare. Resection                            and retrieval were complete.  A 3 mm polyp was found in the descending colon. The                            polyp was sessile. The polyp was removed with a                            cold snare. Resection and retrieval were complete.                           Multiple large-mouthed, medium-mouthed and                            small-mouthed diverticula were found in the sigmoid                            colon, descending colon and ascending colon.                           Internal hemorrhoids were found during                            retroflexion. The hemorrhoids were small. Complications:            No immediate complications. Estimated Blood Loss:     Estimated blood loss: none. Impression:               - Two 2 to 3 mm polyps in the transverse colon,                            removed with a cold snare. Resected and retrieved.                           - One 3 mm polyp in the descending  colon, removed                            with a cold snare. Resected and retrieved.                           - Severe diverticulosis in the sigmoid colon, in                            the descending colon and in the ascending colon.                           - Internal hemorrhoids. Recommendation:           - Patient has a contact number available for                            emergencies. The signs and symptoms of potential                            delayed complications were discussed with the  patient. Return to normal activities tomorrow.                            Written discharge instructions were provided to the                            patient.                           - Resume previous diet.                           - Continue present medications.                           - Await pathology results.                           - No repeat colonoscopy due to age at next                            surveillance interval and absence of advanced                            polyps today. Beverley Fiedler, MD 11/13/2022 10:23:01 AM This report has been signed electronically.

## 2022-11-13 NOTE — Progress Notes (Signed)
GASTROENTEROLOGY PROCEDURE H&P NOTE   Primary Care Physician: Joaquim Nam, MD    Reason for Procedure:  History of adenomatous colon polyps  Plan:    Surveillance colonoscopy  Patient is appropriate for endoscopic procedure(s) in the ambulatory ([facility]) setting.  The nature of the procedure, as well as the risks, benefits, and alternatives were carefully and thoroughly reviewed with the patient. Ample time for discussion and questions allowed. The patient understood, was satisfied, and agreed to proceed.     HPI: Desiree Ortega is a 77 y.o. female who presents for surveillance colonoscopy.  Medical history as below.  Tolerated the prep.  No recent chest pain or shortness of breath.  No abdominal pain today.  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 Adolphus Birchwood   Cataract    BILATERAL,REMOVED   Diverticulosis    Moderate   Female bladder prolapse    H/O cold sores    History of colon polyps    History of kidney stones    Hyperlipidemia    Migraine with aura    Osteopenia    prev on fosamax for a few years then started evista at age ~24, T score improved  from -2.29/-1.96 to -1.8/-1.7 as of 06/2011, repeat DXA done 2015   Osteoporosis     Past Surgical History:  Procedure Laterality Date   BREAST BIOPSY Left    benign   CATARACT EXTRACTION W/ INTRAOCULAR LENS IMPLANT Left 11/2016   CATARACT EXTRACTION W/ INTRAOCULAR LENS IMPLANT Right 12/22/2016   COLONOSCOPY  08/15/2013   cyst on back     2 times   cyst on eyelid     right eye   CYSTOCELE REPAIR N/A 02/01/2018   Procedure: ANTERIOR REPAIR (CYSTOCELE);  Surgeon: Alfredo Martinez, MD;  Location: [facility];  Service: Urology;  Laterality: N/A;   CYSTOSCOPY N/A 02/01/2018   Procedure: cystoscopy;  Surgeon: Alfredo Martinez, MD;  Location: [facility];  Service: Urology;  Laterality: N/A;   INTERCOSTAL NERVE BLOCK  Right 01/07/2022   Procedure: INTERCOSTAL NERVE BLOCK;  Surgeon: Corliss Skains, MD;  Location: [facility];  Service: Thoracic;  Laterality: Right;   LAPAROSCOPIC VAGINAL HYSTERECTOMY WITH SALPINGO OOPHORECTOMY Bilateral 02/01/2018   Procedure: LAPAROSCOPIC ASSISTED VAGINAL HYSTERECTOMY WITH BILATERAL SALPINGO OOPHORECTOMY;  Surgeon: Maxie Better, MD;  Location: [facility];  Service: Gynecology;  Laterality: Bilateral;   LUNG LOBECTOMY     status post right upper lobectomy with lymph node dissection 01/07/2022.   NODE DISSECTION Right 01/07/2022   Procedure: NODE DISSECTION;  Surgeon: Corliss Skains, MD;  Location: [facility];  Service: Thoracic;  Laterality: Right;   SQUAMOUS CELL CARCINOMA EXCISION     on back/removed 2 times/and thigh   SQUAMOUS CELL CARCINOMA EXCISION Left 07/15/2016   left lower calf   SSC removed  1988   Birthmark removal partial 1/3   TONSILLECTOMY  1950   TUBAL LIGATION  1980's    Prior to Admission medications   Medication Sig Start Date End Date Taking? Authorizing Provider  alendronate (FOSAMAX) 70 MG tablet Take 1 tablet (70 mg total) by mouth every 7 (seven) days. Take with a full glass of water on an empty stomach. 11/03/22  Yes Joaquim Nam, MD  Ascorbic Acid (VITAMIN C) 1000 MG tablet Take 1 tablet (1,000 mg total) by mouth in the morning and at bedtime.  08/28/22  Yes Joaquim Nam, MD  atorvastatin (LIPITOR) 10 MG tablet Take 1 tablet (10 mg total) by mouth daily. 08/28/22  Yes Joaquim Nam, MD  b complex vitamins tablet Take 1 tablet by mouth daily.   Yes [provider]  Calcium Carbonate (CALCIUM 600 PO) Take 600 mg by mouth daily.   Yes [provider]  Cholecalciferol (VITAMIN D PO) Take 2,000 Units by mouth daily.   Yes [provider]  CINNAMON PO Take 2,000 mg by mouth daily.   Yes [provider]  Cyanocobalamin (B-12) 1000 MCG TBCR Take 2,000 mcg by mouth daily. 12/15/21  Yes Joaquim Nam, MD   fexofenadine (ALLEGRA) 180 MG tablet Take 1 tablet (180 mg total) by mouth as needed. Seasonal allergies from Spring to Fall 08/21/18  Yes Joaquim Nam, MD  Magnesium 250 MG TABS Take 250 mg by mouth daily.   Yes [provider]  Melatonin 10 MG TABS Take 10 mg by mouth at bedtime as needed (sleep).   Yes [provider]  Multiple Vitamin (MULTIVITAMIN) capsule Take 1 capsule by mouth daily. 08/21/18  Yes Joaquim Nam, MD  Turmeric 500 MG CAPS Take 500 mg by mouth daily.   Yes [provider]  Ubiquinol 100 MG CAPS Take 100 mg by mouth daily. 08/21/18  Yes Joaquim Nam, MD  Zinc 50 MG CAPS Take 50 mg by mouth daily.   Yes [provider]    Current Outpatient Medications  Medication Sig Dispense Refill   alendronate (FOSAMAX) 70 MG tablet Take 1 tablet (70 mg total) by mouth every 7 (seven) days. Take with a full glass of water on an empty stomach. 13 tablet 3   Ascorbic Acid (VITAMIN C) 1000 MG tablet Take 1 tablet (1,000 mg total) by mouth in the morning and at bedtime.     atorvastatin (LIPITOR) 10 MG tablet Take 1 tablet (10 mg total) by mouth daily. 90 tablet 3   b complex vitamins tablet Take 1 tablet by mouth daily.     Calcium Carbonate (CALCIUM 600 PO) Take 600 mg by mouth daily.     Cholecalciferol (VITAMIN D PO) Take 2,000 Units by mouth daily.     CINNAMON PO Take 2,000 mg by mouth daily.     Cyanocobalamin (B-12) 1000 MCG TBCR Take 2,000 mcg by mouth daily.     fexofenadine (ALLEGRA) 180 MG tablet Take 1 tablet (180 mg total) by mouth as needed. Seasonal allergies from Spring to Fall     Magnesium 250 MG TABS Take 250 mg by mouth daily.     Melatonin 10 MG TABS Take 10 mg by mouth at bedtime as needed (sleep).     Multiple Vitamin (MULTIVITAMIN) capsule Take 1 capsule by mouth daily.     Turmeric 500 MG CAPS Take 500 mg by mouth daily.     Ubiquinol 100 MG CAPS Take 100 mg by mouth daily.     Zinc 50 MG CAPS Take 50 mg by mouth daily.      Current Facility-Administered Medications  Medication Dose Route Frequency Provider Last Rate Last Admin   0.9 %  sodium chloride infusion  500 mL Intravenous Continuous Macklin Jacquin, Carie Caddy, MD        Allergies as of 11/13/2022 - Review Complete 11/13/2022  Allergen Reaction Noted   Codeine Nausea Only 04/27/2007   Influenza vaccines  07/10/2013   Tramadol hcl Nausea Only 01/12/2022   Keflex [cephalexin] Rash  07/18/2021    Family History  Problem Relation Age of Onset   Osteopenia Mother    Hypertension Father    Heart disease Father        AFIB, PVD stents LE's, pacer   Stroke Father    Cancer Brother        prostate, prostatectomy, radiation 5 years later, Hormone Tx   Prostate cancer Brother    Heart disease Brother    Cancer Brother        prostate, prostatectomy   Prostate cancer Brother    Lung cancer Brother    Colon polyps Maternal Uncle    Depression Daughter    Diabetes Other    Cancer Other        Non-Hodgkin's Lymphoma   Alcohol abuse Neg Hx    Drug abuse Neg Hx    Colon cancer Neg Hx    Breast cancer Neg Hx    Bladder Cancer Neg Hx    Kidney cancer Neg Hx    Crohn's disease Neg Hx    Esophageal cancer Neg Hx    Rectal cancer Neg Hx    Stomach cancer Neg Hx    Ulcerative colitis Neg Hx     Social History   Socioeconomic History   Marital status: Married    Spouse name: Not on file   Number of children: 4   Years of education: Not on file   Highest education level: 12th grade  Occupational History   Occupation: [employer], [employer]    Comment: Teaches swimming  Tobacco Use   Smoking status: Former    Packs/day: 0.50    Years: 36.00    Additional pack years: 0.00    Total pack years: 18.00    Types: Cigarettes    Quit date: 06/22/2001    Years since quitting: 21.4   Smokeless tobacco: Never  Vaping Use   Vaping Use: Never used  Substance and Sexual Activity   Alcohol use: Not Currently    Alcohol/week: 2.0 standard drinks of  alcohol    Types: 2 Glasses of wine per week    Comment: occasionally   Drug use: No   Sexual activity: Yes    Birth control/protection: Post-menopausal  Other Topics Concern   Not on file  Social History Narrative   Married 1963, lives with husband   Cubs and Bears fan   Mother in ALF as of 2024 (pt's mother was born in [place])   4 daughters   Taught swimming prev   Social Determinants of Health   Financial Resource Strain: Low Risk  (10/18/2022)   Overall Financial Resource Strain (CARDIA)    Difficulty of Paying Living Expenses: Not hard at all  Food Insecurity: No Food Insecurity (10/18/2022)   Hunger Vital Sign    Worried About Running Out of Food in the Last Year: Never true    Ran Out of Food in the Last Year: Never true  Transportation Needs: No Transportation Needs (10/18/2022)   PRAPARE - Administrator, Civil Service (Medical): No    Lack of Transportation (Non-Medical): No  Physical Activity: Insufficiently Active (10/18/2022)   Exercise Vital Sign    Days of Exercise per Week: 2 days    Minutes of Exercise per Session: 40 min  Stress: No Stress Concern Present (08/25/2022)   [employer] of Occupational Health - Occupational Stress Questionnaire    Feeling of Stress : Not at all  Social Connections: Moderately Integrated (10/18/2022)  Social Advertising account executive [NHANES]    Frequency of Communication with Friends and Family: More than three times a week    Frequency of Social Gatherings with Friends and Family: Twice a week    Attends Religious Services: More than 4 times per year    Active Member of [employer] or Organizations: No    Attends Banker Meetings: Never    Marital Status: Married  Catering manager Violence: Not At Risk (08/25/2022)   Humiliation, Afraid, Rape, and Kick questionnaire    Fear of Current or Ex-Partner: No    Emotionally Abused: No    Physically Abused: No    Sexually Abused: No    Physical Exam: Vital  signs in last 24 hours: @BP  (!) 167/89   Pulse 63   Temp (!) 96.8 F (36 C)   Ht 5\' 6"  (1.676 m)   Wt 152 lb (68.9 kg)   SpO2 96%   BMI 24.53 kg/m  GEN: NAD EYE: Sclerae anicteric ENT: MMM CV: Non-tachycardic Pulm: CTA b/l GI: Soft, NT/ND NEURO:  Alert & Oriented x 3   Erick Blinks, MD [facility]  11/13/2022 9:54 AM

## 2022-11-13 NOTE — Progress Notes (Signed)
Pt states no changes to health hx since previsit ?

## 2022-11-17 ENCOUNTER — Telehealth: Payer: Self-pay | Admitting: *Deleted

## 2022-11-17 NOTE — Telephone Encounter (Signed)
Patient returning call. States she is feeling great. Please f/u if needing to further advise.  Thank you

## 2022-11-17 NOTE — Telephone Encounter (Signed)
No answer for post procedure call back. Left VM. 

## 2022-11-20 ENCOUNTER — Encounter: Payer: Self-pay | Admitting: Internal Medicine

## 2022-11-21 ENCOUNTER — Encounter: Payer: Self-pay | Admitting: Family Medicine

## 2022-12-10 ENCOUNTER — Encounter: Payer: Self-pay | Admitting: Family Medicine

## 2022-12-19 ENCOUNTER — Encounter: Payer: Self-pay | Admitting: Family Medicine

## 2022-12-21 NOTE — Telephone Encounter (Signed)
See referral note on this patient.

## 2022-12-22 ENCOUNTER — Encounter: Payer: Self-pay | Admitting: Family Medicine

## 2022-12-23 DIAGNOSIS — Z85828 Personal history of other malignant neoplasm of skin: Secondary | ICD-10-CM | POA: Diagnosis not present

## 2022-12-23 DIAGNOSIS — D2272 Melanocytic nevi of left lower limb, including hip: Secondary | ICD-10-CM | POA: Diagnosis not present

## 2022-12-23 DIAGNOSIS — D2262 Melanocytic nevi of left upper limb, including shoulder: Secondary | ICD-10-CM | POA: Diagnosis not present

## 2022-12-23 DIAGNOSIS — D225 Melanocytic nevi of trunk: Secondary | ICD-10-CM | POA: Diagnosis not present

## 2022-12-23 DIAGNOSIS — L570 Actinic keratosis: Secondary | ICD-10-CM | POA: Diagnosis not present

## 2022-12-23 DIAGNOSIS — D2261 Melanocytic nevi of right upper limb, including shoulder: Secondary | ICD-10-CM | POA: Diagnosis not present

## 2023-01-11 ENCOUNTER — Encounter: Payer: Self-pay | Admitting: Family Medicine

## 2023-01-12 NOTE — Telephone Encounter (Signed)
See phone note

## 2023-01-27 DIAGNOSIS — Z87891 Personal history of nicotine dependence: Secondary | ICD-10-CM | POA: Diagnosis not present

## 2023-01-27 DIAGNOSIS — I10 Essential (primary) hypertension: Secondary | ICD-10-CM | POA: Diagnosis not present

## 2023-01-27 DIAGNOSIS — Z7983 Long term (current) use of bisphosphonates: Secondary | ICD-10-CM | POA: Diagnosis not present

## 2023-01-27 DIAGNOSIS — J309 Allergic rhinitis, unspecified: Secondary | ICD-10-CM | POA: Diagnosis not present

## 2023-01-27 DIAGNOSIS — Z809 Family history of malignant neoplasm, unspecified: Secondary | ICD-10-CM | POA: Diagnosis not present

## 2023-01-27 DIAGNOSIS — Z902 Acquired absence of lung [part of]: Secondary | ICD-10-CM | POA: Diagnosis not present

## 2023-01-27 DIAGNOSIS — M1990 Unspecified osteoarthritis, unspecified site: Secondary | ICD-10-CM | POA: Diagnosis not present

## 2023-01-27 DIAGNOSIS — I2510 Atherosclerotic heart disease of native coronary artery without angina pectoris: Secondary | ICD-10-CM | POA: Diagnosis not present

## 2023-01-27 DIAGNOSIS — E785 Hyperlipidemia, unspecified: Secondary | ICD-10-CM | POA: Diagnosis not present

## 2023-01-27 DIAGNOSIS — M810 Age-related osteoporosis without current pathological fracture: Secondary | ICD-10-CM | POA: Diagnosis not present

## 2023-01-27 DIAGNOSIS — Z85118 Personal history of other malignant neoplasm of bronchus and lung: Secondary | ICD-10-CM | POA: Diagnosis not present

## 2023-01-27 DIAGNOSIS — Z8249 Family history of ischemic heart disease and other diseases of the circulatory system: Secondary | ICD-10-CM | POA: Diagnosis not present

## 2023-02-01 ENCOUNTER — Other Ambulatory Visit: Payer: Medicare HMO

## 2023-02-02 ENCOUNTER — Other Ambulatory Visit: Payer: Self-pay

## 2023-02-02 ENCOUNTER — Inpatient Hospital Stay: Payer: Medicare HMO | Attending: Internal Medicine

## 2023-02-02 ENCOUNTER — Encounter (HOSPITAL_COMMUNITY): Payer: Self-pay

## 2023-02-02 ENCOUNTER — Ambulatory Visit (HOSPITAL_COMMUNITY)
Admission: RE | Admit: 2023-02-02 | Discharge: 2023-02-02 | Disposition: A | Discharge status: Home / Self Care | Payer: Medicare HMO | Source: Ambulatory Visit | Attending: Internal Medicine | Admitting: Internal Medicine

## 2023-02-02 DIAGNOSIS — Z85118 Personal history of other malignant neoplasm of bronchus and lung: Secondary | ICD-10-CM | POA: Insufficient documentation

## 2023-02-02 DIAGNOSIS — J432 Centrilobular emphysema: Secondary | ICD-10-CM | POA: Diagnosis not present

## 2023-02-02 DIAGNOSIS — I700 Atherosclerosis of aorta: Secondary | ICD-10-CM | POA: Diagnosis not present

## 2023-02-02 DIAGNOSIS — C3490 Malignant neoplasm of unspecified part of unspecified bronchus or lung: Secondary | ICD-10-CM

## 2023-02-02 DIAGNOSIS — Z08 Encounter for follow-up examination after completed treatment for malignant neoplasm: Secondary | ICD-10-CM | POA: Insufficient documentation

## 2023-02-02 LAB — CMP (CANCER CENTER ONLY)
ALT: 15 U/L (ref 0–44)
AST: 18 U/L (ref 15–41)
Albumin: 3.9 g/dL (ref 3.5–5.0)
Alkaline Phosphatase: 75 U/L (ref 38–126)
Anion gap: 4 — ABNORMAL LOW (ref 5–15)
BUN: 14 mg/dL (ref 8–23)
CO2: 32 mmol/L (ref 22–32)
Calcium: 9.1 mg/dL (ref 8.9–10.3)
Chloride: 105 mmol/L (ref 98–111)
Creatinine: 0.58 mg/dL (ref 0.44–1.00)
GFR, Estimated: >60 mL/min (ref >60)
Glucose, Bld: 135 mg/dL — ABNORMAL HIGH (ref 70–99)
Potassium: 3.9 mmol/L (ref 3.5–5.1)
Sodium: 141 mmol/L (ref 135–145)
Total Bilirubin: 0.5 mg/dL (ref 0.3–1.2)
Total Protein: 6.4 g/dL — ABNORMAL LOW (ref 6.5–8.1)

## 2023-02-02 LAB — CBC WITH DIFFERENTIAL (CANCER CENTER ONLY)
Abs Immature Granulocytes: 0.03 10*3/uL (ref 0.00–0.07)
Basophils Absolute: 0.1 10*3/uL (ref 0.0–0.1)
Basophils Relative: 1 %
Eosinophils Absolute: 0.1 10*3/uL (ref 0.0–0.5)
Eosinophils Relative: 1 %
HCT: 42.8 % (ref 36.0–46.0)
Hemoglobin: 14 g/dL (ref 12.0–15.0)
Immature Granulocytes: 0 %
Lymphocytes Relative: 29 %
Lymphs Abs: 2.7 10*3/uL (ref 0.7–4.0)
MCH: 30 pg (ref 26.0–34.0)
MCHC: 32.7 g/dL (ref 30.0–36.0)
MCV: 91.8 fL (ref 80.0–100.0)
Monocytes Absolute: 0.5 10*3/uL (ref 0.1–1.0)
Monocytes Relative: 6 %
Neutro Abs: 5.7 10*3/uL (ref 1.7–7.7)
Neutrophils Relative %: 63 %
Platelet Count: 231 10*3/uL (ref 150–400)
RBC: 4.66 MIL/uL (ref 3.87–5.11)
RDW: 12.6 % (ref 11.5–15.5)
WBC Count: 9.1 10*3/uL (ref 4.0–10.5)
nRBC: 0 % (ref 0.0–0.2)

## 2023-02-02 MED ORDER — IOHEXOL 300 MG/ML  SOLN
75.0000 mL | Freq: Once | INTRAMUSCULAR | Status: AC | PRN
  Administered 2023-02-02: 75 mL via INTRAVENOUS

## 2023-02-02 MED ORDER — SODIUM CHLORIDE (PF) 0.9 % IJ SOLN
INTRAMUSCULAR | Status: AC
  Filled 2023-02-02: qty 50

## 2023-02-03 ENCOUNTER — Ambulatory Visit: Payer: Medicare HMO | Admitting: Internal Medicine

## 2023-02-09 ENCOUNTER — Inpatient Hospital Stay: Payer: Medicare HMO | Admitting: Internal Medicine

## 2023-02-09 VITALS — BP 134/63 | HR 62 | Temp 98.2°F | Resp 16 | Ht 66.0 in | Wt 153.6 lb

## 2023-02-09 DIAGNOSIS — Z08 Encounter for follow-up examination after completed treatment for malignant neoplasm: Secondary | ICD-10-CM | POA: Diagnosis not present

## 2023-02-09 DIAGNOSIS — C3490 Malignant neoplasm of unspecified part of unspecified bronchus or lung: Secondary | ICD-10-CM

## 2023-02-09 DIAGNOSIS — Z85118 Personal history of other malignant neoplasm of bronchus and lung: Secondary | ICD-10-CM | POA: Diagnosis not present

## 2023-02-09 NOTE — Progress Notes (Signed)
[facility] [phone]   Fax:([phone]  OFFICE PROGRESS NOTE  Joaquim Nam, MD [address] [place] [address]  DIAGNOSIS: Stage IA (T1b, N0, M0) non-small cell lung cancer, adenocarcinoma presented with right upper lobe lung nodule  PRIOR THERAPY:  status post right upper lobectomy with lymph node dissection under the care of Dr. Cliffton Asters on 01/07/2022.   CURRENT THERAPY: Observation.   INTERVAL HISTORY: Desiree Ortega 77 y.o. female returns to the clinic today for 71-month follow-up visit.  The patient is feeling fine today with no concerning complaints.  The patient denied having any current chest pain, shortness of breath, cough or hemoptysis.  She has no nausea, vomiting, diarrhea or constipation.  She has no headache or visual changes.  She has no recent weight loss or night sweats.  She is here today for evaluation with repeat CT scan of the chest for restaging of her disease.  MEDICAL HISTORY: Past Medical History:  Diagnosis Date   Allergy    Anxiety    Cancer (HCC) on back,leg,tailbone   prev squam and basal cell skin CA removed- Dr. Adolphus Birchwood   Cataract    BILATERAL,REMOVED   Diverticulosis    Moderate   Female bladder prolapse    H/O cold sores    History of colon polyps    History of kidney stones    Hyperlipidemia    Migraine with aura    Osteopenia    prev on fosamax for a few years then started evista at age ~83, T score improved  from -2.29/-1.96 to -1.8/-1.7 as of 06/2011, repeat DXA done 2015   Osteoporosis     ALLERGIES:  is allergic to codeine, influenza vaccines, tramadol hcl, and keflex [cephalexin].  MEDICATIONS:  Current Outpatient Medications  Medication Sig Dispense Refill   alendronate (FOSAMAX) 70 MG tablet Take 1 tablet (70 mg total) by mouth every 7 (seven) days. Take with a full glass of water on an empty stomach. 13 tablet 3   Ascorbic Acid (VITAMIN C) 1000 MG tablet Take 1 tablet (1,000  mg total) by mouth in the morning and at bedtime.     atorvastatin (LIPITOR) 10 MG tablet Take 1 tablet (10 mg total) by mouth daily. 90 tablet 3   b complex vitamins tablet Take 1 tablet by mouth daily.     Calcium Carbonate (CALCIUM 600 PO) Take 600 mg by mouth daily.     Cholecalciferol (VITAMIN D PO) Take 2,000 Units by mouth daily.     CINNAMON PO Take 2,000 mg by mouth daily.     Cyanocobalamin (B-12) 1000 MCG TBCR Take 2,000 mcg by mouth daily.     fexofenadine (ALLEGRA) 180 MG tablet Take 1 tablet (180 mg total) by mouth as needed. Seasonal allergies from Spring to Fall     Magnesium 250 MG TABS Take 250 mg by mouth daily.     Melatonin 10 MG TABS Take 10 mg by mouth at bedtime as needed (sleep).     Multiple Vitamin (MULTIVITAMIN) capsule Take 1 capsule by mouth daily.     Turmeric 500 MG CAPS Take 500 mg by mouth daily.     Ubiquinol 100 MG CAPS Take 100 mg by mouth daily.     Zinc 50 MG CAPS Take 50 mg by mouth daily.     No current facility-administered medications for this visit.    SURGICAL HISTORY:  Past Surgical History:  Procedure Laterality Date  BREAST BIOPSY Left    benign   CATARACT EXTRACTION W/ INTRAOCULAR LENS IMPLANT Left 11/2016   CATARACT EXTRACTION W/ INTRAOCULAR LENS IMPLANT Right 12/22/2016   COLONOSCOPY  08/15/2013   cyst on back     2 times   cyst on eyelid     right eye   CYSTOCELE REPAIR N/A 02/01/2018   Procedure: ANTERIOR REPAIR (CYSTOCELE);  Surgeon: Alfredo Martinez, MD;  Location: [facility];  Service: Urology;  Laterality: N/A;   CYSTOSCOPY N/A 02/01/2018   Procedure: cystoscopy;  Surgeon: Alfredo Martinez, MD;  Location: [facility];  Service: Urology;  Laterality: N/A;   INTERCOSTAL NERVE BLOCK Right 01/07/2022   Procedure: INTERCOSTAL NERVE BLOCK;  Surgeon: Corliss Skains, MD;  Location: [facility];  Service: Thoracic;  Laterality: Right;   LAPAROSCOPIC VAGINAL HYSTERECTOMY WITH SALPINGO OOPHORECTOMY Bilateral 02/01/2018   Procedure:  LAPAROSCOPIC ASSISTED VAGINAL HYSTERECTOMY WITH BILATERAL SALPINGO OOPHORECTOMY;  Surgeon: Maxie Better, MD;  Location: [facility];  Service: Gynecology;  Laterality: Bilateral;   LUNG LOBECTOMY     status post right upper lobectomy with lymph node dissection 01/07/2022.   NODE DISSECTION Right 01/07/2022   Procedure: NODE DISSECTION;  Surgeon: Corliss Skains, MD;  Location: [facility];  Service: Thoracic;  Laterality: Right;   SQUAMOUS CELL CARCINOMA EXCISION     on back/removed 2 times/and thigh   SQUAMOUS CELL CARCINOMA EXCISION Left 07/15/2016   left lower calf   SSC removed  1988   Birthmark removal partial 1/3   TONSILLECTOMY  1950   TUBAL LIGATION  1980's    REVIEW OF SYSTEMS:  A comprehensive review of systems was negative.   PHYSICAL EXAMINATION: General appearance: alert, cooperative, and no distress Head: Normocephalic, without obvious abnormality, atraumatic Neck: no adenopathy, no JVD, supple, symmetrical, trachea midline, and thyroid not enlarged, symmetric, no tenderness/mass/nodules Lymph nodes: Cervical, supraclavicular, and axillary nodes normal. Resp: clear to auscultation bilaterally Back: symmetric, no curvature. ROM normal. No CVA tenderness. Cardio: regular rate and rhythm, S1, S2 normal, no murmur, click, rub or gallop GI: soft, non-tender; bowel sounds normal; no masses,  no organomegaly Extremities: extremities normal, atraumatic, no cyanosis or edema  ECOG PERFORMANCE STATUS: 0 - Asymptomatic  Blood pressure 134/63, pulse 62, temperature 98.2 F (36.8 C), temperature source Oral, resp. rate 16, height 5\' 6"  (1.676 m), weight 153 lb 9.6 oz (69.7 kg), SpO2 96%.  LABORATORY DATA: Lab Results  Component Value Date   WBC 9.1 02/02/2023   HGB 14.0 02/02/2023   HCT 42.8 02/02/2023   MCV 91.8 02/02/2023   PLT 231 02/02/2023      Chemistry      Component Value Date/Time   NA 141 02/02/2023 1346   K 3.9 02/02/2023 1346   CL 105 02/02/2023 1346   CO2  32 02/02/2023 1346   BUN 14 02/02/2023 1346   CREATININE 0.58 02/02/2023 1346      Component Value Date/Time   CALCIUM 9.1 02/02/2023 1346   ALKPHOS 75 02/02/2023 1346   AST 18 02/02/2023 1346   ALT 15 02/02/2023 1346   BILITOT 0.5 02/02/2023 1346       RADIOGRAPHIC STUDIES: CT Chest W Contrast  Result Date: 02/08/2023 CLINICAL DATA:  Non-small cell lung cancer.  * Tracking Code: BO * EXAM: CT CHEST WITH CONTRAST TECHNIQUE: Multidetector CT imaging of the chest was performed during intravenous contrast administration. RADIATION DOSE REDUCTION: This exam was performed according to the departmental dose-optimization program which includes automated exposure control, adjustment of the mA and/or  kV according to patient size and/or use of iterative reconstruction technique. CONTRAST:  75mL OMNIPAQUE IOHEXOL 300 MG/ML  SOLN COMPARISON:  08/03/2022. FINDINGS: Cardiovascular: Atherosclerotic calcification of the aorta and coronary arteries. Heart is at the upper limits of normal in size. No pericardial effusion. Mediastinum/Nodes: Mediastinal lymph nodes are not enlarged by CT size criteria. No hilar or axillary adenopathy. Esophagus is grossly unremarkable. Lungs/Pleura: Right upper lobectomy. Pleuroparenchymal scarring at the base of the right hemithorax. Mild centrilobular emphysema. Calcified granulomas. 4 mm subpleural posteromedial left lower lobe nodule (6/97), unchanged from 09/30/2021 and likely benign. No specific follow-up required other than on routine imaging. No suspicious pulmonary nodules. No pleural fluid. Airway is otherwise unremarkable. Upper Abdomen: Probable hepatic cysts. Visualized portions of the liver, gallbladder, adrenal glands, kidneys, spleen, pancreas, stomach and bowel are otherwise grossly unremarkable. No upper abdominal adenopathy. Musculoskeletal: Degenerative changes in the spine. No worrisome lytic or sclerotic lesions. IMPRESSION: 1. No evidence of recurrent or  metastatic disease. 2. Aortic atherosclerosis (ICD10-I70.0). Coronary artery calcification. 3.  Emphysema (ICD10-J43.9). Electronically Signed   By: Leanna Battles M.D.   On: 02/08/2023 13:07    ASSESSMENT AND PLAN: This is a very pleasant 77 years old white female with stage Ia (T1b, N0, M0) non-small cell lung cancer, adenocarcinoma presented with right upper lobe lung nodule status post right upper lobectomy with lymph node sampling under the care of Dr. Cliffton Asters on January 07, 2022. The patient is currently on observation and she is feeling fine with no concerning complaints. She had repeat CT scan of the chest performed recently.  I personally and independently reviewed the scan and discussed the result with the patient today. Her scan showed no concerning findings for disease recurrence or metastasis. I recommended for her to continue on observation with repeat CT scan of the chest in 6 months. She was advised to call immediately if she has any other concerning symptoms in the interval. The patient voices understanding of current disease status and treatment options and is in agreement with the current care plan.  All questions were answered. The patient knows to call the clinic with any problems, questions or concerns. We can certainly see the patient much sooner if necessary.   Disclaimer: This note was dictated with voice recognition software. Similar sounding words can inadvertently be transcribed and may not be corrected upon review.

## 2023-07-05 DIAGNOSIS — Z008 Encounter for other general examination: Secondary | ICD-10-CM | POA: Diagnosis not present

## 2023-07-16 ENCOUNTER — Telehealth: Payer: Self-pay | Admitting: Family Medicine

## 2023-07-16 NOTE — Telephone Encounter (Signed)
I would defer extra testing at this point.  Thanks.

## 2023-07-16 NOTE — Telephone Encounter (Signed)
-----   Message from [facility] sent at 07/16/2023  2:46 PM EST ----- Regarding: Spirometry Screening I reached out to the patient to see if she has any wheezing or shortness of breath. She states that the only time she has SOB is when she is working out. Aside from that no. She is currently wearing a monitor that will also capture any SOB that she will take off on Sunday.

## 2023-07-19 DIAGNOSIS — H524 Presbyopia: Secondary | ICD-10-CM | POA: Diagnosis not present

## 2023-08-04 ENCOUNTER — Inpatient Hospital Stay: Payer: Medicare HMO | Attending: Internal Medicine

## 2023-08-04 ENCOUNTER — Ambulatory Visit (HOSPITAL_COMMUNITY)
Admission: RE | Admit: 2023-08-04 | Discharge: 2023-08-04 | Disposition: A | Discharge status: Home / Self Care | Payer: Medicare HMO | Source: Ambulatory Visit | Attending: Internal Medicine | Admitting: Internal Medicine

## 2023-08-04 DIAGNOSIS — C3490 Malignant neoplasm of unspecified part of unspecified bronchus or lung: Secondary | ICD-10-CM | POA: Insufficient documentation

## 2023-08-04 DIAGNOSIS — J432 Centrilobular emphysema: Secondary | ICD-10-CM | POA: Diagnosis not present

## 2023-08-04 DIAGNOSIS — Z85118 Personal history of other malignant neoplasm of bronchus and lung: Secondary | ICD-10-CM | POA: Insufficient documentation

## 2023-08-04 DIAGNOSIS — Z902 Acquired absence of lung [part of]: Secondary | ICD-10-CM | POA: Insufficient documentation

## 2023-08-04 DIAGNOSIS — I700 Atherosclerosis of aorta: Secondary | ICD-10-CM | POA: Diagnosis not present

## 2023-08-04 LAB — CMP (CANCER CENTER ONLY)
ALT: 17 U/L (ref 0–44)
AST: 18 U/L (ref 15–41)
Albumin: 4.2 g/dL (ref 3.5–5.0)
Alkaline Phosphatase: 73 U/L (ref 38–126)
Anion gap: 5 (ref 5–15)
BUN: 17 mg/dL (ref 8–23)
CO2: 31 mmol/L (ref 22–32)
Calcium: 9.1 mg/dL (ref 8.9–10.3)
Chloride: 105 mmol/L (ref 98–111)
Creatinine: 0.63 mg/dL (ref 0.44–1.00)
GFR, Estimated: >60 mL/min (ref >60)
Glucose, Bld: 107 mg/dL — ABNORMAL HIGH (ref 70–99)
Potassium: 4.5 mmol/L (ref 3.5–5.1)
Sodium: 141 mmol/L (ref 135–145)
Total Bilirubin: 0.6 mg/dL (ref 0.0–1.2)
Total Protein: 6.8 g/dL (ref 6.5–8.1)

## 2023-08-04 LAB — CBC WITH DIFFERENTIAL (CANCER CENTER ONLY)
Abs Immature Granulocytes: 0.03 10*3/uL (ref 0.00–0.07)
Basophils Absolute: 0.1 10*3/uL (ref 0.0–0.1)
Basophils Relative: 1 %
Eosinophils Absolute: 0.1 10*3/uL (ref 0.0–0.5)
Eosinophils Relative: 1 %
HCT: 45.9 % (ref 36.0–46.0)
Hemoglobin: 14.7 g/dL (ref 12.0–15.0)
Immature Granulocytes: 0 %
Lymphocytes Relative: 30 %
Lymphs Abs: 2.6 10*3/uL (ref 0.7–4.0)
MCH: 29.7 pg (ref 26.0–34.0)
MCHC: 32 g/dL (ref 30.0–36.0)
MCV: 92.7 fL (ref 80.0–100.0)
Monocytes Absolute: 0.7 10*3/uL (ref 0.1–1.0)
Monocytes Relative: 8 %
Neutro Abs: 5.2 10*3/uL (ref 1.7–7.7)
Neutrophils Relative %: 60 %
Platelet Count: 235 10*3/uL (ref 150–400)
RBC: 4.95 MIL/uL (ref 3.87–5.11)
RDW: 12.6 % (ref 11.5–15.5)
WBC Count: 8.6 10*3/uL (ref 4.0–10.5)
nRBC: 0 % (ref 0.0–0.2)

## 2023-08-04 MED ORDER — IOHEXOL 300 MG/ML  SOLN
75.0000 mL | Freq: Once | INTRAMUSCULAR | Status: AC | PRN
  Administered 2023-08-04: 75 mL via INTRAVENOUS

## 2023-08-10 ENCOUNTER — Inpatient Hospital Stay: Payer: Medicare HMO | Admitting: Internal Medicine

## 2023-08-10 VITALS — BP 135/55 | HR 71 | Temp 97.9°F | Resp 15 | Ht 66.0 in | Wt 157.8 lb

## 2023-08-10 DIAGNOSIS — Z902 Acquired absence of lung [part of]: Secondary | ICD-10-CM | POA: Diagnosis not present

## 2023-08-10 DIAGNOSIS — Z85118 Personal history of other malignant neoplasm of bronchus and lung: Secondary | ICD-10-CM | POA: Diagnosis not present

## 2023-08-10 DIAGNOSIS — C3490 Malignant neoplasm of unspecified part of unspecified bronchus or lung: Secondary | ICD-10-CM | POA: Diagnosis not present

## 2023-08-10 NOTE — Progress Notes (Signed)
 [facility] [phone]) [phone]   Fax:(336) [phone]  OFFICE PROGRESS NOTE  Joaquim Nam, MD [address] [place] [address]  DIAGNOSIS: Stage IA (T1b, N0, M0) non-small cell lung cancer, adenocarcinoma presented with right upper lobe lung nodule  PRIOR THERAPY:  status post right upper lobectomy with lymph node dissection under the care of Dr. Cliffton Asters on 01/07/2022.   CURRENT THERAPY: Observation.   INTERVAL HISTORY: Desiree Ortega 78 y.o. female returns to the clinic today for 65-month follow-up visit.Discussed the use of AI scribe software for clinical note transcription with the patient, who gave verbal consent to proceed.  History of Present Illness   Desiree Ortega "Desiree Ortega" is a 78 year old female with stage 1A non-small cell lung cancer, adenocarcinoma, who presents for routine follow-up after right upper lobectomy.  Diagnosed with stage 1A non-small cell lung cancer, adenocarcinoma, in July 2023, she underwent a right upper lobectomy. Since the surgery, she has been under surveillance with scans every six months, which have shown no concerning findings. She feels good overall.  She experiences a dry cough that sometimes makes her catch her breath, which she attributes to the removal of part of her lung. Additionally, her voice has been 'froggy' since the surgery, although it has improved slightly over time. No sick mucus in the morning.  Her family history is significant for cancer; her brother died of cancer, and another brother has been battling cancer for the last twenty years, initially diagnosed with prostate cancer. She expresses concern about her own cancer prognosis, given her family history.        MEDICAL HISTORY: Past Medical History:  Diagnosis Date   Allergy    Anxiety    Cancer (HCC) on back,leg,tailbone   prev squam and basal cell skin CA removed- Dr. Adolphus Birchwood   Cataract    BILATERAL,REMOVED   Diverticulosis     Moderate   Female bladder prolapse    H/O cold sores    History of colon polyps    History of kidney stones    Hyperlipidemia    Migraine with aura    Osteopenia    prev on fosamax for a few years then started evista at age ~44, T score improved  from -2.29/-1.96 to -1.8/-1.7 as of 06/2011, repeat DXA done 2015   Osteoporosis     ALLERGIES:  is allergic to codeine, influenza vaccines, tramadol hcl, and keflex [cephalexin].  MEDICATIONS:  Current Outpatient Medications  Medication Sig Dispense Refill   alendronate (FOSAMAX) 70 MG tablet Take 1 tablet (70 mg total) by mouth every 7 (seven) days. Take with a full glass of water on an empty stomach. 13 tablet 3   Ascorbic Acid (VITAMIN C) 1000 MG tablet Take 1 tablet (1,000 mg total) by mouth in the morning and at bedtime.     atorvastatin (LIPITOR) 10 MG tablet Take 1 tablet (10 mg total) by mouth daily. 90 tablet 3   b complex vitamins tablet Take 1 tablet by mouth daily.     Calcium Carbonate (CALCIUM 600 PO) Take 600 mg by mouth daily.     Cholecalciferol (VITAMIN D PO) Take 2,000 Units by mouth daily.     CINNAMON PO Take 2,000 mg by mouth daily.     Cyanocobalamin (B-12) 1000 MCG TBCR Take 2,000 mcg by mouth daily.     fexofenadine (ALLEGRA) 180 MG tablet Take 1 tablet (180 mg total) by mouth as needed. Seasonal  allergies from Spring to Fall     Magnesium 250 MG TABS Take 250 mg by mouth daily.     Melatonin 10 MG TABS Take 10 mg by mouth at bedtime as needed (sleep).     Multiple Vitamin (MULTIVITAMIN) capsule Take 1 capsule by mouth daily.     Turmeric 500 MG CAPS Take 500 mg by mouth daily.     Ubiquinol 100 MG CAPS Take 100 mg by mouth daily.     Zinc 50 MG CAPS Take 50 mg by mouth daily.     No current facility-administered medications for this visit.    SURGICAL HISTORY:  Past Surgical History:  Procedure Laterality Date   BREAST BIOPSY Left    benign   CATARACT EXTRACTION W/ INTRAOCULAR LENS IMPLANT Left 11/2016    CATARACT EXTRACTION W/ INTRAOCULAR LENS IMPLANT Right 12/22/2016   COLONOSCOPY  08/15/2013   cyst on back     2 times   cyst on eyelid     right eye   CYSTOCELE REPAIR N/A 02/01/2018   Procedure: ANTERIOR REPAIR (CYSTOCELE);  Surgeon: Alfredo Martinez, MD;  Location: [facility];  Service: Urology;  Laterality: N/A;   CYSTOSCOPY N/A 02/01/2018   Procedure: cystoscopy;  Surgeon: Alfredo Martinez, MD;  Location: [facility];  Service: Urology;  Laterality: N/A;   INTERCOSTAL NERVE BLOCK Right 01/07/2022   Procedure: INTERCOSTAL NERVE BLOCK;  Surgeon: Corliss Skains, MD;  Location: [facility];  Service: Thoracic;  Laterality: Right;   LAPAROSCOPIC VAGINAL HYSTERECTOMY WITH SALPINGO OOPHORECTOMY Bilateral 02/01/2018   Procedure: LAPAROSCOPIC ASSISTED VAGINAL HYSTERECTOMY WITH BILATERAL SALPINGO OOPHORECTOMY;  Surgeon: Maxie Better, MD;  Location: [facility];  Service: Gynecology;  Laterality: Bilateral;   LUNG LOBECTOMY     status post right upper lobectomy with lymph node dissection 01/07/2022.   NODE DISSECTION Right 01/07/2022   Procedure: NODE DISSECTION;  Surgeon: Corliss Skains, MD;  Location: [facility];  Service: Thoracic;  Laterality: Right;   SQUAMOUS CELL CARCINOMA EXCISION     on back/removed 2 times/and thigh   SQUAMOUS CELL CARCINOMA EXCISION Left 07/15/2016   left lower calf   SSC removed  1988   Birthmark removal partial 1/3   TONSILLECTOMY  1950   TUBAL LIGATION  1980's    REVIEW OF SYSTEMS:  A comprehensive review of systems was negative except for: Respiratory: positive for cough   PHYSICAL EXAMINATION: General appearance: alert, cooperative, and no distress Head: Normocephalic, without obvious abnormality, atraumatic Neck: no adenopathy, no JVD, supple, symmetrical, trachea midline, and thyroid not enlarged, symmetric, no tenderness/mass/nodules Lymph nodes: Cervical, supraclavicular, and axillary nodes normal. Resp: clear to auscultation bilaterally Back: symmetric, no  curvature. ROM normal. No CVA tenderness. Cardio: regular rate and rhythm, S1, S2 normal, no murmur, click, rub or gallop GI: soft, non-tender; bowel sounds normal; no masses,  no organomegaly Extremities: extremities normal, atraumatic, no cyanosis or edema  ECOG PERFORMANCE STATUS: 0 - Asymptomatic  Blood pressure (!) 135/55, pulse 71, temperature 97.9 F (36.6 C), temperature source Temporal, resp. rate 15, height 5\' 6"  (1.676 m), weight 157 lb 12.8 oz (71.6 kg), SpO2 98%.  LABORATORY DATA: Lab Results  Component Value Date   WBC 8.6 08/04/2023   HGB 14.7 08/04/2023   HCT 45.9 08/04/2023   MCV 92.7 08/04/2023   PLT 235 08/04/2023      Chemistry      Component Value Date/Time   NA 141 08/04/2023 0845   K 4.5 08/04/2023 0845   CL 105 08/04/2023 0845  CO2 31 08/04/2023 0845   BUN 17 08/04/2023 0845   CREATININE 0.63 08/04/2023 0845      Component Value Date/Time   CALCIUM 9.1 08/04/2023 0845   ALKPHOS 73 08/04/2023 0845   AST 18 08/04/2023 0845   ALT 17 08/04/2023 0845   BILITOT 0.6 08/04/2023 0845       RADIOGRAPHIC STUDIES: CT Chest W Contrast Result Date: 08/09/2023 CLINICAL DATA:  Non-small cell lung cancer, staging. * Tracking Code: BO * EXAM: CT CHEST WITH CONTRAST TECHNIQUE: Multidetector CT imaging of the chest was performed during intravenous contrast administration. RADIATION DOSE REDUCTION: This exam was performed according to the departmental dose-optimization program which includes automated exposure control, adjustment of the mA and/or kV according to patient size and/or use of iterative reconstruction technique. CONTRAST:  75mL OMNIPAQUE IOHEXOL 300 MG/ML  SOLN COMPARISON:  Chest CT 02/02/2023 and 08/03/2022. FINDINGS: Cardiovascular: No acute vascular findings identified. There is atherosclerosis of the aorta, great vessels and coronary arteries. The heart size is normal. There is no pericardial effusion. Mediastinum/Nodes: There are no enlarged mediastinal,  hilar or axillary lymph nodes. The thyroid gland, trachea and esophagus demonstrate no significant findings. Lungs/Pleura: Stable postsurgical changes from previous right upper lobectomy. Stable mild centrilobular emphysema, right basilar linear scarring and scattered calcified granulomas. A previously demonstrated subpleural nodule posteriorly in the left lower lobe has decreased in size (image 92/6). No new or enlarging pulmonary nodules. Upper abdomen: The visualized upper abdomen appears stable, without significant findings. There are stable low-density hepatic cysts. No adrenal mass. Musculoskeletal/Chest wall: There is no chest wall mass or suspicious osseous finding. Mild scoliosis. IMPRESSION: 1. Stable postsurgical changes from previous right upper lobectomy. No evidence of local recurrence or metastatic disease. 2. Coronary and Aortic Atherosclerosis (ICD10-I70.0) and Emphysema (ICD10-J43.9). Electronically Signed   By: Carey Bullocks M.D.   On: 08/09/2023 17:07    ASSESSMENT AND PLAN: This is a very pleasant 78 years old white female with stage IA (T1b, N0, M0) non-small cell lung cancer, adenocarcinoma presented with right upper lobe lung nodule status post right upper lobectomy with lymph node sampling under the care of Dr. Cliffton Asters on January 07, 2022. The patient is currently on observation and she is feeling fine with no concerning complaints except for dry cough and foggy voice. She had repeat CT scan of the chest performed recently.  I personally independently reviewed the scan and discussed the result with the patient today.  Her scan showed no concerning findings for local recurrence or metastatic disease.     Stage 1A Non-Small Cell Lung Cancer (NSCLC), Adenocarcinoma Diagnosed in July 2023. Underwent right upper lobectomy. Biannual scans for two years show no concerning findings. Reports dry cough and dysphonia post-surgery, likely related to lobectomy. Discussed high cure rate for  early-stage cancer, 80% five-year survival rate, and 45% recurrence risk. Patient concerned about recurrence and family history of cancer. Prefers annual follow-up scans. - Schedule follow-up scan and lab work in one year - Refer to ENT if dysphonia persists or worsens  General Health Maintenance Incidental findings on scans include coronary artery atherosclerosis, aortic atherosclerosis, and emphysema, consistent with age. - Continue routine health maintenance and monitoring  Follow-up - Schedule follow-up visit and scan in one year - Perform scan and lab work one week before follow-up visit.   She was advised to call immediately if she has any other concerning symptoms in the interval. The patient voices understanding of current disease status and treatment options and is in agreement  with the current care plan.  All questions were answered. The patient knows to call the clinic with any problems, questions or concerns. We can certainly see the patient much sooner if necessary.   Disclaimer: This note was dictated with voice recognition software. Similar sounding words can inadvertently be transcribed and may not be corrected upon review.

## 2023-08-11 ENCOUNTER — Telehealth: Payer: Self-pay

## 2023-08-11 NOTE — Telephone Encounter (Signed)
 Spoke with patient this morning and schedule patient for labs before CT scan and appt with Dr. Arbutus Ped a week later. Patient verbalized understanding.

## 2023-08-12 ENCOUNTER — Telehealth: Payer: Self-pay | Admitting: Internal Medicine

## 2023-08-12 NOTE — Telephone Encounter (Signed)
 Marland Kitchen

## 2023-08-22 ENCOUNTER — Other Ambulatory Visit: Payer: Self-pay | Admitting: Family Medicine

## 2023-08-22 DIAGNOSIS — E785 Hyperlipidemia, unspecified: Secondary | ICD-10-CM

## 2023-08-22 DIAGNOSIS — M810 Age-related osteoporosis without current pathological fracture: Secondary | ICD-10-CM

## 2023-08-23 ENCOUNTER — Other Ambulatory Visit (INDEPENDENT_AMBULATORY_CARE_PROVIDER_SITE_OTHER): Payer: Medicare HMO

## 2023-08-23 DIAGNOSIS — M810 Age-related osteoporosis without current pathological fracture: Secondary | ICD-10-CM | POA: Diagnosis not present

## 2023-08-23 DIAGNOSIS — E785 Hyperlipidemia, unspecified: Secondary | ICD-10-CM

## 2023-08-23 LAB — VITAMIN D 25 HYDROXY (VIT D DEFICIENCY, FRACTURES): VITD: 61.18 ng/mL (ref 30.00–100.00)

## 2023-08-23 LAB — LIPID PANEL
Cholesterol: 164 mg/dL (ref 0–200)
HDL: 52.7 mg/dL (ref >39.00)
LDL Cholesterol: 83 mg/dL (ref 0–99)
NonHDL: 111.4
Total CHOL/HDL Ratio: 3
Triglycerides: 144 mg/dL (ref 0.0–149.0)
VLDL: 28.8 mg/dL (ref 0.0–40.0)

## 2023-08-30 ENCOUNTER — Ambulatory Visit (INDEPENDENT_AMBULATORY_CARE_PROVIDER_SITE_OTHER): Payer: Medicare HMO

## 2023-08-30 ENCOUNTER — Encounter: Payer: Self-pay | Admitting: Family Medicine

## 2023-08-30 ENCOUNTER — Ambulatory Visit (INDEPENDENT_AMBULATORY_CARE_PROVIDER_SITE_OTHER): Payer: Medicare HMO | Admitting: Family Medicine

## 2023-08-30 ENCOUNTER — Encounter: Payer: Medicare HMO | Admitting: Family Medicine

## 2023-08-30 VITALS — Ht 66.0 in | Wt 157.0 lb

## 2023-08-30 VITALS — BP 150/78 | HR 72 | Temp 98.8°F | Ht 66.0 in | Wt 156.4 lb

## 2023-08-30 DIAGNOSIS — Z7189 Other specified counseling: Secondary | ICD-10-CM | POA: Diagnosis not present

## 2023-08-30 DIAGNOSIS — C3490 Malignant neoplasm of unspecified part of unspecified bronchus or lung: Secondary | ICD-10-CM | POA: Diagnosis not present

## 2023-08-30 DIAGNOSIS — I2510 Atherosclerotic heart disease of native coronary artery without angina pectoris: Secondary | ICD-10-CM | POA: Diagnosis not present

## 2023-08-30 DIAGNOSIS — Z0000 Encounter for general adult medical examination without abnormal findings: Secondary | ICD-10-CM | POA: Diagnosis not present

## 2023-08-30 DIAGNOSIS — M810 Age-related osteoporosis without current pathological fracture: Secondary | ICD-10-CM

## 2023-08-30 DIAGNOSIS — G43109 Migraine with aura, not intractable, without status migrainosus: Secondary | ICD-10-CM | POA: Diagnosis not present

## 2023-08-30 MED ORDER — METHYLPREDNISOLONE ACETATE 80 MG/ML IJ SUSP
80.0000 mg | Freq: Once | INTRAMUSCULAR | Status: AC
Start: 2023-08-30 — End: 2023-08-30
  Administered 2023-08-30: 80 mg via INTRAMUSCULAR

## 2023-08-30 MED ORDER — ALENDRONATE SODIUM 70 MG PO TABS: 70.0000 mg | ORAL_TABLET | ORAL | 3 refills | Status: AC

## 2023-08-30 MED ORDER — ATORVASTATIN CALCIUM 10 MG PO TABS: 10.0000 mg | ORAL_TABLET | Freq: Every day | ORAL | 3 refills | Status: AC

## 2023-08-30 NOTE — Progress Notes (Unsigned)
 Migraines.  Overall migraines are not more frequent. In the mornings when she has symptoms it feels like pressure in her eyes and that her eyes are swollen.   She had aura with recent event that improved with tylenol.  Prev relief with Depo medrol 80mg  IM.  Used rarely.  Prev helped w/o ADE on med. Recent photophobia.  D/w pt about retrial of Depo-Medrol at office visit.  Shoulder pain- she thought she pulled a muscle, had R sided upper back pain.  After working out.  Some better now.  Prev took tylenol for pain.    D/w pt about ENT f/u if needed.  She has some voice changes since treatment but wasn't concerned/wanting ENT eval now.  She can update me as needed.  She has yearly oncology f/u now.  Some occ dry cough at baseline. No sputum. No hemoptysis.    Incidental CAD on prev scans.  On lipitor.  No CP.  No diffuse aches, labs d/w pt.    D/w pt about home ABI check that was normal per insurance visit.  She had low FEV/FVC ratio sitting but it normalized on standing.  She had emphysematous changes on CT. No wheeze, not SOB unless sig exertion and that is better than prior.    Osteoporosis.  Still on fosamax.  No ADE on med.  Compliant.    Mammogram 2024 DXA 2024.   Colonoscopy 2024.   Living will d/w pt.  Husband would be designated if she were incapacitated.  If husband unavailable, then daughter Dorie Rank (then daughter Jearld Lesch) designated if needed  Meds, vitals, and allergies reviewed.   ROS: Per HPI unless specifically indicated in ROS section   GEN: nad, alert and oriented HEENT: ncat NECK: supple w/o LA CV: rrr.  PULM: ctab, no inc wob ABD: soft, +bs EXT: no edema SKIN: ncat Wearing sunglasses at the visit due to photophobia  34 minutes were devoted to patient care in this encounter (this includes time spent reviewing the patient's file/history, interviewing and examining the patient, counseling/reviewing plan with patient).

## 2023-08-30 NOTE — Patient Instructions (Signed)
 Desiree Ortega , Thank you for taking time to come for your Medicare Wellness Visit. I appreciate your ongoing commitment to your health goals. Please review the following plan we discussed and let me know if I can assist you in the future.   Referrals/Orders/Follow-Ups/Clinician Recommendations: none  This is a list of the screening recommended for you and due dates:  Health Maintenance  Topic Date Due   Zoster (Shingles) Vaccine (2 of 2) 10/27/2022   Mammogram  09/16/2023   Medicare Annual Wellness Visit  08/29/2024   DTaP/Tdap/Td vaccine (4 - Td or Tdap) 04/28/2025   Pneumonia Vaccine  Completed   DEXA scan (bone density measurement)  Completed   Hepatitis C Screening  Completed   HPV Vaccine  Aged Out   Colon Cancer Screening  Discontinued   COVID-19 Vaccine  Discontinued    Advanced directives: (In Chart) A copy of your advanced directives are scanned into your chart should your provider ever need it.  Next Medicare Annual Wellness Visit scheduled for next year: Yes 08/31/2024 @ 10:10am telephone

## 2023-08-30 NOTE — Progress Notes (Signed)
 Subjective:   Desiree Ortega is a 78 y.o. who presents for a Medicare Wellness preventive visit.  Visit Complete: Virtual I connected with  Desiree Ortega on 08/30/23 by a audio enabled telemedicine application and verified that I am speaking with the correct person using two identifiers.  Patient Location: Home  Provider Location: Home Office  I discussed the limitations of evaluation and management by telemedicine. The patient expressed understanding and agreed to proceed.  Vital Signs: Because this visit was a virtual/telehealth visit, some criteria may be missing or patient reported. Any vitals not documented were not able to be obtained and vitals that have been documented are patient reported.  VideoDeclined- This patient declined Librarian, academic. Therefore the visit was completed with audio only.  AWV Questionnaire: Yes: Patient Medicare AWV questionnaire was completed by the patient on 08/26/23; I have confirmed that all information answered by patient is correct and no changes since this date.  Cardiac Risk Factors include: advanced age (>104men, >63 women);dyslipidemia     Objective:    Today's Vitals   08/30/23 1014  Weight: 157 lb (71.2 kg)  Height: 5\' 6"  (1.676 m)   Body mass index is 25.34 kg/m.     08/30/2023   10:23 AM 11/13/2022    9:09 AM 08/25/2022   10:35 AM 02/02/2022    2:28 PM 01/07/2022    8:00 PM 01/05/2022    1:43 PM 08/21/2021   10:37 AM  Advanced Directives  Does Patient Have a Medical Advance Directive? Yes Yes Yes No No Yes Yes  Type of Estate agent of [place];Living will Healthcare Power of [place];Living will Healthcare Power of [place];Living will   Healthcare Power of [place];Living will Healthcare Power of [place];Living will  Does patient want to make changes to medical advance directive?   No - Patient declined   No - Patient declined Yes (MAU/Ambulatory/Procedural Areas - Information  given)  Copy of Healthcare Power of Attorney in Chart? Yes - validated most recent copy scanned in chart (See row information)  Yes - validated most recent copy scanned in chart (See row information)   Yes - validated most recent copy scanned in chart (See row information) Yes - validated most recent copy scanned in chart (See row information)  Would patient like information on creating a medical advance directive?    No - Patient declined No - Patient declined      Current Medications (verified) Outpatient Encounter Medications as of 08/30/2023  Medication Sig   alendronate (FOSAMAX) 70 MG tablet Take 1 tablet (70 mg total) by mouth every 7 (seven) days. Take with a full glass of water on an empty stomach.   Ascorbic Acid (VITAMIN C) 1000 MG tablet Take 1 tablet (1,000 mg total) by mouth in the morning and at bedtime.   atorvastatin (LIPITOR) 10 MG tablet Take 1 tablet (10 mg total) by mouth daily.   b complex vitamins tablet Take 1 tablet by mouth daily.   Calcium Carbonate (CALCIUM 600 PO) Take 600 mg by mouth daily.   Cholecalciferol (VITAMIN D PO) Take 2,000 Units by mouth daily.   CINNAMON PO Take 2,000 mg by mouth daily.   Cyanocobalamin (B-12) 1000 MCG TBCR Take 2,000 mcg by mouth daily.   fexofenadine (ALLEGRA) 180 MG tablet Take 1 tablet (180 mg total) by mouth as needed. Seasonal allergies from Spring to Fall   Magnesium 250 MG TABS Take 250 mg by mouth daily.   Melatonin 10  MG TABS Take 10 mg by mouth at bedtime as needed (sleep).   Multiple Vitamin (MULTIVITAMIN) capsule Take 1 capsule by mouth daily.   Turmeric 500 MG CAPS Take 500 mg by mouth daily.   Ubiquinol 100 MG CAPS Take 100 mg by mouth daily.   Zinc 50 MG CAPS Take 50 mg by mouth daily.   No facility-administered encounter medications on file as of 08/30/2023.    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 Adolphus Birchwood   Cataract    BILATERAL,REMOVED   Diverticulosis    Moderate   Female bladder prolapse    H/O cold sores    History of colon polyps    History of kidney stones    Hyperlipidemia    Migraine with aura    Osteopenia    prev on fosamax for a few years then started evista at age ~32, T score improved  from -2.29/-1.96 to -1.8/-1.7 as of 06/2011, repeat DXA done 2015   Osteoporosis    Past Surgical History:  Procedure Laterality Date   ABDOMINAL HYSTERECTOMY  Aug 2019 incl bladder repair   BREAST BIOPSY Left    benign   CATARACT EXTRACTION W/ INTRAOCULAR LENS IMPLANT Left 11/2016   CATARACT EXTRACTION W/ INTRAOCULAR LENS IMPLANT Right 12/22/2016   COLONOSCOPY  08/15/2013   cyst on back     2 times   cyst on eyelid     right eye   CYSTOCELE REPAIR N/A 02/01/2018   Procedure: ANTERIOR REPAIR (CYSTOCELE);  Surgeon: Alfredo Martinez, MD;  Location: [facility];  Service: Urology;  Laterality: N/A;   CYSTOSCOPY N/A 02/01/2018   Procedure: cystoscopy;  Surgeon: Alfredo Martinez, MD;  Location: [facility];  Service: Urology;  Laterality: N/A;   EYE SURGERY  Cataracts 2018   FRACTURE SURGERY  Left wrist  2005   INTERCOSTAL NERVE BLOCK Right 01/07/2022   Procedure: INTERCOSTAL NERVE BLOCK;  Surgeon: Corliss Skains, MD;  Location: [facility];  Service: Thoracic;  Laterality: Right;   LAPAROSCOPIC VAGINAL HYSTERECTOMY WITH SALPINGO OOPHORECTOMY Bilateral 02/01/2018   Procedure: LAPAROSCOPIC ASSISTED VAGINAL HYSTERECTOMY WITH BILATERAL SALPINGO OOPHORECTOMY;  Surgeon: Maxie Better, MD;  Location: [facility];  Service: Gynecology;  Laterality: Bilateral;   LUNG LOBECTOMY     status post right upper lobectomy with lymph node dissection 01/07/2022.   NODE DISSECTION Right 01/07/2022   Procedure: NODE DISSECTION;  Surgeon: Corliss Skains, MD;  Location: [facility];  Service: Thoracic;  Laterality:  Right;   SQUAMOUS CELL CARCINOMA EXCISION     on back/removed 2 times/and thigh   SQUAMOUS CELL CARCINOMA EXCISION Left 07/15/2016   left lower calf   SSC removed  1988   Birthmark removal partial 1/3   TONSILLECTOMY  1950   TUBAL LIGATION  1980's   Family History  Problem Relation Age of Onset   Osteopenia Mother    Anxiety disorder Mother    Hearing loss Mother    Vision loss Mother    Hypertension Father    Heart disease Father        AFIB, PVD stents LE's, pacer   Stroke Father    Cancer Brother        prostate, prostatectomy, radiation 5 years later, Hormone Tx   Prostate cancer Brother    Heart  disease Brother    Cancer Brother        prostate, prostatectomy   Prostate cancer Brother    Lung cancer Brother    Colon polyps Maternal Uncle    Depression Daughter    Diabetes Other    Cancer Other        Non-Hodgkin's Lymphoma   Anxiety disorder Daughter    Alcohol abuse Neg Hx    Drug abuse Neg Hx    Colon cancer Neg Hx    Breast cancer Neg Hx    Bladder Cancer Neg Hx    Kidney cancer Neg Hx    Crohn's disease Neg Hx    Esophageal cancer Neg Hx    Rectal cancer Neg Hx    Stomach cancer Neg Hx    Ulcerative colitis Neg Hx    Social History   Socioeconomic History   Marital status: Married    Spouse name: Not on file   Number of children: 4   Years of education: Not on file   Highest education level: 12th grade  Occupational History   Occupation: [employer], [employer]    Comment: Teaches swimming  Tobacco Use   Smoking status: Former    Current packs/day: 0.00    Average packs/day: 0.5 packs/day for 36.0 years (18.0 ttl pk-yrs)    Types: Cigarettes    Start date: 06/22/1965    Quit date: 06/22/2001    Years since quitting: 22.2   Smokeless tobacco: Never  Vaping Use   Vaping status: Never Used  Substance and Sexual Activity   Alcohol use: Not Currently    Alcohol/week: 2.0 standard drinks of alcohol    Types: 2 Glasses of wine per week     Comment: occasionally   Drug use: No   Sexual activity: Yes    Birth control/protection: Post-menopausal  Other Topics Concern   Not on file  Social History Narrative   Married 1963, lives with husband   Cubs and Bears fan   Mother in ALF as of 2024 (pt's mother was born in [place])   4 daughters   Taught swimming prev   Social Drivers of Health   Financial Resource Strain: Low Risk  (08/30/2023)   Overall Financial Resource Strain (CARDIA)    Difficulty of Paying Living Expenses: Not hard at all  Food Insecurity: No Food Insecurity (08/30/2023)   Hunger Vital Sign    Worried About Running Out of Food in the Last Year: Never true    Ran Out of Food in the Last Year: Never true  Transportation Needs: No Transportation Needs (08/30/2023)   PRAPARE - Administrator, Civil Service (Medical): No    Lack of Transportation (Non-Medical): No  Physical Activity: Insufficiently Active (08/30/2023)   Exercise Vital Sign    Days of Exercise per Week: 2 days    Minutes of Exercise per Session: 30 min  Stress: No Stress Concern Present (08/30/2023)   [employer] of Occupational Health - Occupational Stress Questionnaire    Feeling of Stress : Only a little  Social Connections: Moderately Integrated (08/30/2023)   Social Connection and Isolation Panel [NHANES]    Frequency of Communication with Friends and Family: More than three times a week    Frequency of Social Gatherings with Friends and Family: Three times a week    Attends Religious Services: More than 4 times per year    Active Member of Clubs or Organizations: No    Attends Banker  Meetings: Never    Marital Status: Married    Tobacco Counseling Counseling given: Not Answered    Clinical Intake:  Pre-visit preparation completed: Yes  Pain : No/denies pain     BMI - recorded: 25.34 Nutritional Risks: None  How often do you need to have someone help you when you read instructions, pamphlets,  or other written materials from your doctor or pharmacy?: 1 - Never  Interpreter Needed?: No  Comments: lives with husband Information entered by :: B.Wilene Pharo,LPN   Activities of Daily Living     08/26/2023    7:08 AM  In your present state of health, do you have any difficulty performing the following activities:  Hearing? 0  Vision? 0  Difficulty concentrating or making decisions? 0  Walking or climbing stairs? 0  Dressing or bathing? 0  Doing errands, shopping? 0  Preparing Food and eating ? N  Using the Toilet? N  In the past six months, have you accidently leaked urine? N  Do you have problems with loss of bowel control? N  Managing your Medications? N  Managing your Finances? N  Housekeeping or managing your Housekeeping? N    Patient Care Team: Joaquim Nam, MD as PCP - General (Family Medicine) Carrie Mew, OD as Consulting Physician (Optometry) Dasher, Cliffton Asters, MD as Consulting Physician (Dermatology) Bartholomew Boards, DDS as Consulting Physician (Dentistry) Julio Sicks, MD as Consulting Physician (Neurosurgery)  Indicate any recent Medical Services you may have received from other than [facility] providers in the past year (date may be approximate).     Assessment:   This is a routine wellness examination for Desiree Ortega.  Hearing/Vision screen Hearing Screening - Comments:: Pt says her hearing is not as good but hears enough Vision Screening - Comments:: Pt says her vision is good;readers only Dr Lew Dawes   Goals Addressed             This Visit's Progress    Patient Stated   On track    08/30/23-, I will maintain and continue medications as prescribed.      Patient Stated       08-30-23-would like to get back to eating healthier and exercising        Depression Screen     08/30/2023   10:20 AM 11/03/2022   10:50 AM 10/19/2022   12:13 PM 08/28/2022    8:18 AM 08/25/2022   10:38 AM 08/21/2021   10:39 AM 08/20/2020   10:29 AM  PHQ 2/9 Scores  PHQ - 2 Score 0  0 0 0 0 0 0  PHQ- 9 Score  1 1 0   0    Fall Risk     08/26/2023    7:08 AM 11/03/2022   10:50 AM 10/19/2022   12:13 PM 08/28/2022    8:18 AM 08/25/2022   10:36 AM  Fall Risk   Falls in the past year? 0 0 0 0 0  Number falls in past yr: 0 0 0 0 0  Injury with Fall? 0 0 0 0 0  Risk for fall due to : No Fall Risks No Fall Risks No Fall Risks No Fall Risks No Fall Risks  Follow up Education provided;Falls prevention discussed Falls evaluation completed Falls evaluation completed Falls evaluation completed Falls prevention discussed;Falls evaluation completed    MEDICARE RISK AT HOME:  Medicare Risk at Home Any stairs in or around the home?: (Patient-Rptd) Yes If so, are there any without handrails?: (Patient-Rptd) No Home free  of loose throw rugs in walkways, pet beds, electrical cords, etc?: (Patient-Rptd) Yes Adequate lighting in your home to reduce risk of falls?: (Patient-Rptd) Yes Life alert?: (Patient-Rptd) No Use of a cane, walker or w/c?: (Patient-Rptd) No Grab bars in the bathroom?: (Patient-Rptd) Yes Shower chair or bench in shower?: (Patient-Rptd) Yes Elevated toilet seat or a handicapped toilet?: (Patient-Rptd) Yes  TIMED UP AND GO:  Was the test performed?  No  Cognitive Function: 6CIT completed    08/20/2020   10:32 AM 08/18/2019   12:07 PM 08/12/2018   10:10 AM 08/04/2017    9:41 AM 07/16/2016    8:10 AM  MMSE - Mini Mental State Exam  Orientation to time 5 5 5 5 5   Orientation to Place 5 5 5 5 5   Registration 3 3 3 3 3   Attention/ Calculation 5 5 0 0 0  Recall 3 3 3 2 3   Recall-comments    unable to recall 1 of 3 words   Language- name 2 objects   0 0 0  Language- repeat 1 1 1 1 1   Language- follow 3 step command   3 3 3   Language- read & follow direction   0 0 0  Write a sentence   0 0 0  Copy design   0 0 0  Total score   20 19 20         08/30/2023   10:24 AM 08/25/2022   10:39 AM  6CIT Screen  What Year? 0 points 0 points  What month? 0 points 0 points   What time? 0 points 0 points  Count back from 20 0 points 0 points  Months in reverse 0 points 0 points  Repeat phrase 0 points 4 points  Total Score 0 points 4 points    Immunizations Immunization History  Administered Date(s) Administered   Influenza, Seasonal, Injecte, Preservative Fre 07/01/2012   PFIZER(Purple Top)SARS-COV-2 Vaccination 09/05/2019, 09/26/2019, 03/27/2020   Pneumococcal Conjugate-13 07/15/2015   Pneumococcal Polysaccharide-23 06/25/2011   Rsv, Bivalent, Protein Subunit Rsvpref,pf (Abrysvo) 09/04/2022   Td 11/20/1997, 06/11/2008   Tdap 04/29/2015   Zoster Recombinant(Shingrix) 09/01/2022    Screening Tests Health Maintenance  Topic Date Due   Zoster Vaccines- Shingrix (2 of 2) 10/27/2022   MAMMOGRAM  09/16/2023   Medicare Annual Wellness (AWV)  08/29/2024   DTaP/Tdap/Td (4 - Td or Tdap) 04/28/2025   Pneumonia Vaccine 14+ Years old  Completed   DEXA SCAN  Completed   Hepatitis C Screening  Completed   HPV VACCINES  Aged Out   Colonoscopy  Discontinued   COVID-19 Vaccine  Discontinued    Health Maintenance  Health Maintenance Due  Topic Date Due   Zoster Vaccines- Shingrix (2 of 2) 10/27/2022   Health Maintenance Items Addressed: None needed   Additional Screening:  Vision Screening: Recommended annual ophthalmology exams for early detection of glaucoma and other disorders of the eye.  Dental Screening: Recommended annual dental exams for proper oral hygiene  Community Resource Referral / Chronic Care Management: CRR required this visit?  No   CCM required this visit?  No    Plan:     I have personally reviewed and noted the following in the patient's chart:   Medical and social history Use of alcohol, tobacco or illicit drugs  Current medications and supplements including opioid prescriptions. Patient is not currently taking opioid prescriptions. Functional ability and status Nutritional status Physical activity Advanced  directives List of other physicians Hospitalizations, surgeries, and ER  visits in previous 12 months Vitals Screenings to include cognitive, depression, and falls Referrals and appointments  In addition, I have reviewed and discussed with patient certain preventive protocols, quality metrics, and best practice recommendations. A written personalized care plan for preventive services as well as general preventive health recommendations were provided to patient.    Sue Lush, LPN   1/61/0960   After Visit Summary: (MyChart) Due to this being a telephonic visit, the after visit summary with patients personalized plan was offered to patient via MyChart   Notes:  Pt indicates she has appt w/PCP today and would like to discuss muscular back pain and migraines.

## 2023-08-30 NOTE — Patient Instructions (Signed)
 Depo medrol 80mg  IM today.  Update me as needed.  Don't change your meds for now.  Take care.  Glad to see you.

## 2023-09-01 NOTE — Assessment & Plan Note (Signed)
 Mammogram 2024 DXA 2024.   Colonoscopy 2024.   Living will d/w pt.  Husband would be designated if she were incapacitated.  If husband unavailable, then daughter Dorie Rank (then daughter Jearld Lesch) designated if needed

## 2023-09-01 NOTE — Assessment & Plan Note (Signed)
Living will d/w pt.  Husband would be designated if she were incapacitated.  If husband unavailable, then daughter Gypsy Decant (then daughter Malon Kindle) designated if needed

## 2023-09-01 NOTE — Assessment & Plan Note (Signed)
 Still on fosamax.  No ADE on med.  Compliant.  Continue Fosamax as is.

## 2023-09-01 NOTE — Assessment & Plan Note (Signed)
 Would continue Lipitor as is.  No cramping on medication.

## 2023-09-01 NOTE — Assessment & Plan Note (Signed)
 History of, with previous CT showing emphysematous changes.  Discussed with patient. No wheeze, not SOB unless sig exertion and that is better than prior.  She has yearly oncology follow-up.

## 2023-09-01 NOTE — Assessment & Plan Note (Signed)
 Discussed with patient about options.  Depo-Medrol dose given at office visit today and she can update me if she does not get relief of her headache with that.

## 2023-12-29 DIAGNOSIS — Z85828 Personal history of other malignant neoplasm of skin: Secondary | ICD-10-CM | POA: Diagnosis not present

## 2023-12-29 DIAGNOSIS — D2261 Melanocytic nevi of right upper limb, including shoulder: Secondary | ICD-10-CM | POA: Diagnosis not present

## 2023-12-29 DIAGNOSIS — D2272 Melanocytic nevi of left lower limb, including hip: Secondary | ICD-10-CM | POA: Diagnosis not present

## 2023-12-29 DIAGNOSIS — D2262 Melanocytic nevi of left upper limb, including shoulder: Secondary | ICD-10-CM | POA: Diagnosis not present

## 2023-12-29 DIAGNOSIS — D225 Melanocytic nevi of trunk: Secondary | ICD-10-CM | POA: Diagnosis not present

## 2023-12-29 DIAGNOSIS — L570 Actinic keratosis: Secondary | ICD-10-CM | POA: Diagnosis not present

## 2024-04-19 DIAGNOSIS — C44529 Squamous cell carcinoma of skin of other part of trunk: Secondary | ICD-10-CM | POA: Diagnosis not present

## 2024-04-19 DIAGNOSIS — D485 Neoplasm of uncertain behavior of skin: Secondary | ICD-10-CM | POA: Diagnosis not present

## 2024-05-02 DIAGNOSIS — C44529 Squamous cell carcinoma of skin of other part of trunk: Secondary | ICD-10-CM | POA: Diagnosis not present

## 2024-05-02 DIAGNOSIS — C44519 Basal cell carcinoma of skin of other part of trunk: Secondary | ICD-10-CM | POA: Diagnosis not present

## 2024-06-09 ENCOUNTER — Encounter: Payer: Self-pay | Admitting: Emergency Medicine

## 2024-06-09 ENCOUNTER — Ambulatory Visit
Admission: EM | Admit: 2024-06-09 | Discharge: 2024-06-09 | Disposition: A | Discharge status: Home / Self Care | Attending: Emergency Medicine | Admitting: Emergency Medicine

## 2024-06-09 DIAGNOSIS — J069 Acute upper respiratory infection, unspecified: Secondary | ICD-10-CM | POA: Diagnosis not present

## 2024-06-09 MED ORDER — AZITHROMYCIN 250 MG PO TABS: 250.0000 mg | ORAL_TABLET | Freq: Every day | ORAL | 0 refills | Status: AC

## 2024-06-09 NOTE — Discharge Instructions (Signed)
 Symptoms persisting for 7 days at this time we will place you on antibiotics to clear any germs contributing to your symptoms  Take azithromycin as directed    You can take Tylenol  and/or Ibuprofen as needed for fever reduction and pain relief.   For cough: honey 1/2 to 1 teaspoon (you can dilute the honey in water or another fluid).  You can also use guaifenesin and dextromethorphan for cough. You can use a humidifier for chest congestion and cough.  If you don't have a humidifier, you can sit in the bathroom with the hot shower running.      For sore throat: try warm salt water gargles, cepacol lozenges, throat spray, warm tea or water with lemon/honey, popsicles or ice, or OTC cold relief medicine for throat discomfort.   For congestion: take a daily anti-histamine like Zyrtec, Claritin , and a oral decongestant, such as pseudoephedrine.  You can also use Flonase 1-2 sprays in each nostril daily.   It is important to stay hydrated: drink plenty of fluids (water, gatorade/powerade/pedialyte, juices, or teas) to keep your throat moisturized and help further relieve irritation/discomfort.

## 2024-06-09 NOTE — ED Provider Notes (Signed)
 " CAY RALPH PELT    CSN: [id number] Arrival date & time: 06/09/24  0844      History   Chief Complaint Chief Complaint  Patient presents with   Cough   Sore Throat    HPI Desiree Ortega is a 78 y.o. female.   Patient arrives for evaluation of chest congestion and a nonproductive cough beginning 7 days ago.  Over the past 24 hours has been experiencing sore throat, feels hide within the throat.  Tolerable to food and liquids.  Has attempted use of Robitussin and Mucinex.  No known sick contacts prior.  Denies fever.      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 Dela   Cataract    BILATERAL,REMOVED   Diverticulosis    Moderate   Female bladder prolapse    H/O cold sores    History of colon polyps    History of kidney stones    Hyperlipidemia    Migraine with aura    Osteopenia    prev on fosamax  for a few years then started evista at age ~43, T score improved  from -2.29/-1.96 to -1.8/-1.7 as of 06/2011, repeat DXA done 2015   Osteoporosis     Patient Active Problem List   Diagnosis Date Noted   Lung cancer (HCC) 01/07/2022   Pulmonary nodule 1 cm or greater in diameter 10/22/2021   Coronary artery calcification seen on CAT scan 10/07/2021   Leg pain, bilateral 07/26/2021   Radiculopathy 07/26/2021   Elevated blood pressure reading 07/26/2021   Bilateral sciatica 07/18/2021   Lower back pain 08/23/2019   Health care maintenance 08/09/2017   Cold sore 07/15/2015   Advance care planning 07/12/2014   Migraine with aura 03/28/2014   Medicare annual wellness visit, subsequent 07/03/2012   Osteoporosis 06/26/2011   HLD (hyperlipidemia) 04/27/2007   Allergy 04/27/2007    Past Surgical History:  Procedure Laterality Date   ABDOMINAL HYSTERECTOMY  Aug 2019 incl bladder repair   BREAST BIOPSY Left    benign   CATARACT EXTRACTION W/ INTRAOCULAR LENS  IMPLANT Left 11/2016   CATARACT EXTRACTION W/ INTRAOCULAR LENS IMPLANT Right 12/22/2016   COLONOSCOPY  08/15/2013   cyst on back     2 times   cyst on eyelid     right eye   CYSTOCELE REPAIR N/A 02/01/2018   Procedure: ANTERIOR REPAIR (CYSTOCELE);  Surgeon: Gaston Hamilton, MD;  Location: [facility];  Service: Urology;  Laterality: N/A;   CYSTOSCOPY N/A 02/01/2018   Procedure: cystoscopy;  Surgeon: Gaston Hamilton, MD;  Location: [facility];  Service: Urology;  Laterality: N/A;   EYE SURGERY  Cataracts 2018   FRACTURE SURGERY  Left wrist  2005   INTERCOSTAL NERVE BLOCK Right 01/07/2022   Procedure: INTERCOSTAL NERVE BLOCK;  Surgeon: Shyrl Linnie KIDD, MD;  Location: [facility];  Service: Thoracic;  Laterality: Right;   LAPAROSCOPIC VAGINAL HYSTERECTOMY WITH SALPINGO OOPHORECTOMY Bilateral 02/01/2018   Procedure: LAPAROSCOPIC ASSISTED VAGINAL HYSTERECTOMY WITH BILATERAL SALPINGO OOPHORECTOMY;  Surgeon: Rutherford Gain, MD;  Location: [facility];  Service: Gynecology;  Laterality: Bilateral;   LUNG LOBECTOMY     status post right upper lobectomy with lymph node dissection 01/07/2022.   NODE DISSECTION Right 01/07/2022   Procedure: NODE DISSECTION;  Surgeon: Shyrl Linnie KIDD, MD;  Location: [facility];  Service: Thoracic;  Laterality: Right;   SQUAMOUS  CELL CARCINOMA EXCISION     on back/removed 2 times/and thigh   SQUAMOUS CELL CARCINOMA EXCISION Left 07/15/2016   left lower calf   SSC removed  1988   Birthmark removal partial 1/3   TONSILLECTOMY  1950   TUBAL LIGATION  1980's    OB History   No obstetric history on file.      Home Medications    Prior to Admission medications  Medication Sig Start Date End Date Taking? Authorizing Provider  alendronate  (FOSAMAX ) 70 MG tablet Take 1 tablet (70 mg total) by mouth every 7 (seven) days. Take with a full glass of water on an empty stomach. 08/30/23   Cleatus Arlyss RAMAN, MD  Ascorbic Acid (VITAMIN C ) 1000 MG tablet Take 1 tablet (1,000 mg  total) by mouth in the morning and at bedtime. 08/28/22   Cleatus Arlyss RAMAN, MD  atorvastatin  (LIPITOR) 10 MG tablet Take 1 tablet (10 mg total) by mouth daily. 08/30/23   Cleatus Arlyss RAMAN, MD  b complex vitamins tablet Take 1 tablet by mouth daily.    [provider]  Calcium  Carbonate (CALCIUM  600 PO) Take 600 mg by mouth daily.    [provider]  Cholecalciferol (VITAMIN D  PO) Take 2,000 Units by mouth daily.    [provider]  CINNAMON PO Take 2,000 mg by mouth daily.    [provider]  Cyanocobalamin (B-12) 1000 MCG TBCR Take 2,000 mcg by mouth daily. 12/15/21   Cleatus Arlyss RAMAN, MD  fexofenadine  (ALLEGRA ) 180 MG tablet Take 1 tablet (180 mg total) by mouth as needed. Seasonal allergies from Spring to Fall 08/21/18   Cleatus Arlyss RAMAN, MD  Magnesium  250 MG TABS Take 250 mg by mouth daily.    [provider]  Melatonin 10 MG TABS Take 10 mg by mouth at bedtime as needed (sleep).    [provider]  Multiple Vitamin (MULTIVITAMIN) capsule Take 1 capsule by mouth daily. 08/21/18   Cleatus Arlyss RAMAN, MD  Turmeric 500 MG CAPS Take 500 mg by mouth daily.    [provider]  Ubiquinol 100 MG CAPS Take 100 mg by mouth daily. 08/21/18   Cleatus Arlyss RAMAN, MD  Zinc 50 MG CAPS Take 50 mg by mouth daily.    [provider]    Family History Family History  Problem Relation Age of Onset   Osteopenia Mother    Anxiety disorder Mother    Hearing loss Mother    Vision loss Mother    Hypertension Father    Heart disease Father        AFIB, PVD stents LE's, pacer   Stroke Father    Cancer Brother        prostate, prostatectomy, radiation 5 years later, Hormone Tx   Prostate cancer Brother    Heart disease Brother    Cancer Brother        prostate, prostatectomy   Prostate cancer Brother    Lung cancer Brother    Colon polyps Maternal Uncle    Depression Daughter    Diabetes Other    Cancer Other        Non-Hodgkin's Lymphoma    Anxiety disorder Daughter    Alcohol abuse Neg Hx    Drug abuse Neg Hx    Colon cancer Neg Hx    Breast cancer Neg Hx    Bladder Cancer Neg Hx    Kidney cancer Neg Hx    Crohn's disease Neg Hx  Esophageal cancer Neg Hx    Rectal cancer Neg Hx    Stomach cancer Neg Hx    Ulcerative colitis Neg Hx     Social History Social History[1]   Allergies   Codeine, Influenza vaccines, Tramadol  hcl, and Keflex  [cephalexin ]   Review of Systems Review of Systems  Constitutional: Negative.   HENT:  Positive for congestion and sore throat. Negative for dental problem, drooling, ear discharge, ear pain, facial swelling, hearing loss, mouth sores, nosebleeds, postnasal drip, rhinorrhea, sinus pressure, sinus pain, sneezing, tinnitus, trouble swallowing and voice change.   Respiratory:  Positive for cough. Negative for apnea, choking, chest tightness, shortness of breath, wheezing and stridor.      Physical Exam Triage Vital Signs ED Triage Vitals  Encounter Vitals Group     BP 06/09/24 0849 (!) 140/74     Girls Systolic BP Percentile --      Girls Diastolic BP Percentile --      Boys Systolic BP Percentile --      Boys Diastolic BP Percentile --      Pulse Rate 06/09/24 0849 79     Resp 06/09/24 0849 17     Temp 06/09/24 0849 98.3 F (36.8 C)     Temp Source 06/09/24 0849 Oral     SpO2 06/09/24 0849 95 %     Weight --      Height --      Head Circumference --      Peak Flow --      Pain Score 06/09/24 0853 3     Pain Loc --      Pain Education --      Exclude from Growth Chart --    No data found.  Updated Vital Signs BP (!) 140/74 (BP Location: Left Arm)   Pulse 79   Temp 98.3 F (36.8 C) (Oral)   Resp 17   SpO2 95%   Visual Acuity Right Eye Distance:   Left Eye Distance:   Bilateral Distance:    Right Eye Near:   Left Eye Near:    Bilateral Near:     Physical Exam Constitutional:      Appearance: Normal appearance.  HENT:     Head: Normocephalic.      Right Ear: Tympanic membrane, ear canal and external ear normal.     Left Ear: Tympanic membrane, ear canal and external ear normal.     Nose: Congestion present.     Mouth/Throat:     Pharynx: No oropharyngeal exudate or posterior oropharyngeal erythema.  Eyes:     Extraocular Movements: Extraocular movements intact.  Cardiovascular:     Rate and Rhythm: Normal rate and regular rhythm.     Pulses: Normal pulses.     Heart sounds: Normal heart sounds.  Pulmonary:     Effort: Pulmonary effort is normal.     Breath sounds: Normal breath sounds.  Neurological:     Mental Status: She is alert and oriented to person, place, and time. Mental status is at baseline.      UC Treatments / Results  Labs (all labs ordered are listed, but only abnormal results are displayed) Labs Reviewed - No data to display  EKG   Radiology No results found.  Procedures Procedures (including critical care time)  Medications Ordered in UC Medications - No data to display  Initial Impression / Assessment and Plan / UC Course  I have reviewed the triage vital signs and the nursing notes.  Pertinent  labs & imaging results that were available during my care of the patient were reviewed by me and considered in my medical decision making (see chart for details).  Acute URI  Patient is in no signs of distress nor toxic appearing.  Vital signs are stable.  Low suspicion for pneumonia, pneumothorax or bronchitis and therefore will defer imaging.  Viral testing deferred due to timeline, symptoms persisting for 7 days and has been experiencing new symptoms therefore empirically placed on antibiotics.  Prescribed azithromycin .May use additional over-the-counter medications as needed for supportive care.  May follow-up with urgent care as needed if symptoms persist or worsen.   Final Clinical Impressions(s) / UC Diagnoses   Final diagnoses:  None   Discharge Instructions   None    ED Prescriptions    None    PDMP not reviewed this encounter.     [1]  Social History Tobacco Use   Smoking status: Former    Current packs/day: 0.00    Average packs/day: 0.5 packs/day for 36.0 years (18.0 ttl pk-yrs)    Types: Cigarettes    Start date: 06/22/1965    Quit date: 06/22/2001    Years since quitting: 22.9   Smokeless tobacco: Never  Vaping Use   Vaping status: Never Used  Substance Use Topics   Alcohol use: Not Currently    Alcohol/week: 2.0 standard drinks of alcohol    Types: 2 Glasses of wine per week    Comment: occasionally   Drug use: No     Teresa Shelba SAUNDERS, NP 06/09/24 0908  "

## 2024-06-09 NOTE — ED Triage Notes (Signed)
 Patient reports dry cough and chest congestion x 1 week. Patient has been taking Robitussin and Mucinex with mild relief. Patient reports a sore throat that started yesterday. Rates pain 3/10.

## 2024-06-12 ENCOUNTER — Ambulatory Visit: Admitting: Family Medicine

## 2024-06-12 ENCOUNTER — Ambulatory Visit

## 2024-06-12 VITALS — BP 136/80 | HR 72 | Temp 99.0°F | Resp 18 | Ht 66.0 in | Wt 154.0 lb

## 2024-06-12 DIAGNOSIS — G9331 Postviral fatigue syndrome: Secondary | ICD-10-CM | POA: Diagnosis not present

## 2024-06-12 DIAGNOSIS — R051 Acute cough: Secondary | ICD-10-CM

## 2024-06-12 LAB — POC COVID19 BINAXNOW: SARS Coronavirus 2 Ag: NEGATIVE

## 2024-06-12 NOTE — Patient Instructions (Signed)
 Thank you for visiting [facility] today! Here's what we talked about: - Continue Robitussin for cough every 6 hours for the next few days then only as needed - Make appt with Dr Cleatus for physical - If fatigue/listlessness not improved in the next few weeks return for re-evaluation

## 2024-06-12 NOTE — Progress Notes (Signed)
 "  Subjective:   This visit was conducted in person. The patient gave informed consent to the use of [employer]bridge AI technology to record the contents of the encounter as documented below.   Patient ID: Desiree Ortega, female    DOB: 1945/10/09, 78 y.o.   MRN: [id number]   Discussed the use of AI scribe software for clinical note transcription with the patient, who gave verbal consent to proceed.  History of Present Illness Desiree Ortega is an 78 year old female with hx lung cancer who presents with persistent fatigue and cough following a recent respiratory infection.  She has been experiencing persistent fatigue and cough following a recent respiratory infection. She visited urgent care on June 09, 2024, where she was prescribed an antibiotic, which she is completing today. The cough has improved but persists, now described as drier than before. She experienced a severe coughing spasm while talking to a neighbor, but such episodes have decreased. No difficulty breathing, body aches, or muscle weakness, although she feels generally weak and attributes this to her recent illness. No weight loss, hemoptysis, or chest pain.  She expresses significant fatigue, feeling more tired than usual, and describes herself as 'sluggish' and having a 'foggy head.' No recent fever but mentions a headache located at the front of her face. She also reports feeling listless and lacking motivation, attributing some of this to recent stress due to her mother's passing. No significant changes in weight, with a slight fluctuation of two pounds recently, which she attributes to her eating habits.  Her past medical history includes lung cancer diagnosed two and a half years ago, for which she underwent surgical resection without chemotherapy or radiation. She is concerned about her lung health due to her cancer history. She has been using Robitussin for her cough, which she has not taken in the past day or two but  plans to resume as needed. She sleeps well and has not experienced any runny nose today.   Review of Systems  All other systems reviewed and are negative.       Allergies[1]  Medications Ordered Prior to Encounter[2]  BP 136/80 (BP Location: Left Arm, Patient Position: Sitting, Cuff Size: Normal)   Pulse 72   Temp 99 F (37.2 C) (Oral)   Resp 18   Ht 5' 6 (1.676 m)   Wt 154 lb (69.9 kg)   SpO2 95%   BMI 24.86 kg/m   Objective:      Physical Exam GENERAL: Alert, cooperative, well developed, no acute distress. HEAD: Normocephalic atraumatic. EYES: Extraocular movements intact BL, pupils round, equal and reactive to light BL, conjunctivae normal BL. THROAT: No oropharyngeal exudate or posterior oropharyngeal erythema. CARDIOVASCULAR: Normal heart rate and rhythm, S1 and S2 normal without murmurs. CHEST: Clear to auscultation bilaterally, no wheezes, rhonchi, or crackles. EXTREMITIES: No cyanosis or edema. NEUROLOGICAL: Oriented to person, place and time, no gait abnormalities, moves all extremities without gross motor or sensory deficit. Cranial nerves grossly intact. MUSCULOSKELETAL: Strength 5/5 in upper and lower extremities BL. PSYCH: Frequently tearful       Assessment & Plan:   Assessment & Plan Post viral fatigue Acute cough, resolving Fatigue likely due to post-viral syndrome given recent URI for which pt was just seen at [facility] 3d ago. Grief is another possible cause given complaint of listlessness, lack of motivation and recent stress from bereavement. Negative COVID test. Pt declines further symptoms, endorses cough is significantly improved, no new interventions at this time.  Per chart review, there is history of lung cancer, status post lobectomy, low suspicion for recurrence at this time given no hemoptysis, weight loss, as well as normal surveillance chest CT in Feb 2025, next CT scheduled for February 2026. no evidence of anemia, thyroid issues based on  recent labs.  Would hold off on repeating at this time.  - Continue Robitussin every six hours as needed for cough. - If fatigue persist or worsen, follow up with Doctor Cleatus for further evaluation, recommend CBCD, TSH and autoimmune labs. - Encouraged rest and self-care. - Discussed potential impact of stress on energy levels and mood. - Symptoms to be reevaluated at upcoming wellness visit with her PCP.   Return for worsening of symptoms or failure to improve.   Desiree Ortega K Aiyden Lauderback, MD  06/12/2024     Contains text generated by Abridge.        [1]  Allergies Allergen Reactions   Codeine Nausea Only   Influenza Vaccines     Local reaction to vaccine in 2014   Tramadol  Hcl Nausea Only    Patient doesn't want medication due to nausea   Keflex  [Cephalexin ] Rash    All around her torso area  [2]  Current Outpatient Medications on File Prior to Visit  Medication Sig Dispense Refill   alendronate  (FOSAMAX ) 70 MG tablet Take 1 tablet (70 mg total) by mouth every 7 (seven) days. Take with a full glass of water on an empty stomach. 13 tablet 3   Ascorbic Acid (VITAMIN C ) 1000 MG tablet Take 1 tablet (1,000 mg total) by mouth in the morning and at bedtime.     atorvastatin  (LIPITOR) 10 MG tablet Take 1 tablet (10 mg total) by mouth daily. 90 tablet 3   azithromycin  (ZITHROMAX ) 250 MG tablet Take 1 tablet (250 mg total) by mouth daily. Take first 2 tablets together, then 1 every day until finished. 6 tablet 0   b complex vitamins tablet Take 1 tablet by mouth daily.     Calcium  Carbonate (CALCIUM  600 PO) Take 600 mg by mouth daily.     Cholecalciferol (VITAMIN D  PO) Take 2,000 Units by mouth daily.     CINNAMON PO Take 2,000 mg by mouth daily.     Cyanocobalamin (B-12) 1000 MCG TBCR Take 2,000 mcg by mouth daily.     fexofenadine  (ALLEGRA ) 180 MG tablet Take 1 tablet (180 mg total) by mouth as needed. Seasonal allergies from Spring to Fall     Magnesium  250 MG TABS Take 250 mg by mouth  daily.     Melatonin 10 MG TABS Take 10 mg by mouth at bedtime as needed (sleep).     Multiple Vitamin (MULTIVITAMIN) capsule Take 1 capsule by mouth daily.     Turmeric 500 MG CAPS Take 500 mg by mouth daily.     Ubiquinol 100 MG CAPS Take 100 mg by mouth daily.     Zinc 50 MG CAPS Take 50 mg by mouth daily.     No current facility-administered medications on file prior to visit.   "

## 2024-06-13 ENCOUNTER — Ambulatory Visit: Payer: Self-pay

## 2024-07-31 ENCOUNTER — Ambulatory Visit (HOSPITAL_COMMUNITY): Payer: Medicare HMO

## 2024-07-31 ENCOUNTER — Inpatient Hospital Stay: Payer: Medicare HMO

## 2024-08-07 ENCOUNTER — Ambulatory Visit: Payer: Medicare HMO | Admitting: Internal Medicine

## 2024-08-31 ENCOUNTER — Ambulatory Visit

## 2024-09-01 ENCOUNTER — Ambulatory Visit

## 2024-09-04 ENCOUNTER — Encounter: Admitting: Family Medicine
